# Patient Record
Sex: Male | Born: 1940 | Race: White | Hispanic: No | Marital: Married | Smoking: Former smoker
Health system: Southern US, Community
[De-identification: ages and names within clinical notes are randomized; demographics above are authoritative.]

## PROBLEM LIST (undated history)

## (undated) DIAGNOSIS — M20019 Mallet finger of unspecified finger(s): Secondary | ICD-10-CM

## (undated) DIAGNOSIS — I4892 Unspecified atrial flutter: Secondary | ICD-10-CM

## (undated) DIAGNOSIS — M2550 Pain in unspecified joint: Secondary | ICD-10-CM

## (undated) DIAGNOSIS — N200 Calculus of kidney: Secondary | ICD-10-CM

## (undated) DIAGNOSIS — R079 Chest pain, unspecified: Secondary | ICD-10-CM

## (undated) DIAGNOSIS — S36119A Unspecified injury of liver, initial encounter: Secondary | ICD-10-CM

## (undated) DIAGNOSIS — M1990 Unspecified osteoarthritis, unspecified site: Secondary | ICD-10-CM

## (undated) DIAGNOSIS — C4490 Unspecified malignant neoplasm of skin, unspecified: Secondary | ICD-10-CM

## (undated) DIAGNOSIS — I10 Essential (primary) hypertension: Secondary | ICD-10-CM

## (undated) DIAGNOSIS — G4730 Sleep apnea, unspecified: Secondary | ICD-10-CM

## (undated) DIAGNOSIS — B159 Hepatitis A without hepatic coma: Secondary | ICD-10-CM

## (undated) DIAGNOSIS — H2513 Age-related nuclear cataract, bilateral: Secondary | ICD-10-CM

## (undated) DIAGNOSIS — G2581 Restless legs syndrome: Secondary | ICD-10-CM

## (undated) DIAGNOSIS — N400 Enlarged prostate without lower urinary tract symptoms: Secondary | ICD-10-CM

## (undated) DIAGNOSIS — F1010 Alcohol abuse, uncomplicated: Secondary | ICD-10-CM

## (undated) DIAGNOSIS — K635 Polyp of colon: Secondary | ICD-10-CM

## (undated) DIAGNOSIS — D649 Anemia, unspecified: Secondary | ICD-10-CM

## (undated) HISTORY — DX: Unspecified osteoarthritis, unspecified site: M19.90

## (undated) HISTORY — PX: OTHER SURGICAL HISTORY: SHX169

## (undated) HISTORY — DX: Anemia, unspecified: D64.9

## (undated) HISTORY — DX: Benign prostatic hyperplasia without lower urinary tract symptoms: N40.0

## (undated) HISTORY — DX: Sleep apnea, unspecified: G47.30

## (undated) HISTORY — DX: Age-related nuclear cataract, bilateral: H25.13

## (undated) HISTORY — DX: Essential (primary) hypertension: I10

## (undated) HISTORY — DX: Polyp of colon: K63.5

## (undated) HISTORY — DX: Restless legs syndrome: G25.81

## (undated) HISTORY — DX: Unspecified atrial flutter: I48.92

## (undated) HISTORY — DX: Chest pain, unspecified: R07.9

## (undated) HISTORY — PX: POLYPECTOMY: SHX149

## (undated) HISTORY — DX: Mallet finger of unspecified finger(s): M20.019

## (undated) HISTORY — DX: Pain in unspecified joint: M25.50

## (undated) HISTORY — PX: WISDOM TOOTH EXTRACTION: SHX21

## (undated) HISTORY — PX: EYE SURGERY: SHX253

## (undated) HISTORY — DX: Calculus of kidney: N20.0

## (undated) HISTORY — DX: Unspecified malignant neoplasm of skin, unspecified: C44.90

## (undated) HISTORY — DX: Alcohol abuse, uncomplicated: F10.10

## (undated) HISTORY — PX: INGUINAL HERNIA REPAIR: SUR1180

---
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
## 2005-02-20 ENCOUNTER — Ambulatory Visit: Payer: Self-pay | Admitting: Internal Medicine

## 2005-03-19 ENCOUNTER — Ambulatory Visit: Payer: Self-pay | Admitting: Internal Medicine

## 2005-09-25 ENCOUNTER — Ambulatory Visit: Payer: Self-pay | Admitting: Internal Medicine

## 2006-04-02 ENCOUNTER — Ambulatory Visit: Payer: Self-pay | Admitting: Internal Medicine

## 2006-12-23 DIAGNOSIS — L719 Rosacea, unspecified: Secondary | ICD-10-CM | POA: Insufficient documentation

## 2006-12-23 DIAGNOSIS — Z860100 Personal history of colon polyps, unspecified: Secondary | ICD-10-CM

## 2006-12-23 DIAGNOSIS — M722 Plantar fascial fibromatosis: Secondary | ICD-10-CM

## 2006-12-23 DIAGNOSIS — J309 Allergic rhinitis, unspecified: Secondary | ICD-10-CM | POA: Insufficient documentation

## 2006-12-23 HISTORY — DX: Allergic rhinitis, unspecified: J30.9

## 2006-12-23 HISTORY — DX: Personal history of colonic polyps: Z86.010

## 2006-12-23 HISTORY — DX: Rosacea, unspecified: L71.9

## 2006-12-23 HISTORY — DX: Personal history of colon polyps, unspecified: Z86.0100

## 2007-07-02 ENCOUNTER — Encounter: Admission: RE | Admit: 2007-07-02 | Discharge: 2007-07-02 | Payer: Self-pay | Admitting: Orthopaedic Surgery

## 2007-07-26 ENCOUNTER — Telehealth (INDEPENDENT_AMBULATORY_CARE_PROVIDER_SITE_OTHER): Payer: Self-pay | Admitting: *Deleted

## 2007-09-21 ENCOUNTER — Ambulatory Visit: Payer: Self-pay | Admitting: Internal Medicine

## 2007-09-21 DIAGNOSIS — Z87442 Personal history of urinary calculi: Secondary | ICD-10-CM

## 2007-09-21 DIAGNOSIS — I10 Essential (primary) hypertension: Secondary | ICD-10-CM | POA: Insufficient documentation

## 2007-09-21 DIAGNOSIS — E782 Mixed hyperlipidemia: Secondary | ICD-10-CM

## 2007-09-21 HISTORY — DX: Personal history of urinary calculi: Z87.442

## 2007-09-21 HISTORY — DX: Essential (primary) hypertension: I10

## 2007-09-21 HISTORY — DX: Mixed hyperlipidemia: E78.2

## 2007-09-23 ENCOUNTER — Encounter: Payer: Self-pay | Admitting: Internal Medicine

## 2007-10-06 ENCOUNTER — Ambulatory Visit: Payer: Self-pay | Admitting: Internal Medicine

## 2007-10-12 ENCOUNTER — Encounter (INDEPENDENT_AMBULATORY_CARE_PROVIDER_SITE_OTHER): Payer: Self-pay | Admitting: *Deleted

## 2007-10-12 LAB — CONVERTED CEMR LAB
ALT: 19 units/L (ref 0–53)
AST: 20 units/L (ref 0–37)
Albumin: 3.9 g/dL (ref 3.5–5.2)
Alkaline Phosphatase: 55 units/L (ref 39–117)
Bilirubin, Direct: 0.2 mg/dL (ref 0.0–0.3)
Calcium: 9.1 mg/dL (ref 8.4–10.5)
Chloride: 110 meq/L (ref 96–112)
Cholesterol: 190 mg/dL (ref 0–200)
Creatinine, Ser: 1 mg/dL (ref 0.4–1.5)
GFR calc Af Amer: 97 mL/min
GFR calc non Af Amer: 80 mL/min
Total Bilirubin: 0.8 mg/dL (ref 0.3–1.2)
Total CHOL/HDL Ratio: 2.7
Total Protein: 6.2 g/dL (ref 6.0–8.3)
VLDL: 6 mg/dL (ref 0–40)

## 2008-03-14 ENCOUNTER — Encounter: Payer: Self-pay | Admitting: Internal Medicine

## 2008-05-08 ENCOUNTER — Telehealth (INDEPENDENT_AMBULATORY_CARE_PROVIDER_SITE_OTHER): Payer: Self-pay | Admitting: *Deleted

## 2008-10-13 ENCOUNTER — Telehealth (INDEPENDENT_AMBULATORY_CARE_PROVIDER_SITE_OTHER): Payer: Self-pay | Admitting: *Deleted

## 2008-11-09 ENCOUNTER — Ambulatory Visit: Payer: Self-pay | Admitting: Internal Medicine

## 2008-11-09 DIAGNOSIS — N400 Enlarged prostate without lower urinary tract symptoms: Secondary | ICD-10-CM

## 2008-11-09 DIAGNOSIS — K5730 Diverticulosis of large intestine without perforation or abscess without bleeding: Secondary | ICD-10-CM

## 2008-11-09 HISTORY — DX: Benign prostatic hyperplasia without lower urinary tract symptoms: N40.0

## 2008-11-09 HISTORY — DX: Diverticulosis of large intestine without perforation or abscess without bleeding: K57.30

## 2008-11-10 ENCOUNTER — Ambulatory Visit: Payer: Self-pay | Admitting: Internal Medicine

## 2008-11-20 ENCOUNTER — Encounter (INDEPENDENT_AMBULATORY_CARE_PROVIDER_SITE_OTHER): Payer: Self-pay | Admitting: *Deleted

## 2009-05-29 ENCOUNTER — Emergency Department (HOSPITAL_COMMUNITY): Admission: EM | Admit: 2009-05-29 | Discharge: 2009-05-29 | Payer: Self-pay | Admitting: Emergency Medicine

## 2010-05-06 ENCOUNTER — Telehealth (INDEPENDENT_AMBULATORY_CARE_PROVIDER_SITE_OTHER): Payer: Self-pay | Admitting: *Deleted

## 2010-05-09 ENCOUNTER — Ambulatory Visit: Payer: Self-pay | Admitting: Internal Medicine

## 2010-05-09 ENCOUNTER — Encounter: Payer: Self-pay | Admitting: Internal Medicine

## 2010-05-17 LAB — CONVERTED CEMR LAB
ALT: 18 units/L (ref 0–53)
AST: 21 units/L (ref 0–37)
Albumin: 4.4 g/dL (ref 3.5–5.2)
Alkaline Phosphatase: 61 units/L (ref 39–117)
BUN: 14 mg/dL (ref 6–23)
Calcium: 9.2 mg/dL (ref 8.4–10.5)
Creatinine, Ser: 1 mg/dL (ref 0.4–1.5)
Eosinophils Absolute: 0.3 10*3/uL (ref 0.0–0.7)
Eosinophils Relative: 3.8 % (ref 0.0–5.0)
HCT: 46.1 % (ref 39.0–52.0)
Hemoglobin: 15.7 g/dL (ref 13.0–17.0)
LDL Cholesterol: 111 mg/dL — ABNORMAL HIGH (ref 0–99)
Lymphs Abs: 1.6 10*3/uL (ref 0.7–4.0)
Monocytes Relative: 12.9 % — ABNORMAL HIGH (ref 3.0–12.0)
PSA: 0.92 ng/mL (ref 0.10–4.00)
RBC: 5.11 M/uL (ref 4.22–5.81)
TSH: 1.05 microintl units/mL (ref 0.35–5.50)
Triglycerides: 35 mg/dL (ref 0.0–149.0)
VLDL: 7 mg/dL (ref 0.0–40.0)

## 2010-07-28 DIAGNOSIS — M20019 Mallet finger of unspecified finger(s): Secondary | ICD-10-CM | POA: Insufficient documentation

## 2010-07-28 HISTORY — DX: Mallet finger of unspecified finger(s): M20.019

## 2010-08-25 LAB — CONVERTED CEMR LAB
Albumin: 4.1 g/dL (ref 3.5–5.2)
BUN: 13 mg/dL (ref 6–23)
Creatinine, Ser: 1 mg/dL (ref 0.4–1.5)
HDL: 70 mg/dL (ref >39.00)
PSA: 0.72 ng/mL (ref 0.10–4.00)
Potassium: 4 meq/L (ref 3.5–5.1)
Total CHOL/HDL Ratio: 3
VLDL: 5.4 mg/dL (ref 0.0–40.0)

## 2010-08-29 NOTE — Progress Notes (Signed)
Summary: RX/ Appointment Due  Phone Note Call from Patient Call back at [phone]   Caller: Patient Summary of Call: PT IS CALLING  FOR A 90 DAY  SUPPLY FOR RAMPRIL 10 BE PICK UP WHEN READY. Initial call taken by: Freddy Jaksch,  May 06, 2010 9:52 AM  Follow-up for Phone Call        Last Appointment 10/2008(Over a year ago), please contact patient to schedule CPX/Fasting labs. 90day supply can be given at CPX appointment Follow-up by: Shonna Chock CMA,  May 06, 2010 10:52 AM  Additional Follow-up for Phone Call Additional follow up Details #1::        LMOM.Marland KitchenMarland KitchenJerolyn Shin  May 07, 2010 4:53 PM    Additional Follow-up for Phone Call Additional follow up Details #2::    Patient here today for CPX Follow-up by: Shonna Chock CMA,  May 09, 2010 11:04 AM  [id number]

## 2010-08-29 NOTE — Assessment & Plan Note (Signed)
Summary: med refill / lab/cbs   Vital Signs:  Patient profile:   70 year old male Height:      70.25 inches Weight:      246.8 pounds BMI:     35.29 Temp:     98.3 degrees F oral Pulse rate:   72 / minute Resp:     14 per minute BP sitting:   114 / 72  (left arm) Cuff size:   large  Vitals Entered By: Shonna Chock CMA (May 09, 2010 11:09 AM) CC: CPX with fasting labs  Comments Height recorded with shoes on Patient updated Flu vaccine this season   CC:  CPX with fasting labs .  History of Present Illness:      Mr. Vonita Moss is here for med refills; he has been having positionally related( "after sleeping in semi reclining position > 3 nights to treat sinus congestion  ") burning  in anterior thighs. Hypertension Follow-Up      The patient denies lightheadedness, urinary frequency, headaches, and fatigue.  The patient denies the following associated symptoms: chest pain, chest pressure, exercise intolerance, dyspnea, palpitations, syncope, leg edema, and pedal edema.  Compliance with medications (by patient report) has been near 100%.  The patient reports that dietary compliance has been fair.  The patient reports exercising 3-4X per week.  Adjunctive measures currently used by the patient include salt restriction. BP  averages 128/75.   Current Medications (verified): 1)  Clonazepam 0.5 Mg Tabs (Clonazepam) .Marland Kitchen.. 1 By Mouth At Bedtime As Needed Needs Office Visit 2)  Altace 10 Mg  Caps (Ramipril) .... Take One Tablet Daily- Due For Cpx and Labs Now 3)  Zyrtec Allergy 10 Mg  Tabs (Cetirizine Hcl) .Marland Kitchen.. 1 By Mouth Qd 4)  Protopic 0.1 % Oint (Tacrolimus) .Marland Kitchen.. 1 Application At Bedtime 5)  Metrocream 0.75 % Crea (Metronidazole) .Marland Kitchen.. 1 Application Am 6)  Cvs Spectrarite Senior W/lycopene (Spectrum Silver) .Marland Kitchen.. 1 By Mouth Once Daily 7)  Cvs Vision Formula (Ocurite) .Marland Kitchen.. 1 By Mouth Once Daily 8)  Align  Caps (Probiotic Product) .Marland Kitchen.. 1 By Mouth Once Daily 9)  Fish Oil 1000 Mg Caps  (Omega-3 Fatty Acids) .Marland Kitchen.. 1 By Mouth Once Daily 10)  Red Yeast Rice 600 Mg Caps (Red Yeast Rice Extract) .Marland Kitchen.. 1 By Mouth Once Daily 11)  Metamucil Inulin Fiber in Food .Marland Kitchen.. 1-3 Per Day 12)  Fiber Con Cap .... 2 Once Daily  Allergies: 1)  ! Pcn  Past History:  Past Medical History: Allergic rhinitis Colonic polyps,PMH  of,2003,2006,2009, Dr Medoff(due 2014) Hypertension Nephrolithiasis, PMH  of Benign prostatic hypertrophy Diverticulosis, colon NMR Liporofile LDL 108( 1072/575), HDL 63, TG 40. LDL goal = < 130.  Past Surgical History: Colon polypectomy X3 , Dr Kinnie Scales  Inguinal herniorrhaphy bilaterally  Wisdom Teeth extraction  Family History: Father: DM,AAA, renal calculi Mother: CVA,diverticular bleed ?,HTN Siblings: bro: hepatitis C, hepatic failure, S/P transplant:; son :renal calculi; sister: peritonitis post ruptured appendix  Social History: Former Smoker: quit 1978 Alcohol use-no Regular exercise-yes: >3X/week CVE  Occupation:Financial Analyst Married  Review of Systems  The patient denies anorexia, fever, vision loss, decreased hearing, hoarseness, prolonged cough, hemoptysis, abdominal pain, melena, hematochezia, severe indigestion/heartburn, hematuria, suspicious skin lesions, depression, unusual weight change, abnormal bleeding, enlarged lymph nodes, and angioedema.         Swelling of tongure > 1 year ago X 1, ? due to vitamins.No recurrence despite contnuing ACE-I (Ramipril)   Physical Exam  General:  well-nourished; alert,appropriate and  cooperative throughout examination Head:  Normocephalic and atraumatic without obvious abnormalities. No apparent alopecia  Eyes:  No corneal or conjunctival inflammation noted. Perrla. Funduscopic exam benign, without hemorrhages, exudates or papilledema.  Ears:  External ear exam shows no significant lesions or deformities.  Otoscopic examination reveals clear canals, tympanic membranes are intact bilaterally without  bulging, retraction, inflammation or discharge. Hearing is grossly normal bilaterally. Nose:  External nasal examination shows no deformity or inflammation. Nasal mucosa are pink and moist without lesions or exudates. Mouth:  Oral mucosa and oropharynx without lesions or exudates.  Teeth in good repair. Neck:  No deformities, masses, or tenderness noted. Lungs:  Normal respiratory effort, chest expands symmetrically. Lungs are clear to auscultation, no crackles or wheezes. Heart:  Normal rate and regular rhythm. S1 and S2 normal without gallop, murmur, click, rub.S4 Abdomen:  Bowel sounds positive,abdomen soft and non-tender without masses, organomegaly or hernias noted. Rectal:   Normal sphincter tone. No rectal masses or tenderness.External hemorrhoidal tags. Genitalia:  Testes bilaterally descended without nodularity, tenderness or masses. No scrotal masses or lesions. No penis lesions or urethral discharge. L varicocele.   Prostate:  Prostate gland firm and smooth, no enlargement, nodularity, tenderness, mass, asymmetry or induration. Msk:  No deformity or scoliosis noted of thoracic or lumbar spine.   Pulses:  R and L carotid,radial,dorsalis pedis and posterior tibial pulses are full and equal bilaterally Extremities:  No clubbing, cyanosis, edema, or deformity noted with normal full range of motion of all joints.   Neurologic:  alert & oriented X3 and DTRs symmetrical and normal.   Skin:  Intact without suspicious lesions or rashes Cervical Nodes:  No lymphadenopathy noted Axillary Nodes:  No palpable lymphadenopathy Psych:  memory intact for recent and remote, normally interactive, and good eye contact.     Impression & Recommendations:  Problem # 1:  ROUTINE GENERAL MEDICAL EXAM@[email or web address]  CARE FACL (ICD-V70.0)  Orders: EKG w/ Interpretation (93000) Venipuncture (04540) TLB-Lipid Panel (80061-LIPID) TLB-BMP (Basic Metabolic Panel-BMET) (80048-METABOL) TLB-CBC Platelet -  w/Differential (85025-CBCD) TLB-Hepatic/Liver Function Pnl (80076-HEPATIC) TLB-TSH (Thyroid Stimulating Hormone) (84443-TSH) TLB-PSA (Prostate Specific Antigen) (84153-PSA)  Problem # 2:  HYPERTENSION, ESSENTIAL NOS (ICD-401.9)  His updated medication list for this problem includes:    Altace 10 Mg Caps (Ramipril) .Marland Kitchen... Take one tablet daily- due for cpx and labs now  Problem # 3:  COLONIC POLYPS, HX OF (ICD-V12.72) as per Dr Kinnie Scales  Complete Medication List: 1)  Clonazepam 0.5 Mg Tabs (Clonazepam) .Marland Kitchen.. 1 by mouth at bedtime as needed needs office visit 2)  Altace 10 Mg Caps (Ramipril) .... Take one tablet daily- due for cpx and labs now 3)  Zyrtec Allergy 10 Mg Tabs (Cetirizine hcl) .Marland Kitchen.. 1 by mouth qd 4)  Protopic 0.1 % Oint (Tacrolimus) .Marland Kitchen.. 1 application at bedtime 5)  Metrocream 0.75 % Crea (Metronidazole) .Marland Kitchen.. 1 application am 6)  Cvs Chief Strategy Officer W/lycopene (spectrum Silver)  .Marland Kitchen.. 1 by mouth once daily 7)  Cvs Vision Formula (ocurite)  .Marland Kitchen.. 1 by mouth once daily 8)  Align Caps (Probiotic product) .Marland Kitchen.. 1 by mouth once daily 9)  Fish Oil 1000 Mg Caps (Omega-3 fatty acids) .Marland Kitchen.. 1 by mouth once daily 10)  Red Yeast Rice 600 Mg Caps (Red yeast rice extract) .Marland Kitchen.. 1 by mouth once daily 11)  Metamucil Inulin Fiber in Food  .Marland Kitchen.. 1-3 per day 12)  Fiber Con Cap  .... 2 once daily  Other Orders: Pneumococcal Vaccine (98119) Admin 1st Vaccine (14782)  Patient Instructions: 1)  Check your Blood Pressure regularly. If it is above: 135/85 ON AVERAGE  you should make an appointment.   Immunizations Administered:  Pneumonia Vaccine:    Vaccine Type: Pneumovax    Site: left deltoid    Mfr: Merck    Dose: 0.5 ml    Route: IM    Given by: Shonna Chock CMA    Exp. Date: 10/14/2011    Lot #: [id number]    VIS given: 07/02/09 version given May 09, 2010.  Appended Document: med refill / lab/cbs     Allergies: 1)  ! Pcn   Complete Medication List: 1)  Clonazepam 0.5 Mg Tabs  (Clonazepam) .Marland Kitchen.. 1 by mouth at bedtime as needed 2)  Altace 10 Mg Caps (Ramipril) .... Take one tablet daily- due for cpx and labs now 3)  Zyrtec Allergy 10 Mg Tabs (Cetirizine hcl) .Marland Kitchen.. 1 by mouth qd 4)  Protopic 0.1 % Oint (Tacrolimus) .Marland Kitchen.. 1 application at bedtime 5)  Metrocream 0.75 % Crea (Metronidazole) .Marland Kitchen.. 1 application am 6)  Cvs Chief Strategy Officer W/lycopene (spectrum Silver)  .Marland Kitchen.. 1 by mouth once daily 7)  Cvs Vision Formula (ocurite)  .Marland Kitchen.. 1 by mouth once daily 8)  Align Caps (Probiotic product) .Marland Kitchen.. 1 by mouth once daily 9)  Fish Oil 1000 Mg Caps (Omega-3 fatty acids) .Marland Kitchen.. 1 by mouth once daily 10)  Red Yeast Rice 600 Mg Caps (Red yeast rice extract) .Marland Kitchen.. 1 by mouth once daily 11)  Metamucil Inulin Fiber in Food  .Marland Kitchen.. 1-3 per day 12)  Fiber Con Cap  .... 2 once daily  Other Orders: Specimen Handling (14782)

## 2010-08-29 NOTE — Progress Notes (Signed)
Summary: Med List Brought by Patient  Med List Brought by Patient   Imported By: Lanelle Bal 05/16/2010 15:46:48  _____________________________________________________________________  External Attachment:    Type:   Image     Comment:   External Document

## 2011-02-21 ENCOUNTER — Encounter: Payer: Self-pay | Admitting: Internal Medicine

## 2011-02-24 ENCOUNTER — Ambulatory Visit (INDEPENDENT_AMBULATORY_CARE_PROVIDER_SITE_OTHER): Payer: Federal, State, Local not specified - PPO | Admitting: Internal Medicine

## 2011-02-24 ENCOUNTER — Encounter: Payer: Self-pay | Admitting: Internal Medicine

## 2011-02-24 DIAGNOSIS — R1013 Epigastric pain: Secondary | ICD-10-CM

## 2011-02-24 DIAGNOSIS — Z86010 Personal history of colonic polyps: Secondary | ICD-10-CM

## 2011-02-24 DIAGNOSIS — K5730 Diverticulosis of large intestine without perforation or abscess without bleeding: Secondary | ICD-10-CM

## 2011-02-24 LAB — CBC WITH DIFFERENTIAL/PLATELET
Eosinophils Absolute: 0.2 10*3/uL (ref 0.0–0.7)
Hemoglobin: 15.1 g/dL (ref 13.0–17.0)
Lymphs Abs: 1.2 10*3/uL (ref 0.7–4.0)
MCHC: 33.8 g/dL (ref 30.0–36.0)
MCV: 89.9 fl (ref 78.0–100.0)
Monocytes Relative: 11.4 % (ref 3.0–12.0)
Platelets: 226 10*3/uL (ref 150.0–400.0)
RBC: 4.98 Mil/uL (ref 4.22–5.81)
RDW: 13.1 % (ref 11.5–14.6)

## 2011-02-24 LAB — HEPATIC FUNCTION PANEL
AST: 19 U/L (ref 0–37)
Albumin: 4.6 g/dL (ref 3.5–5.2)
Alkaline Phosphatase: 61 U/L (ref 39–117)
Bilirubin, Direct: 0.1 mg/dL (ref 0.0–0.3)
Total Protein: 7.1 g/dL (ref 6.0–8.3)

## 2011-02-24 MED ORDER — RANITIDINE HCL 150 MG PO TABS: 150.0000 mg | ORAL_TABLET | Freq: Two times a day (BID) | ORAL | Status: DC

## 2011-02-24 NOTE — Patient Instructions (Addendum)
The triggers for dyspepsia or "heart burn"  include stress; the "aspirin family" ; alcohol; peppermint; and caffeine (coffee, tea, cola, and chocolate). The aspirin family would include aspirin and the nonsteroidal agents such as ibuprofen &  Naproxen. Tylenol would not cause reflux. If having dyspepsia ; food & drink should be avoided for @ least 2 hours before going to bed. Please complete stool cards.

## 2011-02-24 NOTE — Progress Notes (Signed)
  Subjective:    Patient ID: TEXAS SOUTER, male    DOB: 18-Oct-1940, 70 y.o.   MRN: [id number]  HPI ABDOMINAL PAIN: Location: epigatric  Onset: 6  mos ago after eating pizza, followed by  diarrhea   Radiation: no  Severity:up to 5 Quality: throbbing  Duration: hours  Better with: BM  Worse with: certain spicey foods Symptoms: Nausea/Vomiting: no  Diarrhea: no, but increased BMs  Constipation: yes, but not associated with episodes  Melena/BRBPR: no  Hematemesis: no  Anorexia: no  Fever/Chills: no  Dysuria/hematuria/ pyuria no  Wt loss: yes, purposeful  EtOH use: no  NSAIDs/ASA: no   Past Surgeries: last colonoscopy 2010 : polyps & diverticuli; no upper ENDO FH: DUD in both parents; his mother's ulcer caused death (? NSAIDS related). No FH GB disease.     Review of Systems     Objective:   Physical Exam General appearance is one of good health and nourishment w/o distress.  Eyes: No conjunctival inflammation or scleral icterus is present.  Oral exam: Dental hygiene is good; lips and gums are healthy appearing.There is no oropharyngeal erythema or exudate noted.   Heart:  Slow rate and regular rhythm. S1 and S2 normal without gallop, murmur, click, rub . S4 with slurring   Lungs:Chest clear to auscultation; no wheezes, rhonchi,rales ,or rubs present.No increased work of breathing.   Abdomen: bowel sounds normal, soft and non-tender without masses, organomegaly or hernias noted.  No guarding or rebound   Skin:Warm & dry.  Intact without suspicious lesions or rashes ; no jaundice or tenting  Lymphatic: No lymphadenopathy is noted about the head, neck, axilla             Assessment & Plan:   #1 abdominal pain, epigastric, postprandial. Most likely gastric in etiology; rule out gallbladder disease. FH ulcer disease  Plan: See orders inpatient recommendations.

## 2011-03-06 ENCOUNTER — Other Ambulatory Visit: Payer: Federal, State, Local not specified - PPO

## 2011-03-06 DIAGNOSIS — Z1211 Encounter for screening for malignant neoplasm of colon: Secondary | ICD-10-CM

## 2011-03-06 LAB — HEMOCCULT GUIAC POC 1CARD (OFFICE): Card #2 Fecal Occult Blod, POC: NEGATIVE

## 2011-03-06 NOTE — Progress Notes (Signed)
Labs only

## 2011-05-11 ENCOUNTER — Other Ambulatory Visit: Payer: Self-pay | Admitting: Internal Medicine

## 2011-05-13 ENCOUNTER — Encounter: Payer: Self-pay | Admitting: Internal Medicine

## 2011-05-13 ENCOUNTER — Ambulatory Visit (INDEPENDENT_AMBULATORY_CARE_PROVIDER_SITE_OTHER): Payer: Federal, State, Local not specified - PPO | Admitting: Internal Medicine

## 2011-05-13 DIAGNOSIS — S99919A Unspecified injury of unspecified ankle, initial encounter: Secondary | ICD-10-CM

## 2011-05-13 DIAGNOSIS — J209 Acute bronchitis, unspecified: Secondary | ICD-10-CM

## 2011-05-13 DIAGNOSIS — S99911A Unspecified injury of right ankle, initial encounter: Secondary | ICD-10-CM

## 2011-05-13 DIAGNOSIS — J069 Acute upper respiratory infection, unspecified: Secondary | ICD-10-CM

## 2011-05-13 DIAGNOSIS — K219 Gastro-esophageal reflux disease without esophagitis: Secondary | ICD-10-CM

## 2011-05-13 MED ORDER — AZITHROMYCIN 250 MG PO TABS: ORAL_TABLET | ORAL | Status: AC

## 2011-05-13 MED ORDER — HYDROCODONE-HOMATROPINE 5-1.5 MG/5ML PO SYRP: 5.0000 mL | ORAL_SOLUTION | Freq: Four times a day (QID) | ORAL | Status: AC | PRN

## 2011-05-13 NOTE — Progress Notes (Signed)
  Subjective:    Patient ID: Gregory Wilkins, male    DOB: 03/05/1941, 70 y.o.   MRN: [id number]  HPI  #1 Respiratory tract infection Onset/symptoms:10/10 as ST Exposures (illness/environmental/extrinsic):none definitely Progression of symptoms:to head congestion Treatments/response:Mucinex, Zyrtec , Tessalon Perles Present symptoms: Fever/chills/sweats:no Frontal headache:no Facial pain:yes Nasal purulence:no, all " white" Sore throat:resolved Dental pain:no Lymphadenopathy:no Wheezing/shortness of breath:some wheezing Cough/sputum/hemoptysis:white, foamy Associated extrinsic/allergic symptoms:itchy eyes/ sneezing:paroxysmal sneezing Past medical history: Seasonal allergies: yes/asthma:not definitely Smoking history:quit 1984  #2 Extremity pain Location:R ankle Onset:misstep off sidewalk 8 weeks ago Pain quality:sharp, intermittent Pain severity:up to 2 Radiation:no Exacerbating factors:no  Treatment/response:RICE initially Review of systems: Constitutional: no fever, chills, sweats, change in weight  Musculoskeletal:no  muscle cramps or pain; no  joint stiffness, redness. Slight  swelling Skin:slight discoloration Neuro: no numbness and tingling Heme:some  bruising post injury  #3 Abdominal pain: NRanitidine has been benefit. He denies abdominal pain ,melena or rectal bleeding.          Review of Systems     Objective:   Physical Exam General appearance is of good health and nourishment; no acute distress or increased work of breathing is present.  No  lymphadenopathy about the head, neck, or axilla noted.   Eyes: No conjunctival inflammation or lid edema is present.   Ears:  External ear exam shows no significant lesions or deformities.  Otoscopic examination reveals clear canals, tympanic membranes are intact bilaterally without bulging, retraction, inflammation or discharge.  Nose:  External nasal examination shows no deformity or inflammation. Nasal  mucosa are pink and moist without lesions or exudates. No septal dislocation .No obstruction to airflow. Hyponasal speech  Oral exam: Dental hygiene is good; lips and gums are healthy appearing.There is no oropharyngeal erythema or exudate noted.   Slightly hoarse  Heart:  Normal rate and regular rhythm. S1 and S2 normal without gallop, murmur, click, rub .S 4 Lungs:Chest clear to auscultation; no wheezes, rhonchi,rales ,or rubs present.No increased work of breathing.   Dry cough  Abdomen: No organomegaly, masses, or tenderness  Extremities:  No cyanosis or clubbing  noted . Edema is noted at the R  ankle, greatest at the left lateral malleolar area. With range of motion he  has discomfort over the medial distal she   Skin: Warm & dry w/o jaundice or tenting.          Assessment & Plan:  #1 respiratory tract infection, atypical suggested by clear secretions. Cough is disturbing sleep  #2 significant strain of the right ankle with residual swelling and pain.  #3 esophageal reflux, improved with an H2 blocker.  Plan: See orders and recommendations.

## 2011-05-13 NOTE — Patient Instructions (Signed)
Plain Mucinex for thick secretions ;force NON dairy fluids for next 48 hrs. Use a Neti pot daily as needed for sinus congestion 

## 2011-05-16 ENCOUNTER — Other Ambulatory Visit: Payer: Self-pay | Admitting: Internal Medicine

## 2012-02-01 ENCOUNTER — Other Ambulatory Visit: Payer: Self-pay | Admitting: Internal Medicine

## 2012-02-02 NOTE — Telephone Encounter (Signed)
Patient needs to schedule a CPX  

## 2012-04-25 ENCOUNTER — Other Ambulatory Visit: Payer: Self-pay | Admitting: Internal Medicine

## 2012-04-26 NOTE — Telephone Encounter (Signed)
Please schedule an OV       KP

## 2012-04-26 NOTE — Telephone Encounter (Signed)
Refill #90; last OV 7/12. Please  schedule  follow up appt in next 60-90 days

## 2012-04-26 NOTE — Telephone Encounter (Signed)
Pt last seen 05/13/11, labs 03/06/11. Last filled #90 refill 0.    MW

## 2012-04-30 NOTE — Telephone Encounter (Signed)
Pt scheduled  

## 2012-04-30 NOTE — Telephone Encounter (Signed)
lmovm

## 2012-06-30 ENCOUNTER — Ambulatory Visit (INDEPENDENT_AMBULATORY_CARE_PROVIDER_SITE_OTHER): Payer: Federal, State, Local not specified - PPO | Admitting: Internal Medicine

## 2012-06-30 ENCOUNTER — Encounter: Payer: Self-pay | Admitting: Internal Medicine

## 2012-06-30 VITALS — BP 124/80 | HR 68 | Wt 255.4 lb

## 2012-06-30 DIAGNOSIS — Z85828 Personal history of other malignant neoplasm of skin: Secondary | ICD-10-CM | POA: Insufficient documentation

## 2012-06-30 DIAGNOSIS — G2581 Restless legs syndrome: Secondary | ICD-10-CM | POA: Insufficient documentation

## 2012-06-30 DIAGNOSIS — Z23 Encounter for immunization: Secondary | ICD-10-CM

## 2012-06-30 DIAGNOSIS — Z831 Family history of other infectious and parasitic diseases: Secondary | ICD-10-CM

## 2012-06-30 DIAGNOSIS — I10 Essential (primary) hypertension: Secondary | ICD-10-CM

## 2012-06-30 HISTORY — DX: Personal history of other malignant neoplasm of skin: Z85.828

## 2012-06-30 HISTORY — DX: Family history of other infectious and parasitic diseases: Z83.1

## 2012-06-30 LAB — BASIC METABOLIC PANEL
CO2: 24 mEq/L (ref 19–32)
GFR: 86.59 mL/min (ref >60.00)
Glucose, Bld: 93 mg/dL (ref 70–99)

## 2012-06-30 LAB — HEPATITIS C ANTIBODY: HCV Ab: NEGATIVE

## 2012-06-30 MED ORDER — RAMIPRIL 10 MG PO CAPS: 10.0000 mg | ORAL_CAPSULE | Freq: Every day | ORAL | Status: DC

## 2012-06-30 MED ORDER — CLONAZEPAM 0.5 MG PO TABS: 0.5000 mg | ORAL_TABLET | Freq: Every evening | ORAL | Status: DC | PRN

## 2012-06-30 NOTE — Assessment & Plan Note (Signed)
Symptoms are well controlled on minimal doses of clonazepam. No change indicated

## 2012-06-30 NOTE — Progress Notes (Signed)
  Subjective:    Patient ID: ERYX ZANE, male    DOB: 1940-09-15, 71 y.o.   MRN: [id number]  HPI  Blood pressure ranges 127-132 over the high 70s. He describes a minor dry cough with the ramipril. He's been compliant with the medication   Review of Systems He denies chest pain, palpitations, significant dyspnea, paroxysmal nocturnal dyspnea, edema, or claudication. He has no significant headaches, lightheadeedness or epistaxis. His restless leg syndrome is well controlled with clonazepam 0.5 mg at bedtime as needed.   His brother had hepatitis C; he is inquiring about serology testing as per the standards of care. He has no abdominal pain; Weight loss; Clay colored stools; or Coke-colored urine.    Objective:   Physical Exam He appears healthy and well-nourished; he is in no acute distress  Thyroid normal  No carotid bruits are present.  Heart rhythm and rate are normal with no significant murmurs or gallops. S4  Chest is clear with no increased work of breathing  There is no evidence of aortic aneurysm or renal artery bruits  There is no abdominal tenderness, organomegaly, masses  He has no clubbing; trace edema.   Pedal pulses are intact   DTRs WNL  No ischemic skin changes are present . No jaundice or scleral icterus noted  Neuropsychiatric exam was normal in        Assessment & Plan:

## 2012-06-30 NOTE — Assessment & Plan Note (Signed)
Blood pressure appears to be well controlled. Renal function will be checked.  He has a minor cough; this does not appear to be significant enough to warrant change to losartan

## 2012-06-30 NOTE — Patient Instructions (Addendum)
Blood Pressure Goal  Ideally is an AVERAGE < 135/85. This AVERAGE should be calculated from @ least 5-7 BP readings taken @ different times of day on different days of week. You should not respond to isolated BP readings , but rather the AVERAGE for that week . Please schedule physical in 6 months.  If you activate My Chart; the results can be released to you as soon as they populate from the lab. If you choose not to use this program; the labs have to be reviewed, copied & mailed   causing a delay in getting the results to you.

## 2012-06-30 NOTE — Assessment & Plan Note (Signed)
Hepatitis C serology

## 2012-07-17 ENCOUNTER — Ambulatory Visit (INDEPENDENT_AMBULATORY_CARE_PROVIDER_SITE_OTHER): Payer: Federal, State, Local not specified - PPO | Admitting: Emergency Medicine

## 2012-07-17 VITALS — BP 136/85 | HR 64 | Temp 98.0°F | Resp 18 | Ht 70.0 in | Wt 250.0 lb

## 2012-07-17 DIAGNOSIS — R05 Cough: Secondary | ICD-10-CM

## 2012-07-17 DIAGNOSIS — J309 Allergic rhinitis, unspecified: Secondary | ICD-10-CM

## 2012-07-17 MED ORDER — PREDNISONE 10 MG PO TABS: ORAL_TABLET | ORAL | Status: DC

## 2012-07-17 MED ORDER — BENZONATATE 100 MG PO CAPS: 100.0000 mg | ORAL_CAPSULE | Freq: Three times a day (TID) | ORAL | Status: DC | PRN

## 2012-07-17 MED ORDER — HYDROCODONE-HOMATROPINE 5-1.5 MG/5ML PO SYRP: 5.0000 mL | ORAL_SOLUTION | Freq: Three times a day (TID) | ORAL | Status: DC | PRN

## 2012-07-17 NOTE — Progress Notes (Signed)
  Subjective:    Patient ID: Gregory Wilkins, male    DOB: 02/02/1941, 71 y.o.   MRN: [id number]  HPI patient started feeling bad approximately 8 days ago. He is significant sinus congestion and facial pain. He's had a clear nasal drainage. He is on Flonase nasal spray but continues to feel significantly congested on his nasal drainage has been clear. He has not had a fever or chills    Review of Systems     Objective:   Physical Exam TMs are clear. Nose is normal except for swollen turbinates. There is no tenderness over the maxillary sinuses. The posterior pharynx is clear. Neck was supple. There is no adenopathy noted. Chest is clear to both auscultation and percussion        Assessment & Plan:  This all appears to be allergic. Patient states he responds well to cough medication and prednisone. He will continue with Flonase and antihistamines.

## 2012-07-23 ENCOUNTER — Other Ambulatory Visit: Payer: Self-pay | Admitting: Emergency Medicine

## 2012-08-01 ENCOUNTER — Other Ambulatory Visit: Payer: Self-pay | Admitting: Internal Medicine

## 2012-11-10 DIAGNOSIS — K635 Polyp of colon: Secondary | ICD-10-CM | POA: Insufficient documentation

## 2012-11-10 HISTORY — DX: Polyp of colon: K63.5

## 2012-12-13 ENCOUNTER — Encounter: Payer: Self-pay | Admitting: Internal Medicine

## 2012-12-21 ENCOUNTER — Encounter: Payer: Self-pay | Admitting: Lab

## 2012-12-22 ENCOUNTER — Encounter: Payer: Self-pay | Admitting: Internal Medicine

## 2012-12-22 ENCOUNTER — Ambulatory Visit (INDEPENDENT_AMBULATORY_CARE_PROVIDER_SITE_OTHER): Payer: Federal, State, Local not specified - PPO | Admitting: Internal Medicine

## 2012-12-22 VITALS — HR 61 | Temp 97.6°F | Resp 12 | Ht 70.03 in | Wt 255.0 lb

## 2012-12-22 DIAGNOSIS — I10 Essential (primary) hypertension: Secondary | ICD-10-CM

## 2012-12-22 DIAGNOSIS — Z0000 Encounter for general adult medical examination without abnormal findings: Secondary | ICD-10-CM

## 2012-12-22 LAB — CBC WITH DIFFERENTIAL/PLATELET
Basophils Relative: 0.8 % (ref 0.0–3.0)
Eosinophils Relative: 3.9 % (ref 0.0–5.0)
HCT: 48.2 % (ref 39.0–52.0)
Hemoglobin: 16.2 g/dL (ref 13.0–17.0)
Lymphs Abs: 1.7 10*3/uL (ref 0.7–4.0)
MCV: 88.4 fl (ref 78.0–100.0)
Monocytes Absolute: 0.8 10*3/uL (ref 0.1–1.0)
Monocytes Relative: 13.2 % — ABNORMAL HIGH (ref 3.0–12.0)
Neutro Abs: 3.5 10*3/uL (ref 1.4–7.7)
Platelets: 221 10*3/uL (ref 150.0–400.0)
RBC: 5.45 Mil/uL (ref 4.22–5.81)
WBC: 6.3 10*3/uL (ref 4.5–10.5)

## 2012-12-22 LAB — BASIC METABOLIC PANEL
BUN: 10 mg/dL (ref 6–23)
Chloride: 108 mEq/L (ref 96–112)
GFR: 82.32 mL/min (ref >60.00)
Potassium: 4 mEq/L (ref 3.5–5.1)
Sodium: 136 mEq/L (ref 135–145)

## 2012-12-22 LAB — HEPATIC FUNCTION PANEL
ALT: 16 U/L (ref 0–53)
AST: 16 U/L (ref 0–37)
Albumin: 3.9 g/dL (ref 3.5–5.2)
Total Bilirubin: 0.6 mg/dL (ref 0.3–1.2)
Total Protein: 7 g/dL (ref 6.0–8.3)

## 2012-12-22 LAB — TSH: TSH: 0.58 u[IU]/mL (ref 0.35–5.50)

## 2012-12-22 NOTE — Progress Notes (Signed)
  Subjective:    Patient ID: Gregory Wilkins, male    DOB: 12-18-1940, 72 y.o.   MRN: [id number]  HPI  [name] is here for a physical; he denies acute issues .     Review of Systems   He is on a heart healthy diet; he exercises as walking 10,000 steps/day 5 times per week without symptoms. Specifically he denies chest pain, palpitations, dyspnea, or claudication. Family history is negative for premature coronary disease. Advanced cholesterol testing reveals his LDL goal was less than 135.  His blood pressures range from 123-139/63-80. Average is 132/71, indicating excellent control      Objective:   Physical Exam Gen.:  well-nourished in appearance. Alert, appropriate and cooperative throughout exam.    Head: Normocephalic without obvious abnormalities Eyes: No corneal or conjunctival inflammation noted. Extraocular motion intact. Vision grossly normal with lenses Ears: External  ear exam reveals no significant lesions or deformities. Canals clear .TMs normal. Hearing is grossly normal bilaterally. Nose: External nasal exam reveals no deformity or inflammation. Nasal mucosa are pink and moist. No lesions or exudates noted.  Mouth: Oral mucosa and oropharynx reveal no lesions or exudates. Teeth in good repair. Neck: No deformities, masses, or tenderness noted. Range of motion &Thyroid  normal. Lungs: Normal respiratory effort; chest expands symmetrically. Lungs are clear to auscultation without rales, wheezes, or increased work of breathing. Heart: Slow rate and regular rhythm.Accentuated S 1.Normal S2. No gallop, click, or rub. No murmur. Abdomen: Bowel sounds normal; abdomen soft and nontender. No masses, organomegaly or hernias noted. Genitalia: Genitalia normal except for left varices.External hemorrhoidal tags. Prostate is normal without enlargement, asymmetry, nodularity, or induration.                                  Musculoskeletal/extremities: No deformity or scoliosis noted of  the  thoracic or lumbar spine.  No clubbing, cyanosis, edema, or significant extremity  deformity noted. Range of motion normal .Tone & strength  Normal. Joints normal . Nail health good. Able to lie down & sit up w/o help. Negative SLR bilaterally Vascular: Carotid, radial artery, dorsalis pedis and  posterior tibial pulses are full and equal. No bruits present. Neurologic: Alert and oriented x3. Deep tendon reflexes symmetrical and normal.      Skin: Intact without suspicious lesions or rashes. Lymph: No cervical, axillary, or inguinal lymphadenopathy present. Psych: Mood and affect are normal. Normally interactive                                                                                        Assessment & Plan:  #1 comprehensive physical exam; no acute findings  Plan: see Orders  & Recommendations

## 2012-12-22 NOTE — Patient Instructions (Addendum)
Please review the record and make any corrections; share this with all medical staff seen. If you note new symptoms an appointment can be scheduled through My Chart ASAP.

## 2012-12-23 ENCOUNTER — Other Ambulatory Visit: Payer: Self-pay | Admitting: Internal Medicine

## 2012-12-24 NOTE — Telephone Encounter (Signed)
Spoke with patient, patient states that this was sent to [place] in error. Patient aware that when he is due for rx he will have to sign a controlled substance contract. Patient states when the time comes for another refill he will consider another therapy that is NOT a controlled substance, patient will discuss with [facility] at the time of next refill

## 2013-05-18 ENCOUNTER — Other Ambulatory Visit: Payer: Self-pay | Admitting: Orthopedic Surgery

## 2013-05-18 ENCOUNTER — Encounter (HOSPITAL_BASED_OUTPATIENT_CLINIC_OR_DEPARTMENT_OTHER): Payer: Self-pay | Admitting: *Deleted

## 2013-05-18 ENCOUNTER — Encounter (HOSPITAL_BASED_OUTPATIENT_CLINIC_OR_DEPARTMENT_OTHER)
Admission: RE | Admit: 2013-05-18 | Discharge: 2013-05-18 | Disposition: A | Discharge status: Home / Self Care | Payer: Federal, State, Local not specified - PPO | Source: Ambulatory Visit | Attending: Orthopedic Surgery | Admitting: Orthopedic Surgery

## 2013-05-18 ENCOUNTER — Encounter: Payer: Self-pay | Admitting: Internal Medicine

## 2013-05-18 LAB — BASIC METABOLIC PANEL
BUN: 11 mg/dL (ref 6–23)
CO2: 25 mEq/L (ref 19–32)
Calcium: 9.4 mg/dL (ref 8.4–10.5)
Chloride: 103 mEq/L (ref 96–112)
Creatinine, Ser: 1.09 mg/dL (ref 0.50–1.35)
GFR calc non Af Amer: 67 mL/min — ABNORMAL LOW (ref >90)
Glucose, Bld: 88 mg/dL (ref 70–99)
Potassium: 4.5 mEq/L (ref 3.5–5.1)
Sodium: 138 mEq/L (ref 135–145)

## 2013-05-18 NOTE — Progress Notes (Signed)
05/18/13 1347  OBSTRUCTIVE SLEEP APNEA  Have you ever been diagnosed with sleep apnea through a sleep study? No  Do you snore loudly (loud enough to be heard through closed doors)?  1  Do you often feel tired, fatigued, or sleepy during the daytime? 0  Has anyone observed you stop breathing during your sleep? 0  Do you have, or are you being treated for high blood pressure? 1  BMI more than 35 kg/m2? 0  Age over 70 years old? 1  Neck circumference greater than 40 cm/18 inches? 0  Gender: 1  Obstructive Sleep Apnea Score 4  Score 4 or greater  Results sent to PCP

## 2013-05-18 NOTE — Progress Notes (Signed)
Came in for bmet-ekg done 5/14-no cardiac or resp problems

## 2013-05-19 ENCOUNTER — Ambulatory Visit (HOSPITAL_BASED_OUTPATIENT_CLINIC_OR_DEPARTMENT_OTHER): Payer: Federal, State, Local not specified - PPO | Admitting: Anesthesiology

## 2013-05-19 ENCOUNTER — Encounter (HOSPITAL_BASED_OUTPATIENT_CLINIC_OR_DEPARTMENT_OTHER)
Admission: RE | Disposition: A | Discharge status: Home / Self Care | Payer: Self-pay | Source: Ambulatory Visit | Attending: Orthopedic Surgery

## 2013-05-19 ENCOUNTER — Encounter (HOSPITAL_BASED_OUTPATIENT_CLINIC_OR_DEPARTMENT_OTHER): Payer: Federal, State, Local not specified - PPO | Admitting: Anesthesiology

## 2013-05-19 ENCOUNTER — Encounter (HOSPITAL_BASED_OUTPATIENT_CLINIC_OR_DEPARTMENT_OTHER): Payer: Self-pay | Admitting: *Deleted

## 2013-05-19 ENCOUNTER — Ambulatory Visit (HOSPITAL_BASED_OUTPATIENT_CLINIC_OR_DEPARTMENT_OTHER)
Admission: RE | Admit: 2013-05-19 | Discharge: 2013-05-19 | Disposition: A | Discharge status: Home / Self Care | Payer: Federal, State, Local not specified - PPO | Source: Ambulatory Visit | Attending: Orthopedic Surgery | Admitting: Orthopedic Surgery

## 2013-05-19 DIAGNOSIS — K5730 Diverticulosis of large intestine without perforation or abscess without bleeding: Secondary | ICD-10-CM | POA: Insufficient documentation

## 2013-05-19 DIAGNOSIS — Z860100 Personal history of colon polyps, unspecified: Secondary | ICD-10-CM | POA: Insufficient documentation

## 2013-05-19 DIAGNOSIS — Z01812 Encounter for preprocedural laboratory examination: Secondary | ICD-10-CM | POA: Insufficient documentation

## 2013-05-19 DIAGNOSIS — W010XXA Fall on same level from slipping, tripping and stumbling without subsequent striking against object, initial encounter: Secondary | ICD-10-CM | POA: Insufficient documentation

## 2013-05-19 DIAGNOSIS — Z87442 Personal history of urinary calculi: Secondary | ICD-10-CM | POA: Insufficient documentation

## 2013-05-19 DIAGNOSIS — Y9301 Activity, walking, marching and hiking: Secondary | ICD-10-CM | POA: Insufficient documentation

## 2013-05-19 DIAGNOSIS — Y9239 Other specified sports and athletic area as the place of occurrence of the external cause: Secondary | ICD-10-CM | POA: Insufficient documentation

## 2013-05-19 DIAGNOSIS — S62319A Displaced fracture of base of unspecified metacarpal bone, initial encounter for closed fracture: Secondary | ICD-10-CM | POA: Insufficient documentation

## 2013-05-19 DIAGNOSIS — J309 Allergic rhinitis, unspecified: Secondary | ICD-10-CM | POA: Insufficient documentation

## 2013-05-19 DIAGNOSIS — Y998 Other external cause status: Secondary | ICD-10-CM | POA: Insufficient documentation

## 2013-05-19 DIAGNOSIS — I10 Essential (primary) hypertension: Secondary | ICD-10-CM | POA: Insufficient documentation

## 2013-05-19 HISTORY — PX: OPEN REDUCTION INTERNAL FIXATION (ORIF) METACARPAL: SHX6234

## 2013-05-19 LAB — POCT HEMOGLOBIN-HEMACUE: Hemoglobin: 15.5 g/dL (ref 13.0–17.0)

## 2013-05-19 SURGERY — OPEN REDUCTION INTERNAL FIXATION (ORIF) METACARPAL
Anesthesia: General | Site: Hand | Laterality: Right | Wound class: Clean

## 2013-05-19 MED ORDER — DEXAMETHASONE SODIUM PHOSPHATE 4 MG/ML IJ SOLN
INTRAMUSCULAR | Status: DC | PRN
  Administered 2013-05-19: 4 mg

## 2013-05-19 MED ORDER — PROPOFOL INFUSION 10 MG/ML OPTIME
INTRAVENOUS | Status: DC | PRN
  Administered 2013-05-19: 100 ug/kg/min via INTRAVENOUS

## 2013-05-19 MED ORDER — LACTATED RINGERS IV SOLN
INTRAVENOUS | Status: DC
  Administered 2013-05-19: 20 mL/h via INTRAVENOUS
  Administered 2013-05-19 (×2): via INTRAVENOUS

## 2013-05-19 MED ORDER — MIDAZOLAM HCL 2 MG/2ML IJ SOLN
INTRAMUSCULAR | Status: AC
  Filled 2013-05-19: qty 2

## 2013-05-19 MED ORDER — EPINEPHRINE HCL 1 MG/ML IJ SOLN
INTRAMUSCULAR | Status: DC | PRN
  Administered 2013-05-19: .15 mL via SUBCUTANEOUS

## 2013-05-19 MED ORDER — ROPIVACAINE HCL 5 MG/ML IJ SOLN
INTRAMUSCULAR | Status: DC | PRN
  Administered 2013-05-19: 25 mL

## 2013-05-19 MED ORDER — HYDROMORPHONE HCL 2 MG PO TABS: 2.0000 mg | ORAL_TABLET | ORAL | Status: DC | PRN

## 2013-05-19 MED ORDER — CHLORHEXIDINE GLUCONATE 4 % EX LIQD: 60.0000 mL | Freq: Once | CUTANEOUS | Status: DC

## 2013-05-19 MED ORDER — PROPOFOL 10 MG/ML IV BOLUS
INTRAVENOUS | Status: AC
  Filled 2013-05-19: qty 40

## 2013-05-19 MED ORDER — 0.9 % SODIUM CHLORIDE (POUR BTL) OPTIME
TOPICAL | Status: DC | PRN
  Administered 2013-05-19: 200 mL

## 2013-05-19 MED ORDER — FENTANYL CITRATE 0.05 MG/ML IJ SOLN
50.0000 ug | INTRAMUSCULAR | Status: DC | PRN
  Administered 2013-05-19: 100 ug via INTRAVENOUS

## 2013-05-19 MED ORDER — DOXYCYCLINE HYCLATE 100 MG PO TABS: 100.0000 mg | ORAL_TABLET | Freq: Two times a day (BID) | ORAL | Status: DC

## 2013-05-19 MED ORDER — MIDAZOLAM HCL 2 MG/2ML IJ SOLN
1.0000 mg | INTRAMUSCULAR | Status: DC | PRN
  Administered 2013-05-19: 2 mg via INTRAVENOUS

## 2013-05-19 MED ORDER — FENTANYL CITRATE 0.05 MG/ML IJ SOLN
INTRAMUSCULAR | Status: AC
  Filled 2013-05-19: qty 2

## 2013-05-19 MED ORDER — LIDOCAINE HCL 2 % IJ SOLN
INTRAMUSCULAR | Status: AC
  Filled 2013-05-19: qty 20

## 2013-05-19 MED ORDER — ONDANSETRON HCL 4 MG/2ML IJ SOLN
INTRAMUSCULAR | Status: DC | PRN
  Administered 2013-05-19: 4 mg via INTRAVENOUS

## 2013-05-19 SURGICAL SUPPLY — 62 items
BANDAGE ADHESIVE 1X3 (GAUZE/BANDAGES/DRESSINGS) IMPLANT
BANDAGE COBAN STERILE 2 (GAUZE/BANDAGES/DRESSINGS) IMPLANT
BANDAGE ELASTIC 3 VELCRO ST LF (GAUZE/BANDAGES/DRESSINGS) ×2 IMPLANT
BANDAGE ELASTIC 4 VELCRO ST LF (GAUZE/BANDAGES/DRESSINGS) IMPLANT
BANDAGE GAUZE ELAST BULKY 4 IN (GAUZE/BANDAGES/DRESSINGS) IMPLANT
BLADE MINI RND TIP GREEN BEAV (BLADE) IMPLANT
BLADE SURG 10 STRL SS (BLADE) ×2 IMPLANT
BLADE SURG 15 STRL LF DISP TIS (BLADE) ×1 IMPLANT
BLADE SURG 15 STRL SS (BLADE) ×1
BNDG COHESIVE 1X5 TAN STRL LF (GAUZE/BANDAGES/DRESSINGS) IMPLANT
BNDG COHESIVE 3X5 TAN STRL LF (GAUZE/BANDAGES/DRESSINGS) IMPLANT
BNDG ELASTIC 2 VLCR STRL LF (GAUZE/BANDAGES/DRESSINGS) ×2 IMPLANT
BNDG ESMARK 4X9 LF (GAUZE/BANDAGES/DRESSINGS) ×2 IMPLANT
BRUSH SCRUB EZ PLAIN DRY (MISCELLANEOUS) ×2 IMPLANT
CANISTER SUCT 1200ML W/VALVE (MISCELLANEOUS) ×2 IMPLANT
CORDS BIPOLAR (ELECTRODE) ×2 IMPLANT
COVER MAYO STAND STRL (DRAPES) ×2 IMPLANT
COVER TABLE BACK 60X90 (DRAPES) ×2 IMPLANT
CUFF TOURNIQUET SINGLE 18IN (TOURNIQUET CUFF) ×2 IMPLANT
DECANTER SPIKE VIAL GLASS SM (MISCELLANEOUS) IMPLANT
DRAPE EXTREMITY T 121X128X90 (DRAPE) ×2 IMPLANT
DRAPE OEC MINIVIEW 54X84 (DRAPES) ×2 IMPLANT
DRAPE SURG 17X23 STRL (DRAPES) ×2 IMPLANT
GAUZE XEROFORM 1X8 LF (GAUZE/BANDAGES/DRESSINGS) ×4 IMPLANT
GLOVE BIOGEL M STRL SZ7.5 (GLOVE) ×4 IMPLANT
GLOVE BIOGEL PI IND STRL 8 (GLOVE) ×1 IMPLANT
GLOVE BIOGEL PI INDICATOR 8 (GLOVE) ×1
GLOVE ORTHO TXT STRL SZ7.5 (GLOVE) ×2 IMPLANT
GOWN BRE IMP PREV XXLGXLNG (GOWN DISPOSABLE) ×4 IMPLANT
GOWN PREVENTION PLUS XLARGE (GOWN DISPOSABLE) IMPLANT
GOWN PREVENTION PLUS XXLARGE (GOWN DISPOSABLE) ×2 IMPLANT
K-WIRE .045X4 (WIRE) ×6 IMPLANT
NEEDLE 27GAX1X1/2 (NEEDLE) IMPLANT
NS IRRIG 1000ML POUR BTL (IV SOLUTION) ×2 IMPLANT
PACK BASIN DAY SURGERY FS (CUSTOM PROCEDURE TRAY) ×2 IMPLANT
PAD CAST 3X4 CTTN HI CHSV (CAST SUPPLIES) ×2 IMPLANT
PAD CAST 4YDX4 CTTN HI CHSV (CAST SUPPLIES) IMPLANT
PADDING CAST ABS 4INX4YD NS (CAST SUPPLIES)
PADDING CAST ABS COTTON 4X4 ST (CAST SUPPLIES) IMPLANT
PADDING CAST COTTON 3X4 STRL (CAST SUPPLIES) ×2
PADDING CAST COTTON 4X4 STRL (CAST SUPPLIES)
PADDING UNDERCAST 2  STERILE (CAST SUPPLIES) IMPLANT
SLEEVE SCD COMPRESS KNEE MED (MISCELLANEOUS) IMPLANT
SPLINT PLASTER CAST XFAST 3X15 (CAST SUPPLIES) ×10 IMPLANT
SPLINT PLASTER XTRA FASTSET 3X (CAST SUPPLIES) ×10
SPONGE GAUZE 4X4 12PLY (GAUZE/BANDAGES/DRESSINGS) ×2 IMPLANT
STOCKINETTE 4X48 STRL (DRAPES) ×2 IMPLANT
STRIP CLOSURE SKIN 1/2X4 (GAUZE/BANDAGES/DRESSINGS) ×2 IMPLANT
SUCTION FRAZIER TIP 10 FR DISP (SUCTIONS) ×2 IMPLANT
SUT ETHILON 4 0 PS 2 18 (SUTURE) IMPLANT
SUT MERSILENE 4 0 P 3 (SUTURE) IMPLANT
SUT PROLENE 3 0 PS 2 (SUTURE) IMPLANT
SUT PROLENE 4 0 P 3 18 (SUTURE) ×2 IMPLANT
SUT VIC AB 4-0 P-3 18XBRD (SUTURE) ×1 IMPLANT
SUT VIC AB 4-0 P3 18 (SUTURE) ×1
SYR 3ML 23GX1 SAFETY (SYRINGE) IMPLANT
SYR BULB 3OZ (MISCELLANEOUS) ×2 IMPLANT
SYR CONTROL 10ML LL (SYRINGE) IMPLANT
TOWEL OR 17X24 6PK STRL BLUE (TOWEL DISPOSABLE) ×4 IMPLANT
TRAY DSU PREP LF (CUSTOM PROCEDURE TRAY) ×2 IMPLANT
TUBE CONNECTING 20X1/4 (TUBING) ×2 IMPLANT
UNDERPAD 30X30 INCONTINENT (UNDERPADS AND DIAPERS) ×2 IMPLANT

## 2013-05-19 NOTE — Progress Notes (Signed)
Assisted Dr. Crews with right, ultrasound guided, supraclavicular block. Side rails up, monitors on throughout procedure. See vital signs in flow sheet. Tolerated Procedure well. 

## 2013-05-19 NOTE — Anesthesia Preprocedure Evaluation (Addendum)
Anesthesia Evaluation  Patient identified by MRN, date of birth, ID band Patient awake    Reviewed: Allergy & Precautions, H&P , NPO status , Patient's Chart, lab work & pertinent test results  Airway Mallampati: I TM Distance: >3 FB Neck ROM: Full    Dental  (+) Teeth Intact and Dental Advisory Given   Pulmonary  breath sounds clear to auscultation        Cardiovascular hypertension, Pt. on medications Rhythm:Regular Rate:Normal     Neuro/Psych    GI/Hepatic   Endo/Other    Renal/GU      Musculoskeletal   Abdominal   Peds  Hematology   Anesthesia Other Findings   Reproductive/Obstetrics                          Anesthesia Physical Anesthesia Plan  ASA: II  Anesthesia Plan: General and MAC   Post-op Pain Management:    Induction: Intravenous  Airway Management Planned: Simple Face Mask  Additional Equipment:   Intra-op Plan:   Post-operative Plan:   Informed Consent: I have reviewed the patients History and Physical, chart, labs and discussed the procedure including the risks, benefits and alternatives for the proposed anesthesia with the patient or authorized representative who has indicated his/her understanding and acceptance.   Dental advisory given  Plan Discussed with: CRNA, Anesthesiologist and Surgeon  Anesthesia Plan Comments:        Anesthesia Quick Evaluation

## 2013-05-19 NOTE — Anesthesia Procedure Notes (Addendum)
Anesthesia Regional Block:  Supraclavicular block  Pre-Anesthetic Checklist: ,, timeout performed, Correct Patient, Correct Site, Correct Laterality, Correct Procedure, Correct Position, site marked, Risks and benefits discussed,  Surgical consent,  Pre-op evaluation,  At surgeon's request and post-op pain management  Laterality: Right and Upper  Prep: chloraprep       Needles:  Injection technique: Single-shot  Needle Type: Echogenic Stimulator Needle     Needle Length: 5cm 5 cm Needle Gauge: 21 and 21 G    Additional Needles:  Procedures: ultrasound guided (picture in chart) Supraclavicular block Narrative:  Start time: 05/19/2013 2:35 PM End time: 05/19/2013 2:44 PM Injection made incrementally with aspirations every 5 mL.  Performed by: Personally  Anesthesiologist: Sheldon Silvan  Supraclavicular block

## 2013-05-19 NOTE — Anesthesia Postprocedure Evaluation (Signed)
Anesthesia Post Note  Patient: Gregory Wilkins  Procedure(s) Performed: Procedure(s) (LRB): OPEN REDUCTION INTERNAL FIXATION  RIGHT SMALL  METACARPAL FRACTURE (Right)  Anesthesia type: general  Patient location: PACU  Post pain: Pain level controlled  Post assessment: Patient's Cardiovascular Status Stable  Last Vitals:  Filed Vitals:   05/19/13 1745  BP: 132/67  Pulse: 62  Temp:   Resp: 19    Post vital signs: Reviewed and stable  Level of consciousness: sedated  Complications: No apparent anesthesia complications

## 2013-05-19 NOTE — Brief Op Note (Signed)
05/19/2013  5:39 PM  PATIENT:  Gregory Wilkins  72 y.o. male  PRE-OPERATIVE DIAGNOSIS:  RIGHT REVERSE BENNETT'S FRACTURE  POST-OPERATIVE DIAGNOSIS:  RIGHT REVERSE BENNETT'S FRACTURE  PROCEDURE:  Procedure(s): OPEN REDUCTION INTERNAL FIXATION  RIGHT SMALL  METACARPAL FRACTURE (Right)  SURGEON:  Surgeon(s) and Role:    * Wyn Forster., MD - Primary  PHYSICIAN ASSISTANT:   ASSISTANTS: Mallory Shirk.A-C   ANESTHESIA:   general  EBL:  Total I/O In: 2000 [I.V.:2000] Out: -   BLOOD ADMINISTERED:none  DRAINS: none   LOCAL MEDICATIONS USED:  Ropivacaine plexus block  SPECIMEN:  No Specimen  DISPOSITION OF SPECIMEN:  N/A  COUNTS:  YES  TOURNIQUET:   Total Tourniquet Time Documented: Upper Arm (Right) - 64 minutes Total: Upper Arm (Right) - 64 minutes   DICTATION: .Other Dictation: Dictation Number [id number]  PLAN OF CARE: Discharge to home after PACU  PATIENT DISPOSITION:  PACU - hemodynamically stable.   Delay start of Pharmacological VTE agent (>24hrs) due to surgical blood loss or risk of bleeding: not applicable

## 2013-05-19 NOTE — Transfer of Care (Signed)
Immediate Anesthesia Transfer of Care Note  Patient: Gregory Wilkins  Procedure(s) Performed: Procedure(s): OPEN REDUCTION INTERNAL FIXATION  RIGHT SMALL  METACARPAL FRACTURE (Right)  Patient Location: PACU  Anesthesia Type:MAC combined with regional for post-op pain  Level of Consciousness: awake, alert  and oriented  Airway & Oxygen Therapy: Patient Spontanous Breathing and Patient connected to face mask oxygen  Post-op Assessment: Report given to PACU RN and Post -op Vital signs reviewed and stable  Post vital signs: Reviewed and stable  Complications: No apparent anesthesia complications

## 2013-05-19 NOTE — Op Note (Signed)
[id number] 

## 2013-05-19 NOTE — H&P (Signed)
MADEX SEALS is an 72 y.o. male.   Chief Complaint: c/o injury to his right hand sustained while hiking HPI: Patient is a 72 y/o male who apparently fell while hiking on 05-11-13 At that time he sustained an injury to his right hand small finger. This occurred while in [place]. He proceeded to a local clinic where he was seen and evaluated at [facility] in [place]. [place]. He was referred for orthopedic follow-up  Past Medical History  Diagnosis Date  . Allergic rhinitis   . 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 Hypertension   . Nephrolithiasis   . Diverticula of colon   . Mallet finger 2012      4th L DIP,Dr Oliana Gowens    Past Surgical History  Procedure Laterality Date  . Polypectomy      X 3; Dr Kinnie Scales  . Wisdom tooth extraction    . Basal cell cancer      X 3  . Inguinal hernia repair  [age]    right,left    Family History  Problem Relation Age of Onset  . Diabetes Father   . Stroke Mother 52  . Hypertension Mother   . Heart disease Neg Hx   . Cancer Neg Hx    Social History:  reports that he quit smoking about 30 years ago. He does not have any smokeless tobacco history on file. He reports that he does not drink alcohol or use illicit drugs.  Allergies:  Allergies  Allergen Reactions  . Penicillins     hives    No prescriptions prior to admission    Results for orders placed during the hospital encounter of 05/19/13 (from the past 48 hour(s))  BASIC METABOLIC PANEL     Status: Abnormal   Collection Time    05/18/13  3:00 PM      Result Value Range   Sodium 138  135 - 145 mEq/L   Potassium 4.5  3.5 - 5.1 mEq/L   Chloride 103  96 - 112 mEq/L   CO2 25  19 - 32 mEq/L   Glucose, Bld 88  70 - 99 mg/dL   BUN 11  6 - 23 mg/dL   Creatinine, Ser 1.19  0.50 - 1.35 mg/dL   Calcium 9.4  8.4 - 14.7 mg/dL   GFR calc non Af Amer 67 (*) >90 mL/min   GFR calc Af Amer 77 (*) >90 mL/min   Comment: (NOTE)     The eGFR has been calculated using the  CKD EPI equation.     This calculation has not been validated in all clinical situations.     eGFR's persistently <90 mL/min signify possible Chronic Kidney     Disease.    No results found.   Pertinent items are noted in HPI.  Height 5\' 10"  (1.778 m), weight 110.224 kg (243 lb).  General appearance: alert Head: Normocephalic, without obvious abnormality Neck: supple, symmetrical, trachea midline Resp: clear to auscultation bilaterally Cardio: regular rate and rhythm GI: normal findings: bowel sounds normal Extremities: Exam of his right hand revealed swelling and pain in the area of the right fifth metacarpal. No mal-rotation noted. Review of his x-rays revealed a reverse Bennet's fracture. N/V intact Pulses: 2+ and symmetric Skin: normal Neurologic: Grossly normal    Assessment/Plan Impression: Reverse Bennett's fracture right hand 5th metacarpal  Plan:To the OR for closed vs. Open reduction with internal fixation right 5th MC fracture.The procedure, risks,benefits and post-op course  were discussed with the patient at length and they were in agreement with the plan.  DASNOIT,Kyann Heydt J 05/19/2013, 10:10 AM  H&P documentation: 05/19/2013  -History and Physical Reviewed  -Patient has been re-examined  -No change in the plan of care  Wyn Forster, MD

## 2013-05-20 NOTE — Op Note (Signed)
NAMEMarland Kitchen  KUNTA, HILLEARY NO.:  [id number]  MEDICAL RECORD NO.:  [id number]  LOCATION:                                 FACILITY:  PHYSICIAN:  Katy Fitch. Alexandra Posadas, M.D.      DATE OF BIRTH:  DATE OF PROCEDURE:  05/19/2013 DATE OF DISCHARGE:                              OPERATIVE REPORT   PREOPERATIVE DIAGNOSIS:  Comminuted displaced reverse Bennett's fracture of right small finger metacarpal base with dorsal dislocation of metacarpal proximal metaphysis and shaft.  POSTOPERATIVE DIAGNOSIS:  Complex open reduction and internal fixation of severely comminuted articular displaced reverse Bennett's fracture, right small finger metacarpal.  OPERATION:  Open reduction and internal fixation with application of multiple Kirschner wires to obtain and maintain reduction of fracture dislocation, right small finger metacarpal fracture at carpometacarpal joint.  OPERATING SURGEON:  Katy Fitch. Royale Lennartz, M.D.  ASSISTANT:  Jonni Sanger, P.A.  ANESTHESIA:  Right ropivacaine brachial plexus block supplemented by IV sedation.  SUPERVISING ANESTHESIOLOGIST:  Sheldon Silvan, M.D.  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  While hiking in [place] in [place], he had a significant fall sustaining a very severe injury to his right hand.  He was seen at a healthcare facility in [place] where x-rays revealed a comminuted displaced fracture of his right small finger metacarpal intra- articular at the carpometacarpal joint with dorsal dislocation of the metacarpal shaft and wide displacement of the articular fracture fragments.  There were more than 3 articular fragments.  Mr. Vonita Moss was splinted in an ulnar gutter splint by the physicians in [place] and advised to seek a Hand Surgery consult.  Being familiar with our practice, he presented for evaluation on May 18, 2013.  The date of injury was May 11, 2013.  We had detailed informed consent pointing out that this fracture dislocation is quite complex and can lead to posttraumatic arthritis at the carpometacarpal joint.  We recommended anatomic open reduction and internal fixation with either K-wire or plate fixation.  Given his degree of comminution, plate fixation may not be possible.  After informed consent, he is brought to the operating room at this time.  PROCEDURE:  Carlos Levering was interviewed by Dr. Ivin Booty in the holding area and after detailed anesthesia and informed consent had a right ropivacaine plexus block placed without complication.  This led to excellent anesthesia of the right upper extremity.  Mr. Vonita Moss was transferred to room 6 of the [facility] where he was placed in supine position upon the operating table.  Following IV sedation, his right arm and hand were prepped with Betadine soap and solution, sterilely draped.  A pneumatic tourniquet was applied to the proximal right brachium.  The right hand and arm were exsanguinated with Esmarch bandage and arterial tourniquet inflated to 220 mmHg.  Procedure commenced with an attempted closed reduction.  We tried diligently to close reduce the comminuted articular fragments even using a percutaneous Kirschner wire to try to manipulate the most displaced fragment.  This was comminuted to the point where this simply was not technically possible.  Therefore,  we elected to proceed with open reduction and internal fixation.  The carpometacarpal joint was exposed by a 4-cm longitudinal incision directly over the extensor digiti minimi followed by meticulous dissection of the dorsal ulnar sensory branches, which were retracted gently.  One of the transverse carpal arch branches crossed the fracture site directly and was electrocauterized and released.  We split the periosteum and removed multiple small  comminuted fragments of the metaphyseal bone.  We meticulously reassembled the articular surface of the 5th metacarpal base with 3 main fragments being assembled and several smaller fragments being discarded.  We then used a fracture clamp to hold the fragments while three 0.045-inch Kirschner wires were placed across the fracture and across the carpometacarpal joint securing this construct in an anatomic position.  Multiple images were obtained documenting the acceptable reduction.  The wound was then irrigated followed by repair of the extensor carpi ulnaris to the periosteum closing the periosteum and tendon over the fracture site followed by repair of the skin, subcutaneous 4-0 Vicryl and intradermal 4-0 Prolene.  A compressive dressing was applied with the hand in a safe position.  Mr. Vonita Moss will be discharged with prescription for doxycycline 100 mg 1 p.o. q.8 hours x4 days as a prophylactic antibiotic also Dilaudid 2 mg 1 or 2 tablets p.o. q.4-6 hours p.r.n. pain, 30 tablets without refill.     Katy Fitch Kery Haltiwanger, M.D.     RVS/MEDQ  D:  05/19/2013  T:  05/20/2013  Job:  [id number]

## 2013-05-23 ENCOUNTER — Encounter (HOSPITAL_BASED_OUTPATIENT_CLINIC_OR_DEPARTMENT_OTHER): Payer: Self-pay | Admitting: Orthopedic Surgery

## 2013-06-02 ENCOUNTER — Other Ambulatory Visit: Payer: Self-pay

## 2013-10-03 ENCOUNTER — Other Ambulatory Visit: Payer: Self-pay | Admitting: Internal Medicine

## 2013-10-11 ENCOUNTER — Encounter: Payer: Self-pay | Admitting: Internal Medicine

## 2013-12-26 ENCOUNTER — Other Ambulatory Visit: Payer: Self-pay | Admitting: Internal Medicine

## 2014-01-03 ENCOUNTER — Other Ambulatory Visit (INDEPENDENT_AMBULATORY_CARE_PROVIDER_SITE_OTHER): Payer: Federal, State, Local not specified - PPO

## 2014-01-03 ENCOUNTER — Encounter: Payer: Self-pay | Admitting: Internal Medicine

## 2014-01-03 ENCOUNTER — Ambulatory Visit (INDEPENDENT_AMBULATORY_CARE_PROVIDER_SITE_OTHER): Payer: Federal, State, Local not specified - PPO | Admitting: Internal Medicine

## 2014-01-03 VITALS — BP 112/68 | HR 55 | Temp 98.0°F | Wt 239.4 lb

## 2014-01-03 DIAGNOSIS — G2581 Restless legs syndrome: Secondary | ICD-10-CM

## 2014-01-03 DIAGNOSIS — Z52008 Unspecified donor, other blood: Secondary | ICD-10-CM

## 2014-01-03 DIAGNOSIS — Z8669 Personal history of other diseases of the nervous system and sense organs: Secondary | ICD-10-CM

## 2014-01-03 DIAGNOSIS — Z87898 Personal history of other specified conditions: Secondary | ICD-10-CM

## 2014-01-03 HISTORY — DX: Unspecified donor, other blood: Z52.008

## 2014-01-03 LAB — CBC WITH DIFFERENTIAL/PLATELET
Basophils Absolute: 0.1 10*3/uL (ref 0.0–0.1)
Basophils Relative: 0.9 % (ref 0.0–3.0)
EOS PCT: 4.4 % (ref 0.0–5.0)
Eosinophils Absolute: 0.3 10*3/uL (ref 0.0–0.7)
HEMATOCRIT: 46.2 % (ref 39.0–52.0)
HEMOGLOBIN: 15.5 g/dL (ref 13.0–17.0)
LYMPHS ABS: 1.8 10*3/uL (ref 0.7–4.0)
Lymphocytes Relative: 27.6 % (ref 12.0–46.0)
MCHC: 33.6 g/dL (ref 30.0–36.0)
MCV: 89.2 fl (ref 78.0–100.0)
MONOS PCT: 12.9 % — AB (ref 3.0–12.0)
Monocytes Absolute: 0.8 10*3/uL (ref 0.1–1.0)
NEUTROS ABS: 3.5 10*3/uL (ref 1.4–7.7)
Neutrophils Relative %: 54.2 % (ref 43.0–77.0)
Platelets: 228 10*3/uL (ref 150.0–400.0)
RBC: 5.18 Mil/uL (ref 4.22–5.81)
RDW: 12.9 % (ref 11.5–15.5)
WBC: 6.5 10*3/uL (ref 4.0–10.5)

## 2014-01-03 LAB — IBC PANEL
Iron: 66 ug/dL (ref 42–165)
Saturation Ratios: 19.2 % — ABNORMAL LOW (ref 20.0–50.0)
Transferrin: 244.9 mg/dL (ref 212.0–360.0)

## 2014-01-03 MED ORDER — CLONAZEPAM 0.5 MG PO TABS: 0.5000 mg | ORAL_TABLET | Freq: Every evening | ORAL | Status: DC | PRN

## 2014-01-03 NOTE — Progress Notes (Signed)
HPI: Present today for FU on medication. He is on Clonazepam 0.5 mg at bedtime for RLS. He states this is well-controlled with the medication. He would like a refill.

## 2014-01-03 NOTE — Progress Notes (Signed)
   Subjective:    Patient ID: Gregory Wilkins, male    DOB: 1940-12-07, 73 y.o.   MRN: [id number]  HPI   He is here for followup on his clonazepam which he takes as needed for restless leg syndrome. He does not have to take it every night; he has significant response with the clonazepam.  He is a blood donor and donates typically twice a year. 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  Additionally prior to his hand surgery the pre op STOP Bang screening study suggested possible sleep apnea. This has not been pursued.    Review of Systems Unexplained weight loss, abdominal pain, significant dyspepsia, dysphagia, melena, rectal bleeding, or persistently small caliber stools are denied. He denies epistaxis, hemoptysis, or hematuria. He has no abnormal bruising or bleeding. He has no difficulty stopping bleeding with injury.     Objective:   Physical Exam Gen.: Healthy and well-nourished in appearance. Alert, appropriate and cooperative throughout exam.Obesity present as per CDC Guidelines Appears younger than stated age  Head: Normocephalic without obvious abnormalities  Eyes: No corneal or conjunctival inflammation noted.  Ears: External  ear exam reveals no significant lesions or deformities.   Nose: External nasal exam reveals no deformity or inflammation. Nasal mucosa are pink and moist. No lesions or exudates noted.   Mouth: Oral mucosa and oropharynx reveal no lesions or exudates. Teeth in good repair.Oropharynx not crowded. Neck: No deformities, masses, or tenderness noted. Thyroid normal. Lungs: Normal respiratory effort; chest expands symmetrically. Lungs are clear to auscultation without rales, wheezes, or increased work of breathing. Heart: Normal rate and rhythm. Normal S1 and S2. No gallop, click, or rub. No murmur. Abdomen: Bowel sounds normal; abdomen soft and nontender. No masses, organomegaly or hernias noted.                 Musculoskeletal/extremities: No deformity or scoliosis noted of  the thoracic or lumbar spine.   No clubbing, cyanosis, edema, or significant extremity  deformity noted. Range of motion normal .Tone & strength normal. Hand joints normal.  Fingernail health good. Able to lie down & sit up w/o help. Negative SLR bilaterally Vascular: Carotid, radial artery, dorsalis pedis and  posterior tibial pulses are full and equal. No bruits present. Neurologic: Alert and oriented x3. Deep tendon reflexes symmetrical and normal.  Gait normal .       Skin: Intact without suspicious lesions or rashes. Lymph: No cervical, axillary lymphadenopathy present. Psych: Mood and affect are normal. Normally interactive                                                                                        Assessment & Plan:   #1 restless leg syndrome; excellent response to clonazepam  #2 blood donor, twice a year. Rule out iron deficiency related to this.  #3 positive pre op screening test for sleep apnea  Plan: See orders.  He will be referred to pulmonary for official sleep apnea testing

## 2014-01-03 NOTE — Patient Instructions (Signed)
Your next office appointment will be determined based upon review of your pending labs . Those instructions will be transmitted to you through My Chart   Please perform isometric exercises before going to bed. Sit on side of the bed and raise up on toes to a count of 5. Then put pressure on the heels to a count of 5. Repeat this process 10 times. This will improve blood flow to the calves & help prevent cramps.

## 2014-01-03 NOTE — Progress Notes (Signed)
Pre visit review using our clinic review tool, if applicable. No additional management support is needed unless otherwise documented below in the visit note. 

## 2014-01-04 DIAGNOSIS — E669 Obesity, unspecified: Secondary | ICD-10-CM

## 2014-01-04 HISTORY — DX: Obesity, unspecified: E66.9

## 2014-02-01 ENCOUNTER — Ambulatory Visit (INDEPENDENT_AMBULATORY_CARE_PROVIDER_SITE_OTHER): Payer: Federal, State, Local not specified - PPO | Admitting: Pulmonary Disease

## 2014-02-01 ENCOUNTER — Encounter: Payer: Self-pay | Admitting: Pulmonary Disease

## 2014-02-01 VITALS — BP 120/72 | HR 52 | Temp 98.1°F | Ht 70.0 in | Wt 241.8 lb

## 2014-02-01 DIAGNOSIS — R0683 Snoring: Secondary | ICD-10-CM

## 2014-02-01 DIAGNOSIS — R0989 Other specified symptoms and signs involving the circulatory and respiratory systems: Secondary | ICD-10-CM

## 2014-02-01 DIAGNOSIS — R0609 Other forms of dyspnea: Secondary | ICD-10-CM

## 2014-02-01 HISTORY — DX: Snoring: R06.83

## 2014-02-01 NOTE — Patient Instructions (Signed)
Work on weight loss, but call if not successful or if you feel your symptoms are worsening.

## 2014-02-01 NOTE — Progress Notes (Signed)
Subjective:    Patient ID: Gregory Wilkins, male    DOB: September 30, 1940, 73 y.o.   MRN: [id number]  HPI The patient is a 72 year old male who I've been asked to see for possible obstructive sleep apnea. He has had recent hand surgery, and from his description, I suspect he had an abnormal stop bang questionnaire. The question has been raised whether he may have sleep disordered breathing. The patient has been noted to have snoring, but no one has really commented on an abnormal breathing pattern during sleep. He does not have frequent awakenings at night, although most mornings he is not fully rested when he arises. He admits that he has restless sleep, and uses a fit bit to monitor his sleep. He does have a history of restless leg syndrome, but it is well-controlled on Klonopin. He denies any inappropriate daytime sleepiness, even with quiet activities. He has only an occasional issue in the evening with reading or watching television. He has no sleepiness with driving. The patient states that his weight is down 25 pounds over the last year, and his Epworth score today is 4.   Sleep Questionnaire What time do you typically go to bed?( Between what hours) 12-1am 12-1am at 1002 on 02/01/14 by Lilli Few, CMA How long does it take you to fall asleep? 5 minutes 5 minutes at 1002 on 02/01/14 by Lilli Few, CMA How many times during the night do you wake up? 2 2 at 1002 on 02/01/14 by Lilli Few, CMA What time do you get out of bed to start your day? 0700 0700 at 1002 on 02/01/14 by Lilli Few, CMA Do you drive or operate heavy machinery in your occupation? No No at 1002 on 02/01/14 by Lilli Few, CMA How much has your weight changed (up or down) over the past two years? (In pounds) 25 lb (11.34 kg) 25 lb (11.34 kg) at 1002 on 02/01/14 by Lilli Few, CMA Have you ever had a sleep study before? No No at 1002 on 02/01/14 by Lilli Few, CMA Do you currently use CPAP? No No at 1002 on 02/01/14 by Lilli Few, CMA Do you wear oxygen at any time? No No at 1002 on 02/01/14 by Lilli Few, CMA   Review of Systems  Constitutional: Negative for fever and unexpected weight change.  HENT: Positive for congestion and sneezing. Negative for dental problem, ear pain, nosebleeds, postnasal drip, rhinorrhea, sinus pressure, sore throat and trouble swallowing.   Eyes: Negative for redness and itching.  Respiratory: Negative for cough, chest tightness, shortness of breath and wheezing.   Cardiovascular: Negative for palpitations and leg swelling.  Gastrointestinal: Negative for nausea and vomiting.  Genitourinary: Negative for dysuria.  Musculoskeletal: Negative for joint swelling.  Skin: Negative for rash.  Neurological: Negative for headaches.  Hematological: Does not bruise/bleed easily.  Psychiatric/Behavioral: Positive for dysphoric mood. The patient is not nervous/anxious.        Objective:   Physical Exam Constitutional:  Overweight male, no acute distress  HENT:  Nares patent without discharge  Oropharynx without exudate, palate and uvula are elongated.   Eyes:  Perrla, eomi, no scleral icterus  Neck:  No JVD, no TMG  Cardiovascular:  Normal rate, regular rhythm, no rubs or gallops.  2/6 sem        Intact distal pulses  Pulmonary :  Normal breath sounds, no stridor or respiratory distress   No rales, rhonchi, or wheezing  Abdominal:  Soft, nondistended, bowel sounds present.  No tenderness noted.   Musculoskeletal:  No lower extremity edema noted.  Lymph Nodes:  No cervical lymphadenopathy noted  Skin:  No cyanosis noted  Neurologic:  Alert, appropriate, moves all 4 extremities without obvious deficit.         Assessment & Plan:

## 2014-02-01 NOTE — Assessment & Plan Note (Signed)
The patient has been noted to have significant snoring, but no one has ever really commented on an abnormal breathing pattern during sleep. Although he is overweight and does have restless sleep, the rest of his history is not overly impressive for sleep disordered breathing. If he does have sleep apnea, I suspect it's more in the milder range, and therefore not a cardiovascular risk. I have offered to do a sleep study to put the issue to rest, but after a long discussion, the patient I have decided to take time to work on further weight loss. He has already lost 25 pounds.  He is to call me if he is not having further success, or if he feels that his symptoms are worsening. We can do a home sleep test at that time.

## 2014-03-28 ENCOUNTER — Other Ambulatory Visit: Payer: Self-pay | Admitting: Internal Medicine

## 2014-04-11 ENCOUNTER — Encounter: Payer: Self-pay | Admitting: Internal Medicine

## 2014-09-22 ENCOUNTER — Other Ambulatory Visit: Payer: Self-pay | Admitting: Internal Medicine

## 2014-09-22 NOTE — Telephone Encounter (Signed)
Both X 3 mos

## 2014-09-22 NOTE — Telephone Encounter (Signed)
Okay for refill on Clonazepam?

## 2014-09-22 NOTE — Telephone Encounter (Signed)
Clonazepam has been phoned to pharmacy voice line ; Ramipril has been electronically sent

## 2015-01-03 ENCOUNTER — Other Ambulatory Visit: Payer: Self-pay | Admitting: Internal Medicine

## 2015-01-03 NOTE — Telephone Encounter (Signed)
330 of each Due for follow up

## 2015-01-03 NOTE — Telephone Encounter (Signed)
Both rx clonopin and ramipril sent to pharm

## 2015-01-03 NOTE — Telephone Encounter (Signed)
Are you ok with refilling clonazepram--please advise, thanks

## 2015-04-19 ENCOUNTER — Ambulatory Visit (INDEPENDENT_AMBULATORY_CARE_PROVIDER_SITE_OTHER): Payer: Medicare Other | Admitting: Internal Medicine

## 2015-04-19 ENCOUNTER — Other Ambulatory Visit (INDEPENDENT_AMBULATORY_CARE_PROVIDER_SITE_OTHER): Payer: Medicare Other

## 2015-04-19 ENCOUNTER — Encounter: Payer: Self-pay | Admitting: Internal Medicine

## 2015-04-19 VITALS — BP 134/66 | HR 60 | Temp 97.9°F | Resp 18 | Ht 70.0 in | Wt 243.0 lb

## 2015-04-19 DIAGNOSIS — R001 Bradycardia, unspecified: Secondary | ICD-10-CM | POA: Diagnosis not present

## 2015-04-19 DIAGNOSIS — I10 Essential (primary) hypertension: Secondary | ICD-10-CM

## 2015-04-19 DIAGNOSIS — Z23 Encounter for immunization: Secondary | ICD-10-CM

## 2015-04-19 DIAGNOSIS — G2581 Restless legs syndrome: Secondary | ICD-10-CM

## 2015-04-19 DIAGNOSIS — D500 Iron deficiency anemia secondary to blood loss (chronic): Secondary | ICD-10-CM

## 2015-04-19 LAB — FERRITIN: FERRITIN: 36.2 ng/mL (ref 22.0–322.0)

## 2015-04-19 LAB — BASIC METABOLIC PANEL
BUN: 16 mg/dL (ref 6–23)
CALCIUM: 9 mg/dL (ref 8.4–10.5)
CO2: 24 meq/L (ref 19–32)
Chloride: 108 mEq/L (ref 96–112)
Creatinine, Ser: 0.88 mg/dL (ref 0.40–1.50)
GFR: 90.42 mL/min (ref >60.00)
GLUCOSE: 99 mg/dL (ref 70–99)
POTASSIUM: 4 meq/L (ref 3.5–5.1)
SODIUM: 139 meq/L (ref 135–145)

## 2015-04-19 LAB — CBC WITH DIFFERENTIAL/PLATELET
BASOS ABS: 0 10*3/uL (ref 0.0–0.1)
Basophils Relative: 0.6 % (ref 0.0–3.0)
EOS PCT: 4 % (ref 0.0–5.0)
Eosinophils Absolute: 0.3 10*3/uL (ref 0.0–0.7)
HEMATOCRIT: 45.3 % (ref 39.0–52.0)
Hemoglobin: 15.3 g/dL (ref 13.0–17.0)
LYMPHS PCT: 21 % (ref 12.0–46.0)
Lymphs Abs: 1.5 10*3/uL (ref 0.7–4.0)
MCHC: 33.7 g/dL (ref 30.0–36.0)
MCV: 89.2 fl (ref 78.0–100.0)
MONOS PCT: 12.5 % — AB (ref 3.0–12.0)
Monocytes Absolute: 0.9 10*3/uL (ref 0.1–1.0)
NEUTROS ABS: 4.5 10*3/uL (ref 1.4–7.7)
Neutrophils Relative %: 61.9 % (ref 43.0–77.0)
Platelets: 227 10*3/uL (ref 150.0–400.0)
RBC: 5.08 Mil/uL (ref 4.22–5.81)
RDW: 12.8 % (ref 11.5–15.5)
WBC: 7.2 10*3/uL (ref 4.0–10.5)

## 2015-04-19 LAB — TSH: TSH: 1.65 u[IU]/mL (ref 0.35–4.50)

## 2015-04-19 LAB — IBC PANEL
Iron: 96 ug/dL (ref 42–165)
Saturation Ratios: 27.2 % (ref 20.0–50.0)
TRANSFERRIN: 252 mg/dL (ref 212.0–360.0)

## 2015-04-19 MED ORDER — RAMIPRIL 10 MG PO CAPS: 10.0000 mg | ORAL_CAPSULE | Freq: Every day | ORAL | Status: DC

## 2015-04-19 MED ORDER — CLONAZEPAM 0.5 MG PO TABS: ORAL_TABLET | ORAL | Status: DC

## 2015-04-19 NOTE — Progress Notes (Signed)
Pre visit review using our clinic review tool, if applicable. No additional management support is needed unless otherwise documented below in the visit note. 

## 2015-04-19 NOTE — Progress Notes (Signed)
   Subjective:    Patient ID: Gregory Wilkins, male    DOB: 05-Jan-1941, 74 y.o.   MRN: [id number]  HPI The patient is here to assess status of active health conditions.  PMH, FH, & Social History reviewed & updated.No change in [place] as recorded.  He is on a heart healthy diet with no significance salt intake. He walks 45-60 minutes 5 days a week without cardio pulmonary symptoms. He's been compliant with his medicines without adverse effects. Blood pressure averages in the 130s over 70s.  His restless leg syndrome is significant does respond to clonazepam @ bedtime.  He has been a blood donor giving "double reds" every 2-3 months. He last donated blood 7/28.  His colonoscopy is up-to-date and he has no active GI symptoms.    Review of Systems Chest pain, palpitations, tachycardia, exertional dyspnea, paroxysmal nocturnal dyspnea, claudication or edema are absent. No unexplained weight loss, abdominal pain, significant dyspepsia, dysphagia, melena, rectal bleeding, or persistently small caliber stools. Dysuria, pyuria, hematuria, frequency, nocturia or polyuria are denied. Change in hair, skin, nails denied. No bowel changes of constipation or diarrhea. No intolerance to heat or cold.     Objective:   Physical Exam  Pertinent or positive findings include: Slight gynecomastia suggested. He exhibits bradycardia. Abdomen is protuberant. There is intermittent tremor of the left upper extremity. He has trace pedal edema.  General appearance :adequately nourished; in no distress.  Eyes: No conjunctival inflammation or scleral icterus is present.  Oral exam:  Lips and gums are healthy appearing.There is no oropharyngeal erythema or exudate noted. Dental hygiene is good.  Heart:   regular rhythm. S1 and S2 normal without gallop, murmur, click, rub or other extra sounds    Lungs:Chest clear to auscultation; no wheezes, rhonchi,rales ,or rubs present.No increased work of breathing.    Abdomen: bowel sounds normal, soft and non-tender without masses, organomegaly or hernias noted.  No guarding or rebound. No flank tenderness to percussion.  Vascular : all pulses equal ; no bruits present.  Skin:Warm & dry.  Intact without suspicious lesions or rashes ; no tenting or jaundice   Lymphatic: No lymphadenopathy is noted about the head, neck, axilla.   Neuro: Strength, tone & DTRs normal.     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

## 2015-04-19 NOTE — Patient Instructions (Signed)
Minimal Blood Pressure Goal= AVERAGE < 140/90;  Ideal is an AVERAGE < 135/85. This AVERAGE should be calculated from @ least 5-7 BP readings taken @ different times of day on different days of week. You should not respond to isolated BP readings , but rather the AVERAGE for that week .Please bring your  blood pressure cuff to office visits to verify that it is reliable.It  can also be checked against the blood pressure device at the pharmacy. Finger or wrist cuffs are not dependable; an arm cuff is. Your next office appointment will be determined based upon review of your pending labs .  Those written interpretation of the lab results and instructions will be transmitted to you by My Chart   Critical results will be called.   Followup as needed for any active or acute issue. Please report any significant change in your symptoms. 

## 2015-06-04 ENCOUNTER — Ambulatory Visit (INDEPENDENT_AMBULATORY_CARE_PROVIDER_SITE_OTHER): Payer: Medicare Other | Admitting: Physician Assistant

## 2015-06-04 VITALS — BP 122/72 | HR 59 | Temp 98.9°F | Resp 16 | Ht 70.0 in | Wt 232.0 lb

## 2015-06-04 DIAGNOSIS — I10 Essential (primary) hypertension: Secondary | ICD-10-CM

## 2015-06-04 DIAGNOSIS — T783XXA Angioneurotic edema, initial encounter: Secondary | ICD-10-CM | POA: Diagnosis not present

## 2015-06-04 DIAGNOSIS — N492 Inflammatory disorders of scrotum: Secondary | ICD-10-CM | POA: Diagnosis not present

## 2015-06-04 MED ORDER — DOXYCYCLINE HYCLATE 100 MG PO CAPS: 100.0000 mg | ORAL_CAPSULE | Freq: Two times a day (BID) | ORAL | Status: AC

## 2015-06-04 MED ORDER — AMLODIPINE BESYLATE 5 MG PO TABS: 5.0000 mg | ORAL_TABLET | Freq: Every day | ORAL | Status: DC

## 2015-06-04 NOTE — Patient Instructions (Addendum)
Continue daily allegra or zyrtec. You may take benadryl at night. Stop ramipril and start amlodipine daily instead. Make follow up with your PCP in 2 weeks.  Change dressing if dressing is soiled. Keep covered at all times except when showering. You may get wet in the shower but do not scrub.  Apply warm compresses/heating pad to facilitate drainage. Leave packing in place. Do not apply any ointments as this may delay healing. If an antibiotic was prescribed, take as directed until finished. Return in 48 hours for wound care.

## 2015-06-04 NOTE — Progress Notes (Signed)
[facility] [address], [place] [address] [phone]  Date:  06/04/2015   Name:  Gregory Wilkins   DOB:  1941/03/14   MRN:  [id number]  PCP:  Gregory Cobble, Wilkins    Chief Complaint: lip swollen and Cyst   History of Present Illness:  This is a 74 y.o. male with PMH HTN, HLD who is presenting with lip swelling x 1 day. Started around 11 pm last night. He did not eat anything unusual before it started. He states the skin is mildly tender and feels a little numb. No significant pain. He is not having any tongue swelling or swelling around eyes. No difficulty swallowing or breathing. Has env allergies but no food allergies. Only med allergy penicillin (causes hives). He took 2 benadryl last night and helped some. He has been on ramipril 10 mg for many years and has never had any problems. 3 weeks ago had right arm swelling and was told it was a staph infection. Was seen at another UC and given doxy and went away. PCP: Gregory Wilkins with [facility]  He is also complaining of a cyst on his scrotum x 1 year. It was hard and size of "tip of your thumb". He states he is prone to sebaceous cysts and assumed this was the cause. 2 days ago cyst went from firm to soft and has grown in size. It is painful. States over the past week he has been volunteering at polling sites and walking a lot. He thinks the cyst was rubbing against his thigh. He denies fever, chills or drainage.  Review of Systems:  Review of Systems See HPI  Patient Active Problem List   Diagnosis Date Noted  . Snoring 02/01/2014  . Obesity (BMI 30-39.9) 01/04/2014  . Blood donor 01/03/2014  . 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 RLS (restless legs syndrome) 06/30/2012  . Family history of type C viral hepatitis 06/30/2012  . DIVERTICULOSIS, COLON 11/09/2008  . BENIGN PROSTATIC HYPERTROPHY 11/09/2008  . HYPERLIPIDEMIA 09/21/2007  . HYPERTENSION, ESSENTIAL NOS 09/21/2007  . NEPHROLITHIASIS, HX OF  09/21/2007  . ALLERGIC RHINITIS 12/23/2006  . ACNE ROSACEA 12/23/2006  . COLONIC POLYPS, HX OF 12/23/2006    Prior to Admission medications   Medication Sig Start Date End Date Taking? Authorizing Provider  clonazePAM (KLONOPIN) 0.5 MG tablet TAKE 1 TABLET BY MOUTH EVERY NIGHT AT BEDTIME AS NEEDED FOR RESTLESS LEG 04/19/15  Yes Gregory Wilkins  fexofenadine (ALLEGRA) 180 MG tablet Take 180 mg by mouth daily.   Yes Historical Provider, Wilkins  mometasone (NASONEX) 50 MCG/ACT nasal spray Place 1 spray into the nose as needed.   Yes Historical Provider, Wilkins  Multiple Vitamin (MULTIVITAMIN) tablet Take 1 tablet by mouth daily.     Yes Historical Provider, Wilkins  Olopatadine HCl (PATANASE NA) Place 2 sprays into the nose as needed.   Yes Historical Provider, Wilkins  ramipril (ALTACE) 10 MG capsule Take 1 capsule (10 mg total) by mouth daily. 04/19/15  Yes Gregory Wilkins  Sulfacetamide Sodium 10 % SHAM Apply topically.   Yes Historical Provider, Wilkins  Sulfur 5 % BAR Apply topically.   Yes Historical Provider, Wilkins    Allergies  Allergen Reactions  . Penicillins     hives    Past Surgical History  Procedure Laterality Date  . Polypectomy      X 3; Gregory Earlean Shawl  . Wisdom tooth extraction    .  Basal cell cancer      X 3  . Inguinal hernia repair  45,51    right,left  . Open reduction internal fixation (orif) metacarpal Right 05/19/2013    Procedure: OPEN REDUCTION INTERNAL FIXATION  RIGHT SMALL  METACARPAL FRACTURE;  Surgeon: Gregory Wilkins., Wilkins;  Location: [facility];  Service: Orthopedics;  Laterality: Right;    Social History  Substance Use Topics  . Smoking status: Former Smoker -- 2.00 packs/day for 26 years    Types: Cigarettes    Quit date: 07/29/1982  . Smokeless tobacco: Never Used     Comment: ages [age], up to 2 ppd  . Alcohol Use: No    Family History  Problem Relation Age of Onset  . Diabetes Father   . Stroke Mother 59  . Hypertension Mother   . Heart  disease Neg Hx   . Cancer Neg Hx     Medication list has been reviewed and updated.  Physical Examination:  Physical Exam  Constitutional: He is oriented to person, place, and time. He appears well-developed and well-nourished. No distress.  HENT:  Head: Normocephalic and atraumatic.  Right Ear: Hearing normal.  Left Ear: Hearing normal.  Nose: Nose normal.  Mouth/Throat: Uvula is midline and mucous membranes are normal. No oral lesions. No dental abscesses or uvula swelling. No posterior oropharyngeal edema or posterior oropharyngeal erythema.  Right sided upper lip swelling. Swelling is general and fluid filled. No tenderness.  No other oral swelling. Very small pustule under right nostril. Mild right sided lower facial swelling, adjacent to lip.  Eyes: Conjunctivae and lids are normal. Right eye exhibits no discharge. Left eye exhibits no discharge. No scleral icterus.  No periorbital swelling  Neck: Trachea normal.  Cardiovascular: Normal rate, regular rhythm, normal heart sounds and normal pulses.   No murmur heard. Pulmonary/Chest: Effort normal and breath sounds normal. No respiratory distress. He has no wheezes. He has no rhonchi. He has no rales.  Musculoskeletal: Normal range of motion.  Lymphadenopathy:       Head (right side): No submental, no submandibular, no tonsillar, no preauricular and no posterior auricular adenopathy present.       Head (left side): No submental, no submandibular, no tonsillar, no preauricular and no posterior auricular adenopathy present.    He has no cervical adenopathy.  Neurological: He is alert and oriented to person, place, and time.  Skin: Skin is warm, dry and intact.  2 cm fluctuance with mild surrounding induration on left anterior scrotum. TTP. No drainage or significant surrounding erythema.  Psychiatric: He has a normal mood and affect. His speech is normal and behavior is normal. Thought content normal.   BP 122/72 mmHg  Pulse 59   Temp(Src) 98.9 F (37.2 C) (Oral)  Resp 16  Ht 5\' 10"  (1.778 m)  Wt 232 lb (105.235 kg)  BMI 33.29 kg/m2  SpO2 97%  Procedure: Verbal consent obtained. Skin was cleaned with alcohol and anesthetized with 3 cc 1% lido without epi. A 1 cm incision was made. A moderate amount of purulent and sebaceous material expressed. Wound cavity size 1 cm depth. Wound aggressively packed with 1/4 inch packing. Wound dressed and wound care discussed.  Assessment and Plan:  1. Angioedema, initial encounter 2. Essential hypertension Swelling characteristic of angioedema. No red flag symptoms present. Pt will stop ramipril and start amlodipine. He will follow up with PCP in 2 weeks. Counseled on medication side effects. He will continue allegra and continue  with QHS benadryl. Counseled that if swelling worsens or if develops difficulty breathing or swallowing, go to ED asap. - amLODipine (NORVASC) 5 MG tablet; Take 1 tablet (5 mg total) by mouth daily.  Dispense: 30 tablet; Refill: 2  Discussed case with Gregory. Lorelei Pont.  3. Scrotal abscess I&D revealed mod purulence and sebaceous material. Wound packed. Counseled on wound care. Started on doxy - he has taken before and tolerated well. He will return in 48 hours for wound care. - doxycycline (VIBRAMYCIN) 100 MG capsule; Take 1 capsule (100 mg total) by mouth 2 (two) times daily. AVOID EXCESS SUN EXPOSURE WHILE ON THIS MEDICATION  Dispense: 14 capsule; Refill: 0   Benjaman Pott. Drenda Freeze, MHS [facility]  06/04/2015

## 2015-06-06 ENCOUNTER — Ambulatory Visit (INDEPENDENT_AMBULATORY_CARE_PROVIDER_SITE_OTHER): Payer: Medicare Other | Admitting: Family Medicine

## 2015-06-06 VITALS — BP 118/70 | HR 70 | Temp 98.0°F | Resp 17 | Ht 70.5 in | Wt 233.0 lb

## 2015-06-06 DIAGNOSIS — N492 Inflammatory disorders of scrotum: Secondary | ICD-10-CM

## 2015-06-06 NOTE — Progress Notes (Signed)
Procedure Consent obtained. Wound irrigated and explored. Necrotic tissue removed. No purulence expressed. 1/4 plain packing and clean dressing placed.

## 2015-06-06 NOTE — Progress Notes (Addendum)
Subjective:  This chart was scribed for Merri Ray, MD by Moises Blood, Medical Scribe. This patient was seen in room 8 and the patient's care was started 8:40 AM.   Patient ID: Gregory Wilkins, male    DOB: Oct 23, 1940, 74 y.o.   MRN: [id number]  HPI Gregory Wilkins is a 74 y.o. male Here for follow up. Seen 2 days ago by Bennett Scrape, with scrotal abscess. He had a I&D with 1cm incision, 1cm depth wound cavity with purulent expressed packed with 1/4 inch packing. Started on doxycycline.   He has not pulled off the packing. He's changed the dressing. He denies any change. He denies complications with doxycycline. He denies fever, chills, fatigue, dizziness, headaches, lightheadedness.   He was in the [employer]. 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   Patient Active Problem List   Diagnosis Date Noted  . Snoring 02/01/2014  . Obesity (BMI 30-39.9) 01/04/2014  . Blood donor 01/03/2014  . 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 RLS (restless legs syndrome) 06/30/2012  . Family history of type C viral hepatitis 06/30/2012  . DIVERTICULOSIS, COLON 11/09/2008  . BENIGN PROSTATIC HYPERTROPHY 11/09/2008  . HYPERLIPIDEMIA 09/21/2007  . HYPERTENSION, ESSENTIAL NOS 09/21/2007  . NEPHROLITHIASIS, HX OF 09/21/2007  . ALLERGIC RHINITIS 12/23/2006  . ACNE ROSACEA 12/23/2006  . COLONIC POLYPS, HX OF 12/23/2006   Past Medical History  Diagnosis Date  . Allergic rhinitis   . 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 Hypertension   . Nephrolithiasis   . Diverticula of colon   . Mallet finger 2012      4th L DIP,Dr Sypher   Past Surgical History  Procedure Laterality Date  . Polypectomy      X 3; Dr Earlean Shawl  . Wisdom tooth extraction    . Basal cell cancer      X 3  . Inguinal hernia repair  45,51    right,left  . Open reduction internal fixation (orif) metacarpal Right 05/19/2013    Procedure: OPEN REDUCTION INTERNAL FIXATION  RIGHT SMALL  METACARPAL  FRACTURE;  Surgeon: Cammie Sickle., MD;  Location: [facility];  Service: Orthopedics;  Laterality: Right;   Allergies  Allergen Reactions  . Penicillins     hives   Prior to Admission medications   Medication Sig Start Date End Date Taking? Authorizing Provider  amLODipine (NORVASC) 5 MG tablet Take 1 tablet (5 mg total) by mouth daily. 06/04/15  Yes Bennett Scrape V, PA-C  doxycycline (VIBRAMYCIN) 100 MG capsule Take 1 capsule (100 mg total) by mouth 2 (two) times daily. AVOID EXCESS SUN EXPOSURE WHILE ON THIS MEDICATION 06/04/15 06/11/15 Yes Nicole Bush V, PA-C  mometasone (NASONEX) 50 MCG/ACT nasal spray Place 1 spray into the nose as needed.   Yes Historical Provider, MD  Multiple Vitamin (MULTIVITAMIN) tablet Take 1 tablet by mouth daily.     Yes Historical Provider, MD  Olopatadine HCl (PATANASE NA) Place 2 sprays into the nose as needed.   Yes Historical Provider, MD  Sulfacetamide Sodium 10 % SHAM Apply topically.   Yes Historical Provider, MD  Sulfur 5 % BAR Apply topically.   Yes Historical Provider, MD  clonazePAM (KLONOPIN) 0.5 MG tablet TAKE 1 TABLET BY MOUTH EVERY NIGHT AT BEDTIME AS NEEDED FOR RESTLESS LEG Patient not taking: Reported on 06/06/2015 04/19/15   Hendricks Limes, MD  fexofenadine (ALLEGRA) 180 MG tablet Take 180 mg by mouth  daily.    Historical Provider, MD   Social History   Social History  . Marital Status: Married    Spouse Name: N/A  . Number of Children: N/A  . Years of Education: N/A   Occupational History  . Designer, jewellery    Social History Main Topics  . Smoking status: Former Smoker -- 2.00 packs/day for 26 years    Types: Cigarettes    Quit date: 07/29/1982  . Smokeless tobacco: Never Used     Comment: ages [age], up to 2 ppd  . Alcohol Use: No  . Drug Use: No  . Sexual Activity: Not on file   Other Topics Concern  . Not on file   Social History Narrative    Review of Systems  Constitutional: Negative for fever,  chills and fatigue.  Gastrointestinal: Negative for nausea, vomiting and diarrhea.  Genitourinary: Negative for testicular pain.  Skin: Positive for rash.  Neurological: Negative for dizziness, light-headedness and headaches.       Objective:   Physical Exam  Constitutional: He is oriented to person, place, and time. He appears well-developed and well-nourished. No distress.  HENT:  Head: Normocephalic and atraumatic.  Eyes: EOM are normal. Pupils are equal, round, and reactive to light.  Neck: Neck supple.  Cardiovascular: Normal rate.   Pulmonary/Chest: Effort normal. No respiratory distress.  Genitourinary:  Left scrotum: indurated area with packing centrally, surrounding skin without much erythema or discharge; packing removed about 5-6 cm packing, unable to completely visualize base, no significant exudate, no bleeding  Musculoskeletal: Normal range of motion.  Neurological: He is alert and oriented to person, place, and time.  Skin: Skin is warm and dry.  Psychiatric: He has a normal mood and affect. His behavior is normal.  Nursing note and vitals reviewed.   Filed Vitals:   06/06/15 0830  BP: 118/70  Pulse: 70  Temp: 98 F (36.7 C)  TempSrc: Oral  Resp: 17  Height: 5' 10.5" (1.791 m)  Weight: 233 lb (105.688 kg)  SpO2: 97%       Assessment & Plan:  Gregory Wilkins is a 74 y.o. male Scrotal abscess  - stable. Packing removed, still fairly deep cavity - will repack, continue doxycycline, and recheck in 3 days. As base is closer to opening, can leave out packing to heal from below. rtc precautions.   No orders of the defined types were placed in this encounter.   Patient Instructions  Continue packing with recheck in 3 days. Warm compresses on occasion may help.  Continue doxycycline twice per day.  Return to the clinic or go to the nearest emergency room if any of your symptoms worsen or new symptoms occur.  Abscess An abscess is an infected area that  contains a collection of pus and debris.It can occur in almost any part of the body. An abscess is also known as a furuncle or boil. CAUSES  An abscess occurs when tissue gets infected. This can occur from blockage of oil or sweat glands, infection of hair follicles, or a minor injury to the skin. As the body tries to fight the infection, pus collects in the area and creates pressure under the skin. This pressure causes pain. People with weakened immune systems have difficulty fighting infections and get certain abscesses more often.  SYMPTOMS Usually an abscess develops on the skin and becomes a painful mass that is red, warm, and tender. If the abscess forms under the skin, you may feel a moveable soft  area under the skin. Some abscesses break open (rupture) on their own, but most will continue to get worse without care. The infection can spread deeper into the body and eventually into the bloodstream, causing you to feel ill.  DIAGNOSIS  Your caregiver will take your medical history and perform a physical exam. A sample of fluid may also be taken from the abscess to determine what is causing your infection. TREATMENT  Your caregiver may prescribe antibiotic medicines to fight the infection. However, taking antibiotics alone usually does not cure an abscess. Your caregiver may need to make a small cut (incision) in the abscess to drain the pus. In some cases, gauze is packed into the abscess to reduce pain and to continue draining the area. HOME CARE INSTRUCTIONS   Only take over-the-counter or prescription medicines for pain, discomfort, or fever as directed by your caregiver.  If you were prescribed antibiotics, take them as directed. Finish them even if you start to feel better.  If gauze is used, follow your caregiver's directions for changing the gauze.  To avoid spreading the infection:  Keep your draining abscess covered with a bandage.  Wash your hands well.  Do not share personal  care items, towels, or whirlpools with others.  Avoid skin contact with others.  Keep your skin and clothes clean around the abscess.  Keep all follow-up appointments as directed by your caregiver. SEEK MEDICAL CARE IF:   You have increased pain, swelling, redness, fluid drainage, or bleeding.  You have muscle aches, chills, or a general ill feeling.  You have a fever. MAKE SURE YOU:   Understand these instructions.  Will watch your condition.  Will get help right away if you are not doing well or get worse.   This information is not intended to replace advice given to you by your health care provider. Make sure you discuss any questions you have with your health care provider.   Document Released: 04/23/2005 Document Revised: 01/13/2012 Document Reviewed: 09/26/2011 Elsevier Interactive Patient Education [employer].        By signing my name below, I, Moises Blood, attest that this documentation has been prepared under the direction and in the presence of Merri Ray, MD. Electronically Signed: Moises Blood, [place]. 06/06/2015 , 8:40 AM .  I personally performed the services described in this documentation, which was scribed in my presence. The recorded information has been reviewed and considered, and addended by me as needed.

## 2015-06-06 NOTE — Patient Instructions (Signed)
Continue packing with recheck in 3 days. Warm compresses on occasion may help.  Continue doxycycline twice per day.  Return to the clinic or go to the nearest emergency room if any of your symptoms worsen or new symptoms occur.  Abscess An abscess is an infected area that contains a collection of pus and debris.It can occur in almost any part of the body. An abscess is also known as a furuncle or boil. CAUSES  An abscess occurs when tissue gets infected. This can occur from blockage of oil or sweat glands, infection of hair follicles, or a minor injury to the skin. As the body tries to fight the infection, pus collects in the area and creates pressure under the skin. This pressure causes pain. People with weakened immune systems have difficulty fighting infections and get certain abscesses more often.  SYMPTOMS Usually an abscess develops on the skin and becomes a painful mass that is red, warm, and tender. If the abscess forms under the skin, you may feel a moveable soft area under the skin. Some abscesses break open (rupture) on their own, but most will continue to get worse without care. The infection can spread deeper into the body and eventually into the bloodstream, causing you to feel ill.  DIAGNOSIS  Your caregiver will take your medical history and perform a physical exam. A sample of fluid may also be taken from the abscess to determine what is causing your infection. TREATMENT  Your caregiver may prescribe antibiotic medicines to fight the infection. However, taking antibiotics alone usually does not cure an abscess. Your caregiver may need to make a small cut (incision) in the abscess to drain the pus. In some cases, gauze is packed into the abscess to reduce pain and to continue draining the area. HOME CARE INSTRUCTIONS   Only take over-the-counter or prescription medicines for pain, discomfort, or fever as directed by your caregiver.  If you were prescribed antibiotics, take them as  directed. Finish them even if you start to feel better.  If gauze is used, follow your caregiver's directions for changing the gauze.  To avoid spreading the infection:  Keep your draining abscess covered with a bandage.  Wash your hands well.  Do not share personal care items, towels, or whirlpools with others.  Avoid skin contact with others.  Keep your skin and clothes clean around the abscess.  Keep all follow-up appointments as directed by your caregiver. SEEK MEDICAL CARE IF:   You have increased pain, swelling, redness, fluid drainage, or bleeding.  You have muscle aches, chills, or a general ill feeling.  You have a fever. MAKE SURE YOU:   Understand these instructions.  Will watch your condition.  Will get help right away if you are not doing well or get worse.   This information is not intended to replace advice given to you by your health care provider. Make sure you discuss any questions you have with your health care provider.   Document Released: 04/23/2005 Document Revised: 01/13/2012 Document Reviewed: 09/26/2011 Elsevier Interactive Patient Education [employer].

## 2015-06-09 ENCOUNTER — Ambulatory Visit (INDEPENDENT_AMBULATORY_CARE_PROVIDER_SITE_OTHER): Payer: Medicare Other | Admitting: Urgent Care

## 2015-06-09 VITALS — BP 126/80 | HR 61 | Temp 98.2°F | Resp 18

## 2015-06-09 DIAGNOSIS — N492 Inflammatory disorders of scrotum: Secondary | ICD-10-CM

## 2015-06-09 NOTE — Progress Notes (Signed)
    MRN: [id number] DOB: 1941/05/18  Subjective:   Gregory Wilkins is a 74 y.o. male presenting for follow up on scrotal abscess. Patient was seen on 06/04/2015, had I&D performed, has been back for wound care 06/06/2015. Denies fever, pain, drainage of pus or bleeding.  Denies any other aggravating or relieving factors, no other questions or concerns.  [name] has a current medication list which includes the following prescription(s): amlodipine, doxycycline, fexofenadine, mometasone, multivitamin, sulfacetamide sodium, sulfur, clonazepam, and olopatadine hcl. Also is allergic to penicillins.  [name]  has a past medical history of Allergic rhinitis; Colonic polyp (11/10/12); Hypertension; Nephrolithiasis; Diverticula of colon; and Mallet finger (2012). Also  has past surgical history that includes Polypectomy; Wisdom tooth extraction; basal cell cancer; Inguinal hernia repair (45,51); and Open reduction internal fixation (orif) metacarpal (Right, 05/19/2013).  Objective:   Vitals: BP 126/80 mmHg  Pulse 61  Temp(Src) 98.2 F (36.8 C) (Oral)  Resp 18  SpO2 98%  Physical Exam  Constitutional: He is oriented to person, place, and time. He appears well-developed and well-nourished.  Cardiovascular: Normal rate.   Pulmonary/Chest: Effort normal.  Genitourinary:     Neurological: He is alert and oriented to person, place, and time.  Skin: Skin is warm and dry. No rash noted. No erythema. No pallor.  Psychiatric: He has a normal mood and affect.   WOUND CARE: scrotal abscess s/p I&D Dressing removed, packing removed with slight purulent material. Wound irrigated with ~10cc of sterile water. Wound packed very loosely. Non-adherent dressing and Med-fix applied.  Assessment and Plan :   1. Scrotal abscess - Healing very-well, f/u in 2 days, I anticipate that patient will no longer need packing at that point.   Jaynee Eagles, PA-C [facility] [phone] 06/09/2015 11:34 AM

## 2015-06-09 NOTE — Patient Instructions (Signed)
-   If your packing falls out, do no replace it. Just keep a dressing over the wound. - You may take ibuprofen or Tylenol for inflammation of the surrounding tissue.   Incision and Drainage, Care After Refer to this sheet in the next few weeks. These instructions provide you with information on caring for yourself after your procedure. Your caregiver may also give you more specific instructions. Your treatment has been planned according to current medical practices, but problems sometimes occur. Call your caregiver if you have any problems or questions after your procedure. HOME CARE INSTRUCTIONS   If antibiotic medicine is given, take it as directed. Finish it even if you start to feel better.  Only take over-the-counter or prescription medicines for pain, discomfort, or fever as directed by your caregiver.  Keep all follow-up appointments as directed by your caregiver.  Change any bandages (dressings) as directed by your caregiver. Replace old dressings with clean dressings.  Wash your hands before and after caring for your wound. You will receive specific instructions for cleansing and caring for your wound.  SEEK MEDICAL CARE IF:   You have increased pain, swelling, or redness around the wound.  You have increased drainage, smell, or bleeding from the wound.  You have muscle aches, chills, or you feel generally sick.  You have a fever. MAKE SURE YOU:   Understand these instructions.  Will watch your condition.  Will get help right away if you are not doing well or get worse.   This information is not intended to replace advice given to you by your health care provider. Make sure you discuss any questions you have with your health care provider.   Document Released: 10/06/2011 Document Revised: 08/04/2014 Document Reviewed: 10/06/2011 Elsevier Interactive Patient Education [employer].

## 2015-06-11 ENCOUNTER — Ambulatory Visit (INDEPENDENT_AMBULATORY_CARE_PROVIDER_SITE_OTHER): Payer: Medicare Other | Admitting: Physician Assistant

## 2015-06-11 VITALS — BP 122/74 | HR 62 | Temp 97.9°F | Resp 16 | Ht 70.5 in | Wt 238.4 lb

## 2015-06-11 DIAGNOSIS — N492 Inflammatory disorders of scrotum: Secondary | ICD-10-CM | POA: Diagnosis not present

## 2015-06-11 NOTE — Progress Notes (Signed)
[facility] [address] [place] [address] [phone]  Date:  06/11/2015   Name:  Gregory Wilkins   DOB:  01/17/1941   MRN:  [id number]  PCP:  Unice Cobble, MD    Chief Complaint: Wound Check   History of Present Illness:  This is a 74 y.o. male who is presenting for scrotal abscess wound care. He underwent I&D 11/7. This is his 3rd recheck. He has been leaving packing in placed but changing dressing daily. Doing well on doxy. He denies fever, chills, excessive drainage or significant pain.  Review of Systems:  Review of Systems See HPI  Patient Active Problem List   Diagnosis Date Noted  . Snoring 02/01/2014  . Obesity (BMI 30-39.9) 01/04/2014  . Blood donor 01/03/2014  . 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 RLS (restless legs syndrome) 06/30/2012  . Family history of type C viral hepatitis 06/30/2012  . DIVERTICULOSIS, COLON 11/09/2008  . BENIGN PROSTATIC HYPERTROPHY 11/09/2008  . HYPERLIPIDEMIA 09/21/2007  . HYPERTENSION, ESSENTIAL NOS 09/21/2007  . NEPHROLITHIASIS, HX OF 09/21/2007  . ALLERGIC RHINITIS 12/23/2006  . ACNE ROSACEA 12/23/2006  . COLONIC POLYPS, HX OF 12/23/2006    Prior to Admission medications   Medication Sig Start Date End Date Taking? Authorizing Provider  amLODipine (NORVASC) 5 MG tablet Take 1 tablet (5 mg total) by mouth daily. 06/04/15  Yes Bennett Scrape V, PA-C  doxycycline (VIBRAMYCIN) 100 MG capsule Take 1 capsule (100 mg total) by mouth 2 (two) times daily. AVOID EXCESS SUN EXPOSURE WHILE ON THIS MEDICATION 06/04/15 06/11/15 Yes Bennett Scrape V, PA-C  fexofenadine (ALLEGRA) 180 MG tablet Take 180 mg by mouth daily.   Yes Historical Provider, MD  mometasone (NASONEX) 50 MCG/ACT nasal spray Place 1 spray into the nose as needed.   Yes Historical Provider, MD  Multiple Vitamin (MULTIVITAMIN) tablet Take 1 tablet by mouth daily.     Yes Historical Provider, MD  Sulfacetamide Sodium 10 % SHAM Apply topically.   Yes  Historical Provider, MD  Sulfur 5 % BAR Apply topically.   Yes Historical Provider, MD  clonazePAM (KLONOPIN) 0.5 MG tablet TAKE 1 TABLET BY MOUTH EVERY NIGHT AT BEDTIME AS NEEDED FOR RESTLESS LEG Patient not taking: Reported on 06/06/2015 04/19/15   Hendricks Limes, MD  Olopatadine HCl (PATANASE NA) Place 2 sprays into the nose as needed.    Historical Provider, MD    Allergies  Allergen Reactions  . Penicillins     hives    Past Surgical History  Procedure Laterality Date  . Polypectomy      X 3; Dr Earlean Shawl  . Wisdom tooth extraction    . Basal cell cancer      X 3  . Inguinal hernia repair  45,51    right,left  . Open reduction internal fixation (orif) metacarpal Right 05/19/2013    Procedure: OPEN REDUCTION INTERNAL FIXATION  RIGHT SMALL  METACARPAL FRACTURE;  Surgeon: Cammie Sickle., MD;  Location: [facility];  Service: Orthopedics;  Laterality: Right;    Social History  Substance Use Topics  . Smoking status: Former Smoker -- 2.00 packs/day for 26 years    Types: Cigarettes    Quit date: 07/29/1982  . Smokeless tobacco: Never Used     Comment: ages [age], up to 2 ppd  . Alcohol Use: No    Family History  Problem Relation Age of Onset  . Diabetes Father   .  Stroke Mother 65  . Hypertension Mother   . Heart disease Neg Hx   . Cancer Neg Hx     Medication list has been reviewed and updated.  Physical Examination:  Physical Exam  Constitutional: He is oriented to person, place, and time. He appears well-developed and well-nourished. No distress.  HENT:  Head: Normocephalic and atraumatic.  Right Ear: Hearing normal.  Left Ear: Hearing normal.  Nose: Nose normal.  Eyes: Conjunctivae and lids are normal. Right eye exhibits no discharge. Left eye exhibits no discharge. No scleral icterus.  Pulmonary/Chest: Effort normal. No respiratory distress.  Musculoskeletal: Normal range of motion.  Neurological: He is alert and oriented to person,  place, and time.  Skin: Skin is warm and dry.  Dressing and packing removed.  Location: left side scrotum Incision size: 0.5 cm Wound depth: 0.75 cm Wound irrigated with 3 cc 2% lidocaine without epi No additional purulence. 1/4 inch packing loosely placed. Dressing replaced.   Psychiatric: He has a normal mood and affect. His speech is normal and behavior is normal. Thought content normal.    BP 122/74 mmHg  Pulse 62  Temp(Src) 97.9 F (36.6 C) (Oral)  Resp 16  Ht 5' 10.5" (1.791 m)  Wt 238 lb 6.4 oz (108.138 kg)  BMI 33.71 kg/m2  SpO2 96%  Assessment and Plan:  1. Scrotal abscess Healing very well. No additional purulence. Wound care provided. Loosely repacked. He will pull out completely in 48 hours. No need to return unless has further problems. Finish abx.   Benjaman Pott Drenda Freeze, MHS [facility]  06/11/2015

## 2015-08-22 ENCOUNTER — Other Ambulatory Visit: Payer: Self-pay | Admitting: Physician Assistant

## 2015-11-23 ENCOUNTER — Other Ambulatory Visit: Payer: Self-pay | Admitting: Physician Assistant

## 2015-12-22 ENCOUNTER — Other Ambulatory Visit: Payer: Self-pay | Admitting: Physician Assistant

## 2016-10-27 ENCOUNTER — Ambulatory Visit (INDEPENDENT_AMBULATORY_CARE_PROVIDER_SITE_OTHER): Payer: Medicare Other | Admitting: Psychology

## 2016-10-27 DIAGNOSIS — F4322 Adjustment disorder with anxiety: Secondary | ICD-10-CM

## 2016-11-17 ENCOUNTER — Ambulatory Visit (INDEPENDENT_AMBULATORY_CARE_PROVIDER_SITE_OTHER): Payer: Medicare Other | Admitting: Psychology

## 2016-11-17 DIAGNOSIS — F4322 Adjustment disorder with anxiety: Secondary | ICD-10-CM | POA: Diagnosis not present

## 2016-12-08 ENCOUNTER — Ambulatory Visit (INDEPENDENT_AMBULATORY_CARE_PROVIDER_SITE_OTHER): Payer: Medicare Other | Admitting: Psychology

## 2016-12-08 DIAGNOSIS — F4322 Adjustment disorder with anxiety: Secondary | ICD-10-CM

## 2016-12-24 ENCOUNTER — Ambulatory Visit (INDEPENDENT_AMBULATORY_CARE_PROVIDER_SITE_OTHER): Payer: Medicare Other | Admitting: Psychology

## 2016-12-24 DIAGNOSIS — F4322 Adjustment disorder with anxiety: Secondary | ICD-10-CM

## 2017-01-13 ENCOUNTER — Ambulatory Visit (INDEPENDENT_AMBULATORY_CARE_PROVIDER_SITE_OTHER): Payer: Medicare Other | Admitting: Psychology

## 2017-01-13 DIAGNOSIS — F4322 Adjustment disorder with anxiety: Secondary | ICD-10-CM

## 2017-01-27 ENCOUNTER — Ambulatory Visit (INDEPENDENT_AMBULATORY_CARE_PROVIDER_SITE_OTHER): Payer: Medicare Other | Admitting: Psychology

## 2017-01-27 DIAGNOSIS — F4322 Adjustment disorder with anxiety: Secondary | ICD-10-CM | POA: Diagnosis not present

## 2017-02-12 ENCOUNTER — Ambulatory Visit (INDEPENDENT_AMBULATORY_CARE_PROVIDER_SITE_OTHER): Payer: Medicare Other | Admitting: Psychology

## 2017-02-12 DIAGNOSIS — F4322 Adjustment disorder with anxiety: Secondary | ICD-10-CM

## 2017-02-26 ENCOUNTER — Ambulatory Visit (INDEPENDENT_AMBULATORY_CARE_PROVIDER_SITE_OTHER): Payer: Medicare Other | Admitting: Psychology

## 2017-02-26 DIAGNOSIS — F4322 Adjustment disorder with anxiety: Secondary | ICD-10-CM | POA: Diagnosis not present

## 2017-03-12 ENCOUNTER — Ambulatory Visit (INDEPENDENT_AMBULATORY_CARE_PROVIDER_SITE_OTHER): Payer: Medicare Other | Admitting: Psychology

## 2017-03-12 DIAGNOSIS — F4322 Adjustment disorder with anxiety: Secondary | ICD-10-CM

## 2017-03-26 ENCOUNTER — Ambulatory Visit (INDEPENDENT_AMBULATORY_CARE_PROVIDER_SITE_OTHER): Payer: Medicare Other | Admitting: Psychology

## 2017-03-26 DIAGNOSIS — F4322 Adjustment disorder with anxiety: Secondary | ICD-10-CM | POA: Diagnosis not present

## 2017-04-09 ENCOUNTER — Ambulatory Visit: Payer: Self-pay | Admitting: Psychology

## 2017-04-23 ENCOUNTER — Ambulatory Visit (INDEPENDENT_AMBULATORY_CARE_PROVIDER_SITE_OTHER): Payer: Medicare Other | Admitting: Psychology

## 2017-04-23 DIAGNOSIS — F4322 Adjustment disorder with anxiety: Secondary | ICD-10-CM | POA: Diagnosis not present

## 2017-05-14 ENCOUNTER — Ambulatory Visit: Payer: Medicare Other | Admitting: Psychology

## 2017-05-28 ENCOUNTER — Ambulatory Visit (INDEPENDENT_AMBULATORY_CARE_PROVIDER_SITE_OTHER): Payer: Medicare Other | Admitting: Psychology

## 2017-05-28 DIAGNOSIS — F4322 Adjustment disorder with anxiety: Secondary | ICD-10-CM

## 2017-06-11 ENCOUNTER — Ambulatory Visit (INDEPENDENT_AMBULATORY_CARE_PROVIDER_SITE_OTHER): Payer: Medicare Other | Admitting: Psychology

## 2017-06-11 DIAGNOSIS — F4322 Adjustment disorder with anxiety: Secondary | ICD-10-CM | POA: Diagnosis not present

## 2017-07-02 ENCOUNTER — Ambulatory Visit: Payer: Medicare Other | Admitting: Psychology

## 2017-12-03 ENCOUNTER — Ambulatory Visit (INDEPENDENT_AMBULATORY_CARE_PROVIDER_SITE_OTHER): Payer: Medicare Other | Admitting: Psychology

## 2017-12-03 DIAGNOSIS — F4322 Adjustment disorder with anxiety: Secondary | ICD-10-CM | POA: Diagnosis not present

## 2018-01-06 DIAGNOSIS — H3530 Unspecified macular degeneration: Secondary | ICD-10-CM

## 2018-01-06 HISTORY — DX: Unspecified macular degeneration: H35.30

## 2018-01-11 ENCOUNTER — Ambulatory Visit: Payer: Medicare Other | Admitting: Psychology

## 2018-01-11 ENCOUNTER — Ambulatory Visit (INDEPENDENT_AMBULATORY_CARE_PROVIDER_SITE_OTHER): Payer: Medicare Other | Admitting: Psychology

## 2018-01-11 DIAGNOSIS — F4323 Adjustment disorder with mixed anxiety and depressed mood: Secondary | ICD-10-CM | POA: Diagnosis not present

## 2018-01-18 DIAGNOSIS — F419 Anxiety disorder, unspecified: Secondary | ICD-10-CM

## 2018-01-18 HISTORY — DX: Anxiety disorder, unspecified: F41.9

## 2018-01-25 ENCOUNTER — Ambulatory Visit: Payer: Medicare Other | Admitting: Psychology

## 2018-02-08 ENCOUNTER — Ambulatory Visit (INDEPENDENT_AMBULATORY_CARE_PROVIDER_SITE_OTHER): Payer: Medicare Other | Admitting: Psychology

## 2018-02-08 DIAGNOSIS — F4322 Adjustment disorder with anxiety: Secondary | ICD-10-CM | POA: Diagnosis not present

## 2018-02-22 ENCOUNTER — Ambulatory Visit: Payer: Medicare Other | Admitting: Psychology

## 2018-02-24 ENCOUNTER — Ambulatory Visit (INDEPENDENT_AMBULATORY_CARE_PROVIDER_SITE_OTHER): Payer: Medicare Other | Admitting: Psychology

## 2018-02-24 DIAGNOSIS — F4322 Adjustment disorder with anxiety: Secondary | ICD-10-CM

## 2018-03-08 ENCOUNTER — Ambulatory Visit: Payer: Medicare Other | Admitting: Psychology

## 2018-03-15 ENCOUNTER — Ambulatory Visit (INDEPENDENT_AMBULATORY_CARE_PROVIDER_SITE_OTHER): Payer: Medicare Other | Admitting: Psychology

## 2018-03-15 DIAGNOSIS — F4322 Adjustment disorder with anxiety: Secondary | ICD-10-CM | POA: Diagnosis not present

## 2018-03-22 ENCOUNTER — Ambulatory Visit (INDEPENDENT_AMBULATORY_CARE_PROVIDER_SITE_OTHER): Payer: Medicare Other | Admitting: Psychology

## 2018-03-22 DIAGNOSIS — F4322 Adjustment disorder with anxiety: Secondary | ICD-10-CM | POA: Diagnosis not present

## 2018-04-05 ENCOUNTER — Ambulatory Visit (INDEPENDENT_AMBULATORY_CARE_PROVIDER_SITE_OTHER): Payer: Medicare Other | Admitting: Psychology

## 2018-04-05 DIAGNOSIS — F4323 Adjustment disorder with mixed anxiety and depressed mood: Secondary | ICD-10-CM

## 2018-04-19 ENCOUNTER — Ambulatory Visit (INDEPENDENT_AMBULATORY_CARE_PROVIDER_SITE_OTHER): Payer: Medicare Other | Admitting: Psychology

## 2018-04-19 ENCOUNTER — Ambulatory Visit (INDEPENDENT_AMBULATORY_CARE_PROVIDER_SITE_OTHER): Payer: Medicare Other

## 2018-04-19 ENCOUNTER — Ambulatory Visit (INDEPENDENT_AMBULATORY_CARE_PROVIDER_SITE_OTHER): Payer: Medicare Other | Admitting: Orthopedic Surgery

## 2018-04-19 ENCOUNTER — Encounter (INDEPENDENT_AMBULATORY_CARE_PROVIDER_SITE_OTHER): Payer: Self-pay | Admitting: Orthopedic Surgery

## 2018-04-19 VITALS — Ht 70.5 in | Wt 238.4 lb

## 2018-04-19 DIAGNOSIS — M79671 Pain in right foot: Secondary | ICD-10-CM

## 2018-04-19 DIAGNOSIS — F4322 Adjustment disorder with anxiety: Secondary | ICD-10-CM

## 2018-04-19 DIAGNOSIS — M6701 Short Achilles tendon (acquired), right ankle: Secondary | ICD-10-CM | POA: Diagnosis not present

## 2018-04-19 NOTE — Progress Notes (Signed)
Office Visit Note   Patient: Gregory Wilkins           Date of Birth: 1941/04/01           MRN: [id number] Visit Date: 04/19/2018              Requested by: Bernerd Limbo, MD [facility] [address] [place], [place] [address] PCP: Bernerd Limbo, MD  Chief Complaint  Patient presents with  . Right Foot - Pain      HPI: Patient is a 77 year old gentleman who presents complaining of lateral right foot pain with weightbearing.  Patient denies any trauma.  Past medical history positive for arthritis and hypertension.  Assessment & Plan: Visit Diagnoses:  1. Right foot pain   2. Achilles tendon contracture, right     Plan: Patient was given instructions for heel cord stretching and a stiff soled athletic shoe to unload pressure points on her foot.  Discussed that if he fails conservative treatment a gastroc recession is an option.  Achilles stretching was demonstrated and patient states he understands.  Follow-Up Instructions: Return if symptoms worsen or fail to improve.   Ortho Exam  Patient is alert, oriented, no adenopathy, well-dressed, normal affect, normal respiratory effort. Examination patient has a good dorsalis pedis and posterior tibial pulse.  He has heel cord contracture with dorsiflexion only to neutral with his knee extended.  There is no callus or ulcers on the plantar aspect of the foot he has good subtalar and ankle motion.  He does have a plantarflexed first way that rolls his hindfoot over into varus overloading the lateral column.  Imaging: Xr Foot 2 Views Right  Result Date: 04/19/2018 2 view radiographs of the right foot shows calcification of the insertion of the Achilles no signs of fractures.  No images are attached to the encounter.  Labs: Lab Results  Component Value Date   HGBA1C 5.3 11/10/2008     Lab Results  Component Value Date   ALBUMIN 3.9 12/22/2012   ALBUMIN 4.6 02/24/2011   ALBUMIN 4.4 05/09/2010    Body mass index is 33.72  kg/m.  Orders:  Orders Placed This Encounter  Procedures  . XR Foot 2 Views Right   No orders of the defined types were placed in this encounter.    Procedures: No procedures performed  Clinical Data: No additional findings.  ROS:  All other systems negative, except as noted in the HPI. Review of Systems  Objective: Vital Signs: Ht 5' 10.5" (1.791 m)   Wt 238 lb 6.4 oz (108.1 kg)   BMI 33.72 kg/m   Specialty Comments:  No specialty comments available.  PMFS History: Patient Active Problem List   Diagnosis Date Noted  . Snoring 02/01/2014  . Obesity (BMI 30-39.9) 01/04/2014  . Blood donor 01/03/2014  . 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 RLS (restless legs syndrome) 06/30/2012  . Family history of type C viral hepatitis 06/30/2012  . DIVERTICULOSIS, COLON 11/09/2008  . BENIGN PROSTATIC HYPERTROPHY 11/09/2008  . HYPERLIPIDEMIA 09/21/2007  . HYPERTENSION, ESSENTIAL NOS 09/21/2007  . NEPHROLITHIASIS, HX OF 09/21/2007  . ALLERGIC RHINITIS 12/23/2006  . ACNE ROSACEA 12/23/2006  . COLONIC POLYPS, HX OF 12/23/2006   Past Medical History:  Diagnosis Date  . Allergic rhinitis   . 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 Diverticula of colon   . Hypertension   . Mallet finger 2012     4th L DIP,Dr Sypher  .  Nephrolithiasis     Family History  Problem Relation Age of Onset  . Diabetes Father   . Stroke Mother 23  . Hypertension Mother   . Heart disease Neg Hx   . Cancer Neg Hx     Past Surgical History:  Procedure Laterality Date  . basal cell cancer     X 3  . INGUINAL HERNIA REPAIR  45,51   right,left  . OPEN REDUCTION INTERNAL FIXATION (ORIF) METACARPAL Right 05/19/2013   Procedure: OPEN REDUCTION INTERNAL FIXATION  RIGHT SMALL  METACARPAL FRACTURE;  Surgeon: Cammie Sickle., MD;  Location: [facility];  Service: Orthopedics;  Laterality: Right;  . POLYPECTOMY     X 3; Dr Earlean Shawl  . WISDOM TOOTH EXTRACTION      Social History   Occupational History  . Occupation: Designer, jewellery  Tobacco Use  . Smoking status: Former Smoker    Packs/day: 2.00    Years: 26.00    Pack years: 52.00    Types: Cigarettes    Last attempt to quit: 07/29/1982    Years since quitting: 35.7  . Smokeless tobacco: Never Used  . Tobacco comment: ages [age], up to 2 ppd  Substance and Sexual Activity  . Alcohol use: No    Alcohol/week: 0.0 standard drinks  . Drug use: No  . Sexual activity: Not on file

## 2018-05-03 ENCOUNTER — Ambulatory Visit (INDEPENDENT_AMBULATORY_CARE_PROVIDER_SITE_OTHER): Payer: Medicare Other | Admitting: Psychology

## 2018-05-03 DIAGNOSIS — F4322 Adjustment disorder with anxiety: Secondary | ICD-10-CM | POA: Diagnosis not present

## 2018-05-17 ENCOUNTER — Ambulatory Visit (INDEPENDENT_AMBULATORY_CARE_PROVIDER_SITE_OTHER): Payer: Medicare Other | Admitting: Psychology

## 2018-05-17 DIAGNOSIS — F4322 Adjustment disorder with anxiety: Secondary | ICD-10-CM

## 2018-06-02 ENCOUNTER — Ambulatory Visit (INDEPENDENT_AMBULATORY_CARE_PROVIDER_SITE_OTHER): Payer: Medicare Other | Admitting: Psychology

## 2018-06-02 DIAGNOSIS — F4322 Adjustment disorder with anxiety: Secondary | ICD-10-CM

## 2018-06-16 ENCOUNTER — Ambulatory Visit (INDEPENDENT_AMBULATORY_CARE_PROVIDER_SITE_OTHER): Payer: Medicare Other | Admitting: Psychology

## 2018-06-16 DIAGNOSIS — F4322 Adjustment disorder with anxiety: Secondary | ICD-10-CM

## 2018-07-05 ENCOUNTER — Ambulatory Visit (INDEPENDENT_AMBULATORY_CARE_PROVIDER_SITE_OTHER): Payer: Medicare Other | Admitting: Psychology

## 2018-07-05 DIAGNOSIS — F4322 Adjustment disorder with anxiety: Secondary | ICD-10-CM | POA: Diagnosis not present

## 2018-07-22 ENCOUNTER — Ambulatory Visit (INDEPENDENT_AMBULATORY_CARE_PROVIDER_SITE_OTHER): Payer: Medicare Other | Admitting: Psychology

## 2018-07-22 DIAGNOSIS — F4322 Adjustment disorder with anxiety: Secondary | ICD-10-CM

## 2018-08-04 ENCOUNTER — Ambulatory Visit (INDEPENDENT_AMBULATORY_CARE_PROVIDER_SITE_OTHER): Payer: Medicare Other | Admitting: Psychology

## 2018-08-04 DIAGNOSIS — F4323 Adjustment disorder with mixed anxiety and depressed mood: Secondary | ICD-10-CM | POA: Diagnosis not present

## 2018-08-18 ENCOUNTER — Ambulatory Visit (INDEPENDENT_AMBULATORY_CARE_PROVIDER_SITE_OTHER): Payer: Medicare Other | Admitting: Psychology

## 2018-08-18 DIAGNOSIS — F4323 Adjustment disorder with mixed anxiety and depressed mood: Secondary | ICD-10-CM | POA: Diagnosis not present

## 2018-09-01 ENCOUNTER — Ambulatory Visit: Payer: Medicare Other | Admitting: Psychology

## 2018-09-15 ENCOUNTER — Ambulatory Visit (INDEPENDENT_AMBULATORY_CARE_PROVIDER_SITE_OTHER): Payer: Medicare Other | Admitting: Psychology

## 2018-09-15 DIAGNOSIS — F4322 Adjustment disorder with anxiety: Secondary | ICD-10-CM | POA: Diagnosis not present

## 2018-09-30 ENCOUNTER — Ambulatory Visit (INDEPENDENT_AMBULATORY_CARE_PROVIDER_SITE_OTHER): Payer: Medicare Other | Admitting: Psychology

## 2018-09-30 DIAGNOSIS — F4322 Adjustment disorder with anxiety: Secondary | ICD-10-CM | POA: Diagnosis not present

## 2018-10-13 ENCOUNTER — Other Ambulatory Visit: Payer: Self-pay

## 2018-10-13 ENCOUNTER — Ambulatory Visit (INDEPENDENT_AMBULATORY_CARE_PROVIDER_SITE_OTHER): Payer: Medicare Other | Admitting: Psychology

## 2018-10-13 DIAGNOSIS — F4322 Adjustment disorder with anxiety: Secondary | ICD-10-CM | POA: Diagnosis not present

## 2018-10-27 ENCOUNTER — Ambulatory Visit (INDEPENDENT_AMBULATORY_CARE_PROVIDER_SITE_OTHER): Payer: Medicare Other | Admitting: Psychology

## 2018-10-27 DIAGNOSIS — F4322 Adjustment disorder with anxiety: Secondary | ICD-10-CM | POA: Diagnosis not present

## 2018-11-10 ENCOUNTER — Ambulatory Visit (INDEPENDENT_AMBULATORY_CARE_PROVIDER_SITE_OTHER): Payer: Medicare Other | Admitting: Psychology

## 2018-11-10 DIAGNOSIS — F4322 Adjustment disorder with anxiety: Secondary | ICD-10-CM | POA: Diagnosis not present

## 2018-11-24 ENCOUNTER — Ambulatory Visit (INDEPENDENT_AMBULATORY_CARE_PROVIDER_SITE_OTHER): Payer: Medicare Other | Admitting: Psychology

## 2018-11-24 DIAGNOSIS — F4322 Adjustment disorder with anxiety: Secondary | ICD-10-CM | POA: Diagnosis not present

## 2018-12-08 ENCOUNTER — Ambulatory Visit (INDEPENDENT_AMBULATORY_CARE_PROVIDER_SITE_OTHER): Payer: Medicare Other | Admitting: Psychology

## 2018-12-08 DIAGNOSIS — F4322 Adjustment disorder with anxiety: Secondary | ICD-10-CM

## 2018-12-17 ENCOUNTER — Emergency Department (HOSPITAL_BASED_OUTPATIENT_CLINIC_OR_DEPARTMENT_OTHER): Payer: Medicare Other

## 2018-12-17 ENCOUNTER — Other Ambulatory Visit: Payer: Self-pay

## 2018-12-17 ENCOUNTER — Encounter (HOSPITAL_BASED_OUTPATIENT_CLINIC_OR_DEPARTMENT_OTHER): Payer: Self-pay

## 2018-12-17 ENCOUNTER — Emergency Department (HOSPITAL_BASED_OUTPATIENT_CLINIC_OR_DEPARTMENT_OTHER)
Admission: EM | Admit: 2018-12-17 | Discharge: 2018-12-17 | Disposition: A | Discharge status: Home / Self Care | Payer: Medicare Other | Attending: Emergency Medicine | Admitting: Emergency Medicine

## 2018-12-17 DIAGNOSIS — B349 Viral infection, unspecified: Secondary | ICD-10-CM | POA: Insufficient documentation

## 2018-12-17 DIAGNOSIS — Z20828 Contact with and (suspected) exposure to other viral communicable diseases: Secondary | ICD-10-CM | POA: Diagnosis not present

## 2018-12-17 DIAGNOSIS — I10 Essential (primary) hypertension: Secondary | ICD-10-CM | POA: Insufficient documentation

## 2018-12-17 DIAGNOSIS — R509 Fever, unspecified: Secondary | ICD-10-CM

## 2018-12-17 DIAGNOSIS — Z79899 Other long term (current) drug therapy: Secondary | ICD-10-CM | POA: Insufficient documentation

## 2018-12-17 DIAGNOSIS — R05 Cough: Secondary | ICD-10-CM | POA: Diagnosis present

## 2018-12-17 DIAGNOSIS — Z85828 Personal history of other malignant neoplasm of skin: Secondary | ICD-10-CM | POA: Insufficient documentation

## 2018-12-17 DIAGNOSIS — Z7982 Long term (current) use of aspirin: Secondary | ICD-10-CM | POA: Insufficient documentation

## 2018-12-17 DIAGNOSIS — Z87891 Personal history of nicotine dependence: Secondary | ICD-10-CM | POA: Diagnosis not present

## 2018-12-17 DIAGNOSIS — R059 Cough, unspecified: Secondary | ICD-10-CM

## 2018-12-17 NOTE — ED Notes (Signed)
ED Provider at bedside. 

## 2018-12-17 NOTE — Discharge Instructions (Signed)
Your x-ray today did not show pneumonia however given your fever and cough, and the current pandemic we are concerned he may have coronavirus.  Your test was sent, please follow-up on the result.  Please follow-up with your PCP if any symptoms change or worsen, please return to the nearest emergency department.      Person Under Monitoring Name: Gregory Wilkins  Location: [place] [address]   Infection Prevention Recommendations for Individuals Confirmed to have, or Being Evaluated for, 2019 Novel Coronavirus (COVID-19) Infection Who Receive Care at Home  Individuals who are confirmed to have, or are being evaluated for, COVID-19 should follow the prevention steps below until a healthcare provider or local or state health department says they can return to normal activities.  Stay home except to get medical care You should restrict activities outside your home, except for getting medical care. Do not go to work, school, or public areas, and do not use public transportation or taxis.  Call ahead before visiting your doctor Before your medical appointment, call the healthcare provider and tell them that you have, or are being evaluated for, COVID-19 infection. This will help the healthcare providers office take steps to keep other people from getting infected. Ask your healthcare provider to call the local or state health department.  Monitor your symptoms Seek prompt medical attention if your illness is worsening (e.g., difficulty breathing). Before going to your medical appointment, call the healthcare provider and tell them that you have, or are being evaluated for, COVID-19 infection. Ask your healthcare provider to call the local or state health department.  Wear a facemask You should wear a facemask that covers your nose and mouth when you are in the same room with other people and when you visit a healthcare provider. People who live with or visit you should also  wear a facemask while they are in the same room with you.  Separate yourself from other people in your home As much as possible, you should stay in a different room from other people in your home. Also, you should use a separate bathroom, if available.  Avoid sharing household items You should not share dishes, drinking glasses, cups, eating utensils, towels, bedding, or other items with other people in your home. After using these items, you should wash them thoroughly with soap and water.  Cover your coughs and sneezes Cover your mouth and nose with a tissue when you cough or sneeze, or you can cough or sneeze into your sleeve. Throw used tissues in a lined trash can, and immediately wash your hands with soap and water for at least 20 seconds or use an alcohol-based hand rub.  Wash your [facility] your hands often and thoroughly with soap and water for at least 20 seconds. You can use an alcohol-based hand sanitizer if soap and water are not available and if your hands are not visibly dirty. Avoid touching your eyes, nose, and mouth with unwashed hands.   Prevention Steps for Caregivers and Household Members of Individuals Confirmed to have, or Being Evaluated for, COVID-19 Infection Being Cared for in the Home  If you live with, or provide care at home for, a person confirmed to have, or being evaluated for, COVID-19 infection please follow these guidelines to prevent infection:  Follow healthcare providers instructions Make sure that you understand and can help the patient follow any healthcare provider instructions for all care.  Provide for the patients basic needs You should help  the patient with basic needs in the home and provide support for getting groceries, prescriptions, and other personal needs.  Monitor the patients symptoms If they are getting sicker, call his or her medical provider and tell them that the patient has, or is being evaluated for, COVID-19  infection. This will help the healthcare providers office take steps to keep other people from getting infected. Ask the healthcare provider to call the local or state health department.  Limit the number of people who have contact with the patient If possible, have only one caregiver for the patient. Other household members should stay in another home or place of residence. If this is not possible, they should stay in another room, or be separated from the patient as much as possible. Use a separate bathroom, if available. Restrict visitors who do not have an essential need to be in the home.  Keep older adults, very young children, and other sick people away from the patient Keep older adults, very young children, and those who have compromised immune systems or chronic health conditions away from the patient. This includes people with chronic heart, lung, or kidney conditions, diabetes, and cancer.  Ensure good ventilation Make sure that shared spaces in the home have good air flow, such as from an air conditioner or an opened window, weather permitting.  Wash your hands often Wash your hands often and thoroughly with soap and water for at least 20 seconds. You can use an alcohol based hand sanitizer if soap and water are not available and if your hands are not visibly dirty. Avoid touching your eyes, nose, and mouth with unwashed hands. Use disposable paper towels to dry your hands. If not available, use dedicated cloth towels and replace them when they become wet.  Wear a facemask and gloves Wear a disposable facemask at all times in the room and gloves when you touch or have contact with the patients blood, body fluids, and/or secretions or excretions, such as sweat, saliva, sputum, nasal mucus, vomit, urine, or feces.  Ensure the mask fits over your nose and mouth tightly, and do not touch it during use. Throw out disposable facemasks and gloves after using them. Do not reuse. Wash  your hands immediately after removing your facemask and gloves. If your personal clothing becomes contaminated, carefully remove clothing and launder. Wash your hands after handling contaminated clothing. Place all used disposable facemasks, gloves, and other waste in a lined container before disposing them with other household waste. Remove gloves and wash your hands immediately after handling these items.  Do not share dishes, glasses, or other household items with the patient Avoid sharing household items. You should not share dishes, drinking glasses, cups, eating utensils, towels, bedding, or other items with a patient who is confirmed to have, or being evaluated for, COVID-19 infection. After the person uses these items, you should wash them thoroughly with soap and water.  Wash laundry thoroughly Immediately remove and wash clothes or bedding that have blood, body fluids, and/or secretions or excretions, such as sweat, saliva, sputum, nasal mucus, vomit, urine, or feces, on them. Wear gloves when handling laundry from the patient. Read and follow directions on labels of laundry or clothing items and detergent. In general, wash and dry with the warmest temperatures recommended on the label.  Clean all areas the individual has used often Clean all touchable surfaces, such as counters, tabletops, doorknobs, bathroom fixtures, toilets, phones, keyboards, tablets, and bedside tables, every day. Also, clean any surfaces  that may have blood, body fluids, and/or secretions or excretions on them. Wear gloves when cleaning surfaces the patient has come in contact with. Use a diluted bleach solution (e.g., dilute bleach with 1 part bleach and 10 parts water) or a household disinfectant with a label that says EPA-registered for coronaviruses. To make a bleach solution at home, add 1 tablespoon of bleach to 1 quart (4 cups) of water. For a larger supply, add  cup of bleach to 1 gallon (16 cups) of  water. Read labels of cleaning products and follow recommendations provided on product labels. Labels contain instructions for safe and effective use of the cleaning product including precautions you should take when applying the product, such as wearing gloves or eye protection and making sure you have good ventilation during use of the product. Remove gloves and wash hands immediately after cleaning.  Monitor yourself for signs and symptoms of illness Caregivers and household members are considered close contacts, should monitor their health, and will be asked to limit movement outside of the home to the extent possible. Follow the monitoring steps for close contacts listed on the symptom monitoring form.   ? If you have additional questions, contact your local health department or call the epidemiologist on call at [phone] (available 24/7). ? This guidance is subject to change. For the most up-to-date guidance from [facility], please refer to their website: YouBlogs.pl

## 2018-12-17 NOTE — ED Provider Notes (Signed)
[facility] Provider Note   CSN: [id number] Arrival date & time: 12/17/18  Plain    History   Chief Complaint Chief Complaint  Patient presents with  . Fever    HPI DELRON COMER is a 78 y.o. male.     The history is provided by the patient and medical records. No language interpreter was used.  Cough  Cough characteristics:  Non-productive Severity:  Moderate Onset quality:  Gradual Duration:  4 days Timing:  Intermittent Progression:  Waxing and waning Chronicity:  New Relieved by:  Nothing Worsened by:  Nothing Associated symptoms: chills, fever and sinus congestion   Associated symptoms: no chest pain, no diaphoresis, no headaches, no rash, no rhinorrhea, no shortness of breath, no sore throat and no wheezing     Past Medical History:  Diagnosis Date  . Allergic rhinitis   . 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 Diverticula of colon   . Hypertension   . Mallet finger 2012     4th L DIP,Dr Sypher  . Nephrolithiasis     Patient Active Problem List   Diagnosis Date Noted  . Snoring 02/01/2014  . Obesity (BMI 30-39.9) 01/04/2014  . Blood donor 01/03/2014  . 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 RLS (restless legs syndrome) 06/30/2012  . Family history of type C viral hepatitis 06/30/2012  . DIVERTICULOSIS, COLON 11/09/2008  . BENIGN PROSTATIC HYPERTROPHY 11/09/2008  . HYPERLIPIDEMIA 09/21/2007  . HYPERTENSION, ESSENTIAL NOS 09/21/2007  . NEPHROLITHIASIS, HX OF 09/21/2007  . ALLERGIC RHINITIS 12/23/2006  . ACNE ROSACEA 12/23/2006  . COLONIC POLYPS, HX OF 12/23/2006    Past Surgical History:  Procedure Laterality Date  . basal cell cancer     X 3  . INGUINAL HERNIA REPAIR  [age]   right,left  . OPEN REDUCTION INTERNAL FIXATION (ORIF) METACARPAL Right 05/19/2013   Procedure: OPEN REDUCTION INTERNAL FIXATION  RIGHT SMALL  METACARPAL FRACTURE;  Surgeon: Cammie Sickle., MD;  Location: [facility];  Service: Orthopedics;  Laterality: Right;  . POLYPECTOMY     X 3; Dr Earlean Shawl  . WISDOM TOOTH EXTRACTION          Home Medications    Prior to Admission medications   Medication Sig Start Date End Date Taking? Authorizing Provider  amLODipine (NORVASC) 5 MG tablet TAKE 1 TABLET (5 MG TOTAL) BY MOUTH DAILY. 12/25/15   Ezekiel Slocumb, PA-C  aspirin EC 81 MG tablet Take 81 mg by mouth daily.    [provider]  atorvastatin (LIPITOR) 10 MG tablet Take 10 mg by mouth daily.    [provider]  clonazePAM (KLONOPIN) 0.5 MG tablet TAKE 1 TABLET BY MOUTH EVERY NIGHT AT BEDTIME AS NEEDED FOR RESTLESS LEG 04/19/15   Hendricks Limes, MD  fexofenadine (ALLEGRA) 180 MG tablet Take 180 mg by mouth daily.    [provider]  mometasone (NASONEX) 50 MCG/ACT nasal spray Place 1 spray into the nose as needed.    [provider]  Multiple Vitamin (MULTIVITAMIN) tablet Take 1 tablet by mouth daily.      [provider]  Olopatadine HCl (PATANASE NA) Place 2 sprays into the nose as needed.    [provider]  Sulfacetamide Sodium 10 % SHAM Apply topically.    [provider]  Sulfur 5 % BAR Apply topically.    [provider]    Family History Family History  Problem Relation Age of Onset  . Diabetes Father   . Stroke Mother 6  . Hypertension Mother   . Heart disease Neg Hx   . Cancer Neg Hx     Social History Social History   Tobacco Use  . Smoking status: Former Smoker    Packs/day: 2.00    Years: 26.00    Pack years: 52.00    Types: Cigarettes    Last attempt to quit: 07/29/1982    Years since quitting: 36.4  . Smokeless tobacco: Never Used  . Tobacco comment: ages [age], up to 2 ppd  Substance Use Topics  . Alcohol use: No    Alcohol/week: 0.0 standard drinks  . Drug use: No     Allergies   Penicillins   Review of Systems Review of Systems  Constitutional: Positive for chills,  fatigue and fever. Negative for diaphoresis.  HENT: Positive for congestion. Negative for rhinorrhea and sore throat.   Eyes: Negative for visual disturbance.  Respiratory: Positive for cough. Negative for chest tightness, shortness of breath, wheezing and stridor.   Cardiovascular: Negative for chest pain, palpitations and leg swelling.  Gastrointestinal: Negative for constipation, diarrhea, nausea and vomiting.  Genitourinary: Negative for flank pain.  Musculoskeletal: Negative for back pain, neck pain and neck stiffness.  Skin: Negative for rash and wound.  Neurological: Negative for light-headedness and headaches.  Psychiatric/Behavioral: Negative for agitation.  All other systems reviewed and are negative.    Physical Exam Updated Vital Signs BP 121/85   Pulse 83   Temp 99.4 F (37.4 C) (Oral)   Resp (!) 24   Ht 5\' 9"  (1.753 m)   Wt 108.9 kg   SpO2 97%   BMI 35.44 kg/m   Physical Exam Vitals signs and nursing note reviewed.  Constitutional:      General: He is not in acute distress.    Appearance: He is well-developed. He is not ill-appearing, toxic-appearing or diaphoretic.  HENT:     Head: Normocephalic and atraumatic.     Nose: Congestion present.     Mouth/Throat:     Mouth: Mucous membranes are moist.     Pharynx: No oropharyngeal exudate or posterior oropharyngeal erythema.  Eyes:     Extraocular Movements: Extraocular movements intact.     Conjunctiva/sclera: Conjunctivae normal.     Pupils: Pupils are equal, round, and reactive to light.  Neck:     Musculoskeletal: Neck supple. No muscular tenderness.  Cardiovascular:     Rate and Rhythm: Normal rate and regular rhythm.     Heart sounds: No murmur.  Pulmonary:     Effort: Pulmonary effort is normal. No respiratory distress.     Breath sounds: Normal breath sounds. No wheezing, rhonchi or rales.  Chest:     Chest wall: No tenderness.  Abdominal:     General: There is no distension.     Palpations:  Abdomen is soft.     Tenderness: There is no abdominal tenderness.  Musculoskeletal:        General: No tenderness.  Skin:    General: Skin is warm and dry.     Capillary Refill: Capillary refill takes less than 2 seconds.     Findings: No erythema.  Neurological:     General: No focal deficit present.     Mental Status: He is alert.  Psychiatric:        Mood and Affect: Mood normal.      ED Treatments / Results  Labs (all labs  ordered are listed, but only abnormal results are displayed) Labs Reviewed  NOVEL CORONAVIRUS, NAA (HOSPITAL ORDER, SEND-OUT TO REF LAB)    EKG None  Radiology Dg Chest Portable 1 View  Result Date: 12/17/2018 CLINICAL DATA:  Cough and fever EXAM: PORTABLE CHEST 1 VIEW COMPARISON:  None. FINDINGS: There is no edema or consolidation. Heart is upper normal in size with pulmonary vascularity normal. No adenopathy. No pneumothorax. No bone lesions. IMPRESSION: No edema or consolidation.  Heart upper normal in size. Electronically Signed   By: Lowella Grip III M.D.   On: 12/17/2018 20:39    Procedures Procedures (including critical care time)  Medications Ordered in ED Medications - No data to display  REGNALD BOWENS was evaluated in Emergency Department on 12/17/2018 for the symptoms described in the history of present illness. He was evaluated in the context of the global COVID-19 pandemic, which necessitated consideration that the patient might be at risk for infection with the SARS-CoV-2 virus that causes COVID-19. Institutional protocols and algorithms that pertain to the evaluation of patients at risk for COVID-19 are in a state of rapid change based on information released by regulatory bodies including the CDC and federal and state organizations. These policies and algorithms were followed during the patient's care in the ED.    Initial Impression / Assessment and Plan / ED Course  I have reviewed the triage vital signs and the nursing  notes.  Pertinent labs & imaging results that were available during my care of the patient were reviewed by me and considered in my medical decision making (see chart for details).        JOAB CARDEN is a 78 y.o. male with a past medical history significant for hypertension, hyperlipidemia, restless legs, kidney stones and BPH who presents for fevers, aches, and cough.  He reports that he had fever at home over 100 today and has been having some cough over the last few days.  He reports a mild aches and chills but denies any chest pain.  He denies shortness of breath.  He denies any nausea, vomiting, urinary symptoms constipation, or diarrhea.  Denies recent sick contacts.  In the setting of the pandemic, he is concerned he may have coronavirus.  On exam, lungs are clear and chest is nontender.  Abdomen is nontender.  Patient is not tachycardic but is slightly tachypneic.  His oral temperature was 9.4.  Legs are nontender and not significantly edematous.  Patient resting comfortably.  Oxygen saturations are between 9497% which he reports is his baseline.  Clinically I suspect patient may have the novel coronavirus given the current pandemic.  Patient reports given his age and his cough he would prefer to get a chest x-ray to rule out pneumonia as well.  This was ordered and showed no pneumonia.  Patient will be sent home with instructions for self quarantine and had to the send out coronavirus test.  He was given strict return precautions for any new or worsened symptoms as well as self home isolation guidelines.  Patient completely understood the plan of care and other questions or concerns.  Patient vital signs reassuring we feel he safe for discharge home.  Patient discharged in good condition.   Final Clinical Impressions(s) / ED Diagnoses   Final diagnoses:  Cough  Fever, unspecified fever cause  Viral syndrome    ED Discharge Orders    None      Clinical Impression: 1. Cough    2. Fever,  unspecified fever cause   3. Viral syndrome     Disposition: Discharge  Condition: Good  I have discussed the results, Dx and Tx plan with the pt(& family if present). He/she/they expressed understanding and agree(s) with the plan. Discharge instructions discussed at great length. Strict return precautions discussed and pt &/or family have verbalized understanding of the instructions. No further questions at time of discharge.    New Prescriptions   No medications on file    Follow Up: Bernerd Limbo, MD [address] [place] [address] [phone]     [facility] [address] [id number] [facility] [place] [phone]       Tegeler, Gwenyth Allegra, MD 12/17/18 2129

## 2018-12-17 NOTE — ED Triage Notes (Signed)
Pt c/o fever, body aches x today-chronic nonprod cough-NAD-steady gait

## 2018-12-20 LAB — NOVEL CORONAVIRUS, NAA (HOSP ORDER, SEND-OUT TO REF LAB; TAT 18-24 HRS): SARS-CoV-2, NAA: NOT DETECTED

## 2018-12-22 ENCOUNTER — Ambulatory Visit (INDEPENDENT_AMBULATORY_CARE_PROVIDER_SITE_OTHER): Payer: Medicare Other | Admitting: Psychology

## 2018-12-22 DIAGNOSIS — F4322 Adjustment disorder with anxiety: Secondary | ICD-10-CM

## 2019-01-05 ENCOUNTER — Ambulatory Visit (INDEPENDENT_AMBULATORY_CARE_PROVIDER_SITE_OTHER): Payer: Medicare Other | Admitting: Psychology

## 2019-01-05 DIAGNOSIS — F4322 Adjustment disorder with anxiety: Secondary | ICD-10-CM

## 2019-01-19 ENCOUNTER — Ambulatory Visit (INDEPENDENT_AMBULATORY_CARE_PROVIDER_SITE_OTHER): Payer: Medicare Other | Admitting: Psychology

## 2019-01-19 DIAGNOSIS — F4322 Adjustment disorder with anxiety: Secondary | ICD-10-CM

## 2019-02-02 ENCOUNTER — Ambulatory Visit (INDEPENDENT_AMBULATORY_CARE_PROVIDER_SITE_OTHER): Payer: Medicare Other | Admitting: Psychology

## 2019-02-02 DIAGNOSIS — F4322 Adjustment disorder with anxiety: Secondary | ICD-10-CM

## 2019-02-16 ENCOUNTER — Ambulatory Visit (INDEPENDENT_AMBULATORY_CARE_PROVIDER_SITE_OTHER): Payer: Medicare Other | Admitting: Psychology

## 2019-02-16 DIAGNOSIS — F4322 Adjustment disorder with anxiety: Secondary | ICD-10-CM

## 2019-03-02 ENCOUNTER — Ambulatory Visit (INDEPENDENT_AMBULATORY_CARE_PROVIDER_SITE_OTHER): Payer: Medicare Other | Admitting: Psychology

## 2019-03-02 DIAGNOSIS — F4322 Adjustment disorder with anxiety: Secondary | ICD-10-CM

## 2019-03-16 ENCOUNTER — Ambulatory Visit (INDEPENDENT_AMBULATORY_CARE_PROVIDER_SITE_OTHER): Payer: Medicare Other | Admitting: Psychology

## 2019-03-16 DIAGNOSIS — F4322 Adjustment disorder with anxiety: Secondary | ICD-10-CM | POA: Diagnosis not present

## 2019-03-30 ENCOUNTER — Ambulatory Visit (INDEPENDENT_AMBULATORY_CARE_PROVIDER_SITE_OTHER): Payer: Medicare Other | Admitting: Psychology

## 2019-03-30 DIAGNOSIS — F4322 Adjustment disorder with anxiety: Secondary | ICD-10-CM | POA: Diagnosis not present

## 2019-04-13 ENCOUNTER — Ambulatory Visit (INDEPENDENT_AMBULATORY_CARE_PROVIDER_SITE_OTHER): Payer: Medicare Other | Admitting: Psychology

## 2019-04-13 DIAGNOSIS — F4322 Adjustment disorder with anxiety: Secondary | ICD-10-CM

## 2019-04-27 ENCOUNTER — Ambulatory Visit (INDEPENDENT_AMBULATORY_CARE_PROVIDER_SITE_OTHER): Payer: Medicare Other | Admitting: Psychology

## 2019-04-27 DIAGNOSIS — F4322 Adjustment disorder with anxiety: Secondary | ICD-10-CM

## 2019-04-28 DIAGNOSIS — K7460 Unspecified cirrhosis of liver: Secondary | ICD-10-CM

## 2019-04-28 DIAGNOSIS — S36119A Unspecified injury of liver, initial encounter: Secondary | ICD-10-CM

## 2019-04-28 DIAGNOSIS — B159 Hepatitis A without hepatic coma: Secondary | ICD-10-CM

## 2019-04-28 HISTORY — DX: Unspecified cirrhosis of liver: K74.60

## 2019-04-28 HISTORY — DX: Unspecified injury of liver, initial encounter: S36.119A

## 2019-04-28 HISTORY — DX: Hepatitis a without hepatic coma: B15.9

## 2019-05-11 ENCOUNTER — Ambulatory Visit (INDEPENDENT_AMBULATORY_CARE_PROVIDER_SITE_OTHER): Payer: Medicare Other | Admitting: Psychology

## 2019-05-11 DIAGNOSIS — F4322 Adjustment disorder with anxiety: Secondary | ICD-10-CM | POA: Diagnosis not present

## 2019-05-12 ENCOUNTER — Encounter (HOSPITAL_COMMUNITY): Payer: Self-pay

## 2019-05-12 ENCOUNTER — Ambulatory Visit (INDEPENDENT_AMBULATORY_CARE_PROVIDER_SITE_OTHER): Payer: Medicare Other

## 2019-05-12 ENCOUNTER — Other Ambulatory Visit: Payer: Self-pay

## 2019-05-12 ENCOUNTER — Ambulatory Visit (HOSPITAL_COMMUNITY)
Admission: EM | Admit: 2019-05-12 | Discharge: 2019-05-12 | Disposition: A | Discharge status: Home / Self Care | Payer: Medicare Other | Attending: Urgent Care | Admitting: Urgent Care

## 2019-05-12 DIAGNOSIS — Z833 Family history of diabetes mellitus: Secondary | ICD-10-CM | POA: Diagnosis not present

## 2019-05-12 DIAGNOSIS — J3089 Other allergic rhinitis: Secondary | ICD-10-CM

## 2019-05-12 DIAGNOSIS — Z880 Allergy status to penicillin: Secondary | ICD-10-CM | POA: Insufficient documentation

## 2019-05-12 DIAGNOSIS — Z20828 Contact with and (suspected) exposure to other viral communicable diseases: Secondary | ICD-10-CM | POA: Insufficient documentation

## 2019-05-12 DIAGNOSIS — R05 Cough: Secondary | ICD-10-CM | POA: Diagnosis not present

## 2019-05-12 DIAGNOSIS — R059 Cough, unspecified: Secondary | ICD-10-CM

## 2019-05-12 DIAGNOSIS — Z7982 Long term (current) use of aspirin: Secondary | ICD-10-CM | POA: Diagnosis not present

## 2019-05-12 DIAGNOSIS — Z823 Family history of stroke: Secondary | ICD-10-CM | POA: Insufficient documentation

## 2019-05-12 DIAGNOSIS — Z87891 Personal history of nicotine dependence: Secondary | ICD-10-CM | POA: Insufficient documentation

## 2019-05-12 DIAGNOSIS — R5381 Other malaise: Secondary | ICD-10-CM

## 2019-05-12 DIAGNOSIS — Z79899 Other long term (current) drug therapy: Secondary | ICD-10-CM | POA: Insufficient documentation

## 2019-05-12 DIAGNOSIS — Z7951 Long term (current) use of inhaled steroids: Secondary | ICD-10-CM | POA: Diagnosis not present

## 2019-05-12 DIAGNOSIS — Z8249 Family history of ischemic heart disease and other diseases of the circulatory system: Secondary | ICD-10-CM | POA: Diagnosis not present

## 2019-05-12 DIAGNOSIS — I10 Essential (primary) hypertension: Secondary | ICD-10-CM | POA: Insufficient documentation

## 2019-05-12 DIAGNOSIS — R5383 Other fatigue: Secondary | ICD-10-CM | POA: Diagnosis not present

## 2019-05-12 DIAGNOSIS — R0602 Shortness of breath: Secondary | ICD-10-CM

## 2019-05-12 MED ORDER — BENZONATATE 100 MG PO CAPS: 100.0000 mg | ORAL_CAPSULE | Freq: Three times a day (TID) | ORAL | 0 refills | Status: DC | PRN

## 2019-05-12 MED ORDER — PROMETHAZINE-DM 6.25-15 MG/5ML PO SYRP: 5.0000 mL | ORAL_SOLUTION | Freq: Four times a day (QID) | ORAL | 0 refills | Status: DC | PRN

## 2019-05-12 NOTE — ED Triage Notes (Signed)
Pt presents with non productive cough, shortness of breath with exertion,  and fatigue over the past week.

## 2019-05-12 NOTE — Discharge Instructions (Signed)
It would be important to discuss using a steroid course to address allergic rhinitis, an allergy flare, as a source of your persistent cough, dyspnea. But you also should discuss getting a d-dimer (a blood test) or chest CT scan to rule out clot given your symptoms. You don't have big risk factors but blood clot is a possibility.

## 2019-05-12 NOTE — ED Provider Notes (Signed)
MRN: [id number] DOB: March 22, 1941  Subjective:   Gregory Wilkins is a 78 y.o. male presenting for 1 week history of persistent dry cough, shortness of breath, dyspnea and general moderate malaise.  Patient had a Covid test last week and was negative.  However, he admits that he is living with a son that is not very conscious of social distancing and would like to be retested for Covid.  He does have a history of allergic rhinitis.  Denies smoking cigarettes, drinking alcohol.  No current facility-administered medications for this encounter.   Current Outpatient Medications:  .  amLODipine (NORVASC) 5 MG tablet, TAKE 1 TABLET (5 MG TOTAL) BY MOUTH DAILY., Disp: 30 tablet, Rfl: 0 .  aspirin EC 81 MG tablet, Take 81 mg by mouth daily., Disp: , Rfl:  .  atorvastatin (LIPITOR) 10 MG tablet, Take 10 mg by mouth daily., Disp: , Rfl:  .  clonazePAM (KLONOPIN) 0.5 MG tablet, TAKE 1 TABLET BY MOUTH EVERY NIGHT AT BEDTIME AS NEEDED FOR RESTLESS LEG, Disp: 90 tablet, Rfl: 1 .  fexofenadine (ALLEGRA) 180 MG tablet, Take 180 mg by mouth daily., Disp: , Rfl:  .  mometasone (NASONEX) 50 MCG/ACT nasal spray, Place 1 spray into the nose as needed., Disp: , Rfl:  .  Multiple Vitamin (MULTIVITAMIN) tablet, Take 1 tablet by mouth daily.  , Disp: , Rfl:  .  Olopatadine HCl (PATANASE NA), Place 2 sprays into the nose as needed., Disp: , Rfl:  .  Sulfacetamide Sodium 10 % SHAM, Apply topically., Disp: , Rfl:  .  Sulfur 5 % BAR, Apply topically., Disp: , Rfl:    Allergies  Allergen Reactions  . Penicillins     hives    Past Medical History:  Diagnosis Date  . Allergic rhinitis   . 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 Diverticula of colon   . Hypertension   . Mallet finger 2012     4th L DIP,Dr Sypher  . Nephrolithiasis      Past Surgical History:  Procedure Laterality Date  . basal cell cancer     X 3  . INGUINAL HERNIA REPAIR  [age]   right,left  . OPEN REDUCTION INTERNAL FIXATION  (ORIF) METACARPAL Right 05/19/2013   Procedure: OPEN REDUCTION INTERNAL FIXATION  RIGHT SMALL  METACARPAL FRACTURE;  Surgeon: Cammie Sickle., MD;  Location: [facility];  Service: Orthopedics;  Laterality: Right;  . POLYPECTOMY     X 3; Dr Earlean Shawl  . WISDOM TOOTH EXTRACTION      Social History   Tobacco Use  . Smoking status: Former Smoker    Packs/day: 2.00    Years: 26.00    Pack years: 52.00    Types: Cigarettes    Quit date: 07/29/1982    Years since quitting: 36.8  . Smokeless tobacco: Never Used  . Tobacco comment: ages [age], up to 2 ppd  Substance Use Topics  . Alcohol use: No    Alcohol/week: 0.0 standard drinks  . Drug use: No   Family History  Problem Relation Age of Onset  . Diabetes Father   . Stroke Mother 81  . Hypertension Mother   . Heart disease Neg Hx   . Cancer Neg Hx      Review of Systems  Constitutional: Positive for malaise/fatigue. Negative for fever.  HENT: Positive for congestion. Negative for ear pain, sinus pain and sore throat.   Eyes: Negative for discharge and redness.  Respiratory:  Positive for cough and shortness of breath. Negative for hemoptysis and wheezing.   Cardiovascular: Negative for chest pain.  Gastrointestinal: Negative for abdominal pain, diarrhea, nausea and vomiting.  Genitourinary: Negative for dysuria, flank pain and hematuria.  Musculoskeletal: Negative for myalgias.  Skin: Negative for rash.  Neurological: Negative for dizziness, weakness and headaches.  Psychiatric/Behavioral: Negative for depression and substance abuse.    Objective:   Vitals: BP 106/90 (BP Location: Left Arm)   Pulse (!) 103   Temp 98.4 F (36.9 C) (Oral)   Resp 16   SpO2 94%   Physical Exam Constitutional:      General: He is not in acute distress.    Appearance: Normal appearance. He is well-developed. He is obese. He is not ill-appearing, toxic-appearing or diaphoretic.  HENT:     Head: Normocephalic and  atraumatic.     Right Ear: External ear normal.     Left Ear: External ear normal.     Nose: Congestion present.     Mouth/Throat:     Mouth: Mucous membranes are moist.     Comments: Significant postnasal drainage. Eyes:     General: No scleral icterus.    Extraocular Movements: Extraocular movements intact.     Pupils: Pupils are equal, round, and reactive to light.  Cardiovascular:     Rate and Rhythm: Normal rate and regular rhythm.     Heart sounds: Normal heart sounds. No murmur. No friction rub. No gallop.   Pulmonary:     Effort: Pulmonary effort is normal. No respiratory distress.     Breath sounds: Normal breath sounds. No stridor. No wheezing, rhonchi or rales.  Neurological:     Mental Status: He is alert and oriented to person, place, and time.  Psychiatric:        Mood and Affect: Mood normal.        Behavior: Behavior normal.        Thought Content: Thought content normal.        Judgment: Judgment normal.     Dg Chest 2 View  Result Date: 05/12/2019 CLINICAL DATA:  78 year old male with cough and shortness of breath. EXAM: CHEST - 2 VIEW COMPARISON:  Chest radiograph dated 12/17/2018 FINDINGS: The lungs are clear. There is no pleural effusion pneumothorax. The cardiac silhouette is within normal limits. No acute osseous pathology. IMPRESSION: No active cardiopulmonary disease. Electronically Signed   By: Anner Crete M.D.   On: 05/12/2019 19:19     Assessment and Plan :   1. Cough   2. Shortness of breath   3. Malaise and fatigue   4. Allergic rhinitis due to other allergic trigger, unspecified seasonality     Discussed multiple etiologies for patient including allergic rhinitis as most likely source of his persistent cough and fatigue.  He does take Zyrtec consistently and I emphasized the need to keep this medication on board.  Also discussed possibility of PE despite low risk factors as patient denies history of clots, long distance travel, recent  surgeries and current treatment for cancer.  Discussed symptomatic relief with Tessalon Perles, cough syrup.  Patient will use this and try to contact his PCP tomorrow morning to keep working on identifying source of his symptoms.  He declined prednisone course to address allergic rhinitis for now. Counseled patient on potential for adverse effects with medications prescribed/recommended today, ER and return-to-clinic precautions discussed, patient verbalized understanding.    Jaynee Eagles, [place] 05/12/19 [phone]

## 2019-05-15 LAB — NOVEL CORONAVIRUS, NAA (HOSP ORDER, SEND-OUT TO REF LAB; TAT 18-24 HRS): SARS-CoV-2, NAA: NOT DETECTED

## 2019-05-16 ENCOUNTER — Telehealth: Payer: Self-pay

## 2019-05-16 ENCOUNTER — Ambulatory Visit (INDEPENDENT_AMBULATORY_CARE_PROVIDER_SITE_OTHER): Payer: Medicare Other | Admitting: Internal Medicine

## 2019-05-16 ENCOUNTER — Encounter (HOSPITAL_BASED_OUTPATIENT_CLINIC_OR_DEPARTMENT_OTHER): Payer: Self-pay

## 2019-05-16 ENCOUNTER — Emergency Department (HOSPITAL_BASED_OUTPATIENT_CLINIC_OR_DEPARTMENT_OTHER)
Admission: EM | Admit: 2019-05-16 | Discharge: 2019-05-16 | Disposition: A | Discharge status: Other Acute Inpatient Hospital | Payer: Medicare Other | Attending: Emergency Medicine | Admitting: Emergency Medicine

## 2019-05-16 ENCOUNTER — Other Ambulatory Visit: Payer: Self-pay

## 2019-05-16 ENCOUNTER — Encounter: Payer: Self-pay | Admitting: Internal Medicine

## 2019-05-16 ENCOUNTER — Emergency Department (HOSPITAL_BASED_OUTPATIENT_CLINIC_OR_DEPARTMENT_OTHER): Payer: Medicare Other

## 2019-05-16 VITALS — BP 83/66 | HR 92 | Temp 97.3°F | Resp 16 | Ht 70.0 in | Wt 241.1 lb

## 2019-05-16 DIAGNOSIS — Z20828 Contact with and (suspected) exposure to other viral communicable diseases: Secondary | ICD-10-CM | POA: Diagnosis not present

## 2019-05-16 DIAGNOSIS — I959 Hypotension, unspecified: Secondary | ICD-10-CM

## 2019-05-16 DIAGNOSIS — R0600 Dyspnea, unspecified: Secondary | ICD-10-CM | POA: Diagnosis not present

## 2019-05-16 DIAGNOSIS — Z79899 Other long term (current) drug therapy: Secondary | ICD-10-CM | POA: Insufficient documentation

## 2019-05-16 DIAGNOSIS — R0609 Other forms of dyspnea: Secondary | ICD-10-CM

## 2019-05-16 DIAGNOSIS — I10 Essential (primary) hypertension: Secondary | ICD-10-CM | POA: Diagnosis not present

## 2019-05-16 DIAGNOSIS — R17 Unspecified jaundice: Secondary | ICD-10-CM

## 2019-05-16 DIAGNOSIS — R5383 Other fatigue: Secondary | ICD-10-CM

## 2019-05-16 DIAGNOSIS — R7989 Other specified abnormal findings of blood chemistry: Secondary | ICD-10-CM

## 2019-05-16 DIAGNOSIS — R0602 Shortness of breath: Secondary | ICD-10-CM | POA: Diagnosis present

## 2019-05-16 DIAGNOSIS — K7200 Acute and subacute hepatic failure without coma: Secondary | ICD-10-CM

## 2019-05-16 DIAGNOSIS — Z87891 Personal history of nicotine dependence: Secondary | ICD-10-CM | POA: Diagnosis not present

## 2019-05-16 LAB — URINALYSIS, ROUTINE W REFLEX MICROSCOPIC
Glucose, UA: NEGATIVE mg/dL
Ketones, ur: NEGATIVE mg/dL
Leukocytes,Ua: NEGATIVE
Nitrite: NEGATIVE
Protein, ur: NEGATIVE mg/dL
Specific Gravity, Urine: <1.005 — ABNORMAL LOW (ref 1.005–1.030)
pH: 6 (ref 5.0–8.0)

## 2019-05-16 LAB — CBC
HCT: 46.8 % (ref 39.0–52.0)
Hemoglobin: 15.6 g/dL (ref 13.0–17.0)
MCH: 28.6 pg (ref 26.0–34.0)
MCHC: 33.3 g/dL (ref 30.0–36.0)
MCV: 85.7 fL (ref 80.0–100.0)
Platelets: 283 10*3/uL (ref 150–400)
RBC: 5.46 MIL/uL (ref 4.22–5.81)
RDW: 14.6 % (ref 11.5–15.5)
WBC: 5.9 10*3/uL (ref 4.0–10.5)
nRBC: 0 % (ref 0.0–0.2)

## 2019-05-16 LAB — RAPID URINE DRUG SCREEN, HOSP PERFORMED
Amphetamines: NOT DETECTED
Barbiturates: NOT DETECTED
Benzodiazepines: NOT DETECTED
Cocaine: NOT DETECTED
Opiates: NOT DETECTED
Tetrahydrocannabinol: NOT DETECTED

## 2019-05-16 LAB — COMPREHENSIVE METABOLIC PANEL
ALT: 2659 U/L — ABNORMAL HIGH (ref 0–44)
AST: 2710 U/L — ABNORMAL HIGH (ref 15–41)
Albumin: 3 g/dL — ABNORMAL LOW (ref 3.5–5.0)
Alkaline Phosphatase: 180 U/L — ABNORMAL HIGH (ref 38–126)
Anion gap: 15 (ref 5–15)
BUN: 26 mg/dL — ABNORMAL HIGH (ref 8–23)
CO2: 22 mmol/L (ref 22–32)
Calcium: 8.2 mg/dL — ABNORMAL LOW (ref 8.9–10.3)
Chloride: 94 mmol/L — ABNORMAL LOW (ref 98–111)
Creatinine, Ser: 1.94 mg/dL — ABNORMAL HIGH (ref 0.61–1.24)
GFR calc Af Amer: 38 mL/min — ABNORMAL LOW (ref >60)
GFR calc non Af Amer: 33 mL/min — ABNORMAL LOW (ref >60)
Glucose, Bld: 114 mg/dL — ABNORMAL HIGH (ref 70–99)
Potassium: 3.1 mmol/L — ABNORMAL LOW (ref 3.5–5.1)
Sodium: 131 mmol/L — ABNORMAL LOW (ref 135–145)
Total Bilirubin: 13.3 mg/dL — ABNORMAL HIGH (ref 0.3–1.2)
Total Protein: 7.4 g/dL (ref 6.5–8.1)

## 2019-05-16 LAB — PROTIME-INR
INR: 1.8 — ABNORMAL HIGH (ref 0.8–1.2)
Prothrombin Time: 20.3 seconds — ABNORMAL HIGH (ref 11.4–15.2)

## 2019-05-16 LAB — ETHANOL: Alcohol, Ethyl (B): <10 mg/dL (ref <10)

## 2019-05-16 LAB — URINALYSIS, MICROSCOPIC (REFLEX)

## 2019-05-16 LAB — MAGNESIUM: Magnesium: 2.3 mg/dL (ref 1.7–2.4)

## 2019-05-16 LAB — LACTIC ACID, PLASMA
Lactic Acid, Venous: 1.6 mmol/L (ref 0.5–1.9)
Lactic Acid, Venous: 1.4 mmol/L (ref 0.5–1.9)

## 2019-05-16 LAB — ACETAMINOPHEN LEVEL: Acetaminophen (Tylenol), Serum: <10 ug/mL — ABNORMAL LOW (ref 10–30)

## 2019-05-16 LAB — AMMONIA: Ammonia: 23 umol/L (ref 9–35)

## 2019-05-16 LAB — SARS CORONAVIRUS 2 BY RT PCR (HOSPITAL ORDER, PERFORMED IN ~~LOC~~ HOSPITAL LAB): SARS Coronavirus 2: NEGATIVE

## 2019-05-16 LAB — LIPASE, BLOOD: Lipase: 59 U/L — ABNORMAL HIGH (ref 11–51)

## 2019-05-16 MED ORDER — ACETYLCYSTEINE LOAD VIA INFUSION
150.0000 mg/kg | Freq: Once | INTRAVENOUS | Status: AC
  Administered 2019-05-16: 22:00:00 16410 mg via INTRAVENOUS
  Filled 2019-05-16: qty 411

## 2019-05-16 MED ORDER — SODIUM CHLORIDE 0.9 % IV BOLUS
1000.0000 mL | Freq: Once | INTRAVENOUS | Status: AC
  Administered 2019-05-16: 1000 mL via INTRAVENOUS

## 2019-05-16 MED ORDER — ACETYLCYSTEINE 200 MG/ML IV SOLN
INTRAVENOUS | Status: AC
  Filled 2019-05-16: qty 30

## 2019-05-16 MED ORDER — SODIUM CHLORIDE 0.9 % IV BOLUS
1000.0000 mL | Freq: Once | INTRAVENOUS | Status: AC
  Administered 2019-05-16: 18:00:00 1000 mL via INTRAVENOUS

## 2019-05-16 MED ORDER — ONDANSETRON HCL 4 MG/2ML IJ SOLN
4.0000 mg | Freq: Once | INTRAMUSCULAR | Status: AC
  Administered 2019-05-16: 4 mg via INTRAVENOUS
  Filled 2019-05-16: qty 2

## 2019-05-16 MED ORDER — IOHEXOL 300 MG/ML  SOLN
100.0000 mL | Freq: Once | INTRAMUSCULAR | Status: AC | PRN
  Administered 2019-05-16: 100 mL via INTRAVENOUS

## 2019-05-16 NOTE — Progress Notes (Signed)
Subjective:    Patient ID: Gregory Wilkins, male    DOB: Feb 21, 1941, 78 y.o.   MRN: [id number]  DOS:  05/16/2019 Type of visit - description: New patient, acute The patient's wife who I know, asked me to see Gregory Wilkins today.  On 05/02/2019, he had a temperature of 100.2, at the time he had a Covid test and it came back negative. He continue to run low-grade fevers until 05/04/2019, since then he is afebrile.  On 05/07/2019, he felt some malaise, decreased appetite, felt tired, mild DOE.  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 Went to urgent care, a chest x-ray was negative, a second Covid test was negative.  Since then, he is feeling somewhat better, but has not exercise much.  Labs 01/18/2019: CBCShow a hemoglobin of 13.5. PSA 1.4 Total cholesterol 138, LDL 66 Creatinine 0.9, LFTs normal.    Review of Systems  Denies previous history of diabetes, heart disease or COPD  No chest pain or palpitations He has a chronic cough, no worse than baseline. Denies any recent headaches or sinus congestion. Admits to some mild nausea postprandially without vomiting, diarrhea or blood in the stools. No abdominal pain No recent airplane trip or prolonged car trip. No lower extremity swelling or calf pain. He reports his urine is very dark   Past Medical History:  Diagnosis Date  . Allergic rhinitis   . 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 Diverticula of colon   . Hypertension   . Mallet finger 2012     4th L DIP,Dr Sypher  . Nephrolithiasis     Past Surgical History:  Procedure Laterality Date  . basal cell cancer     X 3  . INGUINAL HERNIA REPAIR  [age]   right,left  . OPEN REDUCTION INTERNAL FIXATION (ORIF) METACARPAL Right 05/19/2013   Procedure: OPEN REDUCTION INTERNAL FIXATION  RIGHT SMALL  METACARPAL FRACTURE;  Surgeon: Cammie Sickle., MD;  Location: [facility];  Service: Orthopedics;  Laterality: Right;  . POLYPECTOMY     X 3; Dr Earlean Shawl  . WISDOM TOOTH EXTRACTION     Family History  Problem Relation Age of Onset  . Diabetes Father   . Stroke Mother 64  . Hypertension Mother   . Heart disease Neg Hx   . Cancer Neg Hx     Social History   Socioeconomic History  . Marital status: Married    Spouse name: Gregory Wilkins   . Number of children: Not on file  . Years of education: Not on file  . Highest education level: Not on file  Occupational History  . Occupation: retired---Financial Administrator  Social Needs  . Financial resource strain: Not on file  . Food insecurity    Worry: Not on file    Inability: Not on file  . Transportation needs    Medical: Not on file    Non-medical: Not on file  Tobacco Use  . Smoking status: Former Smoker    Packs/day: 2.00    Years: 26.00    Pack years: 52.00    Types: Cigarettes    Quit date: 07/29/1982    Years since quitting: 36.8  . Smokeless tobacco: Never Used  . Tobacco comment: ages [age], up to 2 ppd  Substance and Sexual Activity  . Alcohol use: No    Alcohol/week: 0.0 standard  drinks  . Drug use: No  . Sexual activity: Not on file  Lifestyle  . Physical activity    Days per week: Not on file    Minutes per session: Not on file  . Stress: Not on file  Relationships  . Social Herbalist on phone: Not on file    Gets together: Not on file    Attends religious service: Not on file    Active member of club or organization: Not on file    Attends meetings of clubs or organizations: Not on file    Relationship status: Not on file  . Intimate partner violence    Fear of current or ex partner: Not on file    Emotionally abused: Not on file    Physically abused: Not on file    Forced sexual activity: Not on file  Other Topics Concern  . Not on file  Social History Narrative   Household: pt, wife, child      Allergies as of 05/16/2019      Reactions    Penicillins    hives   Ramipril Anaphylaxis, Swelling      Medication List       Accurate as of May 16, 2019 11:59 PM. If you have any questions, ask your nurse or doctor.        STOP taking these medications   amLODipine 5 MG tablet Commonly known as: NORVASC Stopped by: Bishop Dublin, CMA   aspirin EC 81 MG tablet Stopped by: Bishop Dublin, CMA   benzonatate 100 MG capsule Commonly known as: TESSALON Stopped by: Kathlene November, MD   fexofenadine 180 MG tablet Commonly known as: ALLEGRA Stopped by: Bishop Dublin, CMA   Krill Oil 500 MG Caps Stopped by: Kathlene November, MD   mometasone 50 MCG/ACT nasal spray Commonly known as: NASONEX Stopped by: Bishop Dublin, CMA   multivitamin tablet Stopped by: Kathlene November, MD   PATANASE NA Stopped by: Bishop Dublin, CMA   Salicylic Acid 6 % Sham Stopped by: Kathlene November, MD   Sulfacetamide Sodium 10 % Sham Stopped by: Kathlene November, MD   Sulfur 5 % Bar Stopped by: Bishop Dublin, CMA     TAKE these medications   atorvastatin 10 MG tablet Commonly known as: LIPITOR Take 10 mg by mouth daily.   cetirizine 10 MG tablet Commonly known as: ZYRTEC Take 1 tablet (10 mg total) by mouth daily.   clonazePAM 0.5 MG tablet Commonly known as: KLONOPIN TAKE 1 TABLET BY MOUTH EVERY NIGHT AT BEDTIME AS NEEDED FOR RESTLESS LEG   fluocinolone 0.01 % external solution Commonly known as: SYNALAR Apply topically 2 (two) times daily.   ICAPS AREDS 2 PO Take by mouth. What changed: Another medication with the same name was removed. Continue taking this medication, and follow the directions you see here. Changed by: Kathlene November, MD   losartan-hydrochlorothiazide 50-12.5 MG tablet Commonly known as: HYZAAR Take 1 tablet by mouth daily.   promethazine-dextromethorphan 6.25-15 MG/5ML syrup Commonly known as: PROMETHAZINE-DM Take 5 mLs by mouth 4 (four) times daily as needed for cough.           Objective:   Physical Exam BP (!)  83/66 (BP Location: Left Arm, Patient Position: Sitting, Cuff Size: Normal)   Pulse 92   Temp (!) 97.3 F (36.3 C) (Temporal)   Resp 16   Ht 5\' 10"  (1.778 m)   Wt 241 lb 2 oz (109.4  kg)   SpO2 95%   BMI 34.60 kg/m  General:   Well developed, NAD, BMI noted.  HEENT:  Normocephalic . Face symmetric, atraumatic.  Icteric appearing Lungs:  CTA B Normal respiratory effort, no intercostal retractions, no accessory muscle use. Heart: RRR,  no murmur.  no pretibial edema bilaterally  Abdomen:  Not distended, soft, non-tender. No rebound or rigidity.  No obvious organomegaly Skin: Few telangiectasias in the chest Neurologic:  alert & oriented X3.  Speech normal, gait appropriate for age and unassisted Psych--  Cognition and judgment appear intact.  Cooperative with normal attention span and concentration.  Behavior appropriate. No anxious or depressed appearing.     Assessment    Assessment (new patient 05/17/2019) HTN High Cholesterol Restless leg (clonazepam prn) Physiologic bradycardia Colonoscopy 02-2019 H/o kidney stone   Plan: 78 year old gentleman, history of HTN, high cholesterol, not on any new medications that I know presents with: - fever few days ago -negative chest x-ray, two recent neg Covid tests. - On exam, he is hypotensive, jaundice.   - Afebrile. His blood pressure few months ago was 118/70 with a pulse of 61, consequently he is also tachycardic. EKG today: Sinus rhythm, no clear-cut acute changes, not much different from previous EKG. Needs further evaluation now  to figure out the etiology of his jaundice (hemolysis?)  and hypotension (DDX large from anemia, dehydration, GI bleed, etc. Less likely a cardiac etiology or PE ).  I spoke with the ER physician who accepted to see the patient and I appreciate his help.  Today, I spent more than 30 min with the patient: >50% of the time counseling regards to new problems, new onset of jaundice, DOE.   Coordinating his care with the ER physician, informing the patient's wife about his status.

## 2019-05-16 NOTE — Telephone Encounter (Signed)
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 Larose Kells needs new Pt appt (virtually). Okay to work him in today.

## 2019-05-16 NOTE — Plan of Care (Signed)
Patient's case reviewed with Dr. Sherry Ruffing in ED [facility].  Patient with profound mixed pattern elevation of LFTs.  No biliary obstruction on CT scan.  No hepatic or pancreaticobiliary mass/lesion.  Based on my discussion with Dr. Sherry Ruffing, patient has normal mental status and no abdominal pain.  Principal symptom is fatigue and jaundice.  INR 1.8 (no anticoagulants).  Lactate 1.6.  TB 13.  AST and ALT each ~ 2700.  Na 131.  Cr 1.94.  MELD 31.  No clear reported acute ingestions (e.g., alcohol, illicit substances).  No clear recent new medications (e.g., antibiotics, acetaminophen).  My assessment is of acute liver failure of unclear etiology with jaundice, coagulopathy and acute renal insufficiency.  I have advised patient be transferred to [facility] with liver transplant capabilities.  Unless there is some clear and obvious contraindication for patient being a potential liver transplant candidate (which would need to be explicitly noted in the chart with the name of the hepatologist and the facility that declines patient transfer as well as the reason), there is no role for patient to be transferred to any of the [facility] facilities, as we do not have liver transplant capabilities and would not be able to provide all the support that the patient might potentially need.

## 2019-05-16 NOTE — ED Notes (Signed)
Report given to [facility] team. Reported ETA 45-50 min.

## 2019-05-16 NOTE — ED Notes (Signed)
At present time pt. In no distress and is in no resp. Distress  A & O ... comfortable and no complaints of pain.

## 2019-05-16 NOTE — Telephone Encounter (Signed)
Appt scheduled

## 2019-05-16 NOTE — ED Provider Notes (Signed)
[facility] EMERGENCY DEPARTMENT Provider Note   CSN: [id number] Arrival date & time: 05/16/19  1626     History   Chief Complaint Chief Complaint  Patient presents with  . Shortness of Breath    HPI Gregory Wilkins is a 78 y.o. male.     The history is provided by the patient and medical records. No language interpreter was used.  Shortness of Breath Severity:  Severe Onset quality:  Gradual Duration:  5 days Timing:  Intermittent Progression:  Waxing and waning Chronicity:  New Relieved by:  Nothing Worsened by:  Exertion Ineffective treatments:  None tried Associated symptoms: diaphoresis (per PCP note)   Associated symptoms: no abdominal pain, no chest pain, no cough, no fever, no headaches, no neck pain, no rash, no vomiting and no wheezing     Past Medical History:  Diagnosis Date  . Allergic rhinitis   . 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 Diverticula of colon   . Hypertension   . Mallet finger 2012     4th L DIP,Dr Sypher  . Nephrolithiasis     Patient Active Problem List   Diagnosis Date Noted  . Anxiety 01/18/2018  . Macular degeneration 01/06/2018  . Snoring 02/01/2014  . Obesity (BMI 30-39.9) 01/04/2014  . Blood donor 01/03/2014  . 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 RLS (restless legs syndrome) 06/30/2012  . Family history of type C viral hepatitis 06/30/2012  . DIVERTICULOSIS, COLON 11/09/2008  . BENIGN PROSTATIC HYPERTROPHY 11/09/2008  . HYPERLIPIDEMIA 09/21/2007  . HYPERTENSION, ESSENTIAL NOS 09/21/2007  . NEPHROLITHIASIS, HX OF 09/21/2007  . ALLERGIC RHINITIS 12/23/2006  . ACNE ROSACEA 12/23/2006  . COLONIC POLYPS, HX OF 12/23/2006    Past Surgical History:  Procedure Laterality Date  . basal cell cancer     X 3  . INGUINAL HERNIA REPAIR  [age]   right,left  . OPEN REDUCTION INTERNAL FIXATION (ORIF) METACARPAL Right 05/19/2013   Procedure: OPEN REDUCTION INTERNAL FIXATION  RIGHT SMALL   METACARPAL FRACTURE;  Surgeon: Cammie Sickle., MD;  Location: [facility];  Service: Orthopedics;  Laterality: Right;  . POLYPECTOMY     X 3; Dr Earlean Shawl  . WISDOM TOOTH EXTRACTION          Home Medications    Prior to Admission medications   Medication Sig Start Date End Date Taking? Authorizing Provider  atorvastatin (LIPITOR) 10 MG tablet Take 10 mg by mouth daily.    [provider]  cetirizine (ZYRTEC) 10 MG tablet Take 1 tablet (10 mg total) by mouth daily. 05/16/19   Colon Branch, MD  clonazePAM (KLONOPIN) 0.5 MG tablet TAKE 1 TABLET BY MOUTH EVERY NIGHT AT BEDTIME AS NEEDED FOR RESTLESS LEG 04/19/15   Hendricks Limes, MD  fluocinolone (SYNALAR) 0.01 % external solution Apply topically 2 (two) times daily. 05/16/19   Colon Branch, MD  losartan-hydrochlorothiazide (HYZAAR) 50-12.5 MG tablet Take 1 tablet by mouth daily. 03/16/17   [provider]  Multiple Vitamins-Minerals (ICAPS AREDS 2 PO) Take by mouth.    [provider]  promethazine-dextromethorphan (PROMETHAZINE-DM) 6.25-15 MG/5ML syrup Take 5 mLs by mouth 4 (four) times daily as needed for cough. Patient not taking: Reported on 05/16/2019 05/12/19   Jaynee Eagles, PA-C    Family History Family History  Problem Relation Age of Onset  . Diabetes Father   . Stroke Mother 76  . Hypertension Mother   . Heart  disease Neg Hx   . Cancer Neg Hx     Social History Social History   Tobacco Use  . Smoking status: Former Smoker    Packs/day: 2.00    Years: 26.00    Pack years: 52.00    Types: Cigarettes    Quit date: 07/29/1982    Years since quitting: 36.8  . Smokeless tobacco: Never Used  . Tobacco comment: ages [age], up to 2 ppd  Substance Use Topics  . Alcohol use: No    Alcohol/week: 0.0 standard drinks  . Drug use: No     Allergies   Penicillins and Ramipril   Review of Systems Review of Systems  Constitutional: Positive for diaphoresis (per PCP note) and  fatigue. Negative for chills and fever.  HENT: Negative for congestion and rhinorrhea.   Eyes: Negative for visual disturbance.  Respiratory: Positive for shortness of breath. Negative for cough, chest tightness, wheezing and stridor.   Cardiovascular: Negative for chest pain, palpitations and leg swelling.  Gastrointestinal: Positive for nausea. Negative for abdominal distention, abdominal pain, constipation, diarrhea and vomiting.  Genitourinary: Negative for dysuria, flank pain and frequency.       Darker urine  Musculoskeletal: Negative for back pain, neck pain and neck stiffness.  Skin: Negative for rash and wound.  Neurological: Negative for light-headedness, numbness and headaches.  Psychiatric/Behavioral: Negative for agitation and confusion.  All other systems reviewed and are negative.    Physical Exam Updated Vital Signs BP 96/66 (BP Location: Right Arm)   Pulse 92   Temp 98.3 F (36.8 C) (Oral)   Resp 20   Ht 5' 10" (1.778 m)   Wt 109.4 kg   SpO2 96%   BMI 34.60 kg/m   Physical Exam Vitals signs and nursing note reviewed.  Constitutional:      General: He is not in acute distress.    Appearance: He is well-developed. He is ill-appearing. He is not toxic-appearing or diaphoretic.  HENT:     Head: Normocephalic and atraumatic.     Nose: No congestion or rhinorrhea.     Mouth/Throat:     Mouth: Mucous membranes are moist.     Pharynx: No oropharyngeal exudate or posterior oropharyngeal erythema.  Eyes:     General: Scleral icterus present.     Extraocular Movements: Extraocular movements intact.     Conjunctiva/sclera: Conjunctivae normal.     Pupils: Pupils are equal, round, and reactive to light.  Neck:     Musculoskeletal: Neck supple. No muscular tenderness.  Cardiovascular:     Rate and Rhythm: Normal rate and regular rhythm.     Pulses: Normal pulses.     Heart sounds: No murmur.  Pulmonary:     Effort: Pulmonary effort is normal. No respiratory  distress.     Breath sounds: Rales present. No wheezing or rhonchi.  Chest:     Chest wall: No tenderness.  Abdominal:     General: Abdomen is flat. There is no distension.     Palpations: Abdomen is soft.     Tenderness: There is no abdominal tenderness. There is no right CVA tenderness, left CVA tenderness or rebound.  Musculoskeletal:        General: No tenderness.     Right lower leg: No edema.     Left lower leg: No edema.  Skin:    General: Skin is warm and dry.     Capillary Refill: Capillary refill takes less than 2 seconds.     Coloration:  Skin is jaundiced.     Findings: No erythema.  Neurological:     General: No focal deficit present.     Mental Status: He is alert.     Sensory: No sensory deficit.     Motor: No weakness.  Psychiatric:        Mood and Affect: Mood normal.      ED Treatments / Results  Labs (all labs ordered are listed, but only abnormal results are displayed) Labs Reviewed  LIPASE, BLOOD - Abnormal; Notable for the following components:      Result Value   Lipase 59 (*)    All other components within normal limits  COMPREHENSIVE METABOLIC PANEL - Abnormal; Notable for the following components:   Sodium 131 (*)    Potassium 3.1 (*)    Chloride 94 (*)    Glucose, Bld 114 (*)    BUN 26 (*)    Creatinine, Ser 1.94 (*)    Calcium 8.2 (*)    Albumin 3.0 (*)    AST 2,710 (*)    ALT 2,659 (*)    Alkaline Phosphatase 180 (*)    Total Bilirubin 13.3 (*)    GFR calc non Af Amer 33 (*)    GFR calc Af Amer 38 (*)    All other components within normal limits  URINALYSIS, ROUTINE W REFLEX MICROSCOPIC - Abnormal; Notable for the following components:   Color, Urine AMBER (*)    Specific Gravity, Urine <1.005 (*)    Hgb urine dipstick TRACE (*)    Bilirubin Urine MODERATE (*)    All other components within normal limits  PROTIME-INR - Abnormal; Notable for the following components:   Prothrombin Time 20.3 (*)    INR 1.8 (*)    All other  components within normal limits  ACETAMINOPHEN LEVEL - Abnormal; Notable for the following components:   Acetaminophen (Tylenol), Serum <10 (*)    All other components within normal limits  URINALYSIS, MICROSCOPIC (REFLEX) - Abnormal; Notable for the following components:   Bacteria, UA FEW (*)    All other components within normal limits  SARS CORONAVIRUS 2 BY RT PCR (HOSPITAL ORDER, [facility])  URINE CULTURE  CULTURE, BLOOD (ROUTINE X 2)  CULTURE, BLOOD (ROUTINE X 2)  CBC  AMMONIA  MAGNESIUM  LACTIC ACID, PLASMA  LACTIC ACID, PLASMA  RAPID URINE DRUG SCREEN, HOSP PERFORMED  ETHANOL  HEPATITIS PANEL, ACUTE    EKG EKG Interpretation  Date/Time:  Monday May 16 2019 16:53:08 EDT Ventricular Rate:  86 PR Interval:    QRS Duration: 107 QT Interval:  496 QTC Calculation: 594 R Axis:   62 Text Interpretation:  Sinus rhythm Low voltage, extremity leads Prolonged QT interval No prior ECG for comparison.  No STEMI Confirmed by Antony Blackbird [phone]) on 05/16/2019 6:03:13 PM   Radiology Dg Chest 2 View  Result Date: 05/16/2019 CLINICAL DATA:  Hypotension EXAM: CHEST - 2 VIEW COMPARISON:  05/12/2019 FINDINGS: Possible spiculated opacity over the posterior lower lung on lateral view. No pleural effusion. Normal heart size. No pneumothorax. IMPRESSION: Possible spiculated opacity on lateral view over the posterior lower lung. Recommend chest CT for further evaluation. Electronically Signed   By: Donavan Foil M.D.   On: 05/16/2019 18:47   Ct Abdomen Pelvis W Contrast  Result Date: 05/16/2019 CLINICAL DATA:  Jaundice. Hypotension. Fatigue. Weight loss. EXAM: CT ABDOMEN AND PELVIS WITH CONTRAST TECHNIQUE: Multidetector CT imaging of the abdomen and pelvis was performed using  the standard protocol following bolus administration of intravenous contrast. CONTRAST:  138m OMNIPAQUE IOHEXOL 300 MG/ML  SOLN COMPARISON:  None. FINDINGS: Lower Chest: No acute  findings. Hepatobiliary: No hepatic masses identified. Gallbladder is nondistended. Mild gallbladder wall thickening is seen without evidence of pericholecystic inflammatory changes. No evidence of biliary ductal dilatation. Pancreas:  No mass or inflammatory changes. Spleen: Within normal limits in size and appearance. Adrenals/Urinary Tract: No masses identified. Small right renal cyst noted. No evidence of hydronephrosis. Stomach/Bowel: No evidence of obstruction, inflammatory process or abnormal fluid collections. Normal appendix visualized. Diverticulosis is seen mainly involving the descending and sigmoid colon, however there is no evidence of diverticulitis. Vascular/Lymphatic: No pathologically enlarged lymph nodes. No abdominal aortic aneurysm. Aortic atherosclerosis. Reproductive:  Mildly enlarged prostate. Other:  Small left inguinal hernia containing only fat. Musculoskeletal:  No suspicious bone lesions identified. IMPRESSION: Nondistended gallbladder shows mild wall thickening which is nonspecific. No other hepatobiliary abnormality identified. Colonic diverticulosis, without radiographic evidence of diverticulitis or other acute findings. Mildly enlarged prostate. Small left inguinal hernia containing only fat. Electronically Signed   By: JMarlaine HindM.D.   On: 05/16/2019 18:46    Procedures Procedures (including critical care time)  CRITICAL CARE Performed by: CGwenyth AllegraTegeler Total critical care time: 60 minutes Critical care time was exclusive of separately billable procedures and treating other patients. Critical care was necessary to treat or prevent imminent or life-threatening deterioration. Critical care was time spent personally by me on the following activities: development of treatment plan with patient and/or surrogate as well as nursing, discussions with consultants, evaluation of patient's response to treatment, examination of patient, obtaining history from patient or  surrogate, ordering and performing treatments and interventions, ordering and review of laboratory studies, ordering and review of radiographic studies, pulse oximetry and re-evaluation of patient's condition.   Medications Ordered in ED Medications  acetylcysteine (ACETADOTE) 200 MG/ML injection (has no administration in time range)  sodium chloride 0.9 % bolus 1,000 mL (0 mLs Intravenous Stopped 05/16/19 1947)  ondansetron (ZOFRAN) injection 4 mg (4 mg Intravenous Given 05/16/19 1741)  iohexol (OMNIPAQUE) 300 MG/ML solution 100 mL (100 mLs Intravenous Contrast Given 05/16/19 1826)  sodium chloride 0.9 % bolus 1,000 mL ( Intravenous Stopped 05/16/19 2219)  acetylcysteine (ACETADOTE) 40 mg/mL load via infusion 16,410 mg (16,410 mg Intravenous Bolus from Bag 05/16/19 2229)     Initial Impression / Assessment and Plan / ED Course  I have reviewed the triage vital signs and the nursing notes.  Pertinent labs & imaging results that were available during my care of the patient were reviewed by me and considered in my medical decision making (see chart for details).        Gregory SLEDGEis a 78y.o. male with a past medical history significant for hypertension, hyperlipidemia, prior diverticulosis, and BPH who presents from PCP for hypotension, fatigue, nausea, and generalized weakness.  Patient reports that for the last week he has been having worsening symptoms of decreased energy, exertional shortness of breath, fatigue, and some nausea.  He denies episodes of vomiting.  He denies any new constipation or diarrhea.  He reports that several weeks ago he had intermittent fevers and chills and was tested several times for coronavirus and was negative.  He denies any new cough or chest pain.  He denies any current shortness of breath.  He denies palpitations.  He denies any abdominal pains but does feel nauseous and has decreased appetite.  He feels that he  is dehydrated.  He reports his urine is  slightly darker.  He denies any dysuria.  He denies any trauma.  He saw his PCP today who found him to be hypotensive with blood pressure in the 80s.  He also appeared jaundiced to the PCP.  Patient was sent to the emergency department for evaluation.  On my exam, patient is afebrile but he is hypotensive.  Blood pressures in the 80s on arrival.  Patient is very jaundiced both on skin and mucosal exam.  Abdomen is nontender.  Normal bowel sounds.  Lungs have some crackles in the bases, but no significant rhonchi.  No murmur.  Legs are nontender and have good pulses.  Minimal lower extremity edema.  Clinical I am concerned that patient may have a biliary abnormality or with his hypotension possible biliary infection.  We will get screening labs, urine, give fluids, nausea medicine, and will get imaging of the abdomen and pelvis.  Also get chest x-ray to look for occult infection.  At this facility at this time, ultrasound is not available for right upper quadrant ultrasound.  Anticipate reassessment after work-up.   8:43 PM Patient's work-up began to return with concerning findings.  Patient's lactic acid was normal x2.  Ammonia not elevated.  INR is elevated at 1.8 and lipase is slight elevated at 59.  Most concerning is the patient's LFTs and liver evaluation. AST and ALT are 2710 and 2659 respectively.  Alk phos is 180 and bilirubin is 13.3.  Patient has acute kidney injury with a creatinine of 1.94 and some electrolyte imbalance with sodium of 131, potassium of 3.1, chloride of 94.  White blood cell and hemoglobin are normal.  Magnesium normal.  CT of the abdomen and pelvis with contrast reveals slightly thickened gallbladder wall but no distention or evidence of acute cholecystitis.  No biliary ductal dilation or other new abnormality.  X-ray of the chest shows concern for possible spiculated opacity in the lower lung and a CT was recommended however as the patient now has kidney injury and just  received contrast for the CT abdomen, will hold on CT of the chest until patient is admitted.  Due to the liver dysfunction, I spoke with Dr. Paulita Fujita with gastroenterology and they feel that the patient needs to be transferred to a liver transplant center as he has a meld score of 32.  They also asked I confirm some questions with the patient he reports he has not had an alcoholic drink in 27 years and has not taken any Tylenol recently only medications he is taking was several doses of ibuprofen over the last several days.  Will try and get in touch with either [facility], [facility], or [facility] [place] which are [facility] transplant centers for transfer and evaluation.  Patient will be given more fluids for his soft blood pressures although blood pressure is now over 100 from the 80s initially.  Patient is currently in no distress and just feels tired.  Spoke with Dr. Manuella Ghazi with hepatology at [facility] who agrees the patient needs to be transferred for further evaluation.  They do not have a bed and they recommend transferring to the [facility] emergency department for evaluation.  They recommended initiation of N-acetylcysteine, checking a Tylenol level, UDS, hepatitis, and alcohol level.  Spoke with Dr. Verne Carrow in the emergency department who excepted the patient in transfer.  We will continue to monitor the patient's blood pressure mental status.  His blood pressure had improved to over 427 systolic  after 2 L of fluid.  Patient and family agreed with transfer for further work-up and management.  11:31 PM Patient's transport has arrived and he will be transported to [facility] for further management.  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 Fatigue, unspecified type   3. LFT elevation   4. Jaundice     Disposition: Transfer to  [facility] emergency department for hepatology evaluation and further management.  This note was prepared with assistance of Systems analyst. Occasional wrong-word or sound-a-like substitutions may have occurred due to the inherent limitations of voice recognition software.     Tegeler, Gwenyth Allegra, MD 05/16/19 [phone]

## 2019-05-16 NOTE — ED Notes (Signed)
Called [facility] at [phone] Report given to [place] and [employer]

## 2019-05-16 NOTE — Patient Instructions (Signed)
Please go to the ER  

## 2019-05-16 NOTE — ED Triage Notes (Signed)
Pt sent here from his PCP, Dr. Larose Kells. Pt has been having sudden episodes of diaphoresis, and generalized weakness. Pt also reporting a lack of appetite and weight loss. When pt was seen by his PCP they noticed hypotension and jaundice.

## 2019-05-16 NOTE — ED Notes (Signed)
Pt. Wife reports the Pt. Had a fever on last Friday low grade and was told to get an x ray on Monday.  Pt. Was told nothing was on his x ray and to see his PMD.  Pt. Had no blood work and saw his PMD today.  Pt. Was sent to ED for further care due to his status.

## 2019-05-17 DIAGNOSIS — S36119A Unspecified injury of liver, initial encounter: Secondary | ICD-10-CM

## 2019-05-17 DIAGNOSIS — N170 Acute kidney failure with tubular necrosis: Secondary | ICD-10-CM | POA: Insufficient documentation

## 2019-05-17 HISTORY — DX: Unspecified injury of liver, initial encounter: S36.119A

## 2019-05-17 LAB — HEPATITIS PANEL, ACUTE
HCV Ab: NONREACTIVE
Hep A IgM: REACTIVE — AB
Hep B C IgM: NONREACTIVE
Hepatitis B Surface Ag: NONREACTIVE

## 2019-05-17 LAB — URINE CULTURE: Culture: NO GROWTH

## 2019-05-17 MED ORDER — GENERIC EXTERNAL MEDICATION
10.00 | Status: DC
Start: 2019-05-18 — End: 2019-05-17

## 2019-05-17 MED ORDER — GENERIC EXTERNAL MEDICATION
5.00 | Status: DC
Start: ? — End: 2019-05-17

## 2019-05-17 MED ORDER — PANTOPRAZOLE SODIUM 40 MG IV SOLR
40.00 | INTRAVENOUS | Status: DC
Start: 2019-05-20 — End: 2019-05-17

## 2019-05-20 ENCOUNTER — Telehealth: Payer: Self-pay

## 2019-05-20 NOTE — Telephone Encounter (Signed)
Copied from [place] [id number]. Topic: General - Other >> May 20, 2019  2:35 PM Keene Breath wrote: Reason for CRM: Called to ask the doctor to call regarding patient's care.  CB# [phone]

## 2019-05-20 NOTE — Telephone Encounter (Signed)
Called back, they do not know Randall Hiss who is the person who left a message.

## 2019-05-21 LAB — CULTURE, BLOOD (ROUTINE X 2)
Culture: NO GROWTH
Special Requests: ADEQUATE

## 2019-05-22 ENCOUNTER — Telehealth: Payer: Self-pay | Admitting: Emergency Medicine

## 2019-05-22 NOTE — Telephone Encounter (Signed)
Post ED Visit - Positive Culture Follow-up  Culture report reviewed by antimicrobial stewardship pharmacist: [place] Team []  Elenor Quinones, Pharm.D. []  Heide Guile, Pharm.D., BCPS AQ-ID []  Parks Neptune, Pharm.D., BCPS []  Alycia Rossetti, Pharm.D., BCPS []  [place], Pharm.D., BCPS, AAHIVP []  Legrand Como, Pharm.D., BCPS, AAHIVP []  Salome Arnt, PharmD, BCPS []  Johnnette Gourd, PharmD, BCPS []  Hughes Better, PharmD, BCPS [x]  Elicia Lamp, PharmD []  Laqueta Linden, PharmD, BCPS []  Albertina Parr, PharmD  [place] Team []  Leodis Sias, PharmD []  Lindell Spar, PharmD []  Royetta Asal, PharmD []  Graylin Shiver, Rph []  Rema Fendt) Glennon Mac, PharmD []  Arlyn Dunning, PharmD []  Netta Cedars, PharmD []  Dia Sitter, PharmD []  Leone Haven, PharmD []  Gretta Arab, PharmD []  Theodis Shove, PharmD []  Peggyann Juba, PharmD []  Reuel Boom, PharmD   Positive blood culture Probable contaminate, pt transferred to [facility] no further patient follow-up is required at this time.  Sandi Raveling Haskel Dewalt 05/22/2019, 2:16 PM

## 2019-05-23 ENCOUNTER — Encounter: Payer: Self-pay | Admitting: Internal Medicine

## 2019-05-23 ENCOUNTER — Telehealth: Payer: Self-pay | Admitting: *Deleted

## 2019-05-23 NOTE — Telephone Encounter (Signed)
Transition Care Management Follow-up Telephone Call   Date discharged? 05/22/19   How have you been since you were released from the hospital? "I feel good"    Do you understand why you were in the hospital? yes   Do you understand the discharge instructions? yes   Where were you discharged to? Home w/ wife   Items Reviewed:  Medications reviewed: "they took me off of everything"  Allergies reviewed: yes  Dietary changes reviewed: yes  Referrals reviewed: yes   Functional Questionnaire:   Activities of Daily Living (ADLs):   He states they are independent in the following: ambulation, bathing and hygiene, feeding, continence, grooming, toileting and dressing States they require assistance with the following: na   Any transportation issues/concerns?: no   Any patient concerns? no   Confirmed importance and date/time of follow-up visits scheduled yes  Provider Appointment booked with PCP 05/25/19  Confirmed with patient if condition begins to worsen call PCP or go to the ER.  Patient was given the office number and encouraged to call back with question or concerns.  : yes

## 2019-05-24 ENCOUNTER — Other Ambulatory Visit: Payer: Self-pay

## 2019-05-25 ENCOUNTER — Ambulatory Visit (INDEPENDENT_AMBULATORY_CARE_PROVIDER_SITE_OTHER): Payer: Medicare Other | Admitting: Psychology

## 2019-05-25 ENCOUNTER — Encounter: Payer: Self-pay | Admitting: Internal Medicine

## 2019-05-25 ENCOUNTER — Ambulatory Visit (INDEPENDENT_AMBULATORY_CARE_PROVIDER_SITE_OTHER): Payer: Medicare Other | Admitting: Internal Medicine

## 2019-05-25 VITALS — BP 131/72 | HR 69 | Temp 96.5°F | Resp 18 | Ht 70.0 in | Wt 244.5 lb

## 2019-05-25 DIAGNOSIS — F4322 Adjustment disorder with anxiety: Secondary | ICD-10-CM | POA: Diagnosis not present

## 2019-05-25 DIAGNOSIS — I10 Essential (primary) hypertension: Secondary | ICD-10-CM

## 2019-05-25 DIAGNOSIS — E782 Mixed hyperlipidemia: Secondary | ICD-10-CM | POA: Diagnosis not present

## 2019-05-25 DIAGNOSIS — B159 Hepatitis A without hepatic coma: Secondary | ICD-10-CM

## 2019-05-25 NOTE — Patient Instructions (Addendum)
GO TO THE LAB : Get the blood work     GO TO THE FRONT DESK Schedule your next appointment   for a checkup in 4   week  Stop Lipitor  HOLD losartan HCT   Check the  blood pressure 4-5 times a week  BP GOAL is between 110/65 and  135/85  If your blood pressure start to be consistently more than 145/90 we will restart losartan HCT

## 2019-05-25 NOTE — Progress Notes (Signed)
Subjective:    Patient ID: Gregory Wilkins, male    DOB: 09-13-1940, 78 y.o.   MRN: [id number]  DOS:  05/25/2019 Type of visit - description: TCM 7, patient was discharged home 05/20/2019. Since the last office visit, he was sent to the ER with jaundice. It turns out that he had severe liver disease, was transferred to [facility] for the consideration of a liver transplant. Work-up however showed acute hepatitis a infection. LFTs were trending down. The plan at the time of discharge was to follow-up with me and with the hepatology service in 2 weeks.  Other issues include gram-positive rods in the blood cultures felt to be likely a contaminant Prerenal AKI, last creatinine improving. Desats while sleeping, OSA?. HTN: Hospital team holding Hyzaar.  Labs 05/20/2019: Sodium 135, potassium 3.3, creatinine 0.9. Total bilirubin 14.0. AST 1038 ALT 1908  Review of Systems Since he left the hospital he is feeling well. Fatigue decreasing. Urine looks less brown He is making an effort to drink plenty of water. No fever chills Appetite is okay No nausea, vomiting, diarrhea.  No blood in the stools.  No abdominal pain. Ambulatory BPs reviewed  Past Medical History:  Diagnosis Date  . Allergic rhinitis   . BPH (benign prostatic hyperplasia)   . 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 Diverticula of colon   . Hepatitis A 04/2019  . Hypertension   . Liver injury 04/2019  . Mallet finger 2012     4th L DIP,Dr Sypher  . Nephrolithiasis   . RLS (restless legs syndrome)     Past Surgical History:  Procedure Laterality Date  . basal cell cancer     X 3  . INGUINAL HERNIA REPAIR  45,51   right,left  . OPEN REDUCTION INTERNAL FIXATION (ORIF) METACARPAL Right 05/19/2013   Procedure: OPEN REDUCTION INTERNAL FIXATION  RIGHT SMALL  METACARPAL FRACTURE;  Surgeon: Cammie Sickle., MD;  Location: [place];  Service: Orthopedics;  Laterality: Right;  .  POLYPECTOMY     X 3; Dr Earlean Shawl  . WISDOM TOOTH EXTRACTION      Social History   Socioeconomic History  . Marital status: Married    Spouse name: Butch Penny   . Number of children: Not on file  . Years of education: Not on file  . Highest education level: Not on file  Occupational History  . Occupation: retired---Financial Administrator  Social Needs  . Financial resource strain: Not on file  . Food insecurity    Worry: Not on file    Inability: Not on file  . Transportation needs    Medical: Not on file    Non-medical: Not on file  Tobacco Use  . Smoking status: Former Smoker    Packs/day: 2.00    Years: 26.00    Pack years: 52.00    Types: Cigarettes    Quit date: 07/29/1982    Years since quitting: 36.8  . Smokeless tobacco: Never Used  . Tobacco comment: ages [age], up to 2 ppd  Substance and Sexual Activity  . Alcohol use: No    Alcohol/week: 0.0 standard drinks  . Drug use: No  . Sexual activity: Not on file  Lifestyle  . Physical activity    Days per week: Not on file    Minutes per session: Not on file  . Stress: Not on file  Relationships  . Social connections    Talks on phone: Not on file  Gets together: Not on file    Attends religious service: Not on file    Active member of club or organization: Not on file    Attends meetings of clubs or organizations: Not on file    Relationship status: Not on file  . Intimate partner violence    Fear of current or ex partner: Not on file    Emotionally abused: Not on file    Physically abused: Not on file    Forced sexual activity: Not on file  Other Topics Concern  . Not on file  Social History Narrative   Household: pt, wife, child      Allergies as of 05/25/2019      Reactions   Penicillins    hives   Ramipril Anaphylaxis, Swelling      Medication List       Accurate as of May 25, 2019 11:59 PM. If you have any questions, ask your nurse or doctor.        STOP taking these medications    atorvastatin 10 MG tablet Commonly known as: LIPITOR Stopped by: Kathlene November, MD   cetirizine 10 MG tablet Commonly known as: ZYRTEC Stopped by: Kathlene November, MD     TAKE these medications   clonazePAM 0.5 MG tablet Commonly known as: KLONOPIN TAKE 1 TABLET BY MOUTH EVERY NIGHT AT BEDTIME AS NEEDED FOR RESTLESS LEG   fluocinolone 0.01 % external solution Commonly known as: SYNALAR Apply topically 2 (two) times daily.   ICAPS AREDS 2 PO Take by mouth.   losartan-hydrochlorothiazide 50-12.5 MG tablet Commonly known as: HYZAAR Take 1 tablet by mouth daily.   promethazine-dextromethorphan 6.25-15 MG/5ML syrup Commonly known as: PROMETHAZINE-DM Take 5 mLs by mouth 4 (four) times daily as needed for cough.           Objective:   Physical Exam BP 131/72 (BP Location: Left Arm, Patient Position: Sitting, Cuff Size: Small)   Pulse 69   Temp (!) 96.5 F (35.8 C) (Temporal)   Resp 18   Ht 5\' 10"  (1.778 m)   Wt 244 lb 8 oz (110.9 kg)   SpO2 98%   BMI 35.08 kg/m  General:   Well developed, NAD, BMI noted.  HEENT:  Normocephalic . Face symmetric, atraumatic.  Significant jaundice Lungs:  CTA B Normal respiratory effort, no intercostal retractions, no accessory muscle use. Heart: RRR,  no murmur.  Trace pretibial edema bilaterally  Abdomen:  Not distended, soft, non-tender. No rebound or rigidity.  No organomegaly Skin: Not pale. Not jaundice.  Significant jaundice noted Neurologic:  alert & oriented X3.  Speech normal, gait appropriate for age and unassisted Psych--  Cognition and judgment appear intact.  Cooperative with normal attention span and concentration.  Behavior appropriate. No anxious or depressed appearing.      Assessment     Assessment (new patient 05/17/2019) HTN High Cholesterol Restless leg (clonazepam prn) Physiologic bradycardia Colonoscopy 02-2019 H/o kidney stone   PLAN Acute hepatitis A Since the last office visit, he was diagnosed with  acute hepatitis A, he was in the hospital several days, released home 05/20/2019. Clinically he is improving, he still jaundiced. We reviewed the fact that he should not be taking Lipitor, Tylenol, ibuprofen or alcohol. We will check a CMP, CBC, PT, PTT Continue resting for a couple of weeks then go gradually to normal activities Has a follow-up with [facility] hematology (the patient said is schedule in 2 months). Will see him in 4 weeks. HTN: Holding losartan,  ambulatory BPs are normal but trending up, a couple of days ago 118/70, today 145/79.  Restart losartan if needed, see AVS High cholesterol: Stop Lipitor  OSA?  O2 desats while in the hospital during sleep, will refer for sleep study at some point in the near future. Restless leg: Okay to take sporadic clonazepam RTC 4 weeks.

## 2019-05-25 NOTE — Progress Notes (Signed)
Pre visit review using our clinic review tool, if applicable. No additional management support is needed unless otherwise documented below in the visit note. 

## 2019-05-26 DIAGNOSIS — Z09 Encounter for follow-up examination after completed treatment for conditions other than malignant neoplasm: Secondary | ICD-10-CM

## 2019-05-26 HISTORY — DX: Encounter for follow-up examination after completed treatment for conditions other than malignant neoplasm: Z09

## 2019-05-26 LAB — CBC WITH DIFFERENTIAL/PLATELET
Basophils Absolute: 0.1 10*3/uL (ref 0.0–0.1)
Basophils Relative: 1.2 % (ref 0.0–3.0)
Eosinophils Absolute: 0.1 10*3/uL (ref 0.0–0.7)
Eosinophils Relative: 1.1 % (ref 0.0–5.0)
HCT: 38 % — ABNORMAL LOW (ref 39.0–52.0)
Hemoglobin: 12.8 g/dL — ABNORMAL LOW (ref 13.0–17.0)
Lymphocytes Relative: 21.5 % (ref 12.0–46.0)
Lymphs Abs: 1.1 10*3/uL (ref 0.7–4.0)
MCHC: 33.8 g/dL (ref 30.0–36.0)
MCV: 86.9 fl (ref 78.0–100.0)
Monocytes Absolute: 0.6 10*3/uL (ref 0.1–1.0)
Monocytes Relative: 12.4 % — ABNORMAL HIGH (ref 3.0–12.0)
Neutro Abs: 3.3 10*3/uL (ref 1.4–7.7)
Neutrophils Relative %: 63.8 % (ref 43.0–77.0)
Platelets: 175 10*3/uL (ref 150.0–400.0)
RBC: 4.37 Mil/uL (ref 4.22–5.81)
RDW: 15.9 % — ABNORMAL HIGH (ref 11.5–15.5)
WBC: 5.2 10*3/uL (ref 4.0–10.5)

## 2019-05-26 LAB — COMPREHENSIVE METABOLIC PANEL
ALT: 455 U/L — ABNORMAL HIGH (ref 0–53)
AST: 195 U/L — ABNORMAL HIGH (ref 0–37)
Albumin: 3.3 g/dL — ABNORMAL LOW (ref 3.5–5.2)
Alkaline Phosphatase: 136 U/L — ABNORMAL HIGH (ref 39–117)
BUN: 14 mg/dL (ref 6–23)
CO2: 27 mEq/L (ref 19–32)
Calcium: 8.6 mg/dL (ref 8.4–10.5)
Chloride: 101 mEq/L (ref 96–112)
Creatinine, Ser: 1.07 mg/dL (ref 0.40–1.50)
GFR: 67.13 mL/min (ref >60.00)
Glucose, Bld: 100 mg/dL — ABNORMAL HIGH (ref 70–99)
Potassium: 4 mEq/L (ref 3.5–5.1)
Sodium: 134 mEq/L — ABNORMAL LOW (ref 135–145)
Total Bilirubin: 25.5 mg/dL — ABNORMAL HIGH (ref 0.2–1.2)
Total Protein: 6.9 g/dL (ref 6.0–8.3)

## 2019-05-26 LAB — PROTIME-INR
INR: 1.4 ratio — ABNORMAL HIGH (ref 0.8–1.0)
Prothrombin Time: 15.8 s — ABNORMAL HIGH (ref 9.6–13.1)

## 2019-05-26 LAB — APTT: aPTT: 27.6 s (ref 23.4–32.7)

## 2019-05-26 NOTE — Assessment & Plan Note (Signed)
Acute hepatitis A Since the last office visit, he was diagnosed with acute hepatitis A, he was in the hospital several days, released home 05/20/2019. Clinically he is improving, he still jaundiced. We reviewed the fact that he should not be taking Lipitor, Tylenol, ibuprofen or alcohol. We will check a CMP, CBC, PT, PTT Continue resting for a couple of weeks then go gradually to normal activities Has a follow-up with [facility] hematology (the patient said is schedule in 2 months). Will see him in 4 weeks. HTN: Holding losartan, ambulatory BPs are normal but trending up, a couple of days ago 118/70, today 145/79.  Restart losartan if needed, see AVS High cholesterol: Stop Lipitor  OSA?  O2 desats while in the hospital during sleep, will refer for sleep study at some point in the near future. Restless leg: Okay to take sporadic clonazepam RTC 4 weeks.

## 2019-06-08 ENCOUNTER — Ambulatory Visit (INDEPENDENT_AMBULATORY_CARE_PROVIDER_SITE_OTHER): Payer: Medicare Other | Admitting: Psychology

## 2019-06-08 DIAGNOSIS — F4322 Adjustment disorder with anxiety: Secondary | ICD-10-CM

## 2019-06-15 ENCOUNTER — Other Ambulatory Visit: Payer: Self-pay

## 2019-06-15 DIAGNOSIS — Z20822 Contact with and (suspected) exposure to covid-19: Secondary | ICD-10-CM

## 2019-06-17 LAB — NOVEL CORONAVIRUS, NAA: SARS-CoV-2, NAA: NOT DETECTED

## 2019-06-20 ENCOUNTER — Other Ambulatory Visit: Payer: Self-pay

## 2019-06-21 ENCOUNTER — Ambulatory Visit (INDEPENDENT_AMBULATORY_CARE_PROVIDER_SITE_OTHER): Payer: Medicare Other | Admitting: Internal Medicine

## 2019-06-21 ENCOUNTER — Other Ambulatory Visit: Payer: Self-pay

## 2019-06-21 ENCOUNTER — Encounter: Payer: Self-pay | Admitting: Internal Medicine

## 2019-06-21 VITALS — BP 130/70 | HR 69 | Temp 96.6°F | Resp 18 | Ht 70.0 in | Wt 236.5 lb

## 2019-06-21 DIAGNOSIS — R634 Abnormal weight loss: Secondary | ICD-10-CM | POA: Diagnosis not present

## 2019-06-21 DIAGNOSIS — B159 Hepatitis A without hepatic coma: Secondary | ICD-10-CM

## 2019-06-21 DIAGNOSIS — G4734 Idiopathic sleep related nonobstructive alveolar hypoventilation: Secondary | ICD-10-CM

## 2019-06-21 DIAGNOSIS — R17 Unspecified jaundice: Secondary | ICD-10-CM

## 2019-06-21 LAB — COMPREHENSIVE METABOLIC PANEL
ALT: 107 U/L — ABNORMAL HIGH (ref 0–53)
AST: 102 U/L — ABNORMAL HIGH (ref 0–37)
Albumin: 2.9 g/dL — ABNORMAL LOW (ref 3.5–5.2)
Alkaline Phosphatase: 127 U/L — ABNORMAL HIGH (ref 39–117)
BUN: 10 mg/dL (ref 6–23)
CO2: 28 mEq/L (ref 19–32)
Calcium: 8.7 mg/dL (ref 8.4–10.5)
Chloride: 107 mEq/L (ref 96–112)
Creatinine, Ser: 0.81 mg/dL (ref 0.40–1.50)
GFR: 92.55 mL/min (ref >60.00)
Glucose, Bld: 78 mg/dL (ref 70–99)
Potassium: 4.1 mEq/L (ref 3.5–5.1)
Sodium: 138 mEq/L (ref 135–145)
Total Bilirubin: 5.2 mg/dL — ABNORMAL HIGH (ref 0.2–1.2)
Total Protein: 7 g/dL (ref 6.0–8.3)

## 2019-06-21 LAB — CBC WITH DIFFERENTIAL/PLATELET
Basophils Absolute: 0.1 10*3/uL (ref 0.0–0.1)
Basophils Relative: 1.4 % (ref 0.0–3.0)
Eosinophils Absolute: 0.1 10*3/uL (ref 0.0–0.7)
Eosinophils Relative: 2.4 % (ref 0.0–5.0)
HCT: 37.5 % — ABNORMAL LOW (ref 39.0–52.0)
Hemoglobin: 12.6 g/dL — ABNORMAL LOW (ref 13.0–17.0)
Lymphocytes Relative: 22.6 % (ref 12.0–46.0)
Lymphs Abs: 1.1 10*3/uL (ref 0.7–4.0)
MCHC: 33.7 g/dL (ref 30.0–36.0)
MCV: 93.4 fl (ref 78.0–100.0)
Monocytes Absolute: 0.7 10*3/uL (ref 0.1–1.0)
Monocytes Relative: 14.5 % — ABNORMAL HIGH (ref 3.0–12.0)
Neutro Abs: 2.8 10*3/uL (ref 1.4–7.7)
Neutrophils Relative %: 59.1 % (ref 43.0–77.0)
Platelets: 185 10*3/uL (ref 150.0–400.0)
RBC: 4.02 Mil/uL — ABNORMAL LOW (ref 4.22–5.81)
RDW: 20.2 % — ABNORMAL HIGH (ref 11.5–15.5)
WBC: 4.7 10*3/uL (ref 4.0–10.5)

## 2019-06-21 LAB — TSH: TSH: 1.84 u[IU]/mL (ref 0.35–4.50)

## 2019-06-21 LAB — PROTIME-INR
INR: 1.4 ratio — ABNORMAL HIGH (ref 0.8–1.0)
Prothrombin Time: 15.9 s — ABNORMAL HIGH (ref 9.6–13.1)

## 2019-06-21 LAB — APTT: aPTT: 30.8 s (ref 23.4–32.7)

## 2019-06-21 NOTE — Progress Notes (Signed)
Pre visit review using our clinic review tool, if applicable. No additional management support is needed unless otherwise documented below in the visit note. 

## 2019-06-21 NOTE — Patient Instructions (Addendum)
Please schedule Medicare Wellness with Glenard Haring.   GO TO THE LAB : Get the blood work     GO TO THE FRONT DESK Schedule your next appointment   For a check up in 3 months    Continue checking your blood pressures BP GOAL is between 110/65 and  135/85. If it is consistently higher or lower, let me know

## 2019-06-21 NOTE — Progress Notes (Signed)
Subjective:    Patient ID: Gregory Wilkins, male    DOB: [birth year], 78 y.o.   MRN: [id number]  DOS:  06/21/2019 Type of visit - description: Routine office visit History of hepatitis A: Feeling well, [facility] appointment pending HTN: Holding medications, ambulatory BPs 130s, 140s. High cholesterol: Holding Lipitor Hypoxia: Asymptomatic. Weight loss noted, reports she is doing great, has changed his diet, eating healthier, has increased his physical activity.  Wt Readings from Last 3 Encounters:  06/21/19 236 lb 8 oz (107.3 kg)  05/25/19 244 lb 8 oz (110.9 kg)  05/16/19 241 lb 2 oz (109.4 kg)     Review of Systems No fever chills No nausea, vomiting, diarrhea Reports chronic lower extremity edema, worse on the left.  Denies chest pain, difficulty breathing, orthopnea.   Past Medical History:  Diagnosis Date  . Allergic rhinitis   . BPH (benign prostatic hyperplasia)   . 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 Diverticula of colon   . Hepatitis A 04/2019  . Hypertension   . Liver injury 04/2019  . Mallet finger 2012     4th L DIP,Dr Sypher  . Nephrolithiasis   . RLS (restless legs syndrome)     Past Surgical History:  Procedure Laterality Date  . basal cell cancer     X 3  . INGUINAL HERNIA REPAIR  45,51   right,left  . OPEN REDUCTION INTERNAL FIXATION (ORIF) METACARPAL Right 05/19/2013   Procedure: OPEN REDUCTION INTERNAL FIXATION  RIGHT SMALL  METACARPAL FRACTURE;  Surgeon: Cammie Sickle., MD;  Location: [place];  Service: Orthopedics;  Laterality: Right;  . POLYPECTOMY     X 3; Dr Earlean Shawl  . WISDOM TOOTH EXTRACTION      Social History   Socioeconomic History  . Marital status: Married    Spouse name: Butch Penny   . Number of children: Not on file  . Years of education: Not on file  . Highest education level: Not on file  Occupational History  . Occupation: retired---Financial Administrator  Social Needs  . Financial resource  strain: Not on file  . Food insecurity    Worry: Not on file    Inability: Not on file  . Transportation needs    Medical: Not on file    Non-medical: Not on file  Tobacco Use  . Smoking status: Former Smoker    Packs/day: 2.00    Years: 26.00    Pack years: 52.00    Types: Cigarettes    Quit date: 07/29/1982    Years since quitting: 36.9  . Smokeless tobacco: Never Used  . Tobacco comment: ages [age], up to 2 ppd  Substance and Sexual Activity  . Alcohol use: No    Alcohol/week: 0.0 standard drinks  . Drug use: No  . Sexual activity: Not on file  Lifestyle  . Physical activity    Days per week: Not on file    Minutes per session: Not on file  . Stress: Not on file  Relationships  . Social Herbalist on phone: Not on file    Gets together: Not on file    Attends religious service: Not on file    Active member of club or organization: Not on file    Attends meetings of clubs or organizations: Not on file    Relationship status: Not on file  . Intimate partner violence    Fear of current or ex partner: Not  on file    Emotionally abused: Not on file    Physically abused: Not on file    Forced sexual activity: Not on file  Other Topics Concern  . Not on file  Social History Narrative   Household: pt, wife, child      Allergies as of 06/21/2019      Reactions   Penicillins    hives   Ramipril Anaphylaxis, Swelling      Medication List       Accurate as of June 21, 2019 11:59 PM. If you have any questions, ask your nurse or doctor.        STOP taking these medications   losartan-hydrochlorothiazide 50-12.5 MG tablet Commonly known as: HYZAAR Stopped by: Kathlene November, MD     TAKE these medications   cetirizine 10 MG tablet Commonly known as: ZYRTEC Take 10 mg by mouth daily.   clonazePAM 0.5 MG tablet Commonly known as: KLONOPIN TAKE 1 TABLET BY MOUTH EVERY NIGHT AT BEDTIME AS NEEDED FOR RESTLESS LEG   fluocinolone 0.01 % external solution  Commonly known as: SYNALAR Apply topically 2 (two) times daily.   ICAPS AREDS 2 PO Take by mouth.   promethazine-dextromethorphan 6.25-15 MG/5ML syrup Commonly known as: PROMETHAZINE-DM Take 5 mLs by mouth 4 (four) times daily as needed for cough.           Objective:   Physical Exam BP 130/70 (BP Location: Left Arm, Patient Position: Sitting, Cuff Size: Normal)   Pulse 69   Temp (!) 96.6 F (35.9 C) (Temporal)   Resp 18   Ht 5\' 10"  (1.778 m)   Wt 236 lb 8 oz (107.3 kg)   SpO2 100%   BMI 33.93 kg/m  General:   Well developed, NAD, BMI noted.  HEENT:  Normocephalic . Face symmetric, atraumatic.  Still mildly icteric conjunctiva's Lungs:  CTA B Normal respiratory effort, no intercostal retractions, no accessory muscle use. Heart: RRR,  no murmur.  Trace to +/+++ pretibial and dorsal foot edema L>R Abdomen:  Not distended, soft, non-tender. No rebound or rigidity.  No organomegaly Skin: Not pale.  Still jaundice in some areas. Neurologic:  alert & oriented X3.  Speech normal, gait appropriate for age and unassisted Psych--  Cognition and judgment appear intact.  Cooperative with normal attention span and concentration.  Behavior appropriate. No anxious or depressed appearing.     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 HTN: Not taking Hyzaar (as advised) ambulatory BPs still satisfactory, BP today here 130/70.  Heart rate in the 60s and occasionally 50s continue ambulatory BPs, restart Hyzaar if needed High cholesterol: Lipitor stopped for now OSA?  O2 sat decreased at night while he was in the hospital, refer to pulmonary.  Work-up?Marland Kitchen Weight loss: Doing great with diet and exercise, check TSH for completeness RLS: Has  not needed clonazepam RTC 3 months   This visit occurred during the SARS-CoV-2 public health emergency.  Safety protocols were in place, including screening questions prior to the visit, additional usage of staff PPE, and extensive cleaning of exam room while observing appropriate contact time as indicated for disinfecting solutions.

## 2019-06-22 ENCOUNTER — Ambulatory Visit (INDEPENDENT_AMBULATORY_CARE_PROVIDER_SITE_OTHER): Payer: Medicare Other | Admitting: Psychology

## 2019-06-22 DIAGNOSIS — F4322 Adjustment disorder with anxiety: Secondary | ICD-10-CM | POA: Diagnosis not present

## 2019-06-22 NOTE — Assessment & Plan Note (Signed)
Recent hepatitis A: Seems to be recuperating well, will check a CMP, CBC, PT, PTT. HTN: Not taking Hyzaar (as advised) ambulatory BPs still satisfactory, BP today here 130/70.  Heart rate in the 60s and occasionally 50s continue ambulatory BPs, restart Hyzaar if needed High cholesterol: Lipitor stopped for now OSA?  O2 sat decreased at night while he was in the hospital, refer to pulmonary.  Work-up?Marland Kitchen Weight loss: Doing great with diet and exercise, check TSH for completeness RLS: Has not needed clonazepam RTC 3 months

## 2019-07-02 ENCOUNTER — Encounter: Payer: Self-pay | Admitting: Internal Medicine

## 2019-07-02 DIAGNOSIS — I10 Essential (primary) hypertension: Secondary | ICD-10-CM

## 2019-07-05 ENCOUNTER — Telehealth: Payer: Self-pay | Admitting: Internal Medicine

## 2019-07-05 NOTE — Telephone Encounter (Signed)
-----   Message from Colon Branch, MD sent at 07/04/2019  2:20 PM EST ----- Regarding: labs Enter order for a BMP in 2 weeksSherri, please make a lab appointment in 2 weeks approximately

## 2019-07-05 NOTE — Telephone Encounter (Signed)
Called pt lvm to call back for labs in 2 weeks

## 2019-07-06 ENCOUNTER — Ambulatory Visit (INDEPENDENT_AMBULATORY_CARE_PROVIDER_SITE_OTHER): Payer: Medicare Other | Admitting: Psychology

## 2019-07-06 DIAGNOSIS — F4322 Adjustment disorder with anxiety: Secondary | ICD-10-CM | POA: Diagnosis not present

## 2019-07-20 ENCOUNTER — Ambulatory Visit (INDEPENDENT_AMBULATORY_CARE_PROVIDER_SITE_OTHER): Payer: Medicare Other | Admitting: Psychology

## 2019-07-20 DIAGNOSIS — F4322 Adjustment disorder with anxiety: Secondary | ICD-10-CM | POA: Diagnosis not present

## 2019-08-03 ENCOUNTER — Ambulatory Visit (INDEPENDENT_AMBULATORY_CARE_PROVIDER_SITE_OTHER): Payer: Medicare Other | Admitting: Psychology

## 2019-08-03 DIAGNOSIS — F4322 Adjustment disorder with anxiety: Secondary | ICD-10-CM | POA: Diagnosis not present

## 2019-08-10 ENCOUNTER — Ambulatory Visit: Payer: Medicare Other | Attending: Internal Medicine

## 2019-08-10 DIAGNOSIS — Z23 Encounter for immunization: Secondary | ICD-10-CM

## 2019-08-10 NOTE — Progress Notes (Signed)
   Covid-19 Vaccination Clinic  Name:  [name]    MRN: [id number] DOB: 1941/06/11  08/10/2019  [name] was observed post Covid-19 immunization for 30 minutes based on pre-vaccination screening without incidence. He was provided with Vaccine Information Sheet and instruction to access the V-Safe system.   [name] was instructed to call 911 with any severe reactions post vaccine: Marland Kitchen Difficulty breathing  . Swelling of your face and throat  . A fast heartbeat  . A bad rash all over your body  . Dizziness and weakness    Immunizations Administered    Name Date Dose VIS Date Route   Pfizer COVID-19 Vaccine 08/10/2019  8:36 AM 0.3 mL 07/08/2019 Intramuscular   Manufacturer: [place]   Lot: [id number]   [place]: [id number]

## 2019-08-17 ENCOUNTER — Ambulatory Visit (INDEPENDENT_AMBULATORY_CARE_PROVIDER_SITE_OTHER): Payer: Medicare Other | Admitting: Psychology

## 2019-08-17 DIAGNOSIS — F4322 Adjustment disorder with anxiety: Secondary | ICD-10-CM

## 2019-08-30 ENCOUNTER — Ambulatory Visit: Payer: Medicare Other | Attending: Internal Medicine

## 2019-08-30 DIAGNOSIS — Z23 Encounter for immunization: Secondary | ICD-10-CM | POA: Insufficient documentation

## 2019-08-30 NOTE — Progress Notes (Signed)
   Covid-19 Vaccination Clinic  Name:  Gregory Wilkins    MRN: [id number] DOB: 1940/12/28  08/30/2019  [name] was observed post Covid-19 immunization for 30 minutes based on pre-vaccination screening without incidence. He was provided with Vaccine Information Sheet and instruction to access the V-Safe system.   [name] was instructed to call 911 with any severe reactions post vaccine: Marland Kitchen Difficulty breathing  . Swelling of your face and throat  . A fast heartbeat  . A bad rash all over your body  . Dizziness and weakness    Immunizations Administered    Name Date Dose VIS Date Route   Pfizer COVID-19 Vaccine 08/30/2019  8:27 AM 0.3 mL 07/08/2019 Intramuscular   Manufacturer: [place]   Lot: [id number]   [place]: [id number]

## 2019-08-31 ENCOUNTER — Ambulatory Visit: Payer: Medicare Other | Admitting: Psychology

## 2019-09-14 ENCOUNTER — Ambulatory Visit (INDEPENDENT_AMBULATORY_CARE_PROVIDER_SITE_OTHER): Payer: Medicare Other | Admitting: Psychology

## 2019-09-14 DIAGNOSIS — F4322 Adjustment disorder with anxiety: Secondary | ICD-10-CM | POA: Diagnosis not present

## 2019-09-23 ENCOUNTER — Other Ambulatory Visit: Payer: Self-pay

## 2019-09-26 ENCOUNTER — Other Ambulatory Visit: Payer: Self-pay

## 2019-09-26 ENCOUNTER — Ambulatory Visit (INDEPENDENT_AMBULATORY_CARE_PROVIDER_SITE_OTHER): Payer: Medicare Other | Admitting: Internal Medicine

## 2019-09-26 ENCOUNTER — Encounter: Payer: Self-pay | Admitting: Internal Medicine

## 2019-09-26 VITALS — BP 121/66 | HR 57 | Temp 95.7°F | Resp 18 | Ht 70.0 in | Wt 236.4 lb

## 2019-09-26 DIAGNOSIS — E782 Mixed hyperlipidemia: Secondary | ICD-10-CM

## 2019-09-26 DIAGNOSIS — G4734 Idiopathic sleep related nonobstructive alveolar hypoventilation: Secondary | ICD-10-CM | POA: Diagnosis not present

## 2019-09-26 DIAGNOSIS — B159 Hepatitis A without hepatic coma: Secondary | ICD-10-CM

## 2019-09-26 DIAGNOSIS — I10 Essential (primary) hypertension: Secondary | ICD-10-CM | POA: Diagnosis not present

## 2019-09-26 NOTE — Patient Instructions (Addendum)
Please schedule Medicare Wellness with Glenard Haring.    GO TO THE FRONT DESK Come back for a checkup in 4 months, please make an appointment  Continue checking your blood pressure BP GOAL is between 110/65 and  135/85. If it is consistently higher or lower, let me know  We are referring you to the pulmonary doctors.  Please call them at your earliest convenience The number is [phone]

## 2019-09-26 NOTE — Progress Notes (Signed)
Pre visit review using our clinic review tool, if applicable. No additional management support is needed unless otherwise documented below in the visit note. 

## 2019-09-26 NOTE — Progress Notes (Signed)
Subjective:    Patient ID: Gregory Wilkins, male    DOB: 31-Aug-1940, 79 y.o.   MRN: [id number]  DOS:  09/26/2019 Type of visit - description: Routine checkup Since the last office visit, he is doing well.  Went to see GI: Note reviewed  Has developed a flareup of neck pain, located at the posterior neck, some radiation to the right trapezoid, mostly at night, decreased with the stretching. Denies any paresthesias. No recent injuries.   Wt Readings from Last 3 Encounters:  09/26/19 236 lb 6 oz (107.2 kg)  06/21/19 236 lb 8 oz (107.3 kg)  05/25/19 244 lb 8 oz (110.9 kg)     Review of Systems Denies chest pain, difficulty breathing.  Mild edema at baseline No cough  Past Medical History:  Diagnosis Date  . Allergic rhinitis   . BPH (benign prostatic hyperplasia)   . 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 Diverticula of colon   . Hepatitis A 04/2019  . Hypertension   . Liver injury 04/2019  . Mallet finger 2012     4th L DIP,Dr Sypher  . Nephrolithiasis   . RLS (restless legs syndrome)     Past Surgical History:  Procedure Laterality Date  . basal cell cancer     X 3  . INGUINAL HERNIA REPAIR  45,51   right,left  . OPEN REDUCTION INTERNAL FIXATION (ORIF) METACARPAL Right 05/19/2013   Procedure: OPEN REDUCTION INTERNAL FIXATION  RIGHT SMALL  METACARPAL FRACTURE;  Surgeon: Cammie Sickle., MD;  Location: [place];  Service: Orthopedics;  Laterality: Right;  . POLYPECTOMY     X 3; Dr Earlean Shawl  . WISDOM TOOTH EXTRACTION      Allergies as of 09/26/2019      Reactions   Penicillins    hives   Ramipril Anaphylaxis, Swelling      Medication List       Accurate as of September 26, 2019  9:26 AM. If you have any questions, ask your nurse or doctor.        cetirizine 10 MG tablet Commonly known as: ZYRTEC Take 10 mg by mouth daily.   clonazePAM 0.5 MG tablet Commonly known as: KLONOPIN TAKE 1 TABLET BY MOUTH EVERY NIGHT AT  BEDTIME AS NEEDED FOR RESTLESS LEG   fluocinolone 0.01 % external solution Commonly known as: SYNALAR Apply topically 2 (two) times daily.   ICAPS AREDS 2 PO Take by mouth.   multivitamin with minerals Tabs tablet Take 1 tablet by mouth daily.   promethazine-dextromethorphan 6.25-15 MG/5ML syrup Commonly known as: PROMETHAZINE-DM Take 5 mLs by mouth 4 (four) times daily as needed for cough.             Objective:   Physical Exam BP 121/66 (BP Location: Left Arm, Patient Position: Sitting, Cuff Size: Normal)   Pulse (!) 57   Temp (!) 95.7 F (35.4 C) (Temporal)   Resp 18   Ht 5\' 10"  (1.778 m)   Wt 236 lb 6 oz (107.2 kg)   SpO2 97%   BMI 33.92 kg/m  General:   Well developed, NAD, BMI noted.  HEENT:  Normocephalic . Face symmetric, atraumatic Lungs:  Rhonchi, no wheezing. Normal respiratory effort, no intercostal retractions, no accessory muscle use. Heart: RRR, nomurmur.  Abdomen:  Not distended, soft, non-tender. No rebound or rigidity.  No organomegaly Skin: Not pale. Not jaundice Lower extremities: +/+++ pretibial edema bilaterally  Neurologic:  alert & oriented  X3.  Speech normal, gait appropriate for age and unassisted Psych--  Cognition and judgment appear intact.  Cooperative with normal attention span and concentration.  Behavior appropriate. No anxious or depressed appearing.     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 They will consider a liver biopsy.  He has no follow-up set up, revisit the issue in 4 months HTN: Early December he went back to Hyzaar, subsequent creatinine and potassium normal Ambulatory BPs in the 130s.   No change High cholesterol: Continue holding statins. Nocturnal hypoxia: Refer to pulmonary failed, will try again Lower extremity edema: At baseline Neck pain: No red flag symptoms, responds very well to stretching, recommend to stretch regularly, okay to take 1 or 2 Tylenols a day sporadically.  Call if not better.  PT? RTC 4 months   this visit occurred during the SARS-CoV-2 public health emergency.  Safety protocols were in place, including screening questions prior to the visit, additional usage of staff PPE, and extensive cleaning of exam room while observing appropriate contact time as indicated for disinfecting solutions.

## 2019-09-27 NOTE — Assessment & Plan Note (Signed)
Hepatitis A Saw GI 07/27/2019: CBC normal, potassium 3.8, creatinine 0.9, AST 130, ALT 164, alkaline phosphatase 129 (elevated). They will consider a liver biopsy.  He has no follow-up set up, revisit the issue in 4 months HTN: Early December he went back to Hyzaar, subsequent creatinine and potassium normal Ambulatory BPs in the 130s.  No change High cholesterol: Continue holding statins. Nocturnal hypoxia: Refer to pulmonary failed, will try again Lower extremity edema: At baseline Neck pain: No red flag symptoms, responds very well to stretching, recommend to stretch regularly, okay to take 1 or 2 Tylenols a day sporadically.  Call if not better.  PT? RTC 4 months

## 2019-09-28 ENCOUNTER — Ambulatory Visit (INDEPENDENT_AMBULATORY_CARE_PROVIDER_SITE_OTHER): Payer: Medicare Other | Admitting: Psychology

## 2019-09-28 DIAGNOSIS — F4322 Adjustment disorder with anxiety: Secondary | ICD-10-CM

## 2019-10-12 ENCOUNTER — Ambulatory Visit (INDEPENDENT_AMBULATORY_CARE_PROVIDER_SITE_OTHER): Payer: Medicare Other | Admitting: Psychology

## 2019-10-12 DIAGNOSIS — F4322 Adjustment disorder with anxiety: Secondary | ICD-10-CM | POA: Diagnosis not present

## 2019-10-14 ENCOUNTER — Institutional Professional Consult (permissible substitution): Payer: Medicare Other | Admitting: Internal Medicine

## 2019-10-26 ENCOUNTER — Ambulatory Visit (INDEPENDENT_AMBULATORY_CARE_PROVIDER_SITE_OTHER): Payer: Medicare Other | Admitting: Psychology

## 2019-10-26 DIAGNOSIS — F4322 Adjustment disorder with anxiety: Secondary | ICD-10-CM | POA: Diagnosis not present

## 2019-11-04 ENCOUNTER — Ambulatory Visit (INDEPENDENT_AMBULATORY_CARE_PROVIDER_SITE_OTHER): Payer: Medicare Other | Admitting: Pulmonary Disease

## 2019-11-04 ENCOUNTER — Other Ambulatory Visit: Payer: Self-pay

## 2019-11-04 ENCOUNTER — Encounter: Payer: Self-pay | Admitting: Pulmonary Disease

## 2019-11-04 VITALS — BP 124/60 | HR 64 | Temp 97.9°F | Ht 70.0 in | Wt 232.4 lb

## 2019-11-04 DIAGNOSIS — G4734 Idiopathic sleep related nonobstructive alveolar hypoventilation: Secondary | ICD-10-CM | POA: Insufficient documentation

## 2019-11-04 DIAGNOSIS — G4733 Obstructive sleep apnea (adult) (pediatric): Secondary | ICD-10-CM

## 2019-11-04 HISTORY — DX: Idiopathic sleep related nonobstructive alveolar hypoventilation: G47.34

## 2019-11-04 NOTE — Patient Instructions (Signed)
Schedule home sleep test Based on this and the nature of drop in oxygen levels, we will decide whether this is related to sleep apnea or emphysema from your prior smoking

## 2019-11-04 NOTE — Progress Notes (Signed)
Subjective:    Patient ID: Gregory Wilkins, male    DOB: Feb 27, 1941, 79 y.o.   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  I have reviewed discharge summary from [facility] Follow-up blood work shows LFTs have improved He was a heavy smoker about 40 pack years before he quit in the 80s. He reports occasional sinus infections that will cause bronchitis but denies wheezing and frequent chest colds. Epworth sleepiness score is 4 and denies sleepiness during social situations or driving. Bedtime is between 11 PM to 1 AM, sleep latency is minimal, he prefers to sleep on his back or on his left side, denies nocturnal awakenings for nocturia.  He sleeps better in the reclining position, is out of bed at 7 AM feeling rested without dryness of mouth or headaches. He has gained 20 pounds in the last 5 years. He takes clonazepam about 3 days a week which seems to help with insomnia and restless legs.  He has never tried dopaminergic agents  Past Medical History:  Diagnosis Date  . Allergic rhinitis   . BPH (benign prostatic hyperplasia)   . 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 Diverticula of colon   . Hepatitis A 04/2019  . Hypertension   . Liver injury 04/2019  . Mallet finger 2012     4th L DIP,Dr Sypher  . Nephrolithiasis   . RLS (restless legs syndrome)     Past Surgical History:  Procedure Laterality Date  . basal cell cancer     X 3  . INGUINAL HERNIA REPAIR  45,53   right,left  .  OPEN REDUCTION INTERNAL FIXATION (ORIF) METACARPAL Right 05/19/2013   Procedure: OPEN REDUCTION INTERNAL FIXATION  RIGHT SMALL  METACARPAL FRACTURE;  Surgeon: Cammie Sickle., MD;  Location: [place];  Service: Orthopedics;  Laterality: Right;  . POLYPECTOMY     X 3; Dr Earlean Shawl  . WISDOM TOOTH EXTRACTION      Allergies  Allergen Reactions  . Penicillins     hives  . Ramipril Anaphylaxis and Swelling    Social History   Socioeconomic History  . Marital status: Married    Spouse name: Butch Penny   . Number of children: Not on file  . Years of education: Not on file  . Highest education level: Not on file  Occupational History  . Occupation: retired---Financial Analyst  Tobacco Use  . Smoking status: Former Smoker    Packs/day: 2.00    Years: 26.00    Pack years: 52.00    Types: Cigarettes    Quit date: 07/29/1982    Years since quitting: 37.2  . Smokeless tobacco: Never Used  . Tobacco comment: ages [age], up to 2 ppd  Substance and Sexual Activity  . Alcohol use: No    Alcohol/week: 0.0 standard drinks  . Drug use: No  . Sexual activity: Not on file  Other Topics Concern  . Not on file  Social History Narrative   Household: pt, wife, child  Social Determinants of Health   Financial Resource Strain:   . Difficulty of Paying Living Expenses:   Food Insecurity:   . Worried About Charity fundraiser in the Last Year:   . Arboriculturist in the Last Year:   Transportation Needs:   . Film/video editor (Medical):   Marland Kitchen Lack of Transportation (Non-Medical):   Physical Activity:   . Days of Exercise per Week:   . Minutes of Exercise per Session:   Stress:   . Feeling of Stress :   Social Connections:   . Frequency of Communication with Friends and Family:   . Frequency of Social Gatherings with Friends and Family:   . Attends Religious Services:   . Active Member of Clubs or Organizations:   . Attends Archivist Meetings:   Marland Kitchen Marital  Status:   Intimate Partner Violence:   . Fear of Current or Ex-Partner:   . Emotionally Abused:   Marland Kitchen Physically Abused:   . Sexually Abused:      Family History  Problem Relation Age of Onset  . Diabetes Father   . Stroke Mother 25  . Hypertension Mother   . Heart disease Neg Hx   . Cancer Neg Hx       Review of Systems neg for any significant sore throat, dysphagia, itching, sneezing, nasal congestion or excess/ purulent secretions, fever, chills, sweats, unintended wt loss, pleuritic or exertional cp, hempoptysis, orthopnea pnd or change in chronic leg swelling. Also denies presyncope, palpitations, heartburn, abdominal pain, nausea, vomiting, diarrhea or change in bowel or urinary habits, dysuria,hematuria, rash, arthralgias, visual complaints, headache, numbness weakness or ataxia.     Objective:   Physical Exam  Gen. Pleasant, obese, in no distress, normal affect ENT - no pallor,icterus, no post nasal drip, class 2 airway Neck: No JVD, no thyromegaly, no carotid bruits Lungs: no use of accessory muscles, no dullness to percussion, decreased without rales or rhonchi  Cardiovascular: Rhythm regular, heart sounds  normal, no murmurs or gallops, no peripheral edema Abdomen: soft and non-tender, no hepatosplenomegaly, BS normal. Musculoskeletal: No deformities, no cyanosis or clubbing Neuro:  alert, non focal, no tremors       Assessment & Plan:

## 2019-11-04 NOTE — Assessment & Plan Note (Signed)
Given nocturnal hypoxia noted during hospital stay & loud snoring, obstructive sleep apnea is possible & an overnight polysomnogram will be scheduled as a split study. The pathophysiology of obstructive sleep apnea , it's cardiovascular consequences & modes of treatment including CPAP were discused with the patient in detail & they evidenced understanding.  Differential diagnosis here would be hypoxia related to emphysema from extensive smoking in the past. He would be agreeable to use a CPAP machine if needed, will only use this if moderate OSA, AHI more than 15/hour

## 2019-11-09 ENCOUNTER — Ambulatory Visit (INDEPENDENT_AMBULATORY_CARE_PROVIDER_SITE_OTHER): Payer: Medicare Other | Admitting: Psychology

## 2019-11-09 DIAGNOSIS — F4322 Adjustment disorder with anxiety: Secondary | ICD-10-CM | POA: Diagnosis not present

## 2019-11-23 ENCOUNTER — Ambulatory Visit: Payer: Medicare Other | Admitting: Psychology

## 2019-12-07 ENCOUNTER — Ambulatory Visit (INDEPENDENT_AMBULATORY_CARE_PROVIDER_SITE_OTHER): Payer: Medicare Other | Admitting: Psychology

## 2019-12-07 DIAGNOSIS — F4322 Adjustment disorder with anxiety: Secondary | ICD-10-CM | POA: Diagnosis not present

## 2019-12-21 ENCOUNTER — Ambulatory Visit (INDEPENDENT_AMBULATORY_CARE_PROVIDER_SITE_OTHER): Payer: Medicare Other | Admitting: Psychology

## 2019-12-21 DIAGNOSIS — F4322 Adjustment disorder with anxiety: Secondary | ICD-10-CM | POA: Diagnosis not present

## 2019-12-22 ENCOUNTER — Ambulatory Visit: Payer: Medicare Other | Admitting: Internal Medicine

## 2019-12-23 ENCOUNTER — Encounter: Payer: Self-pay | Admitting: Internal Medicine

## 2019-12-23 ENCOUNTER — Ambulatory Visit (INDEPENDENT_AMBULATORY_CARE_PROVIDER_SITE_OTHER): Payer: Medicare Other | Admitting: Internal Medicine

## 2019-12-23 ENCOUNTER — Other Ambulatory Visit: Payer: Self-pay

## 2019-12-23 VITALS — BP 123/71 | HR 60 | Temp 96.6°F | Resp 18 | Ht 70.0 in | Wt 228.1 lb

## 2019-12-23 DIAGNOSIS — R399 Unspecified symptoms and signs involving the genitourinary system: Secondary | ICD-10-CM | POA: Diagnosis not present

## 2019-12-23 DIAGNOSIS — M545 Low back pain, unspecified: Secondary | ICD-10-CM

## 2019-12-23 DIAGNOSIS — G4734 Idiopathic sleep related nonobstructive alveolar hypoventilation: Secondary | ICD-10-CM

## 2019-12-23 DIAGNOSIS — B159 Hepatitis A without hepatic coma: Secondary | ICD-10-CM | POA: Diagnosis not present

## 2019-12-23 LAB — URINALYSIS, ROUTINE W REFLEX MICROSCOPIC
Bilirubin Urine: NEGATIVE
Hgb urine dipstick: NEGATIVE
Ketones, ur: 40 — AB
Leukocytes,Ua: NEGATIVE
Nitrite: NEGATIVE
Specific Gravity, Urine: 1.025 (ref 1.000–1.030)
Total Protein, Urine: NEGATIVE
Urine Glucose: NEGATIVE
Urobilinogen, UA: 0.2 (ref 0.0–1.0)
pH: 6 (ref 5.0–8.0)

## 2019-12-23 LAB — COMPREHENSIVE METABOLIC PANEL
ALT: 26 U/L (ref 0–53)
AST: 29 U/L (ref 0–37)
Albumin: 4.4 g/dL (ref 3.5–5.2)
Alkaline Phosphatase: 65 U/L (ref 39–117)
BUN: 17 mg/dL (ref 6–23)
CO2: 27 mEq/L (ref 19–32)
Calcium: 9.9 mg/dL (ref 8.4–10.5)
Chloride: 97 mEq/L (ref 96–112)
Creatinine, Ser: 1.03 mg/dL (ref 0.40–1.50)
GFR: 70.04 mL/min (ref >60.00)
Glucose, Bld: 84 mg/dL (ref 70–99)
Potassium: 4.1 mEq/L (ref 3.5–5.1)
Sodium: 133 mEq/L — ABNORMAL LOW (ref 135–145)
Total Bilirubin: 0.8 mg/dL (ref 0.2–1.2)
Total Protein: 7.5 g/dL (ref 6.0–8.3)

## 2019-12-23 LAB — POC URINALSYSI DIPSTICK (AUTOMATED)
Bilirubin, UA: NEGATIVE
Blood, UA: NEGATIVE
Glucose, UA: NEGATIVE
Leukocytes, UA: NEGATIVE
Nitrite, UA: NEGATIVE
Protein, UA: POSITIVE — AB
Spec Grav, UA: 1.025 (ref 1.010–1.025)
Urobilinogen, UA: 0.2 E.U./dL
pH, UA: 6 (ref 5.0–8.0)

## 2019-12-23 LAB — PSA: PSA: 1.66 ng/mL (ref 0.10–4.00)

## 2019-12-23 MED ORDER — PREDNISONE 10 MG PO TABS: ORAL_TABLET | ORAL | 0 refills | Status: DC

## 2019-12-23 NOTE — Progress Notes (Signed)
Pre visit review using our clinic review tool, if applicable. No additional management support is needed unless otherwise documented below in the visit note. 

## 2019-12-23 NOTE — Patient Instructions (Addendum)
Please schedule Medicare Wellness with Glenard Haring.   For hip pain: Prednisone for few days Tylenol Rest Call if not better  GO TO THE LAB : Get the blood work

## 2019-12-23 NOTE — Progress Notes (Signed)
Subjective:    Patient ID: Gregory Wilkins, male    DOB: 1940/08/16, 79 y.o.   MRN: [id number]  DOS:  12/23/2019 Type of visit - description: Acute, 2 problems  4 weeks history of urinary symptoms: Slow start, slow stream.  Has good days and bad days. Prior to the 4 weeks, had essentially no major problems.  Also 1 week history of right sided pain at the low back, buttock and hip. Denies any injury or fall No rash The pain is a steady but worse with walking. Denies any lower extremity paresthesias.    Review of Systems No dysuria or gross hematuria. Some urinary urgency and frequency No chest pain no difficulty breathing  Past Medical History:  Diagnosis Date  . Allergic rhinitis   . BPH (benign prostatic hyperplasia)   . 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 Diverticula of colon   . Hepatitis A 04/2019  . Hypertension   . Liver injury 04/2019  . Mallet finger 2012     4th L DIP,Dr Sypher  . Nephrolithiasis   . RLS (restless legs syndrome)     Past Surgical History:  Procedure Laterality Date  . basal cell cancer     X 3  . INGUINAL HERNIA REPAIR  45,53   right,left  . OPEN REDUCTION INTERNAL FIXATION (ORIF) METACARPAL Right 05/19/2013   Procedure: OPEN REDUCTION INTERNAL FIXATION  RIGHT SMALL  METACARPAL FRACTURE;  Surgeon: Cammie Sickle., MD;  Location: [place];  Service: Orthopedics;  Laterality: Right;  . POLYPECTOMY     X 3; Dr Earlean Shawl  . WISDOM TOOTH EXTRACTION      Allergies as of 12/23/2019      Reactions   Penicillins    hives   Ramipril Anaphylaxis, Swelling      Medication List       Accurate as of Dec 23, 2019  8:45 AM. If you have any questions, ask your nurse or doctor.        cetirizine 10 MG tablet Commonly known as: ZYRTEC Take 10 mg by mouth daily.   clonazePAM 0.5 MG tablet Commonly known as: KLONOPIN TAKE 1 TABLET BY MOUTH EVERY NIGHT AT BEDTIME AS NEEDED FOR RESTLESS LEG   ICAPS  AREDS 2 PO Take by mouth.   losartan-hydrochlorothiazide 50-12.5 MG tablet Commonly known as: HYZAAR Take 1 tablet by mouth daily.   multivitamin with minerals Tabs tablet Take 1 tablet by mouth daily.          Objective:   Physical Exam Musculoskeletal:       Legs:    BP 123/71 (BP Location: Left Arm, Patient Position: Sitting, Cuff Size: Normal)   Pulse 60   Temp (!) 96.6 F (35.9 C) (Temporal)   Resp 18   Ht 5\' 10"  (1.778 m)   Wt 228 lb 2 oz (103.5 kg)   SpO2 97%   BMI 32.73 kg/m  General:   Well developed, NAD, BMI noted.  HEENT:  Normocephalic . Face symmetric, atraumatic Lungs:  CTA B Normal respiratory effort, no intercostal retractions, no accessory muscle use. Heart: RRR,  no murmur.  Abdomen:  Not distended, soft, non-tender. No rebound or rigidity.   Skin: Not pale. Not jaundice Lower extremities: no pretibial edema bilaterally  MSK: Slightly TTP at the back, see graphic. Hip rotation normal DRE: Normal size prostate, nontender, nonnodular Neurologic:  alert & oriented X3.  Speech normal, gait appropriate for age and unassisted.  Motor normal Psych--  Cognition and judgment appear intact.  Cooperative with normal attention span and concentration.  Behavior appropriate. No anxious or depressed appearing.     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  Call if not better LUTS:  symptoms started 4 weeks ago, before that he was doing very good. Exam is benign. Udip: ketones/protein Plan: UA, urine culture, PSA.  (Mild prostatitis?)  If results are negative, consider Flomax. Recheck a urine on RTC to r/o albuminuria,  proteinuria.    H/o hepatitis A: Since the last visit, LFTs continue to be elevated, I advised to see the specialist, he was told that it was okay to follow-up with PCP.  Get a CMP today. Nocturnal hypoxia, seen by pulmonary 11/04/2019, suspected sleep apnea, sleep study pending.  DDx includes emphysema from history of smoking. RTC scheduled for July as scheduled   This visit occurred during the SARS-CoV-2 public health emergency.  Safety protocols were in place, including screening questions prior to the visit, additional usage of staff PPE, and extensive cleaning of exam room while observing appropriate contact time as indicated for disinfecting solutions.

## 2019-12-24 LAB — URINE CULTURE
MICRO NUMBER:: 10532151
Result:: NO GROWTH
SPECIMEN QUALITY:: ADEQUATE

## 2019-12-25 NOTE — Assessment & Plan Note (Signed)
Right low back/right hip pain: No red flag sxs, recommend anti-inflammatory therapy with a round of prednisone, Tylenol, rest.  Call if not better LUTS:  symptoms started 4 weeks ago, before that he was doing very good. Exam is benign. Udip: ketones/protein Plan: UA, urine culture, PSA.  (Mild prostatitis?)  If results are negative, consider Flomax. Recheck a urine on RTC to r/o albuminuria, proteinuria.    H/o hepatitis A: Since the last visit, LFTs continue to be elevated, I advised to see the specialist, he was told that it was okay to follow-up with PCP.  Get a CMP today. Nocturnal hypoxia, seen by pulmonary 11/04/2019, suspected sleep apnea, sleep study pending.  DDx includes emphysema from history of smoking. RTC scheduled for July as scheduled

## 2019-12-27 MED ORDER — TAMSULOSIN HCL 0.4 MG PO CAPS: 0.4000 mg | ORAL_CAPSULE | Freq: Every day | ORAL | 0 refills | Status: DC

## 2019-12-27 NOTE — Addendum Note (Signed)
Addended byDamita Dunnings D on: 12/27/2019 07:46 AM   Modules accepted: Orders

## 2020-01-04 ENCOUNTER — Ambulatory Visit (INDEPENDENT_AMBULATORY_CARE_PROVIDER_SITE_OTHER): Payer: Medicare Other | Admitting: Psychology

## 2020-01-04 DIAGNOSIS — F4322 Adjustment disorder with anxiety: Secondary | ICD-10-CM

## 2020-01-18 ENCOUNTER — Ambulatory Visit: Payer: Medicare Other | Admitting: Psychology

## 2020-01-18 ENCOUNTER — Other Ambulatory Visit: Payer: Self-pay | Admitting: Internal Medicine

## 2020-01-18 ENCOUNTER — Ambulatory Visit (INDEPENDENT_AMBULATORY_CARE_PROVIDER_SITE_OTHER): Payer: Medicare Other | Admitting: Psychology

## 2020-01-18 DIAGNOSIS — F4322 Adjustment disorder with anxiety: Secondary | ICD-10-CM

## 2020-01-25 ENCOUNTER — Ambulatory Visit (INDEPENDENT_AMBULATORY_CARE_PROVIDER_SITE_OTHER): Payer: Medicare Other | Admitting: Internal Medicine

## 2020-01-25 ENCOUNTER — Other Ambulatory Visit: Payer: Self-pay

## 2020-01-25 ENCOUNTER — Encounter: Payer: Self-pay | Admitting: Internal Medicine

## 2020-01-25 VITALS — BP 128/82 | HR 55 | Temp 97.4°F | Resp 18 | Ht 70.0 in | Wt 230.0 lb

## 2020-01-25 DIAGNOSIS — G2581 Restless legs syndrome: Secondary | ICD-10-CM | POA: Diagnosis not present

## 2020-01-25 DIAGNOSIS — R809 Proteinuria, unspecified: Secondary | ICD-10-CM | POA: Diagnosis not present

## 2020-01-25 DIAGNOSIS — Z0000 Encounter for general adult medical examination without abnormal findings: Secondary | ICD-10-CM

## 2020-01-25 DIAGNOSIS — E782 Mixed hyperlipidemia: Secondary | ICD-10-CM | POA: Diagnosis not present

## 2020-01-25 DIAGNOSIS — I10 Essential (primary) hypertension: Secondary | ICD-10-CM | POA: Diagnosis not present

## 2020-01-25 HISTORY — DX: Encounter for general adult medical examination without abnormal findings: Z00.00

## 2020-01-25 LAB — LIPID PANEL
Cholesterol: 197 mg/dL (ref 0–200)
HDL: 59.7 mg/dL (ref >39.00)
LDL Cholesterol: 123 mg/dL — ABNORMAL HIGH (ref 0–99)
NonHDL: 137.42
Total CHOL/HDL Ratio: 3
Triglycerides: 72 mg/dL (ref 0.0–149.0)
VLDL: 14.4 mg/dL (ref 0.0–40.0)

## 2020-01-25 LAB — MICROALBUMIN / CREATININE URINE RATIO
Creatinine,U: 72.9 mg/dL
Microalb Creat Ratio: 1 mg/g (ref 0.0–30.0)
Microalb, Ur: <0.7 mg/dL (ref 0.0–1.9)

## 2020-01-25 MED ORDER — CLONAZEPAM 0.5 MG PO TABS: ORAL_TABLET | ORAL | 0 refills | Status: DC

## 2020-01-25 MED ORDER — LOSARTAN POTASSIUM-HCTZ 50-12.5 MG PO TABS: 1.0000 | ORAL_TABLET | Freq: Every day | ORAL | 1 refills | Status: DC

## 2020-01-25 NOTE — Patient Instructions (Addendum)
Happy belated Rudene Anda!   Check the  blood pressure 2 or 3 times a month   BP GOAL is between 110/65 and  135/85. If it is consistently higher or lower, let me know    GO TO THE LAB : Get the blood work     [place], [place] back for for a checkup in 6 to 8 months

## 2020-01-25 NOTE — Assessment & Plan Note (Signed)
Preventive care reviewed: Immunizations: UTD, rec flu shot q year Prostate ca screening: last PSA wnl CCS: C-scope 2014: Polyps 2015: No polyps 2020: Tubular adenoma.  Next per GI if patient interested.

## 2020-01-25 NOTE — Progress Notes (Signed)
Subjective:    Patient ID: Gregory Wilkins, male    DOB: 08-06-1940, 79 y.o.   MRN: [id number]  DOS:  01/25/2020 Type of visit - description: Follow-up Since the last office visit he is doing well. LUTS symptoms completely resolved. Regards his arthritic pains, he saw orthopedic surgery.   Review of Systems See above   Past Medical History:  Diagnosis Date   Allergic rhinitis    BPH (benign prostatic hyperplasia)    Colonic polyp 11/10/12   transitional cell adenoma   Diverticula of colon    Hepatitis A 04/2019   Hypertension    Liver injury 04/2019   Mallet finger 2012     4th L DIP,Dr Sypher   Nephrolithiasis    RLS (restless legs syndrome)     Past Surgical History:  Procedure Laterality Date   basal cell cancer     X 3   INGUINAL HERNIA REPAIR  45,51   right,left   OPEN REDUCTION INTERNAL FIXATION (ORIF) METACARPAL Right 05/19/2013   Procedure: OPEN REDUCTION INTERNAL FIXATION  RIGHT SMALL  METACARPAL FRACTURE;  Surgeon: Cammie Sickle., MD;  Location: [place];  Service: Orthopedics;  Laterality: Right;   POLYPECTOMY     X 3; Dr Earlean Shawl   WISDOM TOOTH EXTRACTION      Allergies as of 01/25/2020      Reactions   Penicillins    hives   Ramipril Anaphylaxis, Swelling      Medication List       Accurate as of January 25, 2020  8:24 AM. If you have any questions, ask your nurse or doctor.        cetirizine 10 MG tablet Commonly known as: ZYRTEC Take 10 mg by mouth daily.   clonazePAM 0.5 MG tablet Commonly known as: KLONOPIN TAKE 1 TABLET BY MOUTH EVERY NIGHT AT BEDTIME AS NEEDED FOR RESTLESS LEG   ICAPS AREDS 2 PO Take by mouth.   losartan-hydrochlorothiazide 50-12.5 MG tablet Commonly known as: HYZAAR Take 1 tablet by mouth daily.   multivitamin with minerals Tabs tablet Take 1 tablet by mouth daily.   predniSONE 10 MG tablet Commonly known as: DELTASONE 3 tabs x 3 days, 2 tabs x 3 days, 1 tab x 3 days    tamsulosin 0.4 MG Caps capsule Commonly known as: FLOMAX Take 1 capsule (0.4 mg total) by mouth daily after supper.          Objective:   Physical Exam BP 128/82 (BP Location: Left Arm, Patient Position: Sitting, Cuff Size: Normal)    Pulse (!) 55    Temp (!) 97.4 F (36.3 C) (Temporal)    Resp 18    Ht 5\' 10"  (1.778 m)    Wt 230 lb (104.3 kg)    SpO2 94%    BMI 33.00 kg/m  General:   Well developed, NAD, BMI noted. HEENT:  Normocephalic . Face symmetric, atraumatic Lungs:  CTA B Normal respiratory effort, no intercostal retractions, no accessory muscle use. Heart: RRR,  no murmur.  Lower extremities: no pretibial edema bilaterally  Skin: Not pale. Not jaundice Neurologic:  alert & oriented X3.  Speech normal, gait appropriate for age and unassisted Psych--  Cognition and judgment appear intact.  Cooperative with normal attention span and concentration.  Behavior appropriate. No anxious or depressed appearing.      Assessment    Assessment (new patient 05/17/2019) HTN High Cholesterol Restless leg (clonazepam prn) Physiologic bradycardia H/o kidney stone Chronic  LE edema L>R, mild  Hepatitis a, acute 10-20 20  PLAN HTN: RF Hyzaar, check BPs at home, last BMP okay. High cholesterol: Diet controlled, check FLP LUTS: See last visit, last PSA normal.  Negative urine culture.  On Flomax, asymptomatic.  No change H/o hep A: Last LFTs normal Proteinuria?  See last visit, check a micro.  RLS: RF clonazepam Nocturnal hypoxia: Sleep apnea study pending Right low back, right hip pain: Saw Ortho, x-rays done, starting physical therapy, they are considering local injection RTC 6 to 8 months   This visit occurred during the SARS-CoV-2 public health emergency.  Safety protocols were in place, including screening questions prior to the visit, additional usage of staff PPE, and extensive cleaning of exam room while observing appropriate contact time as indicated for  disinfecting solutions.

## 2020-01-25 NOTE — Assessment & Plan Note (Signed)
HTN: RF Hyzaar, check BPs at home, last BMP okay. High cholesterol: Diet controlled, check FLP LUTS: See last visit, last PSA normal.  Negative urine culture.  On Flomax, asymptomatic.  No change H/o hep A: Last LFTs normal Proteinuria?  See last visit, check a micro.  RLS: RF clonazepam Nocturnal hypoxia: Sleep apnea study pending Right low back, right hip pain: Saw Ortho, x-rays done, starting physical therapy, they are considering local injection RTC 6 to 8 months

## 2020-01-25 NOTE — Progress Notes (Signed)
Pre visit review using our clinic review tool, if applicable. No additional management support is needed unless otherwise documented below in the visit note. 

## 2020-01-26 ENCOUNTER — Ambulatory Visit: Payer: Medicare Other | Admitting: Internal Medicine

## 2020-01-26 ENCOUNTER — Encounter: Payer: Self-pay | Admitting: Internal Medicine

## 2020-01-27 MED ORDER — ATORVASTATIN CALCIUM 10 MG PO TABS
10.0000 mg | ORAL_TABLET | Freq: Every day | ORAL | 3 refills | Status: DC
Start: 2020-01-27 — End: 2020-03-14

## 2020-01-27 NOTE — Addendum Note (Signed)
Addended byDamita Dunnings D on: 01/27/2020 07:39 AM   Modules accepted: Orders

## 2020-02-01 ENCOUNTER — Ambulatory Visit (INDEPENDENT_AMBULATORY_CARE_PROVIDER_SITE_OTHER): Payer: Medicare Other | Admitting: Psychology

## 2020-02-01 DIAGNOSIS — F4322 Adjustment disorder with anxiety: Secondary | ICD-10-CM

## 2020-02-15 ENCOUNTER — Ambulatory Visit: Payer: Medicare Other | Admitting: Psychology

## 2020-02-16 ENCOUNTER — Ambulatory Visit (INDEPENDENT_AMBULATORY_CARE_PROVIDER_SITE_OTHER): Payer: Medicare Other | Admitting: Psychology

## 2020-02-16 DIAGNOSIS — F4322 Adjustment disorder with anxiety: Secondary | ICD-10-CM

## 2020-02-29 ENCOUNTER — Ambulatory Visit (INDEPENDENT_AMBULATORY_CARE_PROVIDER_SITE_OTHER): Payer: Medicare Other | Admitting: Psychology

## 2020-02-29 DIAGNOSIS — F4322 Adjustment disorder with anxiety: Secondary | ICD-10-CM

## 2020-03-04 ENCOUNTER — Emergency Department (HOSPITAL_BASED_OUTPATIENT_CLINIC_OR_DEPARTMENT_OTHER)
Admission: EM | Admit: 2020-03-04 | Discharge: 2020-03-05 | Disposition: A | Discharge status: Home / Self Care | Payer: Medicare Other | Attending: Emergency Medicine | Admitting: Emergency Medicine

## 2020-03-04 ENCOUNTER — Emergency Department (HOSPITAL_BASED_OUTPATIENT_CLINIC_OR_DEPARTMENT_OTHER): Payer: Medicare Other

## 2020-03-04 ENCOUNTER — Other Ambulatory Visit: Payer: Self-pay

## 2020-03-04 ENCOUNTER — Encounter (HOSPITAL_BASED_OUTPATIENT_CLINIC_OR_DEPARTMENT_OTHER): Payer: Self-pay | Admitting: *Deleted

## 2020-03-04 DIAGNOSIS — Z79899 Other long term (current) drug therapy: Secondary | ICD-10-CM | POA: Diagnosis not present

## 2020-03-04 DIAGNOSIS — I10 Essential (primary) hypertension: Secondary | ICD-10-CM | POA: Diagnosis not present

## 2020-03-04 DIAGNOSIS — Z87891 Personal history of nicotine dependence: Secondary | ICD-10-CM | POA: Insufficient documentation

## 2020-03-04 DIAGNOSIS — Z85828 Personal history of other malignant neoplasm of skin: Secondary | ICD-10-CM | POA: Insufficient documentation

## 2020-03-04 DIAGNOSIS — R002 Palpitations: Secondary | ICD-10-CM | POA: Diagnosis present

## 2020-03-04 DIAGNOSIS — I4891 Unspecified atrial fibrillation: Secondary | ICD-10-CM

## 2020-03-04 LAB — CBC WITH DIFFERENTIAL/PLATELET
Abs Immature Granulocytes: 0.02 10*3/uL (ref 0.00–0.07)
Basophils Absolute: 0.1 10*3/uL (ref 0.0–0.1)
Basophils Relative: 1 %
Eosinophils Absolute: 0.4 10*3/uL (ref 0.0–0.5)
Eosinophils Relative: 5 %
HCT: 42.5 % (ref 39.0–52.0)
Hemoglobin: 14 g/dL (ref 13.0–17.0)
Immature Granulocytes: 0 %
Lymphocytes Relative: 30 %
Lymphs Abs: 2.3 10*3/uL (ref 0.7–4.0)
MCH: 30.5 pg (ref 26.0–34.0)
MCHC: 32.9 g/dL (ref 30.0–36.0)
MCV: 92.6 fL (ref 80.0–100.0)
Monocytes Absolute: 1 10*3/uL (ref 0.1–1.0)
Monocytes Relative: 13 %
Neutro Abs: 3.8 10*3/uL (ref 1.7–7.7)
Neutrophils Relative %: 51 %
Platelets: 205 10*3/uL (ref 150–400)
RBC: 4.59 MIL/uL (ref 4.22–5.81)
RDW: 13 % (ref 11.5–15.5)
WBC: 7.5 10*3/uL (ref 4.0–10.5)
nRBC: 0 % (ref 0.0–0.2)

## 2020-03-04 LAB — BASIC METABOLIC PANEL
Anion gap: 10 (ref 5–15)
BUN: 18 mg/dL (ref 8–23)
CO2: 24 mmol/L (ref 22–32)
Calcium: 9 mg/dL (ref 8.9–10.3)
Chloride: 104 mmol/L (ref 98–111)
Creatinine, Ser: 1.05 mg/dL (ref 0.61–1.24)
GFR calc Af Amer: >60 mL/min (ref >60)
GFR calc non Af Amer: >60 mL/min (ref >60)
Glucose, Bld: 95 mg/dL (ref 70–99)
Potassium: 3.3 mmol/L — ABNORMAL LOW (ref 3.5–5.1)
Sodium: 138 mmol/L (ref 135–145)

## 2020-03-04 LAB — MAGNESIUM: Magnesium: 2.1 mg/dL (ref 1.7–2.4)

## 2020-03-04 MED ORDER — METOPROLOL TARTRATE 5 MG/5ML IV SOLN: 5.0000 mg | Freq: Once | INTRAVENOUS | Status: DC

## 2020-03-04 MED ORDER — METOPROLOL TARTRATE 50 MG PO TABS
50.0000 mg | ORAL_TABLET | Freq: Once | ORAL | Status: AC
  Administered 2020-03-04: 50 mg via ORAL

## 2020-03-04 MED ORDER — APIXABAN 2.5 MG PO TABS
5.0000 mg | ORAL_TABLET | Freq: Two times a day (BID) | ORAL | Status: DC
  Administered 2020-03-05: 5 mg via ORAL
  Filled 2020-03-04: qty 2

## 2020-03-04 MED ORDER — METOPROLOL TARTRATE 5 MG/5ML IV SOLN
5.0000 mg | INTRAVENOUS | Status: DC | PRN
  Administered 2020-03-04: 5 mg via INTRAVENOUS
  Filled 2020-03-04: qty 5

## 2020-03-04 MED ORDER — APIXABAN 5 MG PO TABS
5.0000 mg | ORAL_TABLET | Freq: Two times a day (BID) | ORAL | 0 refills | Status: DC
Start: 2020-03-04 — End: 2020-04-16

## 2020-03-04 MED ORDER — METOPROLOL TARTRATE 50 MG PO TABS
25.0000 mg | ORAL_TABLET | Freq: Once | ORAL | Status: DC
  Filled 2020-03-04: qty 1

## 2020-03-04 NOTE — ED Provider Notes (Addendum)
[facility] EMERGENCY DEPARTMENT Provider Note   CSN: [id number] Arrival date & time: 03/04/20  2101     History Chief Complaint  Patient presents with  . Irregular Heart Beat    Gregory Wilkins is a 79 y.o. male.  Presents to ER with concern for elevated heart rate.  Patient reports that he noted on his smart watch which showed an elevated heart rate to the 140s.  He did not have any symptoms at the time and has no ongoing symptoms at this time.  Specifically denies any chest pain, difficulty breathing, lightheadedness, dizziness, syncope, near syncope.  He denies any recent illnesses, no recent procedures, no recent medication changes.  He denies prior history atrial fibrillation, atrial flutter.  He reports that he regularly wears his smart watch and rarely removes it.  On review of smart watch data, he has never had this hiatal heart rate.  This afternoon patient had gone on a 2.9 mile walk and his max heart rate was 116, which patient thinks is normal for him.  Not established with cardiology, follows with Dr. Larose Kells for primary care.  Reports history of hyperlipidemia.  No recent major surgeries, no history of GI bleed, no history of stroke, brain bleed.  Denies any falls in the last year.  Retired Optometrist. Nonsmoker.   HPI     Past Medical History:  Diagnosis Date  . Allergic rhinitis   . BPH (benign prostatic hyperplasia)   . 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 Diverticula of colon   . Hepatitis A 04/2019  . Hypertension   . Liver injury 04/2019  . Mallet finger 2012     4th L DIP,Dr Sypher  . Nephrolithiasis   . RLS (restless legs syndrome)     Patient Active Problem List   Diagnosis Date Noted  . Annual physical exam 01/25/2020  . Nocturnal hypoxia 11/04/2019  . PCP NOTES >>>>>>>>>>>>>>>> 05/26/2019  . Liver injury, initial encounter 05/17/2019  . Anxiety 01/18/2018  . Macular degeneration 01/06/2018  . Snoring 02/01/2014  .  Obesity (BMI 30-39.9) 01/04/2014  . Blood donor 01/03/2014  . 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 RLS (restless legs syndrome) 06/30/2012  . Family history of type C viral hepatitis 06/30/2012  . DIVERTICULOSIS, COLON 11/09/2008  . BENIGN PROSTATIC HYPERTROPHY 11/09/2008  . HYPERLIPIDEMIA 09/21/2007  . Essential hypertension 09/21/2007  . NEPHROLITHIASIS, HX OF 09/21/2007  . ALLERGIC RHINITIS 12/23/2006  . ACNE ROSACEA 12/23/2006  . COLONIC POLYPS, HX OF 12/23/2006    Past Surgical History:  Procedure Laterality Date  . basal cell cancer     X 3  . INGUINAL HERNIA REPAIR  [age]   right,left  . OPEN REDUCTION INTERNAL FIXATION (ORIF) METACARPAL Right 05/19/2013   Procedure: OPEN REDUCTION INTERNAL FIXATION  RIGHT SMALL  METACARPAL FRACTURE;  Surgeon: Cammie Sickle., MD;  Location: [place] [facility];  Service: Orthopedics;  Laterality: Right;  . POLYPECTOMY     X 3; Dr Earlean Shawl  . WISDOM TOOTH EXTRACTION         Family History  Problem Relation Age of Onset  . Diabetes Father   . Stroke Mother 13  . Hypertension Mother   . Heart disease Neg Hx   . Cancer Neg Hx     Social History   Tobacco Use  . Smoking status: Former Smoker    Packs/day: 2.00    Years: 26.00    Pack years: 52.00  Types: Cigarettes    Quit date: 07/29/1982    Years since quitting: 37.6  . Smokeless tobacco: Never Used  . Tobacco comment: ages [age], up to 2 ppd  Vaping Use  . Vaping Use: Never used  Substance Use Topics  . Alcohol use: No    Alcohol/week: 0.0 standard drinks  . Drug use: No    Home Medications Prior to Admission medications   Medication Sig Start Date End Date Taking? Authorizing Provider  apixaban (ELIQUIS) 5 MG TABS tablet Take 1 tablet (5 mg total) by mouth 2 (two) times daily. 03/04/20 04/03/20  Lucrezia Starch, MD  atorvastatin (LIPITOR) 10 MG tablet Take 1 tablet (10 mg total) by mouth at bedtime. 01/27/20   Colon Branch, MD  cetirizine (ZYRTEC) 10  MG tablet Take 10 mg by mouth daily.    [provider]  clonazePAM (KLONOPIN) 0.5 MG tablet TAKE 1 TABLET BY MOUTH EVERY NIGHT AT BEDTIME AS NEEDED FOR RESTLESS LEG 01/25/20   Colon Branch, MD  losartan-hydrochlorothiazide (HYZAAR) 50-12.5 MG tablet Take 1 tablet by mouth daily. 01/25/20   Colon Branch, MD  Multiple Vitamin (MULTIVITAMIN WITH MINERALS) TABS tablet Take 1 tablet by mouth daily.    [provider]  Multiple Vitamins-Minerals (ICAPS AREDS 2 PO) Take by mouth.    [provider]  tamsulosin (FLOMAX) 0.4 MG CAPS capsule Take 1 capsule (0.4 mg total) by mouth daily after supper. 01/18/20   Colon Branch, MD    Allergies    Penicillins and Ramipril  Review of Systems   Review of Systems  Constitutional: Negative for chills and fever.  HENT: Negative for ear pain and sore throat.   Eyes: Negative for pain and visual disturbance.  Respiratory: Negative for cough and shortness of breath.   Cardiovascular: Positive for palpitations. Negative for chest pain.  Gastrointestinal: Negative for abdominal pain and vomiting.  Genitourinary: Negative for dysuria and hematuria.  Musculoskeletal: Negative for arthralgias and back pain.  Skin: Negative for color change and rash.  Neurological: Negative for seizures and syncope.  All other systems reviewed and are negative.   Physical Exam Updated Vital Signs BP 96/79 (BP Location: Right Arm)   Pulse 99   Temp 98.1 F (36.7 C) (Oral)   Resp 18   Ht 5\' 10"  (1.778 m)   Wt 100.7 kg   SpO2 95%   BMI 31.85 kg/m   Physical Exam Vitals and nursing note reviewed.  Constitutional:      Appearance: He is well-developed.  HENT:     Head: Normocephalic and atraumatic.  Eyes:     Conjunctiva/sclera: Conjunctivae normal.  Cardiovascular:     Rate and Rhythm: Normal rate and regular rhythm.     Heart sounds: No murmur heard.   Pulmonary:     Effort: Pulmonary effort is normal. No respiratory distress.     Breath  sounds: Normal breath sounds.  Abdominal:     Palpations: Abdomen is soft.     Tenderness: There is no abdominal tenderness.  Musculoskeletal:        General: No deformity or signs of injury.     Cervical back: Neck supple.  Skin:    General: Skin is warm and dry.     Capillary Refill: Capillary refill takes less than 2 seconds.  Neurological:     General: No focal deficit present.     Mental Status: He is alert.  Psychiatric:        Mood  and Affect: Mood normal.        Behavior: Behavior normal.     ED Results / Procedures / Treatments   Labs (all labs ordered are listed, but only abnormal results are displayed) Labs Reviewed  BASIC METABOLIC PANEL - Abnormal; Notable for the following components:      Result Value   Potassium 3.3 (*)    All other components within normal limits  CBC WITH DIFFERENTIAL/PLATELET  MAGNESIUM    EKG EKG Interpretation  Date/Time:  "Sunday March 04 2020 21:14:37 EDT Ventricular Rate:  135 PR Interval:    QRS Duration: 110 QT Interval:  303 QTC Calculation: 455 R Axis:   51 Text Interpretation: Atrial flutter with predominant 2:1 AV block Repolarization abnormality, prob rate related Confirmed by Jonise Weightman ([id number]) on 03/04/2020 9:23:36 PM   Radiology DG Chest Portable 1 View  Result Date: 03/04/2020 CLINICAL DATA:  Tachycardia, chest pain EXAM: PORTABLE CHEST 1 VIEW COMPARISON:  05/16/2019 FINDINGS: Heart and mediastinal contours are within normal limits. No focal opacities or effusions. No acute bony abnormality. IMPRESSION: No active disease. Electronically Signed   By: Kevin  Dover M.D.   On: 03/04/2020 22:04    Procedures .Critical Care Performed by: Areona Homer S, MD Authorized by: Pricsilla Lindvall S, MD   Critical care provider statement:    Critical care time (minutes):  35   Critical care was necessary to treat or prevent imminent or life-threatening deterioration of the following conditions:  Cardiac failure    Critical care was time spent personally by me on the following activities:  Discussions with consultants, evaluation of patient's response to treatment, examination of patient, ordering and performing treatments and interventions, ordering and review of laboratory studies, ordering and review of radiographic studies, pulse oximetry, re-evaluation of patient's condition, obtaining history from patient or surrogate and review of old charts   (including critical care time)  Medications Ordered in ED Medications  metoprolol tartrate (LOPRESSOR) tablet 50 mg (50 mg Oral Given 03/04/20 2227)    ED Course  I have reviewed the triage vital signs and the nursing notes.  Pertinent labs & imaging results that were available during my care of the patient were reviewed by me and considered in my medical decision making (see chart for details).    MDM Rules/Calculators/A&P                          79"  year old male presents to ER with concern for elevated heart rate.  Initial EKG concerning for atrial flutter with rapid ventricular rate, subsequent EKG likely atrial fibrillation.  Patient does not have prior history, currently asymptomatic and was only incidentally picked up on his smart watch.  Per review of smart watch to ER, this has never happened before. Labs grossly normal, no chest pain or cardiac symptoms, no signs of acute ischemia on EKG. Reviewed case with on-call cardiology fellow.  He felt if patient reliable, confident in new onset, could offer cardioversion or attempt rate control and rec start on [facility], f/u afib clinic if dc'd.  Give patient trial dose of IV metop and dose of oral metop, had some improvement in rate. While patient being further observed in ER, he spontaneously converted to a NSR. As patient spontaneously converted, had no symptoms associated with this episode, will defer starting on home rate or rhythm controlling agents to afib clinic. Chads2vasc score is 2 due to age. No  contraindications to [facility]. Reviewed risks/benefits of AC.  Will start on eliquis for now. After further obs in ER, remained in NSR and asymptomatic. Placed amb ref afib clinic. Rec f/u in afib clinic and f/u with PCP. Review return precautions for recurrent tachycardia.    After the discussed management above, the patient was determined to be safe for discharge.  The patient was in agreement with this plan and all questions regarding their care were answered.  ED return precautions were discussed and the patient will return to the ED with any significant worsening of condition.    Final Clinical Impression(s) / ED Diagnoses Final diagnoses:  Atrial fibrillation, unspecified type [facility])    Rx / DC Orders ED Discharge Orders         Ordered    Amb Referral to AFIB Clinic     Discontinue  Reprint     03/04/20 2354    apixaban (ELIQUIS) 5 MG TABS tablet  2 times daily     Discontinue  Reprint     03/04/20 2356           Lucrezia Starch, MD 03/05/20 1205    Lucrezia Starch, MD 03/22/20 1134

## 2020-03-04 NOTE — Discharge Instructions (Addendum)
Recommend taking the blood thinner as prescribed for stroke prevention.  Please call the cardiology clinic tomorrow morning to get follow-up appointment.  If you have palpitations, lightheadedness, chest pain or difficulty in breathing, your heart rate is running greater than 110 at rest, please return to ER for reassessment at that time.

## 2020-03-04 NOTE — ED Triage Notes (Signed)
Pt reports he noticed his HR was elevated this evening according to his fitbit. Denies pain. States he felt fine. HR has been in the 140s

## 2020-03-04 NOTE — ED Notes (Signed)
ED Provider at bedside. 

## 2020-03-05 DIAGNOSIS — I4891 Unspecified atrial fibrillation: Secondary | ICD-10-CM | POA: Diagnosis not present

## 2020-03-11 ENCOUNTER — Telehealth: Payer: Self-pay | Admitting: Internal Medicine

## 2020-03-11 NOTE — Telephone Encounter (Signed)
Please check on the patient, was recently seen at the ED with atrial fibrillation.  If he already has a follow-up with the A. fib clinic or cardiology no need to follow-up here.  Otherwise arrange a visit for this week

## 2020-03-12 ENCOUNTER — Other Ambulatory Visit (INDEPENDENT_AMBULATORY_CARE_PROVIDER_SITE_OTHER): Payer: Medicare Other

## 2020-03-12 ENCOUNTER — Other Ambulatory Visit: Payer: Self-pay

## 2020-03-12 DIAGNOSIS — I10 Essential (primary) hypertension: Secondary | ICD-10-CM

## 2020-03-12 DIAGNOSIS — E782 Mixed hyperlipidemia: Secondary | ICD-10-CM | POA: Diagnosis not present

## 2020-03-12 LAB — BASIC METABOLIC PANEL
BUN: 14 mg/dL (ref 6–23)
CO2: 28 mEq/L (ref 19–32)
Calcium: 9.4 mg/dL (ref 8.4–10.5)
Chloride: 105 mEq/L (ref 96–112)
Creatinine, Ser: 1.03 mg/dL (ref 0.40–1.50)
GFR: 70 mL/min (ref >60.00)
Glucose, Bld: 91 mg/dL (ref 70–99)
Potassium: 3.9 mEq/L (ref 3.5–5.1)
Sodium: 140 mEq/L (ref 135–145)

## 2020-03-12 LAB — LIPID PANEL
Cholesterol: 143 mg/dL (ref 0–200)
HDL: 64.3 mg/dL (ref >39.00)
LDL Cholesterol: 69 mg/dL (ref 0–99)
NonHDL: 78.57
Total CHOL/HDL Ratio: 2
Triglycerides: 48 mg/dL (ref 0.0–149.0)
VLDL: 9.6 mg/dL (ref 0.0–40.0)

## 2020-03-12 LAB — ALT: ALT: 16 U/L (ref 0–53)

## 2020-03-12 LAB — AST: AST: 18 U/L (ref 0–37)

## 2020-03-12 NOTE — Telephone Encounter (Signed)
thx

## 2020-03-12 NOTE — Telephone Encounter (Signed)
Pt has appt on 03/23/20 w/ cardiology.

## 2020-03-14 ENCOUNTER — Ambulatory Visit (INDEPENDENT_AMBULATORY_CARE_PROVIDER_SITE_OTHER): Payer: Medicare Other | Admitting: Psychology

## 2020-03-14 DIAGNOSIS — F4322 Adjustment disorder with anxiety: Secondary | ICD-10-CM

## 2020-03-14 MED ORDER — ATORVASTATIN CALCIUM 10 MG PO TABS: 10.0000 mg | ORAL_TABLET | Freq: Every day | ORAL | 3 refills | Status: DC

## 2020-03-14 NOTE — Addendum Note (Signed)
Addended byDamita Dunnings D on: 03/14/2020 01:04 PM   Modules accepted: Orders

## 2020-03-19 NOTE — Addendum Note (Signed)
Addended by: Tery Sanfilippo R on: 03/19/2020 02:54 PM   Modules accepted: Orders

## 2020-03-23 ENCOUNTER — Other Ambulatory Visit: Payer: Self-pay

## 2020-03-23 ENCOUNTER — Ambulatory Visit (INDEPENDENT_AMBULATORY_CARE_PROVIDER_SITE_OTHER): Payer: Medicare Other | Admitting: Cardiology

## 2020-03-23 ENCOUNTER — Encounter: Payer: Self-pay | Admitting: Cardiology

## 2020-03-23 VITALS — BP 128/68 | HR 57 | Ht 70.0 in | Wt 229.0 lb

## 2020-03-23 DIAGNOSIS — I4892 Unspecified atrial flutter: Secondary | ICD-10-CM | POA: Insufficient documentation

## 2020-03-23 DIAGNOSIS — E663 Overweight: Secondary | ICD-10-CM

## 2020-03-23 DIAGNOSIS — I10 Essential (primary) hypertension: Secondary | ICD-10-CM | POA: Diagnosis not present

## 2020-03-23 DIAGNOSIS — E782 Mixed hyperlipidemia: Secondary | ICD-10-CM

## 2020-03-23 HISTORY — DX: Overweight: E66.3

## 2020-03-23 HISTORY — DX: Mixed hyperlipidemia: E78.2

## 2020-03-23 MED ORDER — METOPROLOL TARTRATE 50 MG PO TABS: 50.0000 mg | ORAL_TABLET | ORAL | 0 refills | Status: DC | PRN

## 2020-03-23 MED ORDER — METOPROLOL TARTRATE 50 MG PO TABS: ORAL_TABLET | ORAL | 0 refills | Status: DC

## 2020-03-23 NOTE — Patient Instructions (Addendum)
Medication Instructions:  Your physician has recommended you make the following change in your medication:   Take Metoprolol tartrate 50 mg as needed for a fast heart rate.  *If you need a refill on your cardiac medications before your next appointment, please call your pharmacy*   Lab Work: None ordered If you have labs (blood work) drawn today and your tests are completely normal, you will receive your results only by: Marland Kitchen MyChart Message (if you have MyChart) OR . A paper copy in the mail If you have any lab test that is abnormal or we need to change your treatment, we will call you to review the results.   Testing/Procedures: Your physician has requested that you have an echocardiogram. Echocardiography is a painless test that uses sound waves to create images of your heart. It provides your doctor with information about the size and shape of your heart and how well your heart's chambers and valves are working. This procedure takes approximately one hour. There are no restrictions for this procedure.     Follow-Up: At [facility], you and your health needs are our priority.  As part of our continuing mission to provide you with exceptional heart care, we have created designated Provider Care Teams.  These Care Teams include your primary Cardiologist (physician) and Advanced Practice Providers (APPs -  Physician Assistants and Nurse Practitioners) who all work together to provide you with the care you need, when you need it.  We recommend signing up for the patient portal called "MyChart".  Sign up information is provided on this After Visit Summary.  MyChart is used to connect with patients for Virtual Visits (Telemedicine).  Patients are able to view lab/test results, encounter notes, upcoming appointments, etc.  Non-urgent messages can be sent to your provider as well.   To learn more about what you can do with MyChart, go to [email or web address].    Your next appointment:   6  month(s)  The format for your next appointment:   In Person  Provider:   Jyl Heinz, MD   Other Instructions  Echocardiogram An echocardiogram is a procedure that uses painless sound waves (ultrasound) to produce an image of the heart. Images from an echocardiogram can provide important information about:  Signs of coronary artery disease (CAD).  Aneurysm detection. An aneurysm is a weak or damaged part of an artery wall that bulges out from the normal force of blood pumping through the body.  Heart size and shape. Changes in the size or shape of the heart can be associated with certain conditions, including heart failure, aneurysm, and CAD.  Heart muscle function.  Heart valve function.  Signs of a past heart attack.  Fluid buildup around the heart.  Thickening of the heart muscle.  A tumor or infectious growth around the heart valves. Tell a health care provider about:  Any allergies you have.  All medicines you are taking, including vitamins, herbs, eye drops, creams, and over-the-counter medicines.  Any blood disorders you have.  Any surgeries you have had.  Any medical conditions you have.  Whether you are pregnant or may be pregnant. What are the risks? Generally, this is a safe procedure. However, problems may occur, including:  Allergic reaction to dye (contrast) that may be used during the procedure. What happens before the procedure? No specific preparation is needed. You may eat and drink normally. What happens during the procedure?   An IV tube may be inserted into one of your veins.  You may receive contrast through this tube. A contrast is an injection that improves the quality of the pictures from your heart.  A gel will be applied to your chest.  A wand-like tool (transducer) will be moved over your chest. The gel will help to transmit the sound waves from the transducer.  The sound waves will harmlessly bounce off of your heart to  allow the heart images to be captured in real-time motion. The images will be recorded on a computer. The procedure may vary among health care providers and hospitals. What happens after the procedure?  You may return to your normal, everyday life, including diet, activities, and medicines, unless your health care provider tells you not to do that. Summary  An echocardiogram is a procedure that uses painless sound waves (ultrasound) to produce an image of the heart.  Images from an echocardiogram can provide important information about the size and shape of your heart, heart muscle function, heart valve function, and fluid buildup around your heart.  You do not need to do anything to prepare before this procedure. You may eat and drink normally.  After the echocardiogram is completed, you may return to your normal, everyday life, unless your health care provider tells you not to do that. This information is not intended to replace advice given to you by your health care provider. Make sure you discuss any questions you have with your health care provider. Document Revised: 11/04/2018 Document Reviewed: 08/16/2016 Elsevier Patient Education  2020 [employer]   

## 2020-03-23 NOTE — Progress Notes (Signed)
Cardiology Office Note:    Date:  03/23/2020   ID:  Gregory Wilkins, DOB 08/03/1940, MRN [id number]  PCP:  Colon Branch, MD  Cardiologist:  Jenean Lindau, MD   Referring MD: Lucrezia Starch, MD    ASSESSMENT:    1. Essential (primary) hypertension   2. Atrial flutter, paroxysmal ([place])   3. Mixed dyslipidemia   4. Overweight    PLAN:    In order of problems listed above:  1. Primary prevention stressed with the patient.  Importance of compliance with diet medication stressed any vocalized understanding.  Importance of regular exercise stressed and a congratulated him on his excellent exercise program 2. Mixed dyslipidemia: Diet was emphasized and he is following a good diet.  Weight reduction was stressed 3. Paroxysmal atrial flutter:I discussed with the patient atrial flutter, disease process. Management and therapy including rate and rhythm control, anticoagulation benefits and potential risks were discussed extensively with the patient. Patient had multiple questions which were answered to patient's satisfaction. 4. Overweight: Diet was emphasized.  Weight reduction stressed.  Risks of obesity explained and he vocalized understanding. 5. Patient will be seen in follow-up appointment in 6 months or earlier if the patient has any concerns    Medication Adjustments/Labs and Tests Ordered: Current medicines are reviewed at length with the patient today.  Concerns regarding medicines are outlined above.  No orders of the defined types were placed in this encounter.  No orders of the defined types were placed in this encounter.    History of Present Illness:    Gregory Wilkins is a 79 y.o. male who is being seen today for the evaluation of atrial flutter paroxysmal at the request of Lucrezia Starch, MD.  Patient is a pleasant 79 year old healthy gentleman.  He has past medical history of essential hypertension and dyslipidemia.  He mentions to me that he had  palpitations and went to the emergency room and was found to be in atrial flutter subsequently converted into normal rhythm.  I reviewed the hospital records extensively.  He denies any chest pain orthopnea or PND.  He walks about 2 miles a day on a regular basis.  At the time of my evaluation, the patient is alert awake oriented and in no distress.  Past Medical History:  Diagnosis Date  . Allergic rhinitis   . BPH (benign prostatic hyperplasia)   . 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 Diverticula of colon   . Hepatitis A 04/2019  . Hypertension   . Liver injury 04/2019  . Mallet finger 2012     4th L DIP,Dr Sypher  . Nephrolithiasis   . RLS (restless legs syndrome)     Past Surgical History:  Procedure Laterality Date  . basal cell cancer     X 3  . INGUINAL HERNIA REPAIR  45,53   right,left  . OPEN REDUCTION INTERNAL FIXATION (ORIF) METACARPAL Right 05/19/2013   Procedure: OPEN REDUCTION INTERNAL FIXATION  RIGHT SMALL  METACARPAL FRACTURE;  Surgeon: Cammie Sickle., MD;  Location: [place];  Service: Orthopedics;  Laterality: Right;  . POLYPECTOMY     X 3; Dr Earlean Shawl  . WISDOM TOOTH EXTRACTION      Current Medications: Current Meds  Medication Sig  . apixaban (ELIQUIS) 5 MG TABS tablet Take 1 tablet (5 mg total) by mouth 2 (two) times daily.  Marland Kitchen atorvastatin (LIPITOR) 10 MG tablet Take 1 tablet (10 mg  total) by mouth at bedtime.  . cetirizine (ZYRTEC) 10 MG tablet Take 10 mg by mouth daily.  . clonazePAM (KLONOPIN) 0.5 MG tablet TAKE 1 TABLET BY MOUTH EVERY NIGHT AT BEDTIME AS NEEDED FOR RESTLESS LEG  . losartan-hydrochlorothiazide (HYZAAR) 50-12.5 MG tablet Take 1 tablet by mouth daily.  . Multiple Vitamin (MULTIVITAMIN WITH MINERALS) TABS tablet Take 1 tablet by mouth daily.  . Multiple Vitamins-Minerals (ICAPS AREDS 2 PO) Take by mouth.     Allergies:   Penicillins and Ramipril   Social History   Socioeconomic History  . Marital  status: Married    Spouse name: Butch Penny   . Number of children: Not on file  . Years of education: Not on file  . Highest education level: Not on file  Occupational History  . Occupation: retired---Financial Analyst  Tobacco Use  . Smoking status: Former Smoker    Packs/day: 2.00    Years: 26.00    Pack years: 52.00    Types: Cigarettes    Quit date: 07/29/1982    Years since quitting: 37.6  . Smokeless tobacco: Never Used  . Tobacco comment: ages [age], up to 2 ppd  Vaping Use  . Vaping Use: Never used  Substance and Sexual Activity  . Alcohol use: No    Alcohol/week: 0.0 standard drinks  . Drug use: No  . Sexual activity: Not on file  Other Topics Concern  . Not on file  Social History Narrative   Household: pt, wife, child   Social Determinants of Health   Financial Resource Strain:   . Difficulty of Paying Living Expenses: Not on file  Food Insecurity:   . Worried About Charity fundraiser in the Last Year: Not on file  . Ran Out of Food in the Last Year: Not on file  Transportation Needs:   . Lack of Transportation (Medical): Not on file  . Lack of Transportation (Non-Medical): Not on file  Physical Activity:   . Days of Exercise per Week: Not on file  . Minutes of Exercise per Session: Not on file  Stress:   . Feeling of Stress : Not on file  Social Connections:   . Frequency of Communication with Friends and Family: Not on file  . Frequency of Social Gatherings with Friends and Family: Not on file  . Attends Religious Services: Not on file  . Active Member of Clubs or Organizations: Not on file  . Attends Archivist Meetings: Not on file  . Marital Status: Not on file     Family History: The patient's family history includes Diabetes in his father; Hypertension in his mother; Stroke (age of onset: 76) in his mother. There is no history of Heart disease or Cancer.  ROS:   Please see the history of present illness.    All other systems reviewed and  are negative.  EKGs/Labs/Other Studies Reviewed:    The following studies were reviewed today: I reviewed records from emergency room EKG reveals atrial flutter with elevated ventricular rate.  Today's EKG reveals sinus rhythm and nonspecific ST-T changes.  TSH in the past have been normal.   Recent Labs: 06/21/2019: TSH 1.84 03/04/2020: Hemoglobin 14.0; Magnesium 2.1; Platelets 205 03/12/2020: ALT 16; BUN 14; Creatinine, Ser 1.03; Potassium 3.9; Sodium 140  Recent Lipid Panel    Component Value Date/Time   CHOL 143 03/12/2020 0744   TRIG 48.0 03/12/2020 0744   HDL 64.30 03/12/2020 0744   CHOLHDL 2 03/12/2020 0744   VLDL  9.6 03/12/2020 0744   LDLCALC 69 03/12/2020 0744   LDLDIRECT 124.6 11/10/2008 0000    Physical Exam:    VS:  BP 128/68   Pulse (!) 57   Ht 5\' 10"  (1.778 m)   Wt 229 lb (103.9 kg)   SpO2 97%   BMI 32.86 kg/m     Wt Readings from Last 3 Encounters:  03/23/20 229 lb (103.9 kg)  03/04/20 222 lb (100.7 kg)  01/25/20 230 lb (104.3 kg)     GEN: Patient is in no acute distress HEENT: Normal NECK: No JVD; No carotid bruits LYMPHATICS: No lymphadenopathy CARDIAC: S1 S2 regular, 2/6 systolic murmur at the apex. RESPIRATORY:  Clear to auscultation without rales, wheezing or rhonchi  ABDOMEN: Soft, non-tender, non-distended MUSCULOSKELETAL:  No edema; No deformity  SKIN: Warm and dry NEUROLOGIC:  Alert and oriented x 3 PSYCHIATRIC:  Normal affect    Signed, Jenean Lindau, MD  03/23/2020 4:02 PM    [facility]

## 2020-03-28 ENCOUNTER — Ambulatory Visit (INDEPENDENT_AMBULATORY_CARE_PROVIDER_SITE_OTHER): Payer: Medicare Other | Admitting: Psychology

## 2020-03-28 DIAGNOSIS — F4322 Adjustment disorder with anxiety: Secondary | ICD-10-CM

## 2020-03-30 ENCOUNTER — Ambulatory Visit (HOSPITAL_COMMUNITY)
Admission: RE | Admit: 2020-03-30 | Discharge: 2020-03-30 | Disposition: A | Discharge status: Home / Self Care | Payer: Medicare Other | Source: Ambulatory Visit | Attending: Cardiology | Admitting: Cardiology

## 2020-03-30 ENCOUNTER — Telehealth: Payer: Self-pay | Admitting: Emergency Medicine

## 2020-03-30 ENCOUNTER — Other Ambulatory Visit: Payer: Self-pay

## 2020-03-30 DIAGNOSIS — I358 Other nonrheumatic aortic valve disorders: Secondary | ICD-10-CM | POA: Insufficient documentation

## 2020-03-30 DIAGNOSIS — I420 Dilated cardiomyopathy: Secondary | ICD-10-CM | POA: Diagnosis not present

## 2020-03-30 DIAGNOSIS — R931 Abnormal findings on diagnostic imaging of heart and coronary circulation: Secondary | ICD-10-CM

## 2020-03-30 DIAGNOSIS — R9431 Abnormal electrocardiogram [ECG] [EKG]: Secondary | ICD-10-CM

## 2020-03-30 DIAGNOSIS — I7789 Other specified disorders of arteries and arterioles: Secondary | ICD-10-CM

## 2020-03-30 DIAGNOSIS — I4892 Unspecified atrial flutter: Secondary | ICD-10-CM | POA: Insufficient documentation

## 2020-03-30 LAB — ECHOCARDIOGRAM COMPLETE
Area-P 1/2: 2.83 cm2
S' Lateral: 3.2 cm

## 2020-03-30 NOTE — Progress Notes (Signed)
  Echocardiogram 2D Echocardiogram has been performed.  Gregory Wilkins 03/30/2020, 11:14 AM

## 2020-03-30 NOTE — Addendum Note (Signed)
Addended by: Ashok Norris on: 03/30/2020 05:20 PM   Modules accepted: Orders

## 2020-03-30 NOTE — Telephone Encounter (Signed)
-----   Message from Jenean Lindau, MD sent at 03/30/2020  1:21 PM EDT ----- Echo is normal but aortic root is mildly dilated.  I need to him to get a CT scan of the chest without contrast to evaluate it.  Copy primary care Jenean Lindau, MD 03/30/2020 1:21 PM

## 2020-04-03 ENCOUNTER — Ambulatory Visit: Payer: Medicare Other

## 2020-04-03 ENCOUNTER — Other Ambulatory Visit: Payer: Self-pay

## 2020-04-03 DIAGNOSIS — G4733 Obstructive sleep apnea (adult) (pediatric): Secondary | ICD-10-CM | POA: Diagnosis not present

## 2020-04-03 DIAGNOSIS — G4734 Idiopathic sleep related nonobstructive alveolar hypoventilation: Secondary | ICD-10-CM

## 2020-04-03 NOTE — Telephone Encounter (Signed)
Called patient. Informed him per Dr. Geraldo Pitter that he needs to have a Ct. He verbally understood. Informed him that he was already scheduled for tomorrow at 130pm at [facility] point. He reports he will not be able to make that appointment. Scheduling number for him to reschedule as his schedule allows was sent to him on mychart as requested. No further questions.

## 2020-04-04 ENCOUNTER — Ambulatory Visit (HOSPITAL_BASED_OUTPATIENT_CLINIC_OR_DEPARTMENT_OTHER)
Admission: RE | Admit: 2020-04-04 | Discharge: 2020-04-04 | Disposition: A | Discharge status: Home / Self Care | Payer: Medicare Other | Source: Ambulatory Visit | Attending: Cardiology | Admitting: Cardiology

## 2020-04-04 ENCOUNTER — Other Ambulatory Visit (HOSPITAL_BASED_OUTPATIENT_CLINIC_OR_DEPARTMENT_OTHER): Payer: Medicare Other

## 2020-04-04 DIAGNOSIS — R931 Abnormal findings on diagnostic imaging of heart and coronary circulation: Secondary | ICD-10-CM | POA: Diagnosis present

## 2020-04-04 DIAGNOSIS — I7789 Other specified disorders of arteries and arterioles: Secondary | ICD-10-CM | POA: Insufficient documentation

## 2020-04-06 ENCOUNTER — Telehealth: Payer: Self-pay | Admitting: Cardiology

## 2020-04-06 ENCOUNTER — Telehealth: Payer: Self-pay

## 2020-04-06 NOTE — Telephone Encounter (Signed)
Thank you, noted.

## 2020-04-06 NOTE — Telephone Encounter (Signed)
Patient returning call for CT results. 

## 2020-04-06 NOTE — Telephone Encounter (Signed)
Called patient back informed him of results.

## 2020-04-06 NOTE — Telephone Encounter (Signed)
Received call from [facility] at [facility] regarding Pt's recent CT scan completed on 04/04/2020. Abnormalities seen on liver wanted to make [place] aware- Pt already aware.   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

## 2020-04-11 ENCOUNTER — Ambulatory Visit (INDEPENDENT_AMBULATORY_CARE_PROVIDER_SITE_OTHER): Payer: Medicare Other | Admitting: Psychology

## 2020-04-11 DIAGNOSIS — F4322 Adjustment disorder with anxiety: Secondary | ICD-10-CM | POA: Diagnosis not present

## 2020-04-13 ENCOUNTER — Other Ambulatory Visit: Payer: Self-pay

## 2020-04-13 ENCOUNTER — Telehealth: Payer: Self-pay | Admitting: Pulmonary Disease

## 2020-04-13 DIAGNOSIS — G4733 Obstructive sleep apnea (adult) (pediatric): Secondary | ICD-10-CM | POA: Diagnosis not present

## 2020-04-13 NOTE — Telephone Encounter (Signed)
HST showed severe  OSA with AHI 57/ hr Please let patient know and send prescription to DME for autoCPAP  5-15 cm, mask of choice  Also since OSA was so severe, also proceed with CPAP titration study to see if he needs oxygen OV with me/APP in 6 wks

## 2020-04-13 NOTE — Telephone Encounter (Signed)
Called and spoke with patient about HST result per Dr Elsworth Soho. All questions answered, let patient know that I will put in order per Dr Elsworth Soho for CPAP and its settings. Also put in order for CPAP Titration study to be done. Discussed this with patient and patient agreeable to it and expressed understanding. Scheduled 6 week follow up appointment per Dr Elsworth Soho with Wyn Quaker NP for 05/25/2020 at 9am to follow up on CPAP. Patient agreeable to this date, time and location. Nothing further needed at this time.

## 2020-04-16 ENCOUNTER — Other Ambulatory Visit: Payer: Self-pay

## 2020-04-16 MED ORDER — APIXABAN 5 MG PO TABS: 5.0000 mg | ORAL_TABLET | Freq: Two times a day (BID) | ORAL | 1 refills | Status: DC

## 2020-04-25 ENCOUNTER — Ambulatory Visit (INDEPENDENT_AMBULATORY_CARE_PROVIDER_SITE_OTHER): Payer: Medicare Other | Admitting: Psychology

## 2020-04-25 DIAGNOSIS — F4322 Adjustment disorder with anxiety: Secondary | ICD-10-CM | POA: Diagnosis not present

## 2020-05-01 ENCOUNTER — Telehealth: Payer: Self-pay | Admitting: Pulmonary Disease

## 2020-05-01 NOTE — Telephone Encounter (Signed)
Noted  

## 2020-05-09 ENCOUNTER — Ambulatory Visit (INDEPENDENT_AMBULATORY_CARE_PROVIDER_SITE_OTHER): Payer: Medicare Other | Admitting: Psychology

## 2020-05-09 DIAGNOSIS — F4322 Adjustment disorder with anxiety: Secondary | ICD-10-CM

## 2020-05-17 ENCOUNTER — Other Ambulatory Visit: Payer: Self-pay

## 2020-05-17 ENCOUNTER — Ambulatory Visit (HOSPITAL_BASED_OUTPATIENT_CLINIC_OR_DEPARTMENT_OTHER): Payer: Medicare Other | Attending: Pulmonary Disease | Admitting: Internal Medicine

## 2020-05-17 DIAGNOSIS — G4733 Obstructive sleep apnea (adult) (pediatric): Secondary | ICD-10-CM

## 2020-05-17 HISTORY — DX: Obstructive sleep apnea (adult) (pediatric): G47.33

## 2020-05-21 ENCOUNTER — Telehealth: Payer: Self-pay | Admitting: Pulmonary Disease

## 2020-05-21 DIAGNOSIS — G4733 Obstructive sleep apnea (adult) (pediatric): Secondary | ICD-10-CM | POA: Diagnosis not present

## 2020-05-21 NOTE — Telephone Encounter (Signed)
Called patient and left message on voicemail to please return phone call and to confirm upcoming scheduled appointment for Friday 05/25/2020. Contact number provided.

## 2020-05-21 NOTE — Procedures (Signed)
Patient Name: Gregory Wilkins, Gregory Wilkins Date: 05/17/2020 Gender: Male D.O.B: [birth year] Age (years): 73 Referring Provider: Kara Mead MD, ABSM Height (inches): 70 Interpreting Physician: Kara Mead MD, ABSM Weight (lbs): 230 RPSGT: Jorge Ny BMI: 33 MRN: [id number] Neck Size: 15.00 <br> <br> CLINICAL INFORMATION The patient is referred for a CPAP titration to treat sleep apnea.    Date of  HST: 03/2020 showed severe  OSA with AHI 57/ hr  SLEEP STUDY TECHNIQUE As per the AASM Manual for the Scoring of Sleep and Associated Events v2.3 (April 2016) with a hypopnea requiring 4% desaturations.  The channels recorded and monitored were frontal, central and occipital EEG, electrooculogram (EOG), submentalis EMG (chin), nasal and oral airflow, thoracic and abdominal wall motion, anterior tibialis EMG, snore microphone, electrocardiogram, and pulse oximetry. Continuous positive airway pressure (CPAP) was initiated at the beginning of the study and titrated to treat sleep-disordered breathing.  MEDICATIONS Medications self-administered by patient taken the night of the study : ATORVASTATIN, CETIRIZINE, CLONAZEPAM, losartan-hydrochlorothiazide, multiple vitamin- minerals icaps areds  TECHNICIAN COMMENTS Comments added by technician: THE PATIENT HAD A DIFFICULT TIME MAINTAINING SLEEP. Comments added by scorer: N/A RESPIRATORY PARAMETERS Optimal PAP Pressure (cm): 9 cm AHI at Optimal Pressure (/hr): 13.2 Overall Minimal O2 (%): 87.0 Supine % at Optimal Pressure (%): N/A Minimal O2 at Optimal Pressure (%): 87.0   SLEEP ARCHITECTURE The study was initiated at 10:38:14 PM and ended at 4:54:08 AM.  Sleep onset time was 3.7 minutes and the sleep efficiency was 78.9%%. The total sleep time was 296.5 minutes.  The patient spent 11.5%% of the night in stage N1 sleep, 83.3%% in stage N2 sleep, 0.0%% in stage N3 and 5.2% in REM.Stage REM latency was 213.0 minutes  Wake after sleep onset was  75.7. Alpha intrusion was absent. Supine sleep was 77.57%.  CARDIAC DATA The 2 lead EKG demonstrated sinus rhythm. The mean heart rate was 100.0 beats per minute. Other EKG findings include: PVCs. LEG MOVEMENT DATA The total Periodic Limb Movements of Sleep (PLMS) were 0. The PLMS index was 0.0. A PLMS index of <15 is considered normal in adults. Several limb movements with & without arousals were noted  IMPRESSIONS - An optimal PAP pressure could not be selected for this patient based on the available study data. - Mild Central Sleep Apnea was noted during this titration (CAI = 8.9/h). Centrals increased as CPAP pressure was increased fom 5 to 9 cm - Mild oxygen desaturations were observed during this titration (min O2 = 87.0%). - The patient snored with soft snoring volume during this titration study. - 2-lead EKG demonstrated: PVCs - Clinically significant periodic limb movements were not noted during this study. Arousals associated with PLMs were significant.   DIAGNOSIS - Obstructive Sleep Apnea (G47.33) - Treatment emergent central apneas   RECOMMENDATIONS - Recommend a trial of Auto-CPAP 5-12 cm H2O. Central apneas emerged during this titration study - Avoid alcohol, sedatives and other CNS depressants that may worsen sleep apnea and disrupt normal sleep architecture. - Sleep hygiene should be reviewed to assess factors that may improve sleep quality. - Weight management and regular exercise should be initiated or continued. - Return to Sleep Center for re-evaluation after 4 weeks of therapy  Kara Mead MD Board Certified in [place]

## 2020-05-21 NOTE — Telephone Encounter (Signed)
Titration study showed CPAP requirement of 9 cm. No oxygen was required. He has already been set up on auto CPAP I believe. Please arrange for follow-up visit to review data, next available with me

## 2020-05-22 ENCOUNTER — Telehealth: Payer: Self-pay

## 2020-05-22 NOTE — Telephone Encounter (Signed)
Called patient and rescheduled appointment due to patient stating he has not yet been set up on CPAP and stated the DME told him it will be another couple of weeks before he get's it. Patient originally had a follow up appointment with NP this Friday 05/25/2020 to review how the cpap is going. Dr Elsworth Soho made aware that patient has yet to get the cpap and ok with patient being rescheduled. Patient also agreeable to this and follow up appointment set up for Monday 07/23/2020 at 9am with B. Mack NP in hopes that patient will be established with a cpap. Patient agreeable to appointment time, date and location. Nothing further needed at this time. Patient advised to please call us if he has any issues.

## 2020-05-22 NOTE — Telephone Encounter (Signed)
-----   Message from Rigoberto Noel, MD sent at 05/22/2020 12:39 PM EDT ----- Regarding: RE: appointment Ok for 4 weks after he gets CPAP ----- Message ----- From: Merrilee Seashore, RN Sent: 05/22/2020  12:33 PM EDT To: Rigoberto Noel, MD Subject: appointment                                    I spoke with patient over the phone about titration study results. Patient stated he still does not have the CPAP yet and it is coming in a few weeks. He has a scheduled appointment with Wyn Quaker on Friday that was supposed to be a 6 week follow up post cpap set up. I will cancel this appointment and set up a future appointment with either you or NP's in 6-8 weeks if this is ok with you or do you want me to let the patient know to call us when he finally gets the cpap set up to schedule a follow up 6 weeks after?

## 2020-05-23 ENCOUNTER — Ambulatory Visit (INDEPENDENT_AMBULATORY_CARE_PROVIDER_SITE_OTHER): Payer: Medicare Other | Admitting: Psychology

## 2020-05-23 DIAGNOSIS — F4322 Adjustment disorder with anxiety: Secondary | ICD-10-CM | POA: Diagnosis not present

## 2020-05-25 ENCOUNTER — Ambulatory Visit: Payer: Medicare Other | Admitting: Pulmonary Disease

## 2020-06-06 ENCOUNTER — Ambulatory Visit (INDEPENDENT_AMBULATORY_CARE_PROVIDER_SITE_OTHER): Payer: Medicare Other | Admitting: Psychology

## 2020-06-06 DIAGNOSIS — F4322 Adjustment disorder with anxiety: Secondary | ICD-10-CM

## 2020-06-11 ENCOUNTER — Telehealth: Payer: Self-pay | Admitting: Internal Medicine

## 2020-06-11 DIAGNOSIS — G2581 Restless legs syndrome: Secondary | ICD-10-CM

## 2020-06-12 NOTE — Telephone Encounter (Signed)
PDMP okay, Rx sent 

## 2020-06-12 NOTE — Telephone Encounter (Signed)
Requesting: clonazepam 0.5mg   Contract: 12/18/2012 UDS: 05/16/2019  Last Visit: 01/25/2020 Next Visit: 08/29/2020 Last Refill: 01/25/2020 #90 and 0RF Pt sig: 1 tab qhs prn   Please Advise

## 2020-06-20 ENCOUNTER — Ambulatory Visit (INDEPENDENT_AMBULATORY_CARE_PROVIDER_SITE_OTHER): Payer: Medicare Other | Admitting: Psychology

## 2020-06-20 DIAGNOSIS — F4322 Adjustment disorder with anxiety: Secondary | ICD-10-CM

## 2020-06-28 DIAGNOSIS — M79672 Pain in left foot: Secondary | ICD-10-CM | POA: Insufficient documentation

## 2020-06-28 HISTORY — DX: Pain in left foot: M79.672

## 2020-07-04 ENCOUNTER — Ambulatory Visit (INDEPENDENT_AMBULATORY_CARE_PROVIDER_SITE_OTHER): Payer: Medicare Other | Admitting: Psychology

## 2020-07-04 DIAGNOSIS — F4322 Adjustment disorder with anxiety: Secondary | ICD-10-CM | POA: Diagnosis not present

## 2020-07-06 DIAGNOSIS — I4892 Unspecified atrial flutter: Secondary | ICD-10-CM

## 2020-07-09 MED ORDER — METOPROLOL TARTRATE 50 MG PO TABS: 50.0000 mg | ORAL_TABLET | ORAL | 1 refills | Status: DC | PRN

## 2020-07-18 ENCOUNTER — Ambulatory Visit (INDEPENDENT_AMBULATORY_CARE_PROVIDER_SITE_OTHER): Payer: Medicare Other | Admitting: Psychology

## 2020-07-18 DIAGNOSIS — F4322 Adjustment disorder with anxiety: Secondary | ICD-10-CM

## 2020-07-23 ENCOUNTER — Ambulatory Visit: Payer: Medicare Other | Admitting: Pulmonary Disease

## 2020-08-01 ENCOUNTER — Ambulatory Visit (INDEPENDENT_AMBULATORY_CARE_PROVIDER_SITE_OTHER): Payer: Medicare Other | Admitting: Psychology

## 2020-08-01 DIAGNOSIS — F4322 Adjustment disorder with anxiety: Secondary | ICD-10-CM | POA: Diagnosis not present

## 2020-08-07 ENCOUNTER — Other Ambulatory Visit: Payer: Self-pay | Admitting: Internal Medicine

## 2020-08-07 ENCOUNTER — Telehealth: Payer: Self-pay | Admitting: Internal Medicine

## 2020-08-07 MED ORDER — LOSARTAN POTASSIUM 50 MG PO TABS: 50.0000 mg | ORAL_TABLET | Freq: Every day | ORAL | 1 refills | Status: DC

## 2020-08-07 MED ORDER — HYDROCHLOROTHIAZIDE 12.5 MG PO TABS: 12.5000 mg | ORAL_TABLET | Freq: Every day | ORAL | 1 refills | Status: DC

## 2020-08-07 NOTE — Telephone Encounter (Signed)
Losartan-hctz 50/12.5mg  combo on back order at [facility]. Separate Rx's sent. Just an FYI.

## 2020-08-08 DIAGNOSIS — G4733 Obstructive sleep apnea (adult) (pediatric): Secondary | ICD-10-CM

## 2020-08-08 NOTE — Telephone Encounter (Signed)
Patient sent email  Last October you prescribed a CPAP machine and sent the information to [facility] who contacted me. I signed a rental agreement with them but they have yet to provide the machine. When I contacted them they responded it was on back order. My question concerns other vendors and other devices. I understand from my wife that there was a recall and I also assume that COVID is impacting demand for breathing assistance devices, but I wonder if there is an alternative device or source of supply that might address the need. I would appreciate any help or advice. Thank you for any guidance. Gregory Wilkins   I did let him know there was a back order ranging from 8-12 weeks. But I would see if Dr. Elsworth Soho knew of an alternative in the meantime.   Dr. Elsworth Soho please advise.

## 2020-08-08 NOTE — Telephone Encounter (Signed)
Will route to PCCs to see since patient does not use [facility] their DME is [facility]  [facility]s please help advise

## 2020-08-08 NOTE — Telephone Encounter (Signed)
[facility] has an alternative device made by different manufacturer called Luna .  Please check if that is a possibility for this patient

## 2020-08-09 NOTE — Telephone Encounter (Signed)
According the [facility] our [facility], the [facility] machine is 4-5 weeks to deliver and resmed is 4-5 months currently.   Libby please advise what further needs to be done for the machine a new order?

## 2020-08-15 ENCOUNTER — Ambulatory Visit (INDEPENDENT_AMBULATORY_CARE_PROVIDER_SITE_OTHER): Payer: Medicare Other | Admitting: Psychology

## 2020-08-15 DIAGNOSIS — F4322 Adjustment disorder with anxiety: Secondary | ICD-10-CM

## 2020-08-28 ENCOUNTER — Other Ambulatory Visit: Payer: Self-pay

## 2020-08-28 ENCOUNTER — Encounter: Payer: Self-pay | Admitting: Internal Medicine

## 2020-08-28 ENCOUNTER — Ambulatory Visit (INDEPENDENT_AMBULATORY_CARE_PROVIDER_SITE_OTHER): Payer: Medicare Other | Admitting: Internal Medicine

## 2020-08-28 VITALS — BP 118/64 | HR 61 | Temp 98.4°F | Resp 16 | Ht 70.0 in | Wt 237.2 lb

## 2020-08-28 DIAGNOSIS — Z23 Encounter for immunization: Secondary | ICD-10-CM | POA: Diagnosis not present

## 2020-08-28 DIAGNOSIS — I10 Essential (primary) hypertension: Secondary | ICD-10-CM

## 2020-08-28 DIAGNOSIS — F419 Anxiety disorder, unspecified: Secondary | ICD-10-CM | POA: Diagnosis not present

## 2020-08-28 DIAGNOSIS — G2581 Restless legs syndrome: Secondary | ICD-10-CM | POA: Diagnosis not present

## 2020-08-28 DIAGNOSIS — Z79899 Other long term (current) drug therapy: Secondary | ICD-10-CM

## 2020-08-28 DIAGNOSIS — G4733 Obstructive sleep apnea (adult) (pediatric): Secondary | ICD-10-CM

## 2020-08-28 LAB — BASIC METABOLIC PANEL
BUN: 11 mg/dL (ref 6–23)
CO2: 31 mEq/L (ref 19–32)
Calcium: 9.3 mg/dL (ref 8.4–10.5)
Chloride: 103 mEq/L (ref 96–112)
Creatinine, Ser: 0.93 mg/dL (ref 0.40–1.50)
GFR: 79.15 mL/min (ref >60.00)
Glucose, Bld: 91 mg/dL (ref 70–99)
Potassium: 3.4 mEq/L — ABNORMAL LOW (ref 3.5–5.1)
Sodium: 140 mEq/L (ref 135–145)

## 2020-08-28 LAB — CBC WITH DIFFERENTIAL/PLATELET
Basophils Absolute: 0 10*3/uL (ref 0.0–0.1)
Basophils Relative: 0.6 % (ref 0.0–3.0)
Eosinophils Absolute: 0.3 10*3/uL (ref 0.0–0.7)
Eosinophils Relative: 5.3 % — ABNORMAL HIGH (ref 0.0–5.0)
HCT: 43.8 % (ref 39.0–52.0)
Hemoglobin: 14.5 g/dL (ref 13.0–17.0)
Lymphocytes Relative: 21.8 % (ref 12.0–46.0)
Lymphs Abs: 1.4 10*3/uL (ref 0.7–4.0)
MCHC: 33.2 g/dL (ref 30.0–36.0)
MCV: 90.3 fl (ref 78.0–100.0)
Monocytes Absolute: 0.8 10*3/uL (ref 0.1–1.0)
Monocytes Relative: 12.8 % — ABNORMAL HIGH (ref 3.0–12.0)
Neutro Abs: 3.9 10*3/uL (ref 1.4–7.7)
Neutrophils Relative %: 59.5 % (ref 43.0–77.0)
Platelets: 204 10*3/uL (ref 150.0–400.0)
RBC: 4.85 Mil/uL (ref 4.22–5.81)
RDW: 13.6 % (ref 11.5–15.5)
WBC: 6.5 10*3/uL (ref 4.0–10.5)

## 2020-08-28 NOTE — Progress Notes (Signed)
Pre visit review using our clinic review tool, if applicable. No additional management support is needed unless otherwise documented below in the visit note. 

## 2020-08-28 NOTE — Patient Instructions (Signed)
Check the  blood pressure regularly BP GOAL is between 110/65 and  135/85. If it is consistently higher or lower, let me know   For weight management: Consider visit the wellness clinic Weight watchers? OPTIVA?   GO TO THE LAB : Get the blood work     GO TO THE FRONT DESK, [place] Come back for for a check up in 5-6  Months

## 2020-08-28 NOTE — Progress Notes (Signed)
Subjective:    Patient ID: Gregory Wilkins, male    DOB: 1941/01/25, 80 y.o.   MRN: [id number]  DOS:  08/28/2020 Type of visit - description: Follow-up Since the last office visit is doing well. Has no new symptoms. Good medication compliance without apparent side effects   Review of Systems Denies chest pain or difficulty breathing. No edema or palpitations History of LUTS: Much improved.  Very rarely hassome difficulty urinating..  Past Medical History:  Diagnosis Date  . Allergic rhinitis   . BPH (benign prostatic hyperplasia)   . 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 Diverticula of colon   . Hepatitis A 04/2019  . Hypertension   . Liver injury 04/2019  . Mallet finger 2012     4th L DIP,Dr Sypher  . Nephrolithiasis   . RLS (restless legs syndrome)     Past Surgical History:  Procedure Laterality Date  . basal cell cancer     X 3  . INGUINAL HERNIA REPAIR  45,51   right,left  . OPEN REDUCTION INTERNAL FIXATION (ORIF) METACARPAL Right 05/19/2013   Procedure: OPEN REDUCTION INTERNAL FIXATION  RIGHT SMALL  METACARPAL FRACTURE;  Surgeon: Cammie Sickle., MD;  Location: [place];  Service: Orthopedics;  Laterality: Right;  . POLYPECTOMY     X 3; Dr Earlean Shawl  . WISDOM TOOTH EXTRACTION      Allergies as of 08/28/2020      Reactions   Penicillins Hives   hives   Ramipril Anaphylaxis, Swelling      Medication List       Accurate as of August 28, 2020  6:28 PM. If you have any questions, ask your nurse or doctor.        apixaban 5 MG Tabs tablet Commonly known as: ELIQUIS Take 1 tablet (5 mg total) by mouth 2 (two) times daily.   atorvastatin 10 MG tablet Commonly known as: LIPITOR Take 1 tablet (10 mg total) by mouth at bedtime.   cetirizine 10 MG tablet Commonly known as: ZYRTEC Take 10 mg by mouth daily.   clonazePAM 0.5 MG tablet Commonly known as: KLONOPIN TAKE 1 TABLET BY MOUTH EVERY NIGHT AT BEDTIME AS  NEEDED FOR RESTLESS LEG   hydrochlorothiazide 12.5 MG tablet Commonly known as: HYDRODIURIL Take 1 tablet (12.5 mg total) by mouth daily.   ICAPS AREDS 2 PO Take by mouth.   losartan 50 MG tablet Commonly known as: COZAAR Take 1 tablet (50 mg total) by mouth daily.   metoprolol tartrate 50 MG tablet Commonly known as: LOPRESSOR Take 1 tablet (50 mg total) by mouth as needed (for a fast heartrate). Take 50 mg Lopressor as needed for a fast heart rate.   multivitamin with minerals Tabs tablet Take 1 tablet by mouth daily.          Objective:   Physical Exam BP 118/64 (BP Location: Left Arm, Patient Position: Sitting, Cuff Size: Normal)   Pulse 61   Temp 98.4 F (36.9 C) (Oral)   Resp 16   Ht 5\' 10"  (1.778 m)   Wt 237 lb 4 oz (107.6 kg)   SpO2 96%   BMI 34.04 kg/m  General:   Well developed, NAD, BMI noted. HEENT:  Normocephalic . Face symmetric, atraumatic Lungs:  CTA B Normal respiratory effort, no intercostal retractions, no accessory muscle use. Heart: RRR,  no murmur.  Lower extremities: no pretibial edema bilaterally  Skin: Not pale. Not jaundice Neurologic:  alert & oriented X3.  Speech normal, gait appropriate for age and unassisted Psych--  Cognition and judgment appear intact.  Cooperative with normal attention span and concentration.  Behavior appropriate. No anxious or depressed appearing.      Assessment     Assessment (new patient 05/17/2019) HTN High Cholesterol Restless leg (clonazepam prn) Parox. A. 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  Check BMP High cholesterol: Good response to Lipitor RLS: UDS and contract today, RF clonazepam as needed OSA: Recently diagnosed, started CPAP 2 weeks ago. A-flutter: DX 03/04/2020, anticoagulated, check CBC.  Seems to be tolerating well. Obesity: Discussed  options including weight watchers, wellness clinic information provided, OPTIVA? Preventive care: PNM 23 today RTC 5 to 6 months    This visit occurred during the SARS-CoV-2 public health emergency.  Safety protocols were in place, including screening questions prior to the visit, additional usage of staff PPE, and extensive cleaning of exam room while observing appropriate contact time as indicated for disinfecting solutions.

## 2020-08-28 NOTE — Assessment & Plan Note (Signed)
HTN: Well-controlled, continue HCTZ, losartan, metoprolol.  Check BMP High cholesterol: Good response to Lipitor RLS: UDS and contract today, RF clonazepam as needed OSA: Recently diagnosed, started CPAP 2 weeks ago. A-flutter: DX 03/04/2020, anticoagulated, check CBC.  Seems to be tolerating well. Obesity: Discussed options including weight watchers, wellness clinic information provided, OPTIVA? Preventive care: PNM 23 today RTC 5 to 6 months

## 2020-08-29 ENCOUNTER — Ambulatory Visit: Payer: Medicare Other | Admitting: Internal Medicine

## 2020-08-29 ENCOUNTER — Ambulatory Visit: Payer: Medicare Other | Admitting: Psychology

## 2020-08-29 LAB — DRUG MONITORING, PANEL 8 WITH CONFIRMATION, URINE
6 Acetylmorphine: NEGATIVE ng/mL (ref <10)
Alcohol Metabolites: NEGATIVE ng/mL
Amphetamines: NEGATIVE ng/mL (ref <500)
Benzodiazepines: NEGATIVE ng/mL (ref <100)
Buprenorphine, Urine: NEGATIVE ng/mL (ref <5)
Cocaine Metabolite: NEGATIVE ng/mL (ref <150)
Creatinine: 70.2 mg/dL
MDMA: NEGATIVE ng/mL (ref <500)
Marijuana Metabolite: NEGATIVE ng/mL (ref <20)
Opiates: NEGATIVE ng/mL (ref <100)
Oxidant: NEGATIVE ug/mL
Oxycodone: NEGATIVE ng/mL (ref <100)
pH: 6.9 (ref 4.5–9.0)

## 2020-08-29 LAB — DM TEMPLATE

## 2020-09-05 ENCOUNTER — Telehealth: Payer: Self-pay | Admitting: Pulmonary Disease

## 2020-09-05 NOTE — Telephone Encounter (Signed)
Spoke with rep from [employer]. She stated that [employer] had faxed an order for cpap supplies for the patient. Advised her that I had checked Dr. Bari Mantis folder and did not see forms for the patient. She have the ordering dept resend the fax. Confirmed the fax number.   Nothing further needed at time of call.

## 2020-09-11 ENCOUNTER — Ambulatory Visit: Payer: Medicare Other | Admitting: Cardiology

## 2020-09-12 ENCOUNTER — Ambulatory Visit (INDEPENDENT_AMBULATORY_CARE_PROVIDER_SITE_OTHER): Payer: Medicare Other | Admitting: Psychology

## 2020-09-12 DIAGNOSIS — F4322 Adjustment disorder with anxiety: Secondary | ICD-10-CM

## 2020-09-13 ENCOUNTER — Telehealth: Payer: Self-pay | Admitting: Pulmonary Disease

## 2020-09-13 ENCOUNTER — Ambulatory Visit: Payer: Medicare Other | Admitting: Pulmonary Disease

## 2020-09-13 NOTE — Telephone Encounter (Signed)
I have them both but he is not in the office till Monday then he will sign them I have called to let them know

## 2020-09-13 NOTE — Telephone Encounter (Signed)
Gregory Wilkins, please advise if you have this CMN. Thanks.

## 2020-09-13 NOTE — Telephone Encounter (Signed)
I will forward this message to Chantel she has been trying to process the CMNs since I moved to th [place] office 

## 2020-09-17 ENCOUNTER — Encounter (INDEPENDENT_AMBULATORY_CARE_PROVIDER_SITE_OTHER): Payer: Self-pay | Admitting: Family Medicine

## 2020-09-17 ENCOUNTER — Other Ambulatory Visit: Payer: Self-pay

## 2020-09-17 ENCOUNTER — Ambulatory Visit (INDEPENDENT_AMBULATORY_CARE_PROVIDER_SITE_OTHER): Payer: Medicare Other | Admitting: Family Medicine

## 2020-09-17 VITALS — BP 123/67 | HR 69 | Temp 98.2°F | Ht 69.0 in | Wt 230.0 lb

## 2020-09-17 DIAGNOSIS — Z6834 Body mass index (BMI) 34.0-34.9, adult: Secondary | ICD-10-CM | POA: Diagnosis not present

## 2020-09-17 DIAGNOSIS — Z0289 Encounter for other administrative examinations: Secondary | ICD-10-CM

## 2020-09-17 DIAGNOSIS — R0602 Shortness of breath: Secondary | ICD-10-CM | POA: Diagnosis not present

## 2020-09-17 DIAGNOSIS — Z1331 Encounter for screening for depression: Secondary | ICD-10-CM | POA: Diagnosis not present

## 2020-09-17 DIAGNOSIS — I10 Essential (primary) hypertension: Secondary | ICD-10-CM

## 2020-09-17 DIAGNOSIS — R5383 Other fatigue: Secondary | ICD-10-CM | POA: Diagnosis not present

## 2020-09-17 DIAGNOSIS — E669 Obesity, unspecified: Secondary | ICD-10-CM | POA: Diagnosis not present

## 2020-09-17 DIAGNOSIS — I4892 Unspecified atrial flutter: Secondary | ICD-10-CM

## 2020-09-17 DIAGNOSIS — R739 Hyperglycemia, unspecified: Secondary | ICD-10-CM

## 2020-09-17 DIAGNOSIS — G4733 Obstructive sleep apnea (adult) (pediatric): Secondary | ICD-10-CM

## 2020-09-17 DIAGNOSIS — E559 Vitamin D deficiency, unspecified: Secondary | ICD-10-CM

## 2020-09-18 LAB — COMPREHENSIVE METABOLIC PANEL
ALT: 17 IU/L (ref 0–44)
AST: 21 IU/L (ref 0–40)
Albumin/Globulin Ratio: 1.7 (ref 1.2–2.2)
Albumin: 4.5 g/dL (ref 3.7–4.7)
Alkaline Phosphatase: 87 IU/L (ref 44–121)
BUN/Creatinine Ratio: 12 (ref 10–24)
BUN: 11 mg/dL (ref 8–27)
Bilirubin Total: 0.7 mg/dL (ref 0.0–1.2)
CO2: 23 mmol/L (ref 20–29)
Calcium: 9.4 mg/dL (ref 8.6–10.2)
Chloride: 102 mmol/L (ref 96–106)
Creatinine, Ser: 0.89 mg/dL (ref 0.76–1.27)
GFR calc Af Amer: 95 mL/min/{1.73_m2} (ref >59)
GFR calc non Af Amer: 82 mL/min/{1.73_m2} (ref >59)
Globulin, Total: 2.7 g/dL (ref 1.5–4.5)
Glucose: 86 mg/dL (ref 65–99)
Potassium: 3.7 mmol/L (ref 3.5–5.2)
Sodium: 141 mmol/L (ref 134–144)
Total Protein: 7.2 g/dL (ref 6.0–8.5)

## 2020-09-18 LAB — LIPID PANEL WITH LDL/HDL RATIO
Cholesterol, Total: 148 mg/dL (ref 100–199)
HDL: 74 mg/dL (ref >39)
LDL Chol Calc (NIH): 64 mg/dL (ref 0–99)
LDL/HDL Ratio: 0.9 ratio (ref 0.0–3.6)
Triglycerides: 44 mg/dL (ref 0–149)
VLDL Cholesterol Cal: 10 mg/dL (ref 5–40)

## 2020-09-18 LAB — T4: T4, Total: 7.5 ug/dL (ref 4.5–12.0)

## 2020-09-18 LAB — TSH: TSH: 1.6 u[IU]/mL (ref 0.450–4.500)

## 2020-09-18 LAB — INSULIN, RANDOM: INSULIN: 9.4 u[IU]/mL (ref 2.6–24.9)

## 2020-09-18 LAB — HEMOGLOBIN A1C
Est. average glucose Bld gHb Est-mCnc: 105 mg/dL
Hgb A1c MFr Bld: 5.3 % (ref 4.8–5.6)

## 2020-09-18 LAB — VITAMIN D 25 HYDROXY (VIT D DEFICIENCY, FRACTURES): Vit D, 25-Hydroxy: 22.6 ng/mL — ABNORMAL LOW (ref 30.0–100.0)

## 2020-09-18 LAB — T3: T3, Total: 138 ng/dL (ref 71–180)

## 2020-09-19 NOTE — Progress Notes (Signed)
Dear Dr. Larose Wilkins,   Thank you for referring Gregory Wilkins to our clinic. The following note includes my evaluation and treatment recommendations.  Chief Complaint:   Gregory Wilkins (MR# [id number]) is a 80 y.o. male who presents for evaluation and treatment of obesity and related comorbidities. Current BMI is Body mass index is 33.97 kg/m. Gregory Wilkins has been struggling with his weight for many years and has been unsuccessful in either losing weight, maintaining weight loss, or reaching his healthy weight goal.  Gregory Wilkins is currently in the action stage of change and ready to dedicate time achieving and maintaining a healthier weight. Gregory Wilkins is interested in becoming our patient and working on intensive lifestyle modifications including (but not limited to) diet and exercise for weight loss.  Gregory Wilkins was referred by Dr. Larose Wilkins. He is looking for motivation to help adhere to a plan. He skips breakfast daily (not hungry). Around 11 AM snack- peanut butter sandwich and may have a handful of pretzels; Lunch- [employer] burger with only 1 side bun; may have a snack- peanuts pistachios; Dinner- 5 oz meat, 1.5 cup starch, and 1/3 cup vegetables- feel satisfied; after dinner snack- peanut butter sandwich.   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  Depression Screen Gregory Wilkins's Food and Mood (modified PHQ-9) score was 4.  Depression screen [facility] 2/9 09/17/2020  Decreased Interest 0   Down, Depressed, Hopeless 0  PHQ - 2 Score 0  Altered sleeping 1  Tired, decreased energy 1  Change in appetite 2  Feeling bad or failure about yourself  0  Trouble concentrating 0  Moving slowly or fidgety/restless 0  Suicidal thoughts 0  PHQ-9 Score 4  Difficult doing work/chores Not difficult at all   Subjective:   1. Other fatigue Gregory Wilkins admits to daytime somnolence and denies waking up still tired. Patent has a history of symptoms of daytime fatigue. Gregory Wilkins generally gets 5-7 hours of sleep per night, and states that he has generally restful sleep. Snoring is present. Apneic episodes are not present. Epworth Sleepiness Score is 4. EKG- sinus bradycardia 54 bpm.   2. SOB (shortness of breath) on exertion Gregory Wilkins notes increasing shortness of breath with exercising and seems to be worsening over time with weight gain. He notes getting out of breath sooner with activity than he used to. This has gotten worse recently. Gregory Wilkins denies shortness of breath at rest or orthopnea. EKG- sinus bradycardia 54 bpm.  3. Hyperglycemia Gregory Wilkins has a history of elevated blood sugar. No A1c on file.  4. Paroxysmal atrial flutter [facility]) Gregory Wilkins sees Gregory Wilkins. He is on Eliquis and metoprolol.  5. Essential hypertension Gregory Wilkins is on losartan. He sees cardiology. His BP is controlled today.   BP Readings from Last 3 Encounters:  09/17/20 123/67  08/28/20 118/64  03/23/20 128/68   6. Vitamin D deficiency Diagnosis likely due to obesity. Gregory Wilkins is not on a Vit D supplement.  7. OSA (obstructive sleep apnea)  Gregory Wilkins is managed by Dr. Elsworth Wilkins. He is now being treated.  Assessment/Plan:   1. Other fatigue Gregory Wilkins does not feel that his weight is causing his energy to be lower than it should be. Fatigue may be related to obesity, depression or many other causes. Labs will be ordered, and in the meanwhile, Gregory Wilkins will focus on self care including making healthy food choices, increasing physical activity and focusing on  stress reduction. Check labs today.  - EKG 12-Lead - Comprehensive metabolic panel - T3 - T4 - TSH  2. SOB (shortness of breath) on exertion Gregory Wilkins does feel that he gets out of breath more easily that he used to when he exercises. Gregory Wilkins's shortness of breath appears to be obesity related and exercise induced. He has agreed to work on weight loss and gradually increase exercise to treat his exercise induced shortness of breath. Will continue to monitor closely.  3. Hyperglycemia Fasting labs will be obtained and results with be discussed with Gregory Wilkins in 2 weeks at his follow up visit. In the meanwhile Gregory Wilkins was started on a lower simple carbohydrate diet and will work on weight loss efforts. Check labs today.  - Hemoglobin A1c - Insulin, random  4. Paroxysmal atrial flutter (Gregory Wilkins) Follow up with cardiology.  5. Essential hypertension Gregory Wilkins is working on healthy weight loss and exercise to improve blood pressure control. We will watch for signs of hypotension as he continues his lifestyle modifications. Follow up with cardiology. Check labs today.  - Lipid Panel With LDL/HDL Ratio  6. Vitamin D deficiency Low Vitamin D level contributes to fatigue and are associated with obesity, breast, and colon cancer. He agrees to follow-up for routine testing of Vitamin D, at least 2-3 times per year to avoid over-replacement. Check labs today.  - VITAMIN D 25 Hydroxy (Vit-D Deficiency, Fractures)  7. OSA (obstructive sleep apnea) Intensive lifestyle modifications are the first line treatment for this issue. We discussed several lifestyle modifications today and he will continue to work on diet, exercise and weight loss efforts. We will continue to monitor. Orders and follow up as documented in patient record. Follow up with Dr. Bari Wilkins office.  8. Depression screening Vir had a negative depression screening. Depression is commonly associated with obesity and often results in emotional eating behaviors.  We will monitor this closely and work on CBT to help improve the non-hunger eating patterns. Referral to Psychology may be required if no improvement is seen as he continues in our clinic.  9. Class 1 obesity with serious comorbidity and body mass index (BMI) of 34.0 to 34.9 in adult, unspecified obesity type Caidin is currently in the action stage of change and his goal is to continue with weight loss efforts. I recommend Heinz begin the structured treatment plan as follows:  He has agreed to the Category 3 Plan.  Exercise goals: No exercise has been prescribed at this time.   Behavioral modification strategies: increasing lean protein intake, meal planning and cooking strategies, keeping healthy foods in the home and planning for success.  He was informed of the importance of frequent follow-up visits to maximize his success with intensive lifestyle modifications for his multiple health conditions. He was informed we would discuss his lab results at his next visit unless there is a critical issue that needs to be addressed sooner. Noor agreed to keep his next visit at the agreed upon time to discuss these results.  Objective:   Blood pressure 123/67, pulse 69, temperature 98.2 F (36.8  C), temperature source Oral, height 5\' 9"  (1.753 m), weight 230 lb (104.3 kg), SpO2 98 %. Body mass index is 33.97 kg/m.  EKG: Normal sinus rhythm, rate 54.  Indirect Calorimeter completed today shows a VO2 of 217 and a REE of 1508.  His calculated basal metabolic rate is 6440 thus his basal metabolic rate is worse than expected.  General: Cooperative, alert, well developed, in no acute distress. HEENT: Conjunctivae and lids unremarkable. Cardiovascular: Regular rhythm.  Lungs: Normal work of breathing. Neurologic: No focal deficits.   Lab Results  Component Value Date   CREATININE 0.89 09/17/2020   BUN 11 09/17/2020   NA 141 09/17/2020   K 3.7 09/17/2020   CL 102 09/17/2020   CO2 23 09/17/2020    Lab Results  Component Value Date   ALT 17 09/17/2020   AST 21 09/17/2020   ALKPHOS 87 09/17/2020   BILITOT 0.7 09/17/2020   Lab Results  Component Value Date   HGBA1C 5.3 09/17/2020   HGBA1C 5.3 11/10/2008   Lab Results  Component Value Date   INSULIN 9.4 09/17/2020   Lab Results  Component Value Date   TSH 1.600 09/17/2020   Lab Results  Component Value Date   CHOL 148 09/17/2020   HDL 74 09/17/2020   LDLCALC 64 09/17/2020   LDLDIRECT 124.6 11/10/2008   TRIG 44 09/17/2020   CHOLHDL 2 03/12/2020   Lab Results  Component Value Date   WBC 6.5 08/28/2020   HGB 14.5 08/28/2020   HCT 43.8 08/28/2020   MCV 90.3 08/28/2020   PLT 204.0 08/28/2020   Lab Results  Component Value Date   IRON 96 04/19/2015   FERRITIN 36.2 04/19/2015   Obesity Behavioral Intervention:   Approximately 15 minutes were spent on the discussion below.  ASK: We discussed the diagnosis of obesity with Gregory Wilkins today and Brace agreed to give [place] permission to discuss obesity behavioral modification therapy today.  ASSESS: Noland has the diagnosis of obesity and his BMI today is 34.1. Chaim is in the action stage of change.   ADVISE: Zi was educated on the multiple health risks of obesity as well as the benefit of weight loss to improve his health. He was advised of the need for long term treatment and the importance of lifestyle modifications to improve his current health and to decrease his risk of future health problems.  AGREE: Multiple dietary modification options and treatment options were discussed and Rakwon agreed to follow the recommendations documented in the above note.  ARRANGE: Stevan was educated on the importance of frequent visits to treat obesity as outlined per CMS and USPSTF guidelines and agreed to schedule his next follow up appointment today.  Attestation Statements:   Reviewed by clinician on day of visit: allergies, medications, problem list, medical history, surgical  history, family history, social history, and previous encounter notes.  Coral Ceo, am acting as transcriptionist for Coralie Common, MD.   This is the patient's first visit at [facility]. The patient's NEW PATIENT PACKET was reviewed at length. Included in the packet: current and past health history, medications, allergies, ROS, gynecologic history (women only), surgical history, family history, social history, weight history, weight loss surgery history (for those that have had weight loss surgery), nutritional evaluation, mood and food questionnaire, PHQ9, Epworth questionnaire, sleep habits questionnaire, patient life and health improvement goals questionnaire. These will all be scanned into the patient's chart under media.   During the visit, I independently reviewed the  patient's EKG, bioimpedance scale results, and indirect calorimeter results. I used this information to tailor a meal plan for the patient that will help him to lose weight and will improve his obesity-related conditions going forward. I performed a medically necessary appropriate examination and/or evaluation. I discussed the assessment and treatment plan with the patient. The patient was provided an opportunity to ask questions and all were answered. The patient agreed with the plan and demonstrated an understanding of the instructions. Labs were ordered at this visit and will be reviewed at the next visit unless more critical results need to be addressed immediately. Clinical information was updated and documented in the EMR.   Time spent on visit including pre-visit chart review and post-visit care was 45 minutes.   A separate 15 minutes was spent on risk counseling (see above).  I have reviewed the above documentation for accuracy and completeness, and I agree with the above. - Jinny Blossom, MD

## 2020-09-26 ENCOUNTER — Ambulatory Visit (INDEPENDENT_AMBULATORY_CARE_PROVIDER_SITE_OTHER): Payer: Medicare Other | Admitting: Psychology

## 2020-09-26 DIAGNOSIS — F4322 Adjustment disorder with anxiety: Secondary | ICD-10-CM | POA: Diagnosis not present

## 2020-10-01 ENCOUNTER — Other Ambulatory Visit: Payer: Self-pay

## 2020-10-01 ENCOUNTER — Encounter (INDEPENDENT_AMBULATORY_CARE_PROVIDER_SITE_OTHER): Payer: Self-pay | Admitting: Family Medicine

## 2020-10-01 ENCOUNTER — Ambulatory Visit (INDEPENDENT_AMBULATORY_CARE_PROVIDER_SITE_OTHER): Payer: Medicare Other | Admitting: Family Medicine

## 2020-10-01 VITALS — BP 110/69 | HR 60 | Temp 98.1°F | Ht 69.0 in | Wt 223.0 lb

## 2020-10-01 DIAGNOSIS — E559 Vitamin D deficiency, unspecified: Secondary | ICD-10-CM | POA: Diagnosis not present

## 2020-10-01 DIAGNOSIS — I2510 Atherosclerotic heart disease of native coronary artery without angina pectoris: Secondary | ICD-10-CM | POA: Diagnosis not present

## 2020-10-01 DIAGNOSIS — I10 Essential (primary) hypertension: Secondary | ICD-10-CM

## 2020-10-01 DIAGNOSIS — E7849 Other hyperlipidemia: Secondary | ICD-10-CM | POA: Diagnosis not present

## 2020-10-01 DIAGNOSIS — E669 Obesity, unspecified: Secondary | ICD-10-CM

## 2020-10-01 DIAGNOSIS — E8881 Metabolic syndrome: Secondary | ICD-10-CM

## 2020-10-01 DIAGNOSIS — I2584 Coronary atherosclerosis due to calcified coronary lesion: Secondary | ICD-10-CM

## 2020-10-01 DIAGNOSIS — Z6833 Body mass index (BMI) 33.0-33.9, adult: Secondary | ICD-10-CM | POA: Diagnosis not present

## 2020-10-01 MED ORDER — VITAMIN D (ERGOCALCIFEROL) 1.25 MG (50000 UNIT) PO CAPS: 50000.0000 [IU] | ORAL_CAPSULE | ORAL | 0 refills | Status: DC

## 2020-10-03 NOTE — Progress Notes (Signed)
Chief Complaint:   OBESITY Gregory Wilkins is here to discuss his progress with his obesity treatment plan along with follow-up of his obesity related diagnoses. Gregory Wilkins is on the Category 3 Plan and states he is following his eating plan approximately 98% of the time. Gregory Wilkins states he is yoga, bike, and elliptical 60 minutes 7 times per week.  Today's visit was #: 2 Starting weight: 230 lbs Starting date: 09/17/2020 Today's weight: 223 lbs Today's date: 10/01/2020 Total lbs lost to date: 7 lbs Total lbs lost since last in-office visit: 7 lbs  Interim History: Gregory Wilkins felt the last few weeks went well. He kept a food diary since timing wasn't important. He did use some barbecue sauce which was pepper flakes and vinegar. He is interested in making egg salad with yogurt. He has no upcoming plans. His only hunger right now is before a meal. For snacks pt is doing cashew packs, pretzel packs, 50 calorie meat sticks.  Subjective:   1. Vitamin D deficiency New. Discussed labs with patient today. Pt denies nausea, vomiting, and muscle weakness but notes fatigue. Pt is on not on Vit D supplementation. Vit D level is 22.6.  2. Other hyperlipidemia Discussed labs with patient today. Pt denies transaminitis or myalgias. He is on Lipitor 10 mg daily.  Lab Results  Component Value Date   ALT 17 09/17/2020   AST 21 09/17/2020   ALKPHOS 87 09/17/2020   BILITOT 0.7 09/17/2020   Lab Results  Component Value Date   CHOL 148 09/17/2020   HDL 74 09/17/2020   LDLCALC 64 09/17/2020   LDLDIRECT 124.6 11/10/2008   TRIG 44 09/17/2020   CHOLHDL 2 03/12/2020    3. Essential hypertension Pt's BP is well controlled today.  He denies chest pain, chest pressure and headache. He is on lopressor. Cozaar, and HCTZ.  BP Readings from Last 3 Encounters:  10/01/20 110/69  09/17/20 123/67  08/28/20 118/64    4. Insulin resistance Discussed labs with patient today. Pt's A1c 5.3 with an insulin level of 9.4. He is not  on medication. He has no history of pre-diabetes.  Lab Results  Component Value Date   INSULIN 9.4 09/17/2020   Lab Results  Component Value Date   HGBA1C 5.3 09/17/2020    Assessment/Plan:   1. Vitamin D deficiency Low Vitamin D level contributes to fatigue and are associated with obesity, breast, and colon cancer. He agrees to start to take prescription Vitamin D @50 ,000 IU every week and will follow-up for routine testing of Vitamin D, at least 2-3 times per year to avoid over-replacement.  - Vitamin D, Ergocalciferol, (DRISDOL) 1.25 MG (50000 UNIT) CAPS capsule; Take 1 capsule (50,000 Units total) by mouth every 7 (seven) days.  Dispense: 4 capsule; Refill: 0  2. Other hyperlipidemia Cardiovascular risk and specific lipid/LDL goals reviewed.  We discussed several lifestyle modifications today and Gregory Wilkins will continue to work on diet, exercise and weight loss efforts. Orders and follow up as documented in patient record. Continue current treatment plan.  Counseling Intensive lifestyle modifications are the first line treatment for this issue. . Dietary changes: Increase soluble fiber. Decrease simple carbohydrates. . Exercise changes: Moderate to vigorous-intensity aerobic activity 150 minutes per week if tolerated. . Lipid-lowering medications: see documented in medical record.  3. Essential hypertension Gregory Wilkins is working on healthy weight loss and exercise to improve blood pressure control. We will watch for signs of hypotension as he continues his lifestyle modifications. Continue current treatment plan.  4. Insulin resistance Gregory Wilkins will continue to work on weight loss, exercise, and decreasing simple carbohydrates to help decrease the risk of diabetes. Gregory Wilkins agreed to follow-up with [place] as directed to closely monitor his progress. Repeat labs in 3 months.  5. Class 1 obesity with serious comorbidity and body mass index (BMI) of 33.0 to 33.9 in adult, unspecified obesity type Gregory Wilkins is  currently in the action stage of change. As such, his goal is to continue with weight loss efforts. He has agreed to the Category 3 Plan.   Exercise goals: All adults should avoid inactivity. Some physical activity is better than none, and adults who participate in any amount of physical activity gain some health benefits.  Behavioral modification strategies: increasing lean protein intake, meal planning and cooking strategies and keeping healthy foods in the home.  Gregory Wilkins has agreed to follow-up with our clinic in 2 weeks. He was informed of the importance of frequent follow-up visits to maximize his success with intensive lifestyle modifications for his multiple health conditions.   Objective:   Blood pressure 110/69, pulse 60, temperature 98.1 F (36.7 C), temperature source Oral, height 5\' 9"  (1.753 m), weight 223 lb (101.2 kg), SpO2 96 %. Body mass index is 32.93 kg/m.  General: Cooperative, alert, well developed, in no acute distress. HEENT: Conjunctivae and lids unremarkable. Cardiovascular: Regular rhythm.  Lungs: Normal work of breathing. Neurologic: No focal deficits.   Lab Results  Component Value Date   CREATININE 0.89 09/17/2020   BUN 11 09/17/2020   NA 141 09/17/2020   K 3.7 09/17/2020   CL 102 09/17/2020   CO2 23 09/17/2020   Lab Results  Component Value Date   ALT 17 09/17/2020   AST 21 09/17/2020   ALKPHOS 87 09/17/2020   BILITOT 0.7 09/17/2020   Lab Results  Component Value Date   HGBA1C 5.3 09/17/2020   HGBA1C 5.3 11/10/2008   Lab Results  Component Value Date   INSULIN 9.4 09/17/2020   Lab Results  Component Value Date   TSH 1.600 09/17/2020   Lab Results  Component Value Date   CHOL 148 09/17/2020   HDL 74 09/17/2020   LDLCALC 64 09/17/2020   LDLDIRECT 124.6 11/10/2008   TRIG 44 09/17/2020   CHOLHDL 2 03/12/2020   Lab Results  Component Value Date   WBC 6.5 08/28/2020   HGB 14.5 08/28/2020   HCT 43.8 08/28/2020   MCV 90.3 08/28/2020    PLT 204.0 08/28/2020   Lab Results  Component Value Date   IRON 96 04/19/2015   FERRITIN 36.2 04/19/2015    Obesity Behavioral Intervention:   Approximately 15 minutes were spent on the discussion below.  ASK: We discussed the diagnosis of obesity with Gregory Wilkins today and Gregory Wilkins agreed to give [place] permission to discuss obesity behavioral modification therapy today.  ASSESS: Gregory Wilkins has the diagnosis of obesity and his BMI today is 33.0. Gregory Wilkins is in the action stage of change.   ADVISE: Gregory Wilkins was educated on the multiple health risks of obesity as well as the benefit of weight loss to improve his health. He was advised of the need for long term treatment and the importance of lifestyle modifications to improve his current health and to decrease his risk of future health problems.  AGREE: Multiple dietary modification options and treatment options were discussed and Gregory Wilkins agreed to follow the recommendations documented in the above note.  ARRANGE: Gregory Wilkins was educated on the importance of frequent visits to treat obesity as outlined  per CMS and USPSTF guidelines and agreed to schedule his next follow up appointment today.  Attestation Statements:   Reviewed by clinician on day of visit: allergies, medications, problem list, medical history, surgical history, family history, social history, and previous encounter notes.  Gregory Wilkins, am acting as transcriptionist for Gregory Common, MD.   I have reviewed the above documentation for accuracy and completeness, and I agree with the above. - Gregory Blossom, MD

## 2020-10-09 ENCOUNTER — Other Ambulatory Visit: Payer: Self-pay | Admitting: Cardiology

## 2020-10-09 IMAGING — CT CT ABD-PELV W/ CM
2 of 5 series · 16 of 46 positions shown, 18 images · IV contrast (APPLIED)
Comparison: None.

CLINICAL DATA: Jaundice. Hypotension. Fatigue. Weight loss.

EXAM:
CT ABDOMEN AND PELVIS WITH CONTRAST
TECHNIQUE: Multidetector CT imaging of the abdomen and pelvis was performed
using the standard protocol following bolus administration of
intravenous contrast.
CONTRAST:  100mL OMNIPAQUE IOHEXOL 300 MG/ML  SOLN

[Series 2: axial st · axial · 0.86mm/px · z∈[-302,+123]mm · 13 of 97 slices shown, 15 images]
[im 6/97  soft-tissue]
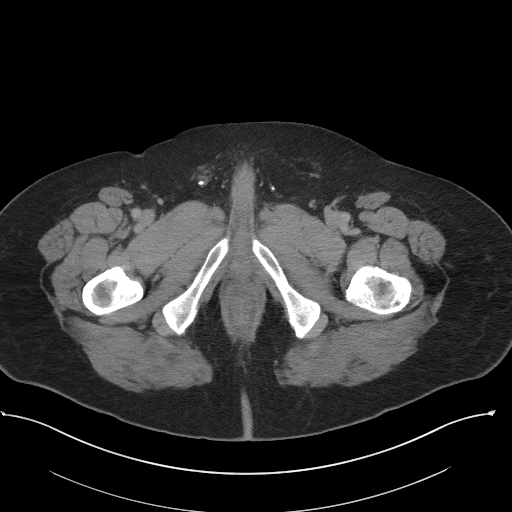
[im 6/97  bone]
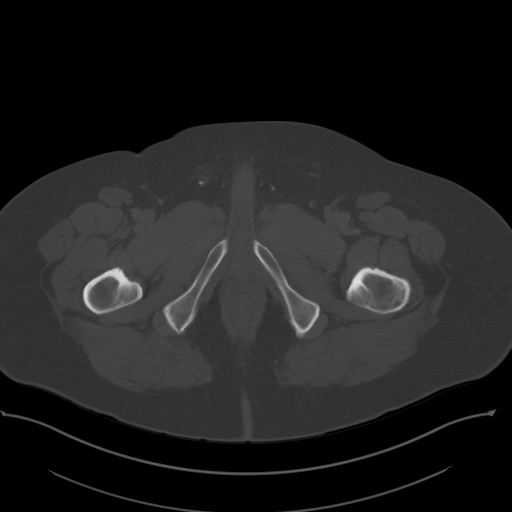
[im 11/97  soft-tissue]
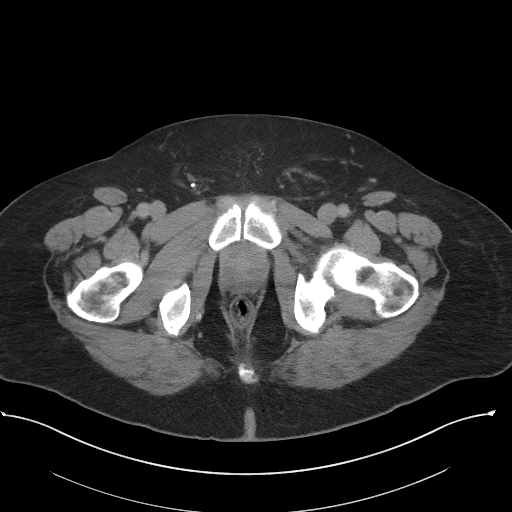
[im 22/97  soft-tissue]
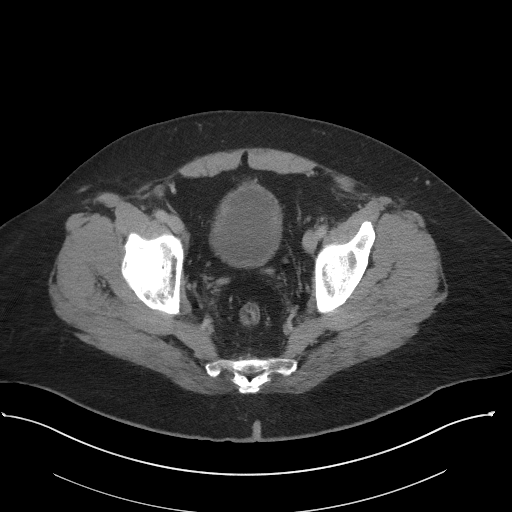
[im 27/97  soft-tissue]
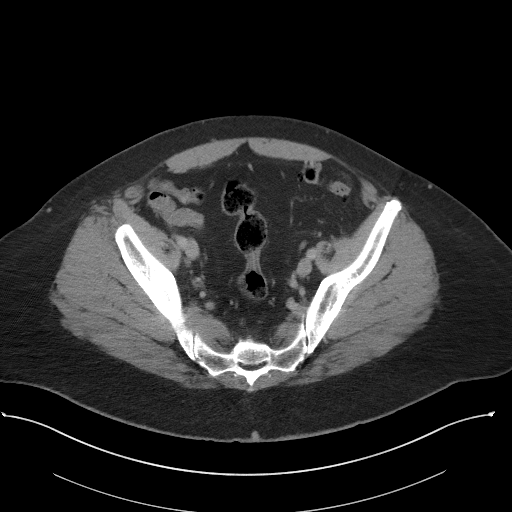
[im 33/97  soft-tissue]
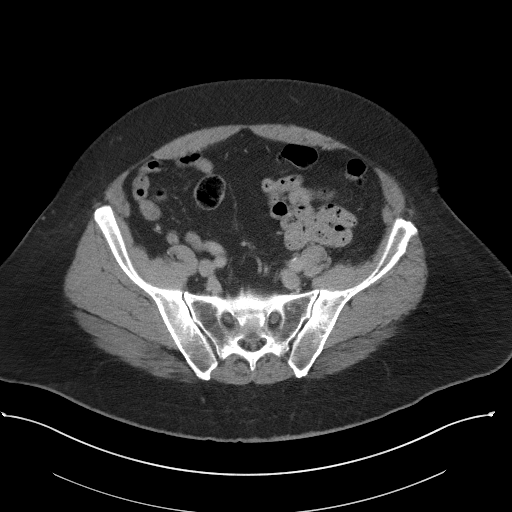
[im 43/97  soft-tissue]
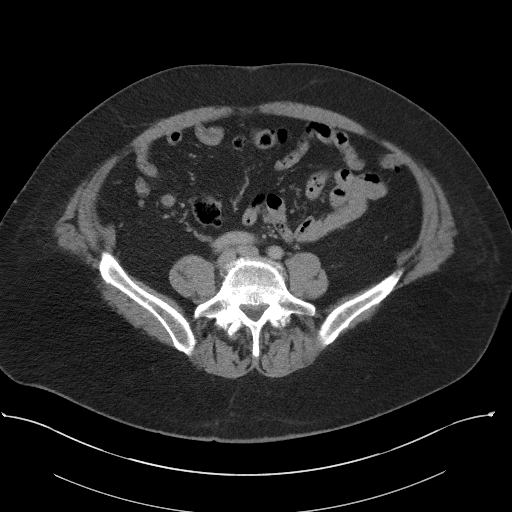
[im 49/97  soft-tissue]
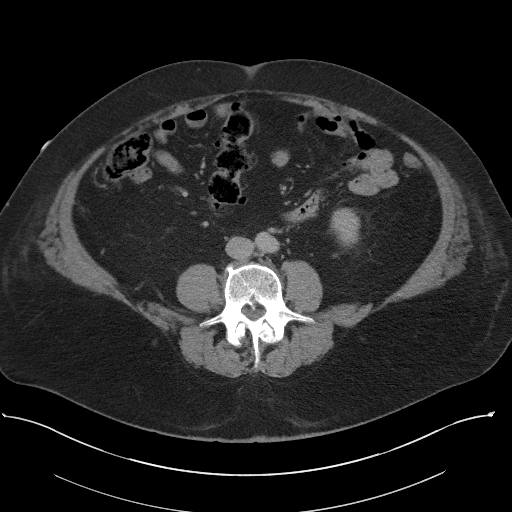
[im 54/97  soft-tissue]
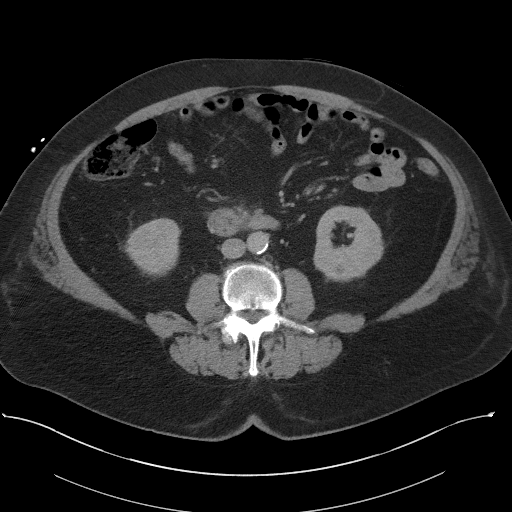
[im 65/97  soft-tissue]
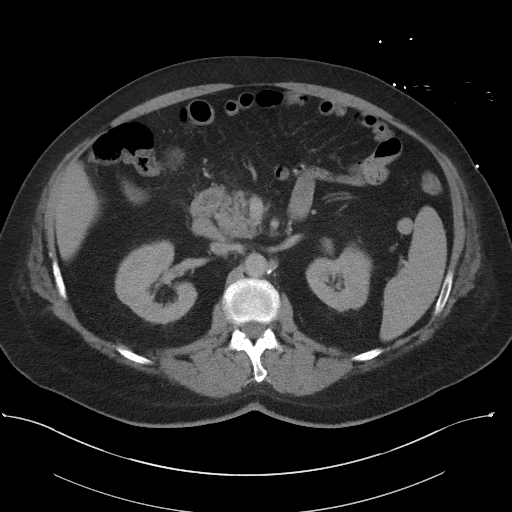
[im 65/97  bone]
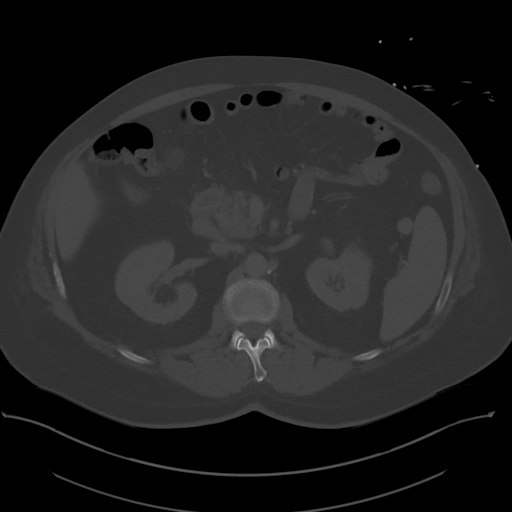
[im 70/97  soft-tissue]
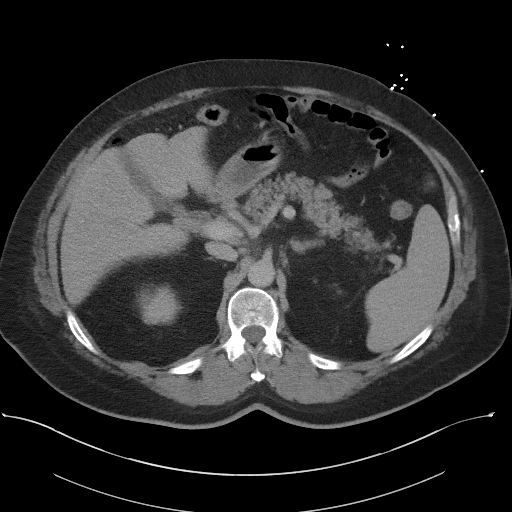
[im 75/97  soft-tissue]
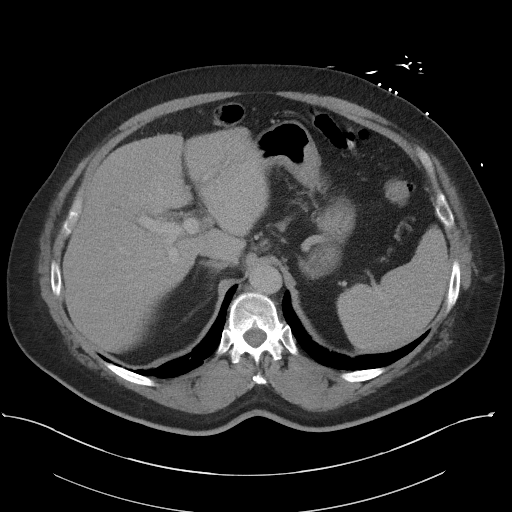
[im 86/97  soft-tissue]
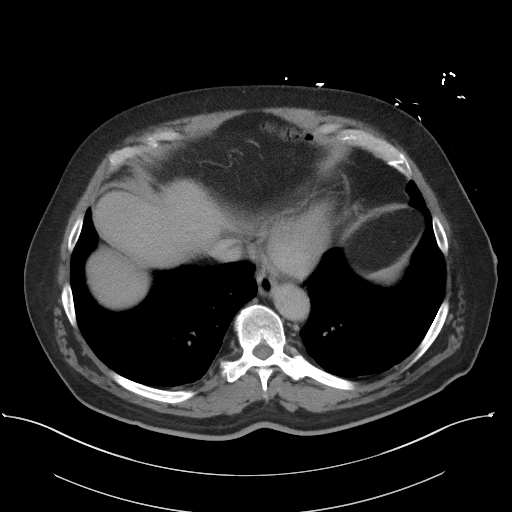
[im 91/97  soft-tissue]
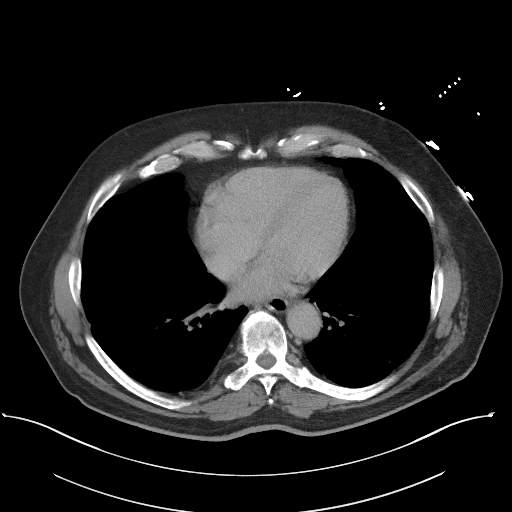

[Series 5: coronal st · coronal · 0.80mm/px · 3 of 113 slices shown]
[im 38/113  soft-tissue]
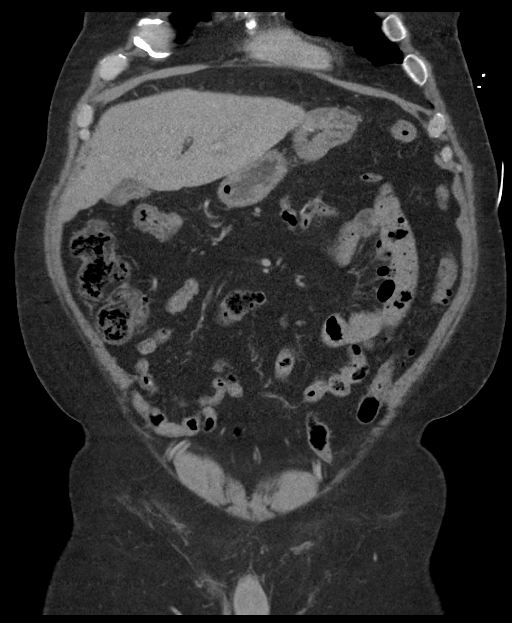
[im 50/113  soft-tissue]
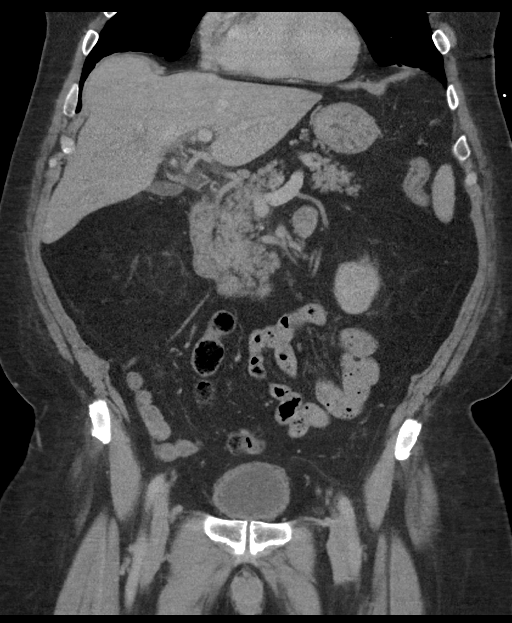
[im 63/113  soft-tissue]
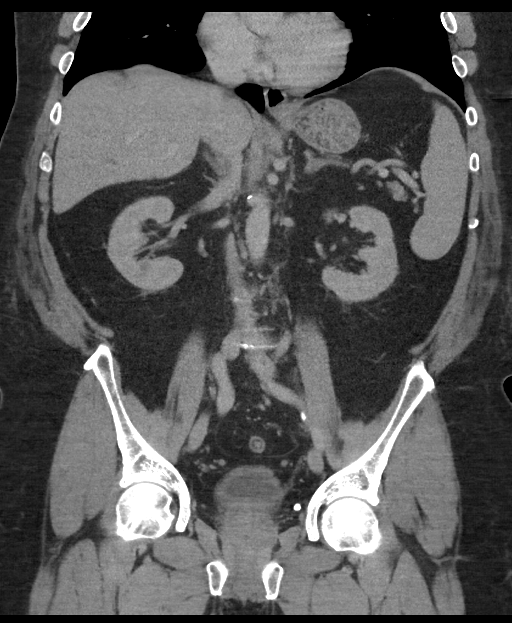

[16 of 46 positions shown; findings below may reference images not displayed]

FINDINGS: Lower Chest: No acute findings.

Hepatobiliary: No hepatic masses identified. Gallbladder is
nondistended. Mild gallbladder wall thickening is seen without
evidence of pericholecystic inflammatory changes. No evidence of
biliary ductal dilatation.

Pancreas:  No mass or inflammatory changes.

Spleen: Within normal limits in size and appearance.

Adrenals/Urinary Tract: No masses identified. Small right renal cyst
noted. No evidence of hydronephrosis.

Stomach/Bowel: No evidence of obstruction, inflammatory process or
abnormal fluid collections. Normal appendix visualized.
Diverticulosis is seen mainly involving the descending and sigmoid
colon, however there is no evidence of diverticulitis.

Vascular/Lymphatic: No pathologically enlarged lymph nodes. No
abdominal aortic aneurysm. Aortic atherosclerosis.

Reproductive:  Mildly enlarged prostate.

Other:  Small left inguinal hernia containing only fat.

Musculoskeletal:  No suspicious bone lesions identified.
IMPRESSION: Nondistended gallbladder shows mild wall thickening which is
nonspecific. No other hepatobiliary abnormality identified.

Colonic diverticulosis, without radiographic evidence of
diverticulitis or other acute findings.

Mildly enlarged prostate.

Small left inguinal hernia containing only fat.

## 2020-10-10 ENCOUNTER — Ambulatory Visit (INDEPENDENT_AMBULATORY_CARE_PROVIDER_SITE_OTHER): Payer: Medicare Other | Admitting: Psychology

## 2020-10-10 ENCOUNTER — Telehealth: Payer: Self-pay

## 2020-10-10 DIAGNOSIS — M2550 Pain in unspecified joint: Secondary | ICD-10-CM | POA: Insufficient documentation

## 2020-10-10 DIAGNOSIS — F1010 Alcohol abuse, uncomplicated: Secondary | ICD-10-CM | POA: Insufficient documentation

## 2020-10-10 DIAGNOSIS — D649 Anemia, unspecified: Secondary | ICD-10-CM | POA: Insufficient documentation

## 2020-10-10 DIAGNOSIS — G4730 Sleep apnea, unspecified: Secondary | ICD-10-CM | POA: Insufficient documentation

## 2020-10-10 DIAGNOSIS — N400 Enlarged prostate without lower urinary tract symptoms: Secondary | ICD-10-CM | POA: Insufficient documentation

## 2020-10-10 DIAGNOSIS — F4322 Adjustment disorder with anxiety: Secondary | ICD-10-CM | POA: Diagnosis not present

## 2020-10-10 DIAGNOSIS — R079 Chest pain, unspecified: Secondary | ICD-10-CM | POA: Insufficient documentation

## 2020-10-10 DIAGNOSIS — H2513 Age-related nuclear cataract, bilateral: Secondary | ICD-10-CM | POA: Insufficient documentation

## 2020-10-10 DIAGNOSIS — C4490 Unspecified malignant neoplasm of skin, unspecified: Secondary | ICD-10-CM | POA: Insufficient documentation

## 2020-10-10 DIAGNOSIS — I10 Essential (primary) hypertension: Secondary | ICD-10-CM | POA: Insufficient documentation

## 2020-10-10 DIAGNOSIS — N200 Calculus of kidney: Secondary | ICD-10-CM | POA: Insufficient documentation

## 2020-10-10 DIAGNOSIS — M1990 Unspecified osteoarthritis, unspecified site: Secondary | ICD-10-CM | POA: Insufficient documentation

## 2020-10-10 NOTE — Telephone Encounter (Signed)
Left voicemail to see if patient has received CPAP machine yet. If pt did receive machine, please advise him to bring in the SD card for visit tomorrow. If he has not received the machine, please advise pt to reschedule appointment for later date after receiving machine.

## 2020-10-10 NOTE — Progress Notes (Signed)
@Patient  ID: Gregory Wilkins, male    DOB: [birth year], 80 y.o.   MRN: [id number]  Chief Complaint  Patient presents with  . Follow-up    After starting CPAP    Referring provider: Colon Branch, MD  HPI: 80 year old male, former smoker quit 1984 (52-pack-year history).  Past medical history significant for obstructive sleep apnea, nocturnal hypoxia, allergic rhinitis, hypertension, a flutter, mixed dyslipidemia, restless leg syndrome, skin cancer, obesity.  Patient of Dr. Elsworth Soho, seen for initial consult on 11/04/2019.  Home sleep study 04/03/20 showed severe obstructive sleep apnea, AHI 57/hr.  CPAP titration completed 05/17/20, recommended pressure 9 cm H20 with no oxygen requirement.   10/11/2020 Patient presents today for 3 month follow-up/new CPAP start. He reports compliance with CPAP. He brought his SD card today but there was not data. He is having no issues with mask or pressure setting. He reports a fair about of sinus drainage at night. He is wearing a full face mask. He gets about 6 hours of sleep each night. Reports that he is waking up less at night. He is working on weight loss. He has been tired a couple of gyms after going to the gym which is new for him. He is on a 1,000 calorie diet. Epworth score today was 3/24. DME company is [employer].   Allergies  Allergen Reactions  . Penicillins Hives    hives  . Ramipril Anaphylaxis and Swelling    Immunization History  Administered Date(s) Administered  . Influenza Split 05/28/2013  . Influenza, High Dose Seasonal PF 06/02/2014, 04/08/2018, 03/07/2019, 04/17/2020  . Influenza, Seasonal, Injecte, Preservative Fre 06/30/2012  . Influenza,inj,Quad PF,6+ Mos 04/19/2015  . PFIZER(Purple Top)SARS-COV-2 Vaccination 08/10/2019, 08/30/2019, 04/24/2020  . Pneumococcal Conjugate-13 04/29/2016  . Pneumococcal Polysaccharide-23 05/09/2010, 08/28/2020  . Td 12/26/2005, 01/09/2011  . Zoster 11/09/2008  . Zoster Recombinat (Shingrix)  08/26/2018, 03/07/2019    Past Medical History:  Diagnosis Date  . Acne rosacea 12/23/2006   Qualifier: Diagnosis of  By: Linna Darner MD, Gwyndolyn Saxon   Dr Crista Luria   Formatting of this note might be different from the original. Overview:  Qualifier: Diagnosis of  By: Linna Darner MD, Gwyndolyn Saxon   Dr Crista Luria Overview:  Overview:  Qualifier: Diagnosis of  By: Linna Darner MD, Gwyndolyn Saxon   Dr Crista Luria  Overview:  Qualifier: Diagnosis of  By: Linna Darner MD, Gwyndolyn Saxon   Dr Crista Luria Overview:  Overview:  Overview:  . Alcohol abuse   . Allergic rhinitis   . Allergic rhinitis 12/23/2006   Qualifier: Diagnosis of  By: Linna Darner MD, Gwyndolyn Saxon   Onset:in high school Character: perennial but increased seasonally Triggers :cat, dust, pollen Allergy testing: Dr Bernita Buffy Maintenance medications/ response:Flonase/ Nasonex; saline wash; Allegra Smoking history:age 75- 34, up to 2 ppd Family history pulmonary disease: no    Formatting of this note might be different from the original. Overvie  . Anemia   . Annual physical exam 01/25/2020  . Anxiety 01/18/2018  . Atrial flutter, paroxysmal ([place])   . BENIGN PROSTATIC HYPERTROPHY 11/09/2008   Qualifier: Diagnosis of  By: Linna Darner MD, Gwyndolyn Saxon   No PMH of elevated PSA ; no biopsy    . Blood donor 01/03/2014     He he donates blood twice a year as "double red"   . BPH (benign prostatic hyperplasia)   . Chest pain   . 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 Diverticula of colon   . Diverticulosis of colon 11/09/2008  Qualifier: Diagnosis of  By: Linna Darner MD, Susann Givens of this note might be different from the original. Overview:  Qualifier: Diagnosis of  By: Linna Darner MD, Susann Givens of this note might be different from the original. Overview:  Qualifier: Diagnosis of  By: Linna Darner MD, [address] 10/01 Updates  . Enlarged prostate without lower urinary tract symptoms (luts) 11/09/2008   Formatting of this note might be different from the original. Overview:   Qualifier: Diagnosis of  By: Linna Darner MD, Gwyndolyn Saxon   No PMH of elevated PSA ; no biopsy Overview:  Overview:  Qualifier: Diagnosis of  By: Linna Darner MD, Gwyndolyn Saxon   No PMH of elevated PSA ; no biopsy Overview:  Overview:  Overview:  Qualifier: Diagnosis of  By: Linna Darner MD, William   No PMH of elevated PSA ; no biopsy  . Essential (primary) hypertension 09/21/2007   Qualifier: Diagnosis of  By: Linna Darner MD, Susann Givens of this note might be different from the original. Overview:  Qualifier: Diagnosis of  By: Linna Darner MD, [place]:  Blood pressure appears to be well controlled. Renal function will be checked.  He has a minor cough; this does not appear to be significant enough to warrant change to losartan Overview:  Overview:  Qu  . Family history of type C viral hepatitis 06/30/2012   Brother had liver transplant   . Hepatitis A 04/2019  . History of colonic polyps 12/23/2006   Qualifier: Diagnosis of  By: Linna Darner MD, Gwyndolyn Saxon   Dr Earlie Raveling, GI Polypectomy X2 , 4/16 /2014. One was  transitional cell adenoma Due annually No FH colon cancer   Formatting of this note might be different from the original. Overview:  Qualifier: Diagnosis of  By: Linna Darner MD, Gwyndolyn Saxon   Dr Earlie Raveling, GI Polypectomy X2 , 4/16 /2014. One was  transitional cell adenoma Due annually No FH colon ca  . History of urinary stone 09/21/2007   Qualifier: Diagnosis of  By: Linna Darner MD, Jessie Foot , passed spontaneously No Urologist involvement   Formatting of this note might be different from the original. Overview:  Qualifier: Diagnosis of  By: Linna Darner MD, Jessie Foot , passed spontaneously No Urologist involvement Overview:  Overview:  Qualifier: Diagnosis of  By: Linna Darner MD, Jessie Foot , passed spontaneously No Urologist involvement   . HYPERLIPIDEMIA 09/21/2007   Qualifier: Diagnosis of  By: Linna Darner MD, Cristopher Estimable Lipoprofile 2006 : LDL  108 ( 1072  / 575  ),  HDL 63 , Triglycerides  40. LDL goal = < 135 ; ideally <  105. No FH CAD   Formatting of this note might be different from the original. Overview:  Qualifier: Diagnosis of  By: Linna Darner MD, Cristopher Estimable Lipoprofile 2006 : LDL  108 ( 1072  / 575  ),  HDL 63 , Triglycerides  40. LDL goal = < 135 ; idea  . Hypertension   . Joint pain   . Liver injury 04/2019  . Liver injury, initial encounter 05/17/2019  . Macular degeneration 01/06/2018  . Malignant neoplasm of skin   . Mallet finger 2012     4th L DIP,Dr Sypher  . Mixed dyslipidemia 03/23/2020  . Nephrolithiasis   . Nocturnal hypoxia 11/04/2019  . Nuclear sclerotic cataract of both eyes   . Obesity (BMI 30-39.9) 01/04/2014  . OSA (obstructive sleep apnea) 05/17/2020  .  Osteoarthritis   . Overweight 03/23/2020  . Pain in left foot 06/28/2020  . PCP NOTES >>>>>>>>>>>>>>>> 05/26/2019  . Personal history of other malignant neoplasm of skin 06/30/2012   Dr Wilhemina Bonito, Derm X3 Local resection from  right forearm and forehead.   Mohs surgery of the nose 06/2012, Dr Verlon Au of this note might be different from the original. Overview:  Dr Wilhemina Bonito, Derm X3 Local resection from  right forearm and forehead.   Mohs surgery of the nose 06/2012, Dr Sarajane Jews Overview:  Overview:  Dr Wilhemina Bonito, Derm X3 Local resection from  right forearm and fo  . RLS (restless legs syndrome)   . Sleep apnea   . Snoring 02/01/2014   Formatting of this note might be different from the original. Last Assessment & Plan:  The patient has been noted to have significant snoring, but no one has ever really commented on an abnormal breathing pattern during sleep. Although he is overweight and does have restless sleep, the rest of his history is not overly impressive for sleep disordered breathing. If he does have sleep apnea, I suspe    Tobacco History: Social History   Tobacco Use  Smoking Status Former Smoker  . Packs/day: 2.00  . Years: 26.00  . Pack years: 52.00  . Types: Cigarettes  . Quit date: 07/29/1982  . Years since  quitting: 38.2  Smokeless Tobacco Never Used  Tobacco Comment   ages [age], up to 2 ppd   Counseling given: Not Answered Comment: ages [age], up to 2 ppd   Outpatient Medications Prior to Visit  Medication Sig Dispense Refill  . atorvastatin (LIPITOR) 10 MG tablet Take 1 tablet (10 mg total) by mouth at bedtime. 90 tablet 3  . cetirizine (ZYRTEC) 10 MG tablet Take 10 mg by mouth daily.    . clonazePAM (KLONOPIN) 0.5 MG tablet TAKE 1 TABLET BY MOUTH EVERY NIGHT AT BEDTIME AS NEEDED FOR RESTLESS LEG 90 tablet 0  . ELIQUIS 5 MG TABS tablet TAKE 1 TABLET BY MOUTH TWICE A DAY 180 tablet 1  . hydrochlorothiazide (HYDRODIURIL) 12.5 MG tablet Take 1 tablet (12.5 mg total) by mouth daily. 90 tablet 1  . losartan (COZAAR) 50 MG tablet Take 1 tablet (50 mg total) by mouth daily. 90 tablet 1  . metoprolol tartrate (LOPRESSOR) 50 MG tablet Take 1 tablet (50 mg total) by mouth as needed (for a fast heartrate). Take 50 mg Lopressor as needed for a fast heart rate. 10 tablet 1  . Multiple Vitamin (MULTIVITAMIN WITH MINERALS) TABS tablet Take 1 tablet by mouth daily.    . Multiple Vitamins-Minerals (ICAPS AREDS 2 PO) Take by mouth.    . Vitamin D, Ergocalciferol, (DRISDOL) 1.25 MG (50000 UNIT) CAPS capsule Take 1 capsule (50,000 Units total) by mouth every 7 (seven) days. 4 capsule 0   No facility-administered medications prior to visit.   Review of Systems  Review of Systems  Constitutional: Negative.   HENT: Positive for postnasal drip.   Respiratory: Negative.   Psychiatric/Behavioral: Negative.    Physical Exam  BP 108/62 (BP Location: Left Arm, Patient Position: Sitting, Cuff Size: Normal)   Pulse (!) 58   Temp (!) 97 F (36.1 C) (Skin)   Resp 20   Ht 5\' 9"  (1.753 m)   Wt 224 lb 3.2 oz (101.7 kg)   SpO2 97%   BMI 33.11 kg/m  Physical Exam Constitutional:      Appearance: Normal appearance.  HENT:  Mouth/Throat:     Mouth: Mucous membranes are moist.     Pharynx: Oropharynx is  clear.  Cardiovascular:     Rate and Rhythm: Normal rate and regular rhythm.     Comments: RRR Pulmonary:     Effort: Pulmonary effort is normal.     Breath sounds: Normal breath sounds.  Musculoskeletal:        General: Normal range of motion.  Skin:    General: Skin is warm and dry.  Neurological:     General: No focal deficit present.     Mental Status: He is alert and oriented to person, place, and time. Mental status is at baseline.  Psychiatric:        Mood and Affect: Mood normal.        Behavior: Behavior normal.        Thought Content: Thought content normal.        Judgment: Judgment normal.      Lab Results:  CBC    Component Value Date/Time   WBC 6.5 08/28/2020 0837   RBC 4.85 08/28/2020 0837   HGB 14.5 08/28/2020 0837   HCT 43.8 08/28/2020 0837   PLT 204.0 08/28/2020 0837   MCV 90.3 08/28/2020 0837   MCH 30.5 03/04/2020 2131   MCHC 33.2 08/28/2020 0837   RDW 13.6 08/28/2020 0837   LYMPHSABS 1.4 08/28/2020 0837   MONOABS 0.8 08/28/2020 0837   EOSABS 0.3 08/28/2020 0837   BASOSABS 0.0 08/28/2020 0837    BMET    Component Value Date/Time   NA 141 09/17/2020 1400   K 3.7 09/17/2020 1400   CL 102 09/17/2020 1400   CO2 23 09/17/2020 1400   GLUCOSE 86 09/17/2020 1400   GLUCOSE 91 08/28/2020 0837   BUN 11 09/17/2020 1400   CREATININE 0.89 09/17/2020 1400   CALCIUM 9.4 09/17/2020 1400   GFRNONAA 82 09/17/2020 1400   GFRAA 95 09/17/2020 1400    BNP No results found for: BNP  ProBNP No results found for: PROBNP  Imaging: No results found.   Assessment & Plan:   OSA (obstructive sleep apnea) - HST 04/03/20 showed severe obstructive sleep apnea, AHI 57/hr.  - CPAP titration on 05/17/20 recommended pressure 9 cm H20, no oxygen requirements - He reports compliance with CPAP machine, no data on SD card and airview unavailable. No issues with mask fit or pressure setting - Instructed patient to bring CPAP machine to [facility] for download  - Advised  patient to continue to wear CPAP every night for min 4-6 hours  - Continue to work on weight loss efforts  - FU in 6 months    Allergic rhinitis - Recommend patient use ocean nasal spray twice daily  Martyn Ehrich, NP 10/11/2020

## 2020-10-11 ENCOUNTER — Encounter: Payer: Self-pay | Admitting: Primary Care

## 2020-10-11 ENCOUNTER — Other Ambulatory Visit: Payer: Self-pay

## 2020-10-11 ENCOUNTER — Ambulatory Visit (INDEPENDENT_AMBULATORY_CARE_PROVIDER_SITE_OTHER): Payer: Medicare Other | Admitting: Primary Care

## 2020-10-11 ENCOUNTER — Ambulatory Visit: Payer: Medicare Other | Admitting: Pulmonary Disease

## 2020-10-11 DIAGNOSIS — J309 Allergic rhinitis, unspecified: Secondary | ICD-10-CM

## 2020-10-11 DIAGNOSIS — G4733 Obstructive sleep apnea (adult) (pediatric): Secondary | ICD-10-CM

## 2020-10-11 NOTE — Assessment & Plan Note (Signed)
-   HST 04/03/20 showed severe obstructive sleep apnea, AHI 57/hr.  - CPAP titration on 05/17/20 recommended pressure 9 cm H20, no oxygen requirements - He reports compliance with CPAP machine, no data on SD card and airview unavailable. No issues with mask fit or pressure setting - Instructed patient to bring CPAP machine to [facility] for download  - Advised patient to continue to wear CPAP every night for min 4-6 hours  - Continue to work on weight loss efforts  - FU in 6 months

## 2020-10-11 NOTE — Assessment & Plan Note (Signed)
-   Recommend patient use ocean nasal spray twice daily

## 2020-10-11 NOTE — Patient Instructions (Addendum)
Recommendation: Please bring CPAP machine into [facility] at your conveience so they can download a report and send to [place] to review Continue to wear CPAP every night for 4-6 hours or longer  Do not drive if experiencing excessive daytime fatigue  Continue weight loss effort  Try Ocean nasal spray twice daily  Follow-up: 6 months with Gregory Wilkins   CPAP and BPAP Information CPAP and BPAP are methods that use air pressure to keep your airways open and to help you breathe well. CPAP and BPAP use different amounts of pressure. Your health care provider will tell you whether CPAP or BPAP would be more helpful for you.  CPAP stands for "continuous positive airway pressure." With CPAP, the amount of pressure stays the same while you breathe in and out.  BPAP stands for "bi-level positive airway pressure." With BPAP, the amount of pressure will be higher when you breathe in (inhale) and lower when you breathe out(exhale). This allows you to take larger breaths. CPAP or BPAP may be used in the hospital, or your health care provider may want you to use it at home. You may need to have a sleep study before your health care provider can order a machine for you to use at home. Why are CPAP and BPAP treatments used? CPAP or BPAP can be helpful if you have:  Sleep apnea.  Chronic obstructive pulmonary disease (COPD).  Heart failure.  Medical conditions that cause muscle weakness, including muscular dystrophy or amyotrophic lateral sclerosis (ALS).  Other problems that cause breathing to be shallow, weak, abnormal, or difficult. CPAP and BPAP are most commonly used for obstructive sleep apnea (OSA) to keep the airways from collapsing when the muscles relax during sleep. How is CPAP or BPAP administered? Both CPAP and BPAP are provided by a small machine with a flexible plastic tube that attaches to a plastic mask that you wear. Air is blown through the mask into your nose or mouth. The amount of pressure that  is used to blow the air can be adjusted on the machine. Your health care provider will set the pressure setting and help you find the best mask for you. When should CPAP or BPAP be used? In most cases, the mask only needs to be worn during sleep. Generally, the mask needs to be worn throughout the night and during any daytime naps. People with certain medical conditions may also need to wear the mask at other times when they are awake. Follow instructions from your health care provider about when to use the machine. What are some tips for using the mask?  Because the mask needs to be snug, some people feel trapped or closed-in (claustrophobic) when first using the mask. If you feel this way, you may need to get used to the mask. One way to do this is to hold the mask loosely over your nose or mouth and then gradually apply the mask more snugly. You can also gradually increase the amount of time that you use the mask.  Masks are available in various types and sizes. If your mask does not fit well, talk with your health care provider about getting a different one. Some common types of masks include: ? Full face masks, which fit over the mouth and nose. ? Nasal masks, which fit over the nose. ? Nasal pillow or prong masks, which fit into the nostrils.  If you are using a mask that fits over your nose and you tend to breathe through your  mouth, a chin strap may be applied to help keep your mouth closed.  Some CPAP and BPAP machines have alarms that may sound if the mask comes off or develops a leak.  If you have trouble with the mask, it is very important that you talk with your health care provider about finding a way to make the mask easier to tolerate. Do not stop using the mask. There could be a negative impact to your health if you stop using the mask.   What are some tips for using the machine?  Place your CPAP or BPAP machine on a secure table or stand near an electrical outlet.  Know where  the on/off switch is on the machine.  Follow instructions from your health care provider about how to set the pressure on your machine and when you should use it.  Do not eat or drink while the CPAP or BPAP machine is on. Food or fluids could get pushed into your lungs by the pressure of the CPAP or BPAP.  For home use, CPAP and BPAP machines can be rented or purchased through home health care companies. Many different brands of machines are available. Renting a machine before purchasing may help you find out which particular machine works well for you. Your insurance may also decide which machine you may get.  Keep the CPAP or BPAP machine and attachments clean. Ask your health care provider for specific instructions. Follow these instructions at home:  Do not use any products that contain nicotine or tobacco, such as cigarettes, e-cigarettes, and chewing tobacco. If you need help quitting, ask your health care provider.  Keep all follow-up visits as told by your health care provider. This is important. Contact a health care provider if:  You have redness or pressure sores on your head, face, mouth, or nose from the mask or head gear.  You have trouble using the CPAP or BPAP machine.  You cannot tolerate wearing the CPAP or BPAP mask.  Someone tells you that you snore even when wearing your CPAP or BPAP. Get help right away if:  You have trouble breathing.  You feel confused. Summary  CPAP and BPAP are methods that use air pressure to keep your airways open and to help you breathe well.  You may need to have a sleep study before your health care provider can order a machine for home use.  If you have trouble with the mask, it is very important that you talk with your health care provider about finding a way to make the mask easier to tolerate. Do not stop using the mask. There could be a negative impact to your health if you stop using the mask.  Follow instructions from your  health care provider about when to use the machine. This information is not intended to replace advice given to you by your health care provider. Make sure you discuss any questions you have with your health care provider. Document Revised: 08/05/2019 Document Reviewed: 08/08/2019 Elsevier Patient Education  2021 [employer].

## 2020-10-12 ENCOUNTER — Encounter: Payer: Self-pay | Admitting: Cardiology

## 2020-10-12 ENCOUNTER — Ambulatory Visit (INDEPENDENT_AMBULATORY_CARE_PROVIDER_SITE_OTHER): Payer: Medicare Other | Admitting: Cardiology

## 2020-10-12 VITALS — BP 106/58 | HR 58 | Ht 69.0 in | Wt 224.0 lb

## 2020-10-12 DIAGNOSIS — I4892 Unspecified atrial flutter: Secondary | ICD-10-CM

## 2020-10-12 DIAGNOSIS — I10 Essential (primary) hypertension: Secondary | ICD-10-CM

## 2020-10-12 DIAGNOSIS — I2584 Coronary atherosclerosis due to calcified coronary lesion: Secondary | ICD-10-CM

## 2020-10-12 DIAGNOSIS — I77810 Thoracic aortic ectasia: Secondary | ICD-10-CM

## 2020-10-12 DIAGNOSIS — E782 Mixed hyperlipidemia: Secondary | ICD-10-CM

## 2020-10-12 DIAGNOSIS — I2510 Atherosclerotic heart disease of native coronary artery without angina pectoris: Secondary | ICD-10-CM

## 2020-10-12 HISTORY — DX: Thoracic aortic ectasia: I77.810

## 2020-10-12 HISTORY — DX: Atherosclerotic heart disease of native coronary artery without angina pectoris: I25.10

## 2020-10-12 MED ORDER — LOSARTAN POTASSIUM 25 MG PO TABS: 25.0000 mg | ORAL_TABLET | Freq: Every day | ORAL | 1 refills | Status: DC

## 2020-10-12 MED ORDER — ASPIRIN EC 81 MG PO TBEC: 81.0000 mg | DELAYED_RELEASE_TABLET | Freq: Every day | ORAL | 3 refills | Status: AC

## 2020-10-12 NOTE — Progress Notes (Signed)
Cardiology Office Note:    Date:  10/12/2020   ID:  Gregory Wilkins, DOB August 04, 1940, MRN [id number]  PCP:  Colon Branch, MD  Cardiologist:  Jenean Lindau, MD   Referring MD: Colon Branch, MD    ASSESSMENT:    1. Essential (primary) hypertension   2. Ascending aorta dilatation (HCC)   3. Atrial flutter, paroxysmal ([place])   4. Mixed dyslipidemia   5. Coronary artery calcification    PLAN:    In order of problems listed above:  1. Coronary artery calcification: Secondary prevention stressed with the patient.  Importance of compliance with diet medication stressed any vocalized understanding.  He has a good exercise program.  He was advised to walk 30 minutes a day 5 days a week and he promises to do so.  He was advised to take a coated baby aspirin on a daily basis. 2. Essential hypertension: Blood pressure is borderline so I cut back losartan to 25 mg daily from 50 mg daily. 3. Ascending aortic dilatation: Stable at this time.  We will continue to monitor.  I discussed findings of CT scan with him at length and echocardiogram also. 4. Mixed dyslipidemia: Diet was emphasized.  Lipids reviewed and he promises to do better and walk and lose weight. 5. Obesity: Weight reduction was stressed and he is going to do better with this. 6. Patient will be seen in follow-up appointment in 6 months or earlier if the patient has any concerns    Medication Adjustments/Labs and Tests Ordered: Current medicines are reviewed at length with the patient today.  Concerns regarding medicines are outlined above.  No orders of the defined types were placed in this encounter.  Meds ordered this encounter  Medications  . losartan (COZAAR) 25 MG tablet    Sig: Take 1 tablet (25 mg total) by mouth daily.    Dispense:  90 tablet    Refill:  1  . aspirin EC 81 MG tablet    Sig: Take 1 tablet (81 mg total) by mouth daily. Swallow whole.    Dispense:  90 tablet    Refill:  3     No chief complaint  on file.    History of Present Illness:    Gregory Wilkins is a 80 y.o. male.  Patient has past medical history of coronary artery calcification, dilated ascending aorta, essential hypertension and dyslipidemia.  He denies any problems at this time and takes care of activities of daily living.  No chest pain orthopnea or PND.  He walks at least 20 to 30 minutes on a regular basis without any symptoms.  At the time of my evaluation, the patient is alert awake oriented and in no distress.  Past Medical History:  Diagnosis Date  . Acne rosacea 12/23/2006   Qualifier: Diagnosis of  By: Linna Darner MD, Gwyndolyn Saxon   Dr Crista Luria   Formatting of this note might be different from the original. Overview:  Qualifier: Diagnosis of  By: Linna Darner MD, Gwyndolyn Saxon   Dr Crista Luria Overview:  Overview:  Qualifier: Diagnosis of  By: Linna Darner MD, Gwyndolyn Saxon   Dr Crista Luria  Overview:  Qualifier: Diagnosis of  By: Linna Darner MD, Gwyndolyn Saxon   Dr Crista Luria Overview:  Overview:  Overview:  . Alcohol abuse   . Allergic rhinitis   . Allergic rhinitis 12/23/2006   Qualifier: Diagnosis of  By: Linna Darner MD, Gwyndolyn Saxon   Onset:in high school Character: perennial but increased seasonally Triggers :cat,  dust, pollen Allergy testing: Dr Bernita Buffy Maintenance medications/ response:Flonase/ Nasonex; saline wash; Allegra Smoking history:age 45- 34, up to 2 ppd Family history pulmonary disease: no    Formatting of this note might be different from the original. Overvie  . Anemia   . Annual physical exam 01/25/2020  . Anxiety 01/18/2018  . Atrial flutter, paroxysmal ([place])   . BENIGN PROSTATIC HYPERTROPHY 11/09/2008   Qualifier: Diagnosis of  By: Linna Darner MD, Gwyndolyn Saxon   No PMH of elevated PSA ; no biopsy    . Blood donor 01/03/2014     He he donates blood twice a year as "double red"   . BPH (benign prostatic hyperplasia)   . Chest pain   . 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 Diverticula of colon   . Diverticulosis of colon 11/09/2008    Qualifier: Diagnosis of  By: Linna Darner MD, Susann Givens of this note might be different from the original. Overview:  Qualifier: Diagnosis of  By: Linna Darner MD, Susann Givens of this note might be different from the original. Overview:  Qualifier: Diagnosis of  By: Linna Darner MD, [address] [place] 10/01 Updates  . Enlarged prostate without lower urinary tract symptoms (luts) 11/09/2008   Formatting of this note might be different from the original. Overview:  Qualifier: Diagnosis of  By: Linna Darner MD, Gwyndolyn Saxon   No PMH of elevated PSA ; no biopsy Overview:  Overview:  Qualifier: Diagnosis of  By: Linna Darner MD, Gwyndolyn Saxon   No PMH of elevated PSA ; no biopsy Overview:  Overview:  Overview:  Qualifier: Diagnosis of  By: Linna Darner MD, William   No PMH of elevated PSA ; no biopsy  . Essential (primary) hypertension 09/21/2007   Qualifier: Diagnosis of  By: Linna Darner MD, Susann Givens of this note might be different from the original. Overview:  Qualifier: Diagnosis of  By: Linna Darner MD, [place]:  Blood pressure appears to be well controlled. Renal function will be checked.  He has a minor cough; this does not appear to be significant enough to warrant change to losartan Overview:  Overview:  Qu  . Family history of type C viral hepatitis 06/30/2012   Brother had liver transplant   . Hepatitis A 04/2019  . History of colonic polyps 12/23/2006   Qualifier: Diagnosis of  By: Linna Darner MD, Gwyndolyn Saxon   Dr Earlie Raveling, GI Polypectomy X2 , 4/16 /2014. One was  transitional cell adenoma Due annually No FH colon cancer   Formatting of this note might be different from the original. Overview:  Qualifier: Diagnosis of  By: Linna Darner MD, Gwyndolyn Saxon   Dr Earlie Raveling, GI Polypectomy X2 , 4/16 /2014. One was  transitional cell adenoma Due annually No FH colon ca  . History of urinary stone 09/21/2007   Qualifier: Diagnosis of  By: Linna Darner MD, Jessie Foot , passed spontaneously No Urologist involvement   Formatting of this  note might be different from the original. Overview:  Qualifier: Diagnosis of  By: Linna Darner MD, Jessie Foot , passed spontaneously No Urologist involvement Overview:  Overview:  Qualifier: Diagnosis of  By: Linna Darner MD, Jessie Foot , passed spontaneously No Urologist involvement   . HYPERLIPIDEMIA 09/21/2007   Qualifier: Diagnosis of  By: Linna Darner MD, Cristopher Estimable Lipoprofile 2006 : LDL  108 ( 1072  / 575  ),  HDL 63 , Triglycerides  40. LDL goal = <  135 ; ideally < 105. No FH CAD   Formatting of this note might be different from the original. Overview:  Qualifier: Diagnosis of  By: Linna Darner MD, Cristopher Estimable Lipoprofile 2006 : LDL  108 ( 1072  / 575  ),  HDL 63 , Triglycerides  40. LDL goal = < 135 ; idea  . Hypertension   . Joint pain   . Liver injury 04/2019  . Liver injury, initial encounter 05/17/2019  . Macular degeneration 01/06/2018  . Malignant neoplasm of skin   . Mallet finger 2012     4th L DIP,Dr Sypher  . Mixed dyslipidemia 03/23/2020  . Nephrolithiasis   . Nocturnal hypoxia 11/04/2019  . Nuclear sclerotic cataract of both eyes   . Obesity (BMI 30-39.9) 01/04/2014  . OSA (obstructive sleep apnea) 05/17/2020  . Osteoarthritis   . Overweight 03/23/2020  . Pain in left foot 06/28/2020  . PCP NOTES >>>>>>>>>>>>>>>> 05/26/2019  . Personal history of other malignant neoplasm of skin 06/30/2012   Dr Wilhemina Bonito, Derm X3 Local resection from  right forearm and forehead.   Mohs surgery of the nose 06/2012, Dr Verlon Au of this note might be different from the original. Overview:  Dr Wilhemina Bonito, Derm X3 Local resection from  right forearm and forehead.   Mohs surgery of the nose 06/2012, Dr Sarajane Jews Overview:  Overview:  Dr Wilhemina Bonito, Derm X3 Local resection from  right forearm and fo  . RLS (restless legs syndrome)   . Sleep apnea   . Snoring 02/01/2014   Formatting of this note might be different from the original. Last Assessment & Plan:  The patient has been noted to have significant  snoring, but no one has ever really commented on an abnormal breathing pattern during sleep. Although he is overweight and does have restless sleep, the rest of his history is not overly impressive for sleep disordered breathing. If he does have sleep apnea, I suspe    Past Surgical History:  Procedure Laterality Date  . basal cell cancer     X 3  . INGUINAL HERNIA REPAIR  [age]   right,left  . OPEN REDUCTION INTERNAL FIXATION (ORIF) METACARPAL Right 05/19/2013   Procedure: OPEN REDUCTION INTERNAL FIXATION  RIGHT SMALL  METACARPAL FRACTURE;  Surgeon: Cammie Sickle., MD;  Location: [facility];  Service: Orthopedics;  Laterality: Right;  . POLYPECTOMY     X 3; Dr Earlean Shawl  . WISDOM TOOTH EXTRACTION      Current Medications: Current Meds  Medication Sig  . apixaban (ELIQUIS) 5 MG TABS tablet Take 5 mg by mouth 2 (two) times daily.  Marland Kitchen aspirin EC 81 MG tablet Take 1 tablet (81 mg total) by mouth daily. Swallow whole.  Marland Kitchen atorvastatin (LIPITOR) 10 MG tablet Take 1 tablet (10 mg total) by mouth at bedtime.  . cetirizine (ZYRTEC) 10 MG tablet Take 10 mg by mouth daily.  . clonazePAM (KLONOPIN) 0.5 MG tablet Take 0.5 mg by mouth at bedtime as needed (Restless Leg).  . hydrochlorothiazide (HYDRODIURIL) 12.5 MG tablet Take 1 tablet (12.5 mg total) by mouth daily.  . metoprolol tartrate (LOPRESSOR) 50 MG tablet Take 50 mg by mouth as needed (fast heart rate).  . Multiple Vitamin (MULTIVITAMIN WITH MINERALS) TABS tablet Take 1 tablet by mouth daily.  . Multiple Vitamins-Minerals (ICAPS AREDS 2 PO) Take 1 tablet by mouth daily in the afternoon.  . Vitamin D, Ergocalciferol, (DRISDOL) 1.25 MG (50000 UNIT) CAPS  capsule Take 1 capsule (50,000 Units total) by mouth every 7 (seven) days.  . [DISCONTINUED] losartan (COZAAR) 50 MG tablet Take 1 tablet (50 mg total) by mouth daily.     Allergies:   Penicillins and Ramipril   Social History   Socioeconomic History  . Marital status:  Married    Spouse name: Butch Penny   . Number of children: Not on file  . Years of education: Not on file  . Highest education level: Not on file  Occupational History  . Occupation: retired---Financial Analyst  Tobacco Use  . Smoking status: Former Smoker    Packs/day: 2.00    Years: 26.00    Pack years: 52.00    Types: Cigarettes    Quit date: 07/29/1982    Years since quitting: 38.2  . Smokeless tobacco: Never Used  . Tobacco comment: ages [age], up to 2 ppd  Vaping Use  . Vaping Use: Never used  Substance and Sexual Activity  . Alcohol use: No    Alcohol/week: 0.0 standard drinks  . Drug use: No  . Sexual activity: Not on file  Other Topics Concern  . Not on file  Social History Narrative   Household: pt, wife, child   Social Determinants of Radio broadcast assistant Strain: Not on file  Food Insecurity: Not on file  Transportation Needs: Not on file  Physical Activity: Not on file  Stress: Not on file  Social Connections: Not on file     Family History: The patient's family history includes Alcohol abuse in his mother; Diabetes in his father; Hypertension in his mother; Kidney disease in his father; Obesity in his mother; Stroke (age of onset: 95) in his mother. There is no history of Heart disease or Cancer.  ROS:   Please see the history of present illness.    All other systems reviewed and are negative.  EKGs/Labs/Other Studies Reviewed:    The following studies were reviewed today: IMPRESSION: 1. The tubular ascending thoracic aorta is normal in caliber measuring up to 3.7 x 3.7 cm. Limited assessment of the aortic root on this noncontrast, nongated examination. Aortic Atherosclerosis (ICD10-I70.0). 2. Coronary artery disease. 3. Minimal emphysema.  Emphysema (ICD10-J43.9). 4. Coarse contour of the liver in the included upper abdomen, suggestive of cirrhosis.   Electronically Signed   By: Eddie Candle M.D.   On: 04/04/2020 15:09   IMPRESSIONS     1. Left ventricular ejection fraction, by estimation, is 55 to 60%. The  left ventricle has normal function. The left ventricle has no regional  wall motion abnormalities. Left ventricular diastolic parameters were  normal.  2. Right ventricular systolic function is normal. The right ventricular  size is normal.  3. The mitral valve is normal in structure. No evidence of mitral valve  regurgitation. No evidence of mitral stenosis.  4. The aortic valve was not well visualized. Aortic valve regurgitation  is trivial. Mild to moderate aortic valve sclerosis/calcification is  present, without any evidence of aortic stenosis.  5. Aortic dilatation noted. There is mild dilatation of the aortic root  measuring 39 mm.  6. The inferior vena cava is normal in size with greater than 50%  respiratory variability, suggesting right atrial pressure of 3 mmHg.    Recent Labs: 03/04/2020: Magnesium 2.1 08/28/2020: Hemoglobin 14.5; Platelets 204.0 09/17/2020: ALT 17; BUN 11; Creatinine, Ser 0.89; Potassium 3.7; Sodium 141; TSH 1.600  Recent Lipid Panel    Component Value Date/Time   CHOL 148 09/17/2020 1400  TRIG 44 09/17/2020 1400   HDL 74 09/17/2020 1400   CHOLHDL 2 03/12/2020 0744   VLDL 9.6 03/12/2020 0744   LDLCALC 64 09/17/2020 1400   LDLDIRECT 124.6 11/10/2008 0000    Physical Exam:    VS:  BP (!) 106/58   Pulse (!) 58   Ht 5\' 9"  (1.753 m)   Wt 224 lb (101.6 kg)   SpO2 97%   BMI 33.08 kg/m     Wt Readings from Last 3 Encounters:  10/12/20 224 lb (101.6 kg)  10/11/20 224 lb 3.2 oz (101.7 kg)  10/01/20 223 lb (101.2 kg)     GEN: Patient is in no acute distress HEENT: Normal NECK: No JVD; No carotid bruits LYMPHATICS: No lymphadenopathy CARDIAC: Hear sounds regular, 2/6 systolic murmur at the apex. RESPIRATORY:  Clear to auscultation without rales, wheezing or rhonchi  ABDOMEN: Soft, non-tender, non-distended MUSCULOSKELETAL:  No edema; No deformity  SKIN: Warm and  dry NEUROLOGIC:  Alert and oriented x 3 PSYCHIATRIC:  Normal affect   Signed, Jenean Lindau, MD  10/12/2020 10:57 AM    [place]

## 2020-10-12 NOTE — Patient Instructions (Signed)
Medication Instructions:  Your physician has recommended you make the following change in your medication:   DECREASE: Losartan to 25 mg daily   START: aspirin 81 mg daily  *If you need a refill on your cardiac medications before your next appointment, please call your pharmacy*   Lab Work: None If you have labs (blood work) drawn today and your tests are completely normal, you will receive your results only by: Marland Kitchen MyChart Message (if you have MyChart) OR . A paper copy in the mail If you have any lab test that is abnormal or we need to change your treatment, we will call you to review the results.   Testing/Procedures: None   Follow-Up: At [facility], you and your health needs are our priority.  As part of our continuing mission to provide you with exceptional heart care, we have created designated Provider Care Teams.  These Care Teams include your primary Cardiologist (physician) and Advanced Practice Providers (APPs -  Physician Assistants and Nurse Practitioners) who all work together to provide you with the care you need, when you need it.  We recommend signing up for the patient portal called "MyChart".  Sign up information is provided on this After Visit Summary.  MyChart is used to connect with patients for Virtual Visits (Telemedicine).  Patients are able to view lab/test results, encounter notes, upcoming appointments, etc.  Non-urgent messages can be sent to your provider as well.   To learn more about what you can do with MyChart, go to [email or web address].    Your next appointment:   6 month(s)  The format for your next appointment:   In Person  Provider:   Jyl Heinz, MD   Other Instructions

## 2020-10-15 ENCOUNTER — Other Ambulatory Visit: Payer: Self-pay

## 2020-10-15 ENCOUNTER — Ambulatory Visit (INDEPENDENT_AMBULATORY_CARE_PROVIDER_SITE_OTHER): Payer: Medicare Other | Admitting: Physician Assistant

## 2020-10-15 ENCOUNTER — Encounter (INDEPENDENT_AMBULATORY_CARE_PROVIDER_SITE_OTHER): Payer: Self-pay | Admitting: Physician Assistant

## 2020-10-15 VITALS — BP 104/63 | HR 60 | Temp 98.1°F | Ht 69.0 in | Wt 216.0 lb

## 2020-10-15 DIAGNOSIS — Z6833 Body mass index (BMI) 33.0-33.9, adult: Secondary | ICD-10-CM | POA: Diagnosis not present

## 2020-10-15 DIAGNOSIS — E559 Vitamin D deficiency, unspecified: Secondary | ICD-10-CM | POA: Diagnosis not present

## 2020-10-15 DIAGNOSIS — E669 Obesity, unspecified: Secondary | ICD-10-CM

## 2020-10-15 DIAGNOSIS — I10 Essential (primary) hypertension: Secondary | ICD-10-CM | POA: Diagnosis not present

## 2020-10-15 MED ORDER — VITAMIN D (ERGOCALCIFEROL) 1.25 MG (50000 UNIT) PO CAPS: 50000.0000 [IU] | ORAL_CAPSULE | ORAL | 0 refills | Status: DC

## 2020-10-18 NOTE — Progress Notes (Signed)
Chief Complaint:   OBESITY Gregory Wilkins is here to discuss his progress with his obesity treatment plan along with follow-up of his obesity related diagnoses. Gregory Wilkins is on the Category 3 Plan and states he is following his eating plan approximately 100% of the time. Gregory Wilkins states he is doing yoga, walking, and riding the bike for 60+ minutes 7 times per week.  Today's visit was #: 3 Starting weight: 230 lbs Starting date: 09/17/2020 Today's weight: 216 lbs Today's date: 10/15/2020 Total lbs lost to date: 14 Total lbs lost since last in-office visit: 7  Interim History: Gregory Wilkins did very well with weight loss. He is challenged with eating all of the food on the plan, but he is doing it everyday and journaling it in MyFitness Pal. He is not always eating his snack calories.  Subjective:   1. Vitamin D deficiency Gregory Wilkins is on Vit D, and he denies nausea, vomiting, or muscle weakness.  2. Essential hypertension Gregory Wilkins is managed by Cardiology. He had a follow up with his Cardiologist last week. Weight loss was encouraged. His blood pressure is controlled.   Assessment/Plan:   1. Vitamin D deficiency Low Vitamin D level contributes to fatigue and are associated with obesity, breast, and colon cancer. We will refill prescription Vitamin D for 1 month. Gregory Wilkins will follow-up for routine testing of Vitamin D, at least 2-3 times per year to avoid over-replacement.  - Vitamin D, Ergocalciferol, (DRISDOL) 1.25 MG (50000 UNIT) CAPS capsule; Take 1 capsule (50,000 Units total) by mouth every 7 (seven) days.  Dispense: 4 capsule; Refill: 0  2. Essential hypertension Gregory Wilkins will follow up with Cardiology and continue his medications. He will continue working on healthy weight loss and exercise to improve blood pressure control. We will watch for signs of hypotension as he continues his lifestyle modifications.  3. Class 1 obesity with serious comorbidity and body mass index (BMI) of 33.0 to 33.9 in adult, unspecified  obesity type Gregory Wilkins is currently in the action stage of change. As such, his goal is to continue with weight loss efforts. He has agreed to the Category 3 Plan.   Exercise goals: As is.  Behavioral modification strategies: meal planning and cooking strategies and planning for success.  Gregory Wilkins has agreed to follow-up with our clinic in 2 weeks. He was informed of the importance of frequent follow-up visits to maximize his success with intensive lifestyle modifications for his multiple health conditions.   Objective:   Blood pressure 104/63, pulse 60, temperature 98.1 Gregory (36.7 C), height 5\' 9"  (1.753 m), weight 216 lb (98 kg), SpO2 100 %. Body mass index is 31.9 kg/m.  General: Cooperative, alert, well developed, in no acute distress. HEENT: Conjunctivae and lids unremarkable. Cardiovascular: Regular rhythm.  Lungs: Normal work of breathing. Neurologic: No focal deficits.   Lab Results  Component Value Date   CREATININE 0.89 09/17/2020   BUN 11 09/17/2020   NA 141 09/17/2020   K 3.7 09/17/2020   CL 102 09/17/2020   CO2 23 09/17/2020   Lab Results  Component Value Date   ALT 17 09/17/2020   AST 21 09/17/2020   ALKPHOS 87 09/17/2020   BILITOT 0.7 09/17/2020   Lab Results  Component Value Date   HGBA1C 5.3 09/17/2020   HGBA1C 5.3 11/10/2008   Lab Results  Component Value Date   INSULIN 9.4 09/17/2020   Lab Results  Component Value Date   TSH 1.600 09/17/2020   Lab Results  Component Value Date  CHOL 148 09/17/2020   HDL 74 09/17/2020   LDLCALC 64 09/17/2020   LDLDIRECT 124.6 11/10/2008   TRIG 44 09/17/2020   CHOLHDL 2 03/12/2020   Lab Results  Component Value Date   WBC 6.5 08/28/2020   HGB 14.5 08/28/2020   HCT 43.8 08/28/2020   MCV 90.3 08/28/2020   PLT 204.0 08/28/2020   Lab Results  Component Value Date   IRON 96 04/19/2015   FERRITIN 36.2 04/19/2015    Obesity Behavioral Intervention:   Approximately 15 minutes were spent on the discussion  below.  ASK: We discussed the diagnosis of obesity with Gregory Wilkins today and Gregory Wilkins agreed to give [place] permission to discuss obesity behavioral modification therapy today.  ASSESS: Gregory Wilkins has the diagnosis of obesity and his BMI today is 31.88. Tracer is in the action stage of change.   ADVISE: Burnard was educated on the multiple health risks of obesity as well as the benefit of weight loss to improve his health. He was advised of the need for long term treatment and the importance of lifestyle modifications to improve his current health and to decrease his risk of future health problems.  AGREE: Multiple dietary modification options and treatment options were discussed and Gregory Wilkins agreed to follow the recommendations documented in the above note.  ARRANGE: Gregory Wilkins was educated on the importance of frequent visits to treat obesity as outlined per CMS and USPSTF guidelines and agreed to schedule his next follow up appointment today.  Attestation Statements:   Reviewed by clinician on day of visit: allergies, medications, problem list, medical history, surgical history, family history, social history, and previous encounter notes.   Wilhemena Durie, am acting as transcriptionist for [employer], PA-C.  I have reviewed the above documentation for accuracy and completeness, and I agree with the above. Abby Potash, PA-C

## 2020-10-19 DIAGNOSIS — G4733 Obstructive sleep apnea (adult) (pediatric): Secondary | ICD-10-CM

## 2020-10-19 NOTE — Telephone Encounter (Signed)
Thank you. Can we place and order to increase treatment pressure to 10cm h20. He was still having some events, average AHI was 8.8/hour. Aim to wear CPAP every night.

## 2020-10-19 NOTE — Addendum Note (Signed)
Addended by: Valerie Salts on: 10/19/2020 03:47 PM   Modules accepted: Orders

## 2020-10-19 NOTE — Telephone Encounter (Signed)
Patient attached a copy of his cpap report to [employer].   Beth, can you please advise?

## 2020-10-19 NOTE — Progress Notes (Addendum)
Compliance summary 08/16/2020-11/13/2020 Days used 60/90 days (67.8%) Average usage 7 hours 19 minutes Average pressure 9 cm H2O Average air leak 9.2 L/min Average AHI 8.8  Would recommend increasing treatment pressure to 10cm h20

## 2020-10-24 ENCOUNTER — Ambulatory Visit: Payer: Medicare Other | Admitting: Psychology

## 2020-10-25 ENCOUNTER — Encounter (INDEPENDENT_AMBULATORY_CARE_PROVIDER_SITE_OTHER): Payer: Self-pay

## 2020-10-29 ENCOUNTER — Ambulatory Visit (INDEPENDENT_AMBULATORY_CARE_PROVIDER_SITE_OTHER): Payer: Medicare Other | Admitting: Bariatrics

## 2020-10-29 ENCOUNTER — Encounter (INDEPENDENT_AMBULATORY_CARE_PROVIDER_SITE_OTHER): Payer: Self-pay | Admitting: Bariatrics

## 2020-10-29 ENCOUNTER — Other Ambulatory Visit: Payer: Self-pay

## 2020-10-29 VITALS — BP 118/66 | HR 55 | Temp 98.0°F | Ht 69.0 in | Wt 210.0 lb

## 2020-10-29 DIAGNOSIS — E669 Obesity, unspecified: Secondary | ICD-10-CM | POA: Diagnosis not present

## 2020-10-29 DIAGNOSIS — E782 Mixed hyperlipidemia: Secondary | ICD-10-CM

## 2020-10-29 DIAGNOSIS — Z6833 Body mass index (BMI) 33.0-33.9, adult: Secondary | ICD-10-CM | POA: Diagnosis not present

## 2020-10-29 DIAGNOSIS — E559 Vitamin D deficiency, unspecified: Secondary | ICD-10-CM

## 2020-10-29 MED ORDER — VITAMIN D (ERGOCALCIFEROL) 1.25 MG (50000 UNIT) PO CAPS: 50000.0000 [IU] | ORAL_CAPSULE | ORAL | 0 refills | Status: DC

## 2020-10-30 ENCOUNTER — Ambulatory Visit (INDEPENDENT_AMBULATORY_CARE_PROVIDER_SITE_OTHER): Payer: Medicare Other | Admitting: Physician Assistant

## 2020-10-30 NOTE — Progress Notes (Signed)
Chief Complaint:   OBESITY Gregory Wilkins is here to discuss his progress with his obesity treatment plan along with follow-up of his obesity related diagnoses. Gregory Wilkins is on the Category 3 Plan and states he is following his eating plan approximately 100% of the time. Gregory Wilkins states he is doing yoga/tai Chi/walking/biking for 60/30-60 minutes 6/3 times per week.  Today's visit was #: 4 Starting weight: 230 lbs Starting date: 09/17/2020 Today's weight: 210 lbs Today's date: 10/29/2020 Total lbs lost to date: 20 lbs Total lbs lost since last in-office visit: 6 lbs  Interim History: Gregory Wilkins is down an additional 6 pounds and is doing well overall.   Subjective:   1. Mixed dyslipidemia Gregory Wilkins has dyslipidemia and has been trying to improve his cholesterol levels with intensive lifestyle modification including a low saturated fat diet, exercise and weight loss. He denies any chest pain, claudication or myalgias.  Taking Lipitor.  Lab Results  Component Value Date   ALT 17 09/17/2020   AST 21 09/17/2020   ALKPHOS 87 09/17/2020   BILITOT 0.7 09/17/2020   Lab Results  Component Value Date   CHOL 148 09/17/2020   HDL 74 09/17/2020   LDLCALC 64 09/17/2020   LDLDIRECT 124.6 11/10/2008   TRIG 44 09/17/2020   CHOLHDL 2 03/12/2020   2. Vitamin D deficiency Gregory Wilkins's Vitamin D level was 22.6 on 09/17/2020. He is currently taking prescription vitamin D 50,000 IU each week. He denies nausea, vomiting or muscle weakness.  He gets minimal sun exposure.  Assessment/Plan:   1. Mixed dyslipidemia Cardiovascular risk and specific lipid/LDL goals reviewed.  We discussed several lifestyle modifications today and Gregory Wilkins will continue to work on diet, exercise and weight loss efforts. Orders and follow up as documented in patient record.  Continue Lipitor.   Counseling Intensive lifestyle modifications are the first line treatment for this issue. . Dietary changes: Increase soluble fiber. Decrease simple  carbohydrates. . Exercise changes: Moderate to vigorous-intensity aerobic activity 150 minutes per week if tolerated. . Lipid-lowering medications: see documented in medical record.  2. Vitamin D deficiency Low Vitamin D level contributes to fatigue and are associated with obesity, breast, and colon cancer. He agrees to continue to take prescription Vitamin D @50 ,000 IU every week and will follow-up for routine testing of Vitamin D, at least 2-3 times per year to avoid over-replacement.  - Refill Vitamin D, Ergocalciferol, (DRISDOL) 1.25 MG (50000 UNIT) CAPS capsule; Take 1 capsule (50,000 Units total) by mouth every 7 (seven) days.  Dispense: 4 capsule; Refill: 0  3. Obesity, current BMI 31.0  Gregory Wilkins is currently in the action stage of change. As such, his goal is to continue with weight loss efforts. He has agreed to the Category 3 Plan.   He will work on meal planning, intentional eating, and will be consistent with the plan.  Smart Fruit sheet provided today.  Exercise goals: Continue activity.  Behavioral modification strategies: increasing lean protein intake, decreasing simple carbohydrates, increasing vegetables, increasing water intake, decreasing eating out, no skipping meals, meal planning and cooking strategies, keeping healthy foods in the home and planning for success.  Gregory Wilkins has agreed to follow-up with our clinic in 2 weeks with Gregory Potash, PA-C. He was informed of the importance of frequent follow-up visits to maximize his success with intensive lifestyle modifications for his multiple health conditions.   Objective:   Blood pressure 118/66, pulse (!) 55, temperature 98 F (36.7 C), height 5' 9"  (1.753 m), weight 210 lb (95.3  kg), SpO2 97 %. Body mass index is 31.01 kg/m.  General: Cooperative, alert, well developed, in no acute distress. HEENT: Conjunctivae and lids unremarkable. Cardiovascular: Regular rhythm.  Lungs: Normal work of breathing. Neurologic: No focal  deficits.   Lab Results  Component Value Date   CREATININE 0.89 09/17/2020   BUN 11 09/17/2020   NA 141 09/17/2020   K 3.7 09/17/2020   CL 102 09/17/2020   CO2 23 09/17/2020   Lab Results  Component Value Date   ALT 17 09/17/2020   AST 21 09/17/2020   ALKPHOS 87 09/17/2020   BILITOT 0.7 09/17/2020   Lab Results  Component Value Date   HGBA1C 5.3 09/17/2020   HGBA1C 5.3 11/10/2008   Lab Results  Component Value Date   INSULIN 9.4 09/17/2020   Lab Results  Component Value Date   TSH 1.600 09/17/2020   Lab Results  Component Value Date   CHOL 148 09/17/2020   HDL 74 09/17/2020   LDLCALC 64 09/17/2020   LDLDIRECT 124.6 11/10/2008   TRIG 44 09/17/2020   CHOLHDL 2 03/12/2020   Lab Results  Component Value Date   WBC 6.5 08/28/2020   HGB 14.5 08/28/2020   HCT 43.8 08/28/2020   MCV 90.3 08/28/2020   PLT 204.0 08/28/2020   Lab Results  Component Value Date   IRON 96 04/19/2015   FERRITIN 36.2 04/19/2015   Obesity Behavioral Intervention:   Approximately 15 minutes were spent on the discussion below.  ASK: We discussed the diagnosis of obesity with Gregory Wilkins today and Gregory Wilkins agreed to give [place] permission to discuss obesity behavioral modification therapy today.  ASSESS: Gregory Wilkins has the diagnosis of obesity and his BMI today is 31.0. Gregory Wilkins is in the action stage of change.   ADVISE: Gregory Wilkins was educated on the multiple health risks of obesity as well as the benefit of weight loss to improve his health. He was advised of the need for long term treatment and the importance of lifestyle modifications to improve his current health and to decrease his risk of future health problems.  AGREE: Multiple dietary modification options and treatment options were discussed and Gregory Wilkins agreed to follow the recommendations documented in the above note.  ARRANGE: Gregory Wilkins was educated on the importance of frequent visits to treat obesity as outlined per CMS and USPSTF guidelines and agreed to  schedule his next follow up appointment today.  Attestation Statements:   Reviewed by clinician on day of visit: allergies, medications, problem list, medical history, surgical history, family history, social history, and previous encounter notes.  I, Water quality scientist, CMA, am acting as Location manager for [employer], DO  I have reviewed the above documentation for accuracy and completeness, and I agree with the above. Jearld Lesch, DO

## 2020-11-07 ENCOUNTER — Ambulatory Visit (INDEPENDENT_AMBULATORY_CARE_PROVIDER_SITE_OTHER): Payer: Medicare Other | Admitting: Psychology

## 2020-11-07 DIAGNOSIS — F4322 Adjustment disorder with anxiety: Secondary | ICD-10-CM

## 2020-11-20 ENCOUNTER — Encounter (INDEPENDENT_AMBULATORY_CARE_PROVIDER_SITE_OTHER): Payer: Self-pay | Admitting: Physician Assistant

## 2020-11-20 ENCOUNTER — Other Ambulatory Visit: Payer: Self-pay

## 2020-11-20 ENCOUNTER — Ambulatory Visit (INDEPENDENT_AMBULATORY_CARE_PROVIDER_SITE_OTHER): Payer: Medicare Other | Admitting: Physician Assistant

## 2020-11-20 VITALS — BP 123/71 | HR 56 | Temp 97.8°F | Ht 69.0 in | Wt 203.0 lb

## 2020-11-20 DIAGNOSIS — E7849 Other hyperlipidemia: Secondary | ICD-10-CM

## 2020-11-20 DIAGNOSIS — E669 Obesity, unspecified: Secondary | ICD-10-CM

## 2020-11-20 DIAGNOSIS — Z6834 Body mass index (BMI) 34.0-34.9, adult: Secondary | ICD-10-CM | POA: Diagnosis not present

## 2020-11-20 DIAGNOSIS — E559 Vitamin D deficiency, unspecified: Secondary | ICD-10-CM | POA: Diagnosis not present

## 2020-11-20 DIAGNOSIS — E782 Mixed hyperlipidemia: Secondary | ICD-10-CM

## 2020-11-20 MED ORDER — VITAMIN D (ERGOCALCIFEROL) 1.25 MG (50000 UNIT) PO CAPS: 50000.0000 [IU] | ORAL_CAPSULE | ORAL | 0 refills | Status: DC

## 2020-11-21 ENCOUNTER — Ambulatory Visit (INDEPENDENT_AMBULATORY_CARE_PROVIDER_SITE_OTHER): Payer: Medicare Other | Admitting: Psychology

## 2020-11-21 ENCOUNTER — Ambulatory Visit: Payer: Medicare Other | Attending: Internal Medicine

## 2020-11-21 DIAGNOSIS — F4322 Adjustment disorder with anxiety: Secondary | ICD-10-CM

## 2020-11-21 DIAGNOSIS — Z23 Encounter for immunization: Secondary | ICD-10-CM

## 2020-11-21 NOTE — Progress Notes (Signed)
   Covid-19 Vaccination Clinic  Name:  Gregory Wilkins    MRN: [id number] DOB: 08-31-1940  11/21/2020  [name] was observed post Covid-19 immunization for 15 minutes without incident. He was provided with Vaccine Information Sheet and instruction to access the V-Safe system.   [name] was instructed to call 911 with any severe reactions post vaccine: Marland Kitchen Difficulty breathing  . Swelling of face and throat  . A fast heartbeat  . A bad rash all over body  . Dizziness and weakness   Immunizations Administered    Name Date Dose VIS Date Route   PFIZER Comrnaty(Gray TOP) Covid-19 Vaccine 11/21/2020 11:21 AM 0.3 mL 07/05/2020 Intramuscular   Manufacturer: [place]   Lot: [id number]   NDC: [phone]

## 2020-11-21 NOTE — Progress Notes (Signed)
Chief Complaint:   OBESITY Gregory Wilkins is here to discuss his progress with his obesity treatment plan along with follow-up of his obesity related diagnoses. Gregory Wilkins is on the Category 3 Plan and states he is following his eating plan approximately 97% of the time. Gregory Wilkins states he is doing yoga for 60+ minutes 5-7 times per week, and walking and biking for 60 minutes 2-3 times per week.  Today's visit was #: 5 Starting weight: 230 lbs Starting date: 09/17/2020 Today's weight: 203 lbs Today's date: 11/20/2020 Total lbs lost to date: 27 Total lbs lost since last in-office visit: 7  Interim History: Gregory Wilkins did very well with weight loss. He struggles with getting all of the food in daily but he makes himself eat it all.  Subjective:   1. Vitamin D deficiency Gregory Wilkins is on Vit D, and he denies nausea, vomiting, or muscle weakness.  2. Other hyperlipidemia Gregory Wilkins is on atorvastatin, and he denies myalgias or chest pain.  Assessment/Plan:   1. Vitamin D deficiency Low Vitamin D level contributes to fatigue and are associated with obesity, breast, and colon cancer. We will refill prescription Vitamin D for 1 month. Gregory Wilkins will follow-up for routine testing of Vitamin D, at least 2-3 times per year to avoid over-replacement.  - Vitamin D, Ergocalciferol, (DRISDOL) 1.25 MG (50000 UNIT) CAPS capsule; Take 1 capsule (50,000 Units total) by mouth every 7 (seven) days.  Dispense: 4 capsule; Refill: 0  2. Other hyperlipidemia Cardiovascular risk and specific lipid/LDL goals reviewed. We discussed several lifestyle modifications today. Gregory Wilkins will continue with atorvastatin, and will continue to work on diet, exercise and weight loss efforts. Orders and follow up as documented in patient record.   Counseling Intensive lifestyle modifications are the first line treatment for this issue. . Dietary changes: Increase soluble fiber. Decrease simple carbohydrates. . Exercise changes: Moderate to vigorous-intensity  aerobic activity 150 minutes per week if tolerated. . Lipid-lowering medications: see documented in medical record.  3. Class 1 obesity with serious comorbidity and body mass index (BMI) of 34.0 to 34.9 in adult, unspecified obesity type Gregory Wilkins is currently in the action stage of change. As such, his goal is to continue with weight loss efforts. He has agreed to the Category 3 Plan.   Exercise goals: As is.  Behavioral modification strategies: meal planning and cooking strategies and keeping healthy foods in the home.  Gregory Wilkins has agreed to follow-up with our clinic in 2 to 3 weeks. He was informed of the importance of frequent follow-up visits to maximize his success with intensive lifestyle modifications for his multiple health conditions.   Objective:   Blood pressure 123/71, pulse (!) 56, temperature 97.8 F (36.6 C), height 5\' 9"  (1.753 m), weight 203 lb (92.1 kg), SpO2 97 %. Body mass index is 29.98 kg/m.  General: Cooperative, alert, well developed, in no acute distress. HEENT: Conjunctivae and lids unremarkable. Cardiovascular: Regular rhythm.  Lungs: Normal work of breathing. Neurologic: No focal deficits.   Lab Results  Component Value Date   CREATININE 0.89 09/17/2020   BUN 11 09/17/2020   NA 141 09/17/2020   K 3.7 09/17/2020   CL 102 09/17/2020   CO2 23 09/17/2020   Lab Results  Component Value Date   ALT 17 09/17/2020   AST 21 09/17/2020   ALKPHOS 87 09/17/2020   BILITOT 0.7 09/17/2020   Lab Results  Component Value Date   HGBA1C 5.3 09/17/2020   HGBA1C 5.3 11/10/2008   Lab Results  Component Value Date   INSULIN 9.4 09/17/2020   Lab Results  Component Value Date   TSH 1.600 09/17/2020   Lab Results  Component Value Date   CHOL 148 09/17/2020   HDL 74 09/17/2020   LDLCALC 64 09/17/2020   LDLDIRECT 124.6 11/10/2008   TRIG 44 09/17/2020   CHOLHDL 2 03/12/2020   Lab Results  Component Value Date   WBC 6.5 08/28/2020   HGB 14.5 08/28/2020   HCT  43.8 08/28/2020   MCV 90.3 08/28/2020   PLT 204.0 08/28/2020   Lab Results  Component Value Date   IRON 96 04/19/2015   FERRITIN 36.2 04/19/2015    Obesity Behavioral Intervention:   Approximately 15 minutes were spent on the discussion below.  ASK: We discussed the diagnosis of obesity with Gregory Wilkins today and Gregory Wilkins agreed to give [place] permission to discuss obesity behavioral modification therapy today.  ASSESS: Gregory Wilkins has the diagnosis of obesity and his BMI today is 29.96. Gregory Wilkins is in the action stage of change.   ADVISE: Gregory Wilkins was educated on the multiple health risks of obesity as well as the benefit of weight loss to improve his health. He was advised of the need for long term treatment and the importance of lifestyle modifications to improve his current health and to decrease his risk of future health problems.  AGREE: Multiple dietary modification options and treatment options were discussed and Gregory Wilkins agreed to follow the recommendations documented in the above note.  ARRANGE: Gregory Wilkins was educated on the importance of frequent visits to treat obesity as outlined per CMS and USPSTF guidelines and agreed to schedule his next follow up appointment today.  Attestation Statements:   Reviewed by clinician on day of visit: allergies, medications, problem list, medical history, surgical history, family history, social history, and previous encounter notes.   Wilhemena Durie, am acting as transcriptionist for [employer], PA-C.  I have reviewed the above documentation for accuracy and completeness, and I agree with the above. Abby Potash, PA-C

## 2020-11-23 ENCOUNTER — Other Ambulatory Visit (HOSPITAL_BASED_OUTPATIENT_CLINIC_OR_DEPARTMENT_OTHER): Payer: Self-pay

## 2020-11-23 MED ORDER — PFIZER-BIONT COVID-19 VAC-TRIS 30 MCG/0.3ML IM SUSP
INTRAMUSCULAR | 0 refills | Status: DC
  Filled 2020-11-23: qty 0.3, 1d supply, fill #0

## 2020-12-05 ENCOUNTER — Ambulatory Visit (INDEPENDENT_AMBULATORY_CARE_PROVIDER_SITE_OTHER): Payer: Medicare Other | Admitting: Psychology

## 2020-12-05 DIAGNOSIS — F4322 Adjustment disorder with anxiety: Secondary | ICD-10-CM

## 2020-12-17 ENCOUNTER — Encounter (INDEPENDENT_AMBULATORY_CARE_PROVIDER_SITE_OTHER): Payer: Self-pay | Admitting: Bariatrics

## 2020-12-17 ENCOUNTER — Other Ambulatory Visit: Payer: Self-pay

## 2020-12-17 ENCOUNTER — Ambulatory Visit (INDEPENDENT_AMBULATORY_CARE_PROVIDER_SITE_OTHER): Payer: Medicare Other | Admitting: Bariatrics

## 2020-12-17 VITALS — BP 107/63 | HR 53 | Temp 97.9°F | Ht 69.0 in | Wt 199.0 lb

## 2020-12-17 DIAGNOSIS — Z6833 Body mass index (BMI) 33.0-33.9, adult: Secondary | ICD-10-CM | POA: Diagnosis not present

## 2020-12-17 DIAGNOSIS — E669 Obesity, unspecified: Secondary | ICD-10-CM | POA: Diagnosis not present

## 2020-12-17 DIAGNOSIS — E7849 Other hyperlipidemia: Secondary | ICD-10-CM

## 2020-12-17 DIAGNOSIS — I10 Essential (primary) hypertension: Secondary | ICD-10-CM | POA: Diagnosis not present

## 2020-12-19 ENCOUNTER — Ambulatory Visit: Payer: Medicare Other | Admitting: Psychology

## 2020-12-19 NOTE — Progress Notes (Signed)
Chief Complaint:   OBESITY Gregory Wilkins is here to discuss his progress with his obesity treatment plan along with follow-up of his obesity related diagnoses. Gregory Wilkins is on the Category 3 Plan and states he is following his eating plan approximately 90% of the time. Gregory Wilkins states he is doing yoga and tai chi 60 minutes 2-7 times per week.  Today's visit was #: 6 Starting weight: 230 lbs Starting date: 09/17/2020 Today's weight: 199 lbs Today's date: 12/17/2020 Total lbs lost to date: 31 Total lbs lost since last in-office visit: 4  Interim History: Gregory Wilkins is down an additional 4 lbs and has done well overall. It is getting harder and his weight is not going down as fast.  Subjective:   1. Other hyperlipidemia Presley is taking Lipitor.  2. Essential hypertension Eual's BP is controlled.   Assessment/Plan:   1. Other hyperlipidemia Cardiovascular risk and specific lipid/LDL goals reviewed.  We discussed several lifestyle modifications today and Gregory Wilkins will continue to work on diet, exercise and weight loss efforts. Orders and follow up as documented in patient record. Continue current treatment plan.  Counseling Intensive lifestyle modifications are the first line treatment for this issue. . Dietary changes: Increase soluble fiber. Decrease simple carbohydrates. . Exercise changes: Moderate to vigorous-intensity aerobic activity 150 minutes per week if tolerated. . Lipid-lowering medications: see documented in medical record.  2. Essential hypertension Gregory Wilkins is working on healthy weight loss and exercise to improve blood pressure control. We will watch for signs of hypotension as he continues his lifestyle modifications. Continue current treatment plan.  3. Obesity, current BMI 29 Gregory Wilkins is currently in the action stage of change. As such, his goal is to continue with weight loss efforts. He has agreed to the Category 3 Plan.   Meal plan Intentional eating Will continue to journal  Exercise  goals: Pt is "wearing the apple watch". He is doing body weight and Yoga.  Behavioral modification strategies: increasing lean protein intake, decreasing simple carbohydrates, increasing vegetables, increasing water intake, decreasing eating out, no skipping meals, meal planning and cooking strategies and keeping healthy foods in the home.  Gregory Wilkins has agreed to follow-up with our clinic in 2 weeks with Olivia Mackie. He was informed of the importance of frequent follow-up visits to maximize his success with intensive lifestyle modifications for his multiple health conditions.   Objective:   Blood pressure 107/63, pulse (!) 53, temperature 97.9 F (36.6 C), height 5' 9"  (1.753 m), weight 199 lb (90.3 kg), SpO2 95 %. Body mass index is 29.39 kg/m.  General: Cooperative, alert, well developed, in no acute distress. HEENT: Conjunctivae and lids unremarkable. Cardiovascular: Regular rhythm.  Lungs: Normal work of breathing. Neurologic: No focal deficits.   Lab Results  Component Value Date   CREATININE 0.89 09/17/2020   BUN 11 09/17/2020   NA 141 09/17/2020   K 3.7 09/17/2020   CL 102 09/17/2020   CO2 23 09/17/2020   Lab Results  Component Value Date   ALT 17 09/17/2020   AST 21 09/17/2020   ALKPHOS 87 09/17/2020   BILITOT 0.7 09/17/2020   Lab Results  Component Value Date   HGBA1C 5.3 09/17/2020   HGBA1C 5.3 11/10/2008   Lab Results  Component Value Date   INSULIN 9.4 09/17/2020   Lab Results  Component Value Date   TSH 1.600 09/17/2020   Lab Results  Component Value Date   CHOL 148 09/17/2020   HDL 74 09/17/2020   LDLCALC 64 09/17/2020  LDLDIRECT 124.6 11/10/2008   TRIG 44 09/17/2020   CHOLHDL 2 03/12/2020   Lab Results  Component Value Date   WBC 6.5 08/28/2020   HGB 14.5 08/28/2020   HCT 43.8 08/28/2020   MCV 90.3 08/28/2020   PLT 204.0 08/28/2020   Lab Results  Component Value Date   IRON 96 04/19/2015   FERRITIN 36.2 04/19/2015    Obesity Behavioral  Intervention:   Approximately 15 minutes were spent on the discussion below.  ASK: We discussed the diagnosis of obesity with [name] today and Suhail agreed to give [place] permission to discuss obesity behavioral modification therapy today.  ASSESS: Rodolphe has the diagnosis of obesity and his BMI today is 29.4. Sadat is in the action stage of change.   ADVISE: Brennin was educated on the multiple health risks of obesity as well as the benefit of weight loss to improve his health. He was advised of the need for long term treatment and the importance of lifestyle modifications to improve his current health and to decrease his risk of future health problems.  AGREE: Multiple dietary modification options and treatment options were discussed and Cortlandt agreed to follow the recommendations documented in the above note.  ARRANGE: Teller was educated on the importance of frequent visits to treat obesity as outlined per CMS and USPSTF guidelines and agreed to schedule his next follow up appointment today.  Attestation Statements:   Reviewed by clinician on day of visit: allergies, medications, problem list, medical history, surgical history, family history, social history, and previous encounter notes.  Coral Ceo, CMA, am acting as Location manager for [employer], DO.  I have reviewed the above documentation for accuracy and completeness, and I agree with the above. Gregory Wilkins Lesch, DO

## 2020-12-20 ENCOUNTER — Encounter (INDEPENDENT_AMBULATORY_CARE_PROVIDER_SITE_OTHER): Payer: Self-pay | Admitting: Bariatrics

## 2021-01-02 ENCOUNTER — Ambulatory Visit (INDEPENDENT_AMBULATORY_CARE_PROVIDER_SITE_OTHER): Payer: Medicare Other | Admitting: Psychology

## 2021-01-02 ENCOUNTER — Other Ambulatory Visit: Payer: Self-pay

## 2021-01-02 DIAGNOSIS — F4322 Adjustment disorder with anxiety: Secondary | ICD-10-CM

## 2021-01-08 ENCOUNTER — Telehealth: Payer: Self-pay | Admitting: Internal Medicine

## 2021-01-08 DIAGNOSIS — G2581 Restless legs syndrome: Secondary | ICD-10-CM

## 2021-01-08 NOTE — Telephone Encounter (Signed)
PDMP okay, Rx sent 

## 2021-01-08 NOTE — Telephone Encounter (Signed)
Requesting: clonazepam 0.5mg  Contract: 08/28/2020 UDS: 08/28/2020 Last Visit: 08/28/2020 Next Visit: 02/26/2021 Last Refill: 06/12/2020 #90 and 0RF  Please Advise

## 2021-01-16 ENCOUNTER — Ambulatory Visit (INDEPENDENT_AMBULATORY_CARE_PROVIDER_SITE_OTHER): Payer: Medicare Other | Admitting: Psychology

## 2021-01-16 ENCOUNTER — Other Ambulatory Visit: Payer: Self-pay

## 2021-01-16 ENCOUNTER — Encounter (INDEPENDENT_AMBULATORY_CARE_PROVIDER_SITE_OTHER): Payer: Self-pay | Admitting: Physician Assistant

## 2021-01-16 ENCOUNTER — Ambulatory Visit (INDEPENDENT_AMBULATORY_CARE_PROVIDER_SITE_OTHER): Payer: Medicare Other | Admitting: Physician Assistant

## 2021-01-16 VITALS — BP 124/55 | HR 73 | Temp 97.8°F | Ht 69.0 in | Wt 195.0 lb

## 2021-01-16 DIAGNOSIS — E669 Obesity, unspecified: Secondary | ICD-10-CM

## 2021-01-16 DIAGNOSIS — Z6834 Body mass index (BMI) 34.0-34.9, adult: Secondary | ICD-10-CM | POA: Diagnosis not present

## 2021-01-16 DIAGNOSIS — F4322 Adjustment disorder with anxiety: Secondary | ICD-10-CM

## 2021-01-16 DIAGNOSIS — E8881 Metabolic syndrome: Secondary | ICD-10-CM

## 2021-01-16 DIAGNOSIS — E559 Vitamin D deficiency, unspecified: Secondary | ICD-10-CM | POA: Diagnosis not present

## 2021-01-16 MED ORDER — VITAMIN D (ERGOCALCIFEROL) 1.25 MG (50000 UNIT) PO CAPS: 50000.0000 [IU] | ORAL_CAPSULE | ORAL | 0 refills | Status: DC

## 2021-01-22 NOTE — Progress Notes (Signed)
Chief Complaint:   OBESITY Mariusz is here to discuss his progress with his obesity treatment plan along with follow-up of his obesity related diagnoses. Okechukwu is on the Category 3 Plan and states he is following his eating plan approximately 90% of the time. Yotam states he is lifting weights for 20 minutes 3 times per week, exercising with a trainer for 60 minutes 1 time per week, doing yoga for 60 minutes 6 times per week, and bike riding for 30 minutes 4 times per week.  Today's visit was #: 7 Starting weight: 230 lbs Starting date: 09/17/2020 Today's weight: 195 lbs Today's date: 01/16/2021 Total lbs lost to date: 35 Total lbs lost since last in-office visit: 4  Interim History: Benjamim reports that he has had house guests and eating has been more of a challenge. He has a birthday coming up.  Subjective:   1. Insulin resistance Amaziah is not on medications, and his hunger is controlled. Last insulin level was 9.4.  2. Vitamin D deficiency Hadriel is on Vit D, and he denies nausea, vomiting, or muscle weakness.  Assessment/Plan:   1. Insulin resistance Normon will continue his meal plan, and will continue to work on weight loss, exercise, and decreasing simple carbohydrates to help decrease the risk of diabetes. Nour agreed to follow-up with [place] as directed to closely monitor his progress.  2. Vitamin D deficiency Low Vitamin D level contributes to fatigue and are associated with obesity, breast, and colon cancer. We will refill prescription Vitamin D for 1 month. Taevion will follow-up for routine testing of Vitamin D, at least 2-3 times per year to avoid over-replacement.  - Vitamin D, Ergocalciferol, (DRISDOL) 1.25 MG (50000 UNIT) CAPS capsule; Take 1 capsule (50,000 Units total) by mouth every 7 (seven) days.  Dispense: 4 capsule; Refill: 0  3. Class 1 obesity with serious comorbidity and body mass index (BMI) of 34.0 to 34.9 in adult, unspecified obesity type Dev is currently in the  action stage of change. As such, his goal is to continue with weight loss efforts. He has agreed to the Category 3 Plan.   Exercise goals: As is.  Behavioral modification strategies: meal planning and cooking strategies and celebration eating strategies.  Charan has agreed to follow-up with our clinic in 3 weeks. He was informed of the importance of frequent follow-up visits to maximize his success with intensive lifestyle modifications for his multiple health conditions.   Objective:   Blood pressure (!) 124/55, pulse 73, temperature 97.8 F (36.6 C), height 5\' 9"  (1.753 m), weight 195 lb (88.5 kg), SpO2 100 %. Body mass index is 28.8 kg/m.  General: Cooperative, alert, well developed, in no acute distress. HEENT: Conjunctivae and lids unremarkable. Cardiovascular: Regular rhythm.  Lungs: Normal work of breathing. Neurologic: No focal deficits.   Lab Results  Component Value Date   CREATININE 0.89 09/17/2020   BUN 11 09/17/2020   NA 141 09/17/2020   K 3.7 09/17/2020   CL 102 09/17/2020   CO2 23 09/17/2020   Lab Results  Component Value Date   ALT 17 09/17/2020   AST 21 09/17/2020   ALKPHOS 87 09/17/2020   BILITOT 0.7 09/17/2020   Lab Results  Component Value Date   HGBA1C 5.3 09/17/2020   HGBA1C 5.3 11/10/2008   Lab Results  Component Value Date   INSULIN 9.4 09/17/2020   Lab Results  Component Value Date   TSH 1.600 09/17/2020   Lab Results  Component Value Date  CHOL 148 09/17/2020   HDL 74 09/17/2020   LDLCALC 64 09/17/2020   LDLDIRECT 124.6 11/10/2008   TRIG 44 09/17/2020   CHOLHDL 2 03/12/2020   Lab Results  Component Value Date   WBC 6.5 08/28/2020   HGB 14.5 08/28/2020   HCT 43.8 08/28/2020   MCV 90.3 08/28/2020   PLT 204.0 08/28/2020   Lab Results  Component Value Date   IRON 96 04/19/2015   FERRITIN 36.2 04/19/2015    Obesity Behavioral Intervention:   Approximately 15 minutes were spent on the discussion below.  ASK: We discussed  the diagnosis of obesity with [name] today and Reed agreed to give [place] permission to discuss obesity behavioral modification therapy today.  ASSESS: Hy has the diagnosis of obesity and his BMI today is 28.78. Ashtian is in the action stage of change.   ADVISE: Dmetrius was educated on the multiple health risks of obesity as well as the benefit of weight loss to improve his health. He was advised of the need for long term treatment and the importance of lifestyle modifications to improve his current health and to decrease his risk of future health problems.  AGREE: Multiple dietary modification options and treatment options were discussed and Zuriel agreed to follow the recommendations documented in the above note.  ARRANGE: Larnce was educated on the importance of frequent visits to treat obesity as outlined per CMS and USPSTF guidelines and agreed to schedule his next follow up appointment today.  Attestation Statements:   Reviewed by clinician on day of visit: allergies, medications, problem list, medical history, surgical history, family history, social history, and previous encounter notes.    Wilhemena Durie, am acting as transcriptionist for [employer], PA-C.  I have reviewed the above documentation for accuracy and completeness, and I agree with the above. Abby Potash, PA-C

## 2021-01-30 ENCOUNTER — Ambulatory Visit: Payer: Medicare Other | Admitting: Psychology

## 2021-02-04 ENCOUNTER — Other Ambulatory Visit: Payer: Self-pay | Admitting: Internal Medicine

## 2021-02-07 ENCOUNTER — Encounter (INDEPENDENT_AMBULATORY_CARE_PROVIDER_SITE_OTHER): Payer: Self-pay | Admitting: Bariatrics

## 2021-02-07 ENCOUNTER — Other Ambulatory Visit: Payer: Self-pay

## 2021-02-07 ENCOUNTER — Ambulatory Visit (INDEPENDENT_AMBULATORY_CARE_PROVIDER_SITE_OTHER): Payer: Medicare Other | Admitting: Bariatrics

## 2021-02-07 VITALS — BP 123/74 | HR 55 | Temp 97.6°F | Ht 69.0 in | Wt 194.0 lb

## 2021-02-07 DIAGNOSIS — Z6833 Body mass index (BMI) 33.0-33.9, adult: Secondary | ICD-10-CM

## 2021-02-07 DIAGNOSIS — E669 Obesity, unspecified: Secondary | ICD-10-CM | POA: Diagnosis not present

## 2021-02-07 DIAGNOSIS — E559 Vitamin D deficiency, unspecified: Secondary | ICD-10-CM | POA: Diagnosis not present

## 2021-02-07 DIAGNOSIS — I10 Essential (primary) hypertension: Secondary | ICD-10-CM

## 2021-02-07 MED ORDER — VITAMIN D (ERGOCALCIFEROL) 1.25 MG (50000 UNIT) PO CAPS: 50000.0000 [IU] | ORAL_CAPSULE | ORAL | 0 refills | Status: DC

## 2021-02-12 NOTE — Progress Notes (Signed)
Chief Complaint:   OBESITY Trigo is here to discuss his progress with his obesity treatment plan along with follow-up of his obesity related diagnoses. Roel is on the Category 3 Plan and states he is following his eating plan approximately 85-90% of the time. Fuller states he is doing cardio, yoga, and low bearing weights 30-60 minutes 2-5 times per week.  Today's visit was #: 8 Starting weight: 230 lbs Starting date: 09/17/2020 Today's weight: 194 lbs Today's date: 02/07/2021 Total lbs lost to date: 36 Total lbs lost since last in-office visit: 1  Interim History: Absalom is down 1 additional pound. His goal is 180-185 lbs. He is doing okay with his water and protein.  Subjective:   1. Vitamin D deficiency Kalyb denies nausea, vomiting, and muscle weakness.   2. Essential hypertension His BP is controlled.  Assessment/Plan:   1. Vitamin D deficiency Low Vitamin D level contributes to fatigue and are associated with obesity, breast, and colon cancer. He agrees to continue to take prescription Vitamin D @50 ,000 IU every week and will follow-up for routine testing of Vitamin D, at least 2-3 times per year to avoid over-replacement.  Refill- Vitamin D, Ergocalciferol, (DRISDOL) 1.25 MG (50000 UNIT) CAPS capsule; Take 1 capsule (50,000 Units total) by mouth every 7 (seven) days.  Dispense: 4 capsule; Refill: 0  2. Essential hypertension Fiore is working on healthy weight loss and exercise to improve blood pressure control. We will watch for signs of hypotension as he continues his lifestyle modifications. Continue current treatment plan.  3. Obesity, current BMI 28.6  Vaishnav is currently in the action stage of change. As such, his goal is to continue with weight loss efforts. He has agreed to the Category 3 Plan.   Exercise goals:  As is  Behavioral modification strategies: increasing lean protein intake, decreasing simple carbohydrates, increasing vegetables, increasing water intake,  decreasing eating out, no skipping meals, meal planning and cooking strategies, keeping healthy foods in the home, and planning for success.  Samiel has agreed to follow-up with our clinic in 2-3 weeks. He was informed of the importance of frequent follow-up visits to maximize his success with intensive lifestyle modifications for his multiple health conditions.   Objective:   Blood pressure 123/74, pulse (!) 55, temperature 97.6 F (36.4 C), height 5\' 9"  (1.753 m), weight 194 lb (88 kg), SpO2 96 %. Body mass index is 28.65 kg/m.  General: Cooperative, alert, well developed, in no acute distress. HEENT: Conjunctivae and lids unremarkable. Cardiovascular: Regular rhythm.  Lungs: Normal work of breathing. Neurologic: No focal deficits.   Lab Results  Component Value Date   CREATININE 0.89 09/17/2020   BUN 11 09/17/2020   NA 141 09/17/2020   K 3.7 09/17/2020   CL 102 09/17/2020   CO2 23 09/17/2020   Lab Results  Component Value Date   ALT 17 09/17/2020   AST 21 09/17/2020   ALKPHOS 87 09/17/2020   BILITOT 0.7 09/17/2020   Lab Results  Component Value Date   HGBA1C 5.3 09/17/2020   HGBA1C 5.3 11/10/2008   Lab Results  Component Value Date   INSULIN 9.4 09/17/2020   Lab Results  Component Value Date   TSH 1.600 09/17/2020   Lab Results  Component Value Date   CHOL 148 09/17/2020   HDL 74 09/17/2020   LDLCALC 64 09/17/2020   LDLDIRECT 124.6 11/10/2008   TRIG 44 09/17/2020   CHOLHDL 2 03/12/2020   Lab Results  Component Value Date  VD25OH 22.6 (L) 09/17/2020   Lab Results  Component Value Date   WBC 6.5 08/28/2020   HGB 14.5 08/28/2020   HCT 43.8 08/28/2020   MCV 90.3 08/28/2020   PLT 204.0 08/28/2020   Lab Results  Component Value Date   IRON 96 04/19/2015   FERRITIN 36.2 04/19/2015    Obesity Behavioral Intervention:   Approximately 15 minutes were spent on the discussion below.  ASK: We discussed the diagnosis of obesity with [name] today and  Emmaus agreed to give [place] permission to discuss obesity behavioral modification therapy today.  ASSESS: Arlander has the diagnosis of obesity and his BMI today is 28.6. Jubal is in the action stage of change.   ADVISE: Aashish was educated on the multiple health risks of obesity as well as the benefit of weight loss to improve his health. He was advised of the need for long term treatment and the importance of lifestyle modifications to improve his current health and to decrease his risk of future health problems.  AGREE: Multiple dietary modification options and treatment options were discussed and Gust agreed to follow the recommendations documented in the above note.  ARRANGE: Alejo was educated on the importance of frequent visits to treat obesity as outlined per CMS and USPSTF guidelines and agreed to schedule his next follow up appointment today.  Attestation Statements:   Reviewed by clinician on day of visit: allergies, medications, problem list, medical history, surgical history, family history, social history, and previous encounter notes.  Coral Ceo, CMA, am acting as Location manager for [employer], DO.  I have reviewed the above documentation for accuracy and completeness, and I agree with the above. Jearld Lesch, DO

## 2021-02-13 ENCOUNTER — Encounter (INDEPENDENT_AMBULATORY_CARE_PROVIDER_SITE_OTHER): Payer: Self-pay | Admitting: Bariatrics

## 2021-02-13 ENCOUNTER — Ambulatory Visit (INDEPENDENT_AMBULATORY_CARE_PROVIDER_SITE_OTHER): Payer: Medicare Other | Admitting: Psychology

## 2021-02-13 DIAGNOSIS — F4322 Adjustment disorder with anxiety: Secondary | ICD-10-CM

## 2021-02-26 ENCOUNTER — Other Ambulatory Visit (HOSPITAL_BASED_OUTPATIENT_CLINIC_OR_DEPARTMENT_OTHER): Payer: Self-pay

## 2021-02-26 ENCOUNTER — Other Ambulatory Visit: Payer: Self-pay

## 2021-02-26 ENCOUNTER — Ambulatory Visit (INDEPENDENT_AMBULATORY_CARE_PROVIDER_SITE_OTHER): Payer: Medicare Other | Admitting: Internal Medicine

## 2021-02-26 ENCOUNTER — Encounter: Payer: Self-pay | Admitting: Internal Medicine

## 2021-02-26 VITALS — BP 122/64 | HR 50 | Temp 97.9°F | Resp 18 | Ht 69.0 in | Wt 199.0 lb

## 2021-02-26 DIAGNOSIS — I2510 Atherosclerotic heart disease of native coronary artery without angina pectoris: Secondary | ICD-10-CM

## 2021-02-26 DIAGNOSIS — I2584 Coronary atherosclerosis due to calcified coronary lesion: Secondary | ICD-10-CM

## 2021-02-26 DIAGNOSIS — I10 Essential (primary) hypertension: Secondary | ICD-10-CM

## 2021-02-26 DIAGNOSIS — E782 Mixed hyperlipidemia: Secondary | ICD-10-CM | POA: Diagnosis not present

## 2021-02-26 LAB — BASIC METABOLIC PANEL
BUN: 18 mg/dL (ref 6–23)
CO2: 27 mEq/L (ref 19–32)
Calcium: 9.5 mg/dL (ref 8.4–10.5)
Chloride: 103 mEq/L (ref 96–112)
Creatinine, Ser: 1.03 mg/dL (ref 0.40–1.50)
GFR: 69.78 mL/min (ref >60.00)
Glucose, Bld: 84 mg/dL (ref 70–99)
Potassium: 4.3 mEq/L (ref 3.5–5.1)
Sodium: 140 mEq/L (ref 135–145)

## 2021-02-26 LAB — LIPID PANEL
Cholesterol: 138 mg/dL (ref 0–200)
HDL: 60.4 mg/dL (ref >39.00)
LDL Cholesterol: 70 mg/dL (ref 0–99)
NonHDL: 77.76
Total CHOL/HDL Ratio: 2
Triglycerides: 38 mg/dL (ref 0.0–149.0)
VLDL: 7.6 mg/dL (ref 0.0–40.0)

## 2021-02-26 MED ORDER — BOOSTRIX 5-2.5-18.5 LF-MCG/0.5 IM SUSY
PREFILLED_SYRINGE | INTRAMUSCULAR | 0 refills | Status: DC
  Filled 2021-02-26: qty 0.5, 1d supply, fill #0

## 2021-02-26 NOTE — Patient Instructions (Addendum)
Please proceed with your TDAP (tetanus) booster.  You may do this downstairs at the pharmacy today if you would like.   Also recommend a flu shot this fall   Check the  blood pressure BP GOAL is between 110/65 and  135/85. If it is consistently higher or lower, let me know    GO TO THE LAB : Get the blood work     Gregory Wilkins, [place] back for a checkup in 6 months

## 2021-02-26 NOTE — Progress Notes (Signed)
Subjective:    Patient ID: Gregory Wilkins, male    DOB: 1941-07-14, 80 y.o.   MRN: [id number]  DOS:  02/26/2021 Type of visit - description: Routine visit  Currently dealing with back pain, managed by Ortho.  Wt Readings from Last 3 Encounters:  02/26/21 199 lb (90.3 kg)  02/07/21 194 lb (88 kg)  01/16/21 195 lb (88.5 kg)     Review of Systems Denies chest pain or difficulty breathing. No lower extremity edema Diet is really good.   Past Medical History:  Diagnosis Date   Acne rosacea 12/23/2006   Qualifier: Diagnosis of  By: Linna Darner MD, Gwyndolyn Saxon   Dr Crista Luria   Formatting of this note might be different from the original. Overview:  Qualifier: Diagnosis of  By: Linna Darner MD, Gwyndolyn Saxon   Dr Crista Luria Overview:  Overview:  Qualifier: Diagnosis of  By: Linna Darner MD, Gwyndolyn Saxon   Dr Crista Luria  Overview:  Qualifier: Diagnosis of  By: Linna Darner MD, Gwyndolyn Saxon   Dr Crista Luria Overview:  Overview:  Overview:   Alcohol abuse    Allergic rhinitis    Allergic rhinitis 12/23/2006   Qualifier: Diagnosis of  By: Linna Darner MD, Gwyndolyn Saxon   Onset:in high school Character: perennial but increased seasonally Triggers :cat, dust, pollen Allergy testing: Dr Bernita Buffy Maintenance medications/ response:Flonase/ Nasonex; saline wash; Allegra Smoking history:age 80- 35, up to 2 ppd Family history pulmonary disease: no    Formatting of this note might be different from the original. 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 Overview:  Qualifier: Diagnosis of  By: Linna Darner MD, Susann Givens of this note might be different from the original. Overview:  Qualifier: Diagnosis of  By: Linna Darner MD, William  IMO 10/01 Updates   Enlarged prostate without lower urinary tract symptoms (luts) 11/09/2008   Formatting of this note might be different from the original. Overview:  Qualifier: Diagnosis of  By: Linna Darner MD, Gwyndolyn Saxon   No PMH of elevated PSA ; no biopsy Overview:  Overview:  Qualifier: Diagnosis of  By: Linna Darner MD, Gwyndolyn Saxon   No PMH of elevated PSA ; no biopsy Overview:  Overview:  Overview:  Qualifier: Diagnosis of  By: Linna Darner MD, Gwyndolyn Saxon   No PMH of elevated PSA ; no biopsy   Essential (primary) hypertension 09/21/2007   Qualifier: Diagnosis of  By: Linna Darner MD, Susann Givens of this note might be different from the original. Overview:  Qualifier: Diagnosis of  By: Linna Darner MD, [place]:  Blood pressure appears to be well controlled. Renal function will be checked.  He has a minor cough; this does not appear to be significant enough to warrant change to losartan Overview:  Overview:  Qu   Family history of type C  viral hepatitis 06/30/2012   Brother had liver transplant    Hepatitis A 04/2019   History of colonic polyps 12/23/2006   Qualifier: Diagnosis of  By: Linna Darner MD, Gwyndolyn Saxon   Dr Earlie Raveling, GI Polypectomy X2 , 4/16 /2014. One was  transitional cell adenoma Due annually No FH colon cancer   Formatting of this note might be different from the original. Overview:  Qualifier: Diagnosis of  By: Linna Darner MD, Gwyndolyn Saxon   Dr Earlie Raveling, GI Polypectomy X2 , 4/16 /2014. One was  transitional cell adenoma Due annually No FH colon ca   History of urinary stone 09/21/2007   Qualifier: Diagnosis of  By: Linna Darner MD, Jessie Foot , passed spontaneously No Urologist involvement   Formatting of this note might be different from the original. Overview:   Qualifier: Diagnosis of  By: Linna Darner MD, Jessie Foot , passed spontaneously No Urologist involvement Overview:  Overview:  Qualifier: Diagnosis of  By: Linna Darner MD, Jessie Foot , passed spontaneously No Urologist involvement    HYPERLIPIDEMIA 09/21/2007   Qualifier: Diagnosis of  By: Linna Darner MD, Cristopher Estimable Lipoprofile 2006 : LDL  108 ( 1072  / 575  ),  HDL 63 , Triglycerides  40. LDL goal = < 135 ; ideally < 105. No FH CAD   Formatting of this note might be different from the original. Overview:  Qualifier: Diagnosis of  By: Linna Darner MD, Cristopher Estimable Lipoprofile 2006 : LDL  108 ( 1072  / 575  ),  HDL 63 , Triglycerides  40. LDL goal = < 135 ; idea   Hypertension    Joint pain    Liver injury 04/2019   Liver injury, initial encounter 05/17/2019   Macular degeneration 01/06/2018   Malignant neoplasm of skin    Mallet finger 2012     4th L DIP,Dr Sypher   Mixed dyslipidemia 03/23/2020   Nephrolithiasis    Nocturnal hypoxia 11/04/2019   Nuclear sclerotic cataract of both eyes    Obesity (BMI 30-39.9) 01/04/2014   OSA (obstructive sleep apnea) 05/17/2020   Osteoarthritis    Overweight 03/23/2020   Pain in left foot 06/28/2020   PCP NOTES >>>>>>>>>>>>>>>> 05/26/2019   Personal history of other malignant neoplasm of skin 06/30/2012   Dr Wilhemina Bonito, Derm X3 Local resection from  right forearm and forehead.   Mohs surgery of the nose 06/2012, Dr Verlon Au of this note might be different from the original. Overview:  Dr Wilhemina Bonito, Derm X3 Local resection from  right forearm and forehead.   Mohs surgery of the nose 06/2012, Dr Sarajane Jews Overview:  Overview:  Dr Wilhemina Bonito, Derm X3 Local resection from  right forearm and fo   RLS (restless legs syndrome)    Sleep apnea    Snoring 02/01/2014   Formatting of this note might be different from the original. Last Assessment & Plan:  The patient has been noted to have significant snoring, but no one has ever really commented on an abnormal breathing pattern  during sleep. Although he is overweight and does have restless sleep, the rest of his history is not overly impressive for sleep disordered breathing. If he does have sleep apnea, I suspe    Past Surgical History:  Procedure Laterality Date   basal cell cancer     X 3   INGUINAL HERNIA REPAIR  [age]   right,left   OPEN REDUCTION INTERNAL FIXATION (ORIF) METACARPAL Right  05/19/2013   Procedure: OPEN REDUCTION INTERNAL FIXATION  RIGHT SMALL  METACARPAL FRACTURE;  Surgeon: Cammie Sickle., MD;  Location: [facility];  Service: Orthopedics;  Laterality: Right;   POLYPECTOMY     X 3; Dr Earlean Shawl   WISDOM TOOTH EXTRACTION      Allergies as of 02/26/2021       Reactions   Penicillins Hives   hives   Ramipril Anaphylaxis, Swelling        Medication List        Accurate as of February 26, 2021 11:59 PM. If you have any questions, ask your nurse or doctor.          STOP taking these medications    Pfizer-BioNT COVID-19 Vac-TriS Susp injection Generic drug: COVID-19 mRNA Vac-TriS Therapist, music) Stopped by: Kathlene November, MD       TAKE these medications    apixaban 5 MG Tabs tablet Commonly known as: ELIQUIS Take 5 mg by mouth 2 (two) times daily.   aspirin EC 81 MG tablet Take 1 tablet (81 mg total) by mouth daily. Swallow whole.   atorvastatin 10 MG tablet Commonly known as: LIPITOR Take 1 tablet (10 mg total) by mouth at bedtime.   Boostrix 5-2.5-18.5 LF-MCG/0.5 injection Generic drug: Tdap Inject into the muscle.   cetirizine 10 MG tablet Commonly known as: ZYRTEC Take 10 mg by mouth daily.   clonazePAM 0.5 MG tablet Commonly known as: KLONOPIN TAKE 1 TABLET BY MOUTH EVERY NIGHT AT BEDTIME AS NEEDED FOR RESTLESS LEG   hydrochlorothiazide 12.5 MG tablet Commonly known as: HYDRODIURIL Take 1 tablet (12.5 mg total) by mouth daily.   ICAPS AREDS 2 PO Take 1 tablet by mouth daily in the afternoon.   losartan 25 MG tablet Commonly known as: COZAAR Take  1 tablet (25 mg total) by mouth daily.   metoprolol tartrate 50 MG tablet Commonly known as: LOPRESSOR Take 50 mg by mouth as needed (fast heart rate).   multivitamin with minerals Tabs tablet Take 1 tablet by mouth daily.   Vitamin D (Ergocalciferol) 1.25 MG (50000 UNIT) Caps capsule Commonly known as: DRISDOL Take 1 capsule (50,000 Units total) by mouth every 7 (seven) days.           Objective:   Physical Exam BP 122/64 (BP Location: Left Arm, Patient Position: Sitting, Cuff Size: Small)   Pulse (!) 50   Temp 97.9 F (36.6 C) (Oral)   Resp 18   Ht '5\' 9"'$  (1.753 m)   Wt 199 lb (90.3 kg)   SpO2 97%   BMI 29.39 kg/m  General:   Well developed, NAD, BMI noted. HEENT:  Normocephalic . Face symmetric, atraumatic Lungs:  CTA B Normal respiratory effort, no intercostal retractions, no accessory muscle use. Heart: Bradycardic. Lower extremities: no pretibial edema bilaterally  Skin: Not pale. Not jaundice Neurologic:  alert & oriented X3.  Speech normal, gait appropriate for age and unassisted Psych--  Cognition and judgment appear intact.  Cooperative with normal attention span and concentration.  Behavior appropriate. No anxious or depressed appearing.      Assessment     Assessment (new patient 05/17/2019) HTN High Cholesterol Restless leg (clonazepam prn) Parox. A. 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  Check BMP High cholesterol: On Lipitor, well controlled, requested FLP. Paroxysmal  A. fib: Anticoagulated, no complications that I can tell. Obesity: Doing great, followed up at the wellness clinic, since he started going there has lost 40 pounds. Spinal stenosis: Dx by Manson Passey, current treatment is physical therapy Preventive care reviewed RTC 6 months    This visit occurred during  the SARS-CoV-2 public health emergency.  Safety protocols were in place, including screening questions prior to the visit, additional usage of staff PPE, and extensive cleaning of exam room while observing appropriate contact time as indicated for disinfecting solutions.

## 2021-02-27 ENCOUNTER — Ambulatory Visit: Payer: Medicare Other | Admitting: Psychology

## 2021-02-27 NOTE — Assessment & Plan Note (Signed)
Immunizations:  Boostrix recommended S/p covid vax x 4 Flu shot rec  Prostate ca screening: last PSA 2021 wnl, likes to revisit next year  CCS: C-scope 2014: Polyps 2015: No polyps 2020: Tubular adenoma.  Next per GI if patient interested.

## 2021-02-27 NOTE — Assessment & Plan Note (Signed)
HTN: BP is very good today, similar readings at home, continue HCTZ, losartan, metoprolol.  Check BMP High cholesterol: On Lipitor, well controlled, requested FLP. Paroxysmal A. fib: Anticoagulated, no complications that I can tell. Obesity: Doing great, followed up at the wellness clinic, since he started going there has lost 40 pounds. Spinal stenosis: Dx by Manson Passey, current treatment is physical therapy Preventive care reviewed RTC 6 months

## 2021-03-05 ENCOUNTER — Other Ambulatory Visit: Payer: Self-pay

## 2021-03-05 ENCOUNTER — Ambulatory Visit (INDEPENDENT_AMBULATORY_CARE_PROVIDER_SITE_OTHER): Payer: Medicare Other | Admitting: Family Medicine

## 2021-03-05 ENCOUNTER — Encounter (INDEPENDENT_AMBULATORY_CARE_PROVIDER_SITE_OTHER): Payer: Self-pay | Admitting: Family Medicine

## 2021-03-05 VITALS — BP 115/55 | HR 51 | Temp 98.0°F | Ht 69.0 in | Wt 193.0 lb

## 2021-03-05 DIAGNOSIS — I2584 Coronary atherosclerosis due to calcified coronary lesion: Secondary | ICD-10-CM | POA: Diagnosis not present

## 2021-03-05 DIAGNOSIS — I2510 Atherosclerotic heart disease of native coronary artery without angina pectoris: Secondary | ICD-10-CM | POA: Diagnosis not present

## 2021-03-05 DIAGNOSIS — E7849 Other hyperlipidemia: Secondary | ICD-10-CM | POA: Diagnosis not present

## 2021-03-05 DIAGNOSIS — Z6833 Body mass index (BMI) 33.0-33.9, adult: Secondary | ICD-10-CM | POA: Diagnosis not present

## 2021-03-05 DIAGNOSIS — I10 Essential (primary) hypertension: Secondary | ICD-10-CM | POA: Diagnosis not present

## 2021-03-05 DIAGNOSIS — E559 Vitamin D deficiency, unspecified: Secondary | ICD-10-CM | POA: Diagnosis not present

## 2021-03-05 DIAGNOSIS — E669 Obesity, unspecified: Secondary | ICD-10-CM

## 2021-03-05 MED ORDER — VITAMIN D (ERGOCALCIFEROL) 1.25 MG (50000 UNIT) PO CAPS: 50000.0000 [IU] | ORAL_CAPSULE | ORAL | 0 refills | Status: DC

## 2021-03-06 NOTE — Progress Notes (Signed)
Chief Complaint:   OBESITY Gregory Wilkins is here to discuss his progress with his obesity treatment plan along with follow-up of his obesity related diagnoses.   Today's visit was #: 9 Starting weight: 230 lbs Starting date: 09/17/2020 Today's weight: 193 lbs Today's date: 03/05/2021 Weight change since last visit: 1 lb Total lbs lost to date: 37 lbs Body mass index is 28.5 kg/m.  Total weight loss percentage to date: -16.09%  Interim History:  Gregory Wilkins got in between 1200 and 1500 calories per day and 90 to 100 grams of protein per day for the past 8 days.  He is not sleeping as well as usual due to increased stress.  Current Meal Plan: the Category 3 Plan for 85% of the time.  Current Exercise Plan: Yoga/cardio/strength training for 30-60 minutes 2-3 times per week.  Assessment/Plan:   1. Other hyperlipidemia Course: Controlled. Lipid-lowering medications: Lipitor 10 mg daily.   Plan: Dietary changes: Increase soluble fiber, decrease simple carbohydrates, decrease saturated fat. Exercise changes: Moderate to vigorous-intensity aerobic activity 150 minutes per week or as tolerated. We will continue to monitor along with PCP/specialists as it pertains to his weight loss journey.  Lab Results  Component Value Date   CHOL 138 02/26/2021   HDL 60.40 02/26/2021   LDLCALC 70 02/26/2021   LDLDIRECT 124.6 11/10/2008   TRIG 38.0 02/26/2021   CHOLHDL 2 02/26/2021   Lab Results  Component Value Date   ALT 17 09/17/2020   AST 21 09/17/2020   ALKPHOS 87 09/17/2020   BILITOT 0.7 09/17/2020   The 10-year ASCVD risk score Gregory Wilkins., et al., 2013) is: 25.3%   Values used to calculate the score:     Age: 80 years     Sex: Male     Is Non-Hispanic African American: No     Diabetic: No     Tobacco smoker: No     Systolic Blood Pressure: [id number] mmHg     Is BP treated: Yes     HDL Cholesterol: 60.4 mg/dL     Total Cholesterol: 138 mg/dL  2. Vitamin D deficiency Not at goal.  He is taking  vitamin D 50,000 IU weekly.  Plan: Continue to take prescription Vitamin D '@50'$ ,000 IU every week as prescribed.  Follow-up for routine testing of Vitamin D, at least 2-3 times per year to avoid over-replacement.  Lab Results  Component Value Date   VD25OH 22.6 (L) 09/17/2020   - Refill Vitamin D, Ergocalciferol, (DRISDOL) 1.25 MG (50000 UNIT) CAPS capsule; Take 1 capsule (50,000 Units total) by mouth every 7 (seven) days.  Dispense: 4 capsule; Refill: 0  3. Essential hypertension At goal. Medications: HCTZ 12.5 mg daily, metoprolol 50 mg daily, losartan 25 mg daily.   Plan: Avoid buying foods that are: processed, frozen, or prepackaged to avoid excess salt. We will watch for signs of hypotension as he continues lifestyle modifications.  BP Readings from Last 3 Encounters:  03/05/21 (!) 115/55  02/26/21 122/64  02/07/21 123/74   Lab Results  Component Value Date   CREATININE 1.03 02/26/2021   4. Obesity, current BMI 28.6  Course: Gregory Wilkins is currently in the action stage of change. As such, his goal is to continue with weight loss efforts.   Nutrition goals: He has agreed to keeping a food journal and adhering to recommended goals of 1200-1500 calories and 90-100 grams of protein.   Exercise goals:  As is.  Behavioral modification strategies: emotional eating strategies.  Gregory Wilkins  has agreed to follow-up with our clinic in 2-3 weeks. He was informed of the importance of frequent follow-up visits to maximize his success with intensive lifestyle modifications for his multiple health conditions.   Objective:   Blood pressure (!) 115/55, pulse (!) 51, temperature 98 F (36.7 C), temperature source Oral, height '5\' 9"'$  (1.753 m), weight 193 lb (87.5 kg), SpO2 94 %. Body mass index is 28.5 kg/m.  General: Cooperative, alert, well developed, in no acute distress. HEENT: Conjunctivae and lids unremarkable. Cardiovascular: Regular rhythm.  Lungs: Normal work of breathing. Neurologic: No  focal deficits.   Lab Results  Component Value Date   CREATININE 1.03 02/26/2021   BUN 18 02/26/2021   NA 140 02/26/2021   K 4.3 02/26/2021   CL 103 02/26/2021   CO2 27 02/26/2021   Lab Results  Component Value Date   ALT 17 09/17/2020   AST 21 09/17/2020   ALKPHOS 87 09/17/2020   BILITOT 0.7 09/17/2020   Lab Results  Component Value Date   HGBA1C 5.3 09/17/2020   HGBA1C 5.3 11/10/2008   Lab Results  Component Value Date   INSULIN 9.4 09/17/2020   Lab Results  Component Value Date   TSH 1.600 09/17/2020   Lab Results  Component Value Date   CHOL 138 02/26/2021   HDL 60.40 02/26/2021   LDLCALC 70 02/26/2021   LDLDIRECT 124.6 11/10/2008   TRIG 38.0 02/26/2021   CHOLHDL 2 02/26/2021   Lab Results  Component Value Date   VD25OH 22.6 (L) 09/17/2020   Lab Results  Component Value Date   WBC 6.5 08/28/2020   HGB 14.5 08/28/2020   HCT 43.8 08/28/2020   MCV 90.3 08/28/2020   PLT 204.0 08/28/2020   Lab Results  Component Value Date   IRON 96 04/19/2015   FERRITIN 36.2 04/19/2015   Obesity Behavioral Intervention:   Approximately 15 minutes were spent on the discussion below.  ASK: We discussed the diagnosis of obesity with Gregory Wilkins today and Gregory Wilkins agreed to give [place] permission to discuss obesity behavioral modification therapy today.  ASSESS: Gregory Wilkins has the diagnosis of obesity and his BMI today is 28.6. Gregory Wilkins is in the action stage of change.   ADVISE: [name] was educated on the multiple health risks of obesity as well as the benefit of weight loss to improve his health. He was advised of the need for long term treatment and the importance of lifestyle modifications to improve his current health and to decrease his risk of future health problems.  AGREE: Multiple dietary modification options and treatment options were discussed and Gregory Wilkins agreed to follow the recommendations documented in the above note.  ARRANGE: Gregory Wilkins was educated on the importance of frequent  visits to treat obesity as outlined per CMS and USPSTF guidelines and agreed to schedule his next follow up appointment today.  Attestation Statements:   Reviewed by clinician on day of visit: allergies, medications, problem list, medical history, surgical history, family history, social history, and previous encounter notes.  Gregory Wilkins, Water quality scientist, CMA, am acting as transcriptionist for Briscoe Deutscher, DO  Gregory Wilkins have reviewed the above documentation for accuracy and completeness, and Gregory Wilkins agree with the above. Briscoe Deutscher, DO

## 2021-03-13 ENCOUNTER — Ambulatory Visit (INDEPENDENT_AMBULATORY_CARE_PROVIDER_SITE_OTHER): Payer: Medicare Other | Admitting: Psychology

## 2021-03-13 DIAGNOSIS — F4322 Adjustment disorder with anxiety: Secondary | ICD-10-CM

## 2021-03-19 ENCOUNTER — Ambulatory Visit (INDEPENDENT_AMBULATORY_CARE_PROVIDER_SITE_OTHER): Payer: Medicare Other | Admitting: Bariatrics

## 2021-03-26 ENCOUNTER — Other Ambulatory Visit (HOSPITAL_BASED_OUTPATIENT_CLINIC_OR_DEPARTMENT_OTHER): Payer: Self-pay

## 2021-03-27 ENCOUNTER — Ambulatory Visit (INDEPENDENT_AMBULATORY_CARE_PROVIDER_SITE_OTHER): Payer: Medicare Other | Admitting: Psychology

## 2021-03-27 DIAGNOSIS — F4322 Adjustment disorder with anxiety: Secondary | ICD-10-CM

## 2021-04-03 ENCOUNTER — Ambulatory Visit (INDEPENDENT_AMBULATORY_CARE_PROVIDER_SITE_OTHER): Payer: Medicare Other | Admitting: Bariatrics

## 2021-04-03 ENCOUNTER — Encounter (INDEPENDENT_AMBULATORY_CARE_PROVIDER_SITE_OTHER): Payer: Self-pay | Admitting: Bariatrics

## 2021-04-03 ENCOUNTER — Other Ambulatory Visit: Payer: Self-pay

## 2021-04-03 VITALS — BP 133/73 | HR 53 | Temp 97.9°F | Ht 69.0 in | Wt 189.0 lb

## 2021-04-03 DIAGNOSIS — I10 Essential (primary) hypertension: Secondary | ICD-10-CM

## 2021-04-03 DIAGNOSIS — E669 Obesity, unspecified: Secondary | ICD-10-CM

## 2021-04-03 DIAGNOSIS — Z6833 Body mass index (BMI) 33.0-33.9, adult: Secondary | ICD-10-CM

## 2021-04-03 DIAGNOSIS — E559 Vitamin D deficiency, unspecified: Secondary | ICD-10-CM

## 2021-04-03 MED ORDER — VITAMIN D (ERGOCALCIFEROL) 1.25 MG (50000 UNIT) PO CAPS: 50000.0000 [IU] | ORAL_CAPSULE | ORAL | 0 refills | Status: DC

## 2021-04-04 NOTE — Progress Notes (Signed)
Chief Complaint:   OBESITY Gregory Wilkins is here to discuss his progress with his obesity treatment plan along with follow-up of his obesity related diagnoses. Gregory Wilkins is on the Category 3 Plan and states he is following his eating plan approximately 75% of the time. Gregory Wilkins states he is doing 0 minutes 0 times per week.  Today's visit was #: 10 Starting weight: 230 lbs Starting date: 09/17/2020 Today's weight: 189 lbs Today's date: 04/03/2021 Total lbs lost to date: 41 lbs Total lbs lost since last in-office visit: 4 lbs  Interim History: Gregory Wilkins is down another 4 lbs and doing well overall. He is doing well with his water and protein.  Subjective:   1. Vitamin D deficiency Gregory Wilkins is currently taking his medication as directed.  2. Essential hypertension Gregory Wilkins's hypertension is well controlled.  Assessment/Plan:   1. Vitamin D deficiency Low Vitamin D level contributes to fatigue and are associated with obesity, breast, and colon cancer. We will refill prescription Vitamin D 50,000 IU every week for 1 month with no refills and Gregory Wilkins will follow-up for routine testing of Vitamin D, at least 2-3 times per year to avoid over-replacement.  - Vitamin D, Ergocalciferol, (DRISDOL) 1.25 MG (50000 UNIT) CAPS capsule; Take 1 capsule (50,000 Units total) by mouth every 7 (seven) days.  Dispense: 4 capsule; Refill: 0  2. Essential hypertension Gregory Wilkins will continue his medication. He is working on healthy weight loss and exercise to improve blood pressure control. We will watch for signs of hypotension as he continues his lifestyle modifications.   3. Obesity, current BMI 27.9 Gregory Wilkins is currently in the action stage of change. As such, his goal is to continue with weight loss efforts. He has agreed to the Category 3 Plan.   Gregory Wilkins will continue meal planning. He will continue intentional eating. He will stay close to potential goal.   Exercise goals:  Gregory Wilkins will start yoga and cardio. He will do some weights to  tone the body.  Behavioral modification strategies: increasing lean protein intake, decreasing simple carbohydrates, increasing vegetables, increasing water intake, decreasing eating out, no skipping meals, meal planning and cooking strategies, keeping healthy foods in the home, and planning for success.  Gregory Wilkins has agreed to follow-up with our clinic in 4 weeks. He was informed of the importance of frequent follow-up visits to maximize his success with intensive lifestyle modifications for his multiple health conditions.   Objective:   Blood pressure 133/73, pulse (!) 53, temperature 97.9 F (36.6 C), height '5\' 9"'$  (1.753 m), weight 189 lb (85.7 kg), SpO2 96 %. Body mass index is 27.91 kg/m.  General: Cooperative, alert, well developed, in no acute distress. HEENT: Conjunctivae and lids unremarkable. Cardiovascular: Regular rhythm.  Lungs: Normal work of breathing. Neurologic: No focal deficits.   Lab Results  Component Value Date   CREATININE 1.03 02/26/2021   BUN 18 02/26/2021   NA 140 02/26/2021   K 4.3 02/26/2021   CL 103 02/26/2021   CO2 27 02/26/2021   Lab Results  Component Value Date   ALT 17 09/17/2020   AST 21 09/17/2020   ALKPHOS 87 09/17/2020   BILITOT 0.7 09/17/2020   Lab Results  Component Value Date   HGBA1C 5.3 09/17/2020   HGBA1C 5.3 11/10/2008   Lab Results  Component Value Date   INSULIN 9.4 09/17/2020   Lab Results  Component Value Date   TSH 1.600 09/17/2020   Lab Results  Component Value Date   CHOL 138 02/26/2021  HDL 60.40 02/26/2021   LDLCALC 70 02/26/2021   LDLDIRECT 124.6 11/10/2008   TRIG 38.0 02/26/2021   CHOLHDL 2 02/26/2021   Lab Results  Component Value Date   VD25OH 22.6 (L) 09/17/2020   Lab Results  Component Value Date   WBC 6.5 08/28/2020   HGB 14.5 08/28/2020   HCT 43.8 08/28/2020   MCV 90.3 08/28/2020   PLT 204.0 08/28/2020   Lab Results  Component Value Date   IRON 96 04/19/2015   FERRITIN 36.2 04/19/2015     Obesity Behavioral Intervention:   Approximately 15 minutes were spent on the discussion below.  ASK: We discussed the diagnosis of obesity with Gregory Wilkins today and Gregory Wilkins agreed to give [place] permission to discuss obesity behavioral modification therapy today.  ASSESS: Gregory Wilkins has the diagnosis of obesity and his BMI today is 27.9. Gregory Wilkins is in the action stage of change.   ADVISE: Gregory Wilkins was educated on the multiple health risks of obesity as well as the benefit of weight loss to improve his health. He was advised of the need for long term treatment and the importance of lifestyle modifications to improve his current health and to decrease his risk of future health problems.  AGREE: Multiple dietary modification options and treatment options were discussed and Gregory Wilkins agreed to follow the recommendations documented in the above note.  ARRANGE: Gregory Wilkins was educated on the importance of frequent visits to treat obesity as outlined per CMS and USPSTF guidelines and agreed to schedule his next follow up appointment today.  Attestation Statements:   Reviewed by clinician on day of visit: allergies, medications, problem list, medical history, surgical history, family history, social history, and previous encounter notes.    I, Lizbeth Bark, RMA, am acting as Location manager for [employer], DO.   I have reviewed the above documentation for accuracy and completeness, and I agree with the above. Jearld Lesch, DO

## 2021-04-05 ENCOUNTER — Telehealth (INDEPENDENT_AMBULATORY_CARE_PROVIDER_SITE_OTHER): Payer: Medicare Other | Admitting: Family Medicine

## 2021-04-05 ENCOUNTER — Encounter: Payer: Self-pay | Admitting: Family Medicine

## 2021-04-05 ENCOUNTER — Other Ambulatory Visit: Payer: Self-pay | Admitting: Family Medicine

## 2021-04-05 ENCOUNTER — Other Ambulatory Visit: Payer: Self-pay

## 2021-04-05 ENCOUNTER — Telehealth: Payer: Self-pay | Admitting: Internal Medicine

## 2021-04-05 VITALS — BP 124/84 | Temp 98.6°F

## 2021-04-05 DIAGNOSIS — I2510 Atherosclerotic heart disease of native coronary artery without angina pectoris: Secondary | ICD-10-CM | POA: Diagnosis not present

## 2021-04-05 DIAGNOSIS — U071 COVID-19: Secondary | ICD-10-CM

## 2021-04-05 DIAGNOSIS — I2584 Coronary atherosclerosis due to calcified coronary lesion: Secondary | ICD-10-CM

## 2021-04-05 DIAGNOSIS — R059 Cough, unspecified: Secondary | ICD-10-CM | POA: Diagnosis not present

## 2021-04-05 HISTORY — DX: COVID-19: U07.1

## 2021-04-05 MED ORDER — HYDROCODONE BIT-HOMATROP MBR 5-1.5 MG/5ML PO SOLN: 5.0000 mL | Freq: Three times a day (TID) | ORAL | 0 refills | Status: DC | PRN

## 2021-04-05 MED ORDER — NIRMATRELVIR/RITONAVIR (PAXLOVID)TABLET: 3.0000 | ORAL_TABLET | Freq: Two times a day (BID) | ORAL | 0 refills | Status: DC

## 2021-04-05 MED ORDER — MOLNUPIRAVIR EUA 200MG CAPSULE: 4.0000 | ORAL_CAPSULE | Freq: Two times a day (BID) | ORAL | 0 refills | Status: AC

## 2021-04-05 NOTE — Telephone Encounter (Signed)
Will defer to Dr. Etter Sjogren- she saw him today.

## 2021-04-05 NOTE — Telephone Encounter (Signed)
Rx changed to molnupiravir.

## 2021-04-05 NOTE — Progress Notes (Signed)
MyChart Video Visit    Virtual Visit via Video Note   This visit type was conducted due to national recommendations for restrictions regarding the COVID-19 Pandemic (e.g. social distancing) in an effort to limit this patient's exposure and mitigate transmission in our community. This patient is at least at moderate risk for complications without adequate follow up. This format is felt to be most appropriate for this patient at this time. Physical exam was limited by quality of the video and audio technology used for the visit. Kristine Garbe Creft was able to get the patient set up on a video visit.  Patient location: home Patient and provider in visit Provider location: Office  I discussed the limitations of evaluation and management by telemedicine and the availability of in person appointments. The patient expressed understanding and agreed to proceed.  Visit Date: 04/05/2021  Today's healthcare provider: Ann Held, DO      Subjective:    Patient ID: Gregory Wilkins, male    DOB: [birth year], 80 y.o.   MRN: [id number]  No chief complaint on file.   HPI Patient is in today for a video visit. He tested positive for Covid-19 on 04/04/2021 after having a fever. He mentions that his temperature has dropped to 98.6 and he is experiencing a cough, headaches and has a metallic taste in his mouth.  Oxygen-98 Blood pressure- 125/74    Past Medical History:  Diagnosis Date   Acne rosacea 12/23/2006   Qualifier: Diagnosis of  By: Linna Darner MD, Gwyndolyn Saxon   Dr Crista Luria   Formatting of this note might be different from the original. Overview:  Qualifier: Diagnosis of  By: Linna Darner MD, Gwyndolyn Saxon   Dr Crista Luria Overview:  Overview:  Qualifier: Diagnosis of  By: Linna Darner MD, Gwyndolyn Saxon   Dr Crista Luria  Overview:  Qualifier: Diagnosis of  By: Linna Darner MD, Gwyndolyn Saxon   Dr Crista Luria Overview:  Overview:  Overview:   Alcohol abuse    Allergic rhinitis    Allergic rhinitis 12/23/2006    Qualifier: Diagnosis of  By: Linna Darner MD, Gwyndolyn Saxon   Onset:in high school Character: perennial but increased seasonally Triggers :cat, dust, pollen Allergy testing: Dr Bernita Buffy Maintenance medications/ response:Flonase/ Nasonex; saline wash; Allegra Smoking history:age 56- 62, up to 2 ppd Family history pulmonary disease: no    Formatting of this note might be different from the original. 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 Overview:  Qualifier: Diagnosis of  By: Linna Darner MD, Susann Givens of this note might be different from the original. Overview:  Qualifier: Diagnosis of  By: Linna Darner MD, William  IMO 10/01 Updates   Enlarged prostate without lower urinary tract symptoms (luts) 11/09/2008   Formatting of this note might be different from the original. Overview:  Qualifier:  Diagnosis of  By: Linna Darner MD, Gwyndolyn Saxon   No PMH of elevated PSA ; no biopsy Overview:  Overview:  Qualifier: Diagnosis of  By: Linna Darner MD, Gwyndolyn Saxon   No PMH of elevated PSA ; no biopsy Overview:  Overview:  Overview:  Qualifier: Diagnosis of  By: Linna Darner MD, Gwyndolyn Saxon   No PMH of elevated PSA ; no biopsy   Essential (primary) hypertension 09/21/2007   Qualifier: Diagnosis of  By: Linna Darner MD, Susann Givens of this note might be different from the original. Overview:  Qualifier: Diagnosis of  By: Linna Darner MD, [place]:  Blood pressure appears to be well  controlled. Renal function will be checked.  He has a minor cough; this does not appear to be significant enough to warrant change to losartan Overview:  Overview:  Qu   Family history of type C viral hepatitis 06/30/2012   Brother had liver transplant    Hepatitis A 04/2019   History of colonic polyps 12/23/2006   Qualifier: Diagnosis of  By: Linna Darner MD, Gwyndolyn Saxon   Dr Earlie Raveling, GI Polypectomy X2 , 4/16 /2014. One was  transitional cell adenoma Due annually No FH colon cancer   Formatting of this note might be different from the original. Overview:  Qualifier: Diagnosis of  By: Linna Darner MD, Gwyndolyn Saxon   Dr Earlie Raveling, GI Polypectomy X2 , 4/16 /2014. One was  transitional cell adenoma Due annually No FH colon ca   History of urinary stone 09/21/2007   Qualifier: Diagnosis of  By: Linna Darner MD, Jessie Foot , passed spontaneously No Urologist involvement   Formatting of this note might be different from the original. Overview:  Qualifier: Diagnosis of  By: Linna Darner MD, Jessie Foot , passed spontaneously No Urologist involvement Overview:  Overview:  Qualifier: Diagnosis of  By: Linna Darner MD, Jessie Foot , passed spontaneously No Urologist involvement    HYPERLIPIDEMIA 09/21/2007   Qualifier: Diagnosis of  By: Linna Darner MD, Cristopher Estimable Lipoprofile 2006 : LDL  108 ( 1072  / 575  ),  HDL 63 , Triglycerides  40. LDL goal = < 135 ; ideally < 105. No FH CAD   Formatting of this note might be different from the original. Overview:  Qualifier: Diagnosis of  By: Linna Darner MD, Cristopher Estimable Lipoprofile 2006 : LDL  108 ( 1072  / 575  ),  HDL 63 , Triglycerides  40. LDL goal = < 135 ; idea   Hypertension    Joint pain    Liver injury 04/2019   Liver injury, initial encounter 05/17/2019   Macular degeneration 01/06/2018   Malignant neoplasm of skin    Mallet finger 2012     4th L DIP,Dr Sypher   Mixed dyslipidemia 03/23/2020   Nephrolithiasis    Nocturnal hypoxia 11/04/2019   Nuclear sclerotic cataract of both eyes    Obesity  (BMI 30-39.9) 01/04/2014   OSA (obstructive sleep apnea) 05/17/2020   Osteoarthritis    Overweight 03/23/2020   Pain in left foot 06/28/2020   PCP NOTES >>>>>>>>>>>>>>>> 05/26/2019   Personal history of other malignant neoplasm of skin 06/30/2012   Dr Wilhemina Bonito, Derm X3 Local resection from  right forearm and forehead.   Mohs surgery of the nose 06/2012, Dr Verlon Au of this note might be different from the original. Overview:  Dr Wilhemina Bonito, Derm X3 Local resection from  right forearm and forehead.  Mohs surgery of the nose 06/2012, Dr Sarajane Jews Overview:  Overview:  Dr Wilhemina Bonito, Derm X3 Local resection from  right forearm and fo   RLS (restless legs syndrome)    Sleep apnea    Snoring 02/01/2014   Formatting of this note might be different from the original. Last Assessment & Plan:  The patient has been noted to have significant snoring, but no one has ever really commented on an abnormal breathing pattern during sleep. Although he is overweight and does have restless sleep, the rest of his history is not overly impressive for sleep disordered breathing. If he does have sleep apnea, I suspe    Past Surgical History:  Procedure Laterality Date   basal cell cancer     X 3   INGUINAL HERNIA REPAIR  [age]   right,left   OPEN REDUCTION INTERNAL FIXATION (ORIF) METACARPAL Right 05/19/2013   Procedure: OPEN REDUCTION INTERNAL FIXATION  RIGHT SMALL  METACARPAL FRACTURE;  Surgeon: Cammie Sickle., MD;  Location: [facility];  Service: Orthopedics;  Laterality: Right;   POLYPECTOMY     X 3; Dr Earlean Shawl   WISDOM TOOTH EXTRACTION      Family History  Problem Relation Age of Onset   Diabetes Father    Kidney disease Father    Stroke Mother 60   Hypertension Mother    Alcohol abuse Mother    Obesity Mother    Heart disease Neg Hx    Cancer Neg Hx     Social History   Socioeconomic History   Marital status: Married    Spouse name: Butch Penny    Number of children: Not  on file   Years of education: Not on file   Highest education level: Not on file  Occupational History   Occupation: retired---Financial Analyst  Tobacco Use   Smoking status: Former    Packs/day: 2.00    Years: 26.00    Pack years: 52.00    Types: Cigarettes    Quit date: 07/29/1982    Years since quitting: 38.7   Smokeless tobacco: Never   Tobacco comments:    ages [age], up to 2 ppd  Vaping Use   Vaping Use: Never used  Substance and Sexual Activity   Alcohol use: No    Alcohol/week: 0.0 standard drinks   Drug use: No   Sexual activity: Not on file  Other Topics Concern   Not on file  Social History Narrative   Household: pt, wife, child   Social Determinants of Health   Financial Resource Strain: Not on file  Food Insecurity: Not on file  Transportation Needs: Not on file  Physical Activity: Not on file  Stress: Not on file  Social Connections: Not on file  Intimate Partner Violence: Not on file    Outpatient Medications Prior to Visit  Medication Sig Dispense Refill   apixaban (ELIQUIS) 5 MG TABS tablet Take 5 mg by mouth 2 (two) times daily.     aspirin EC 81 MG tablet Take 1 tablet (81 mg total) by mouth daily. Swallow whole. 90 tablet 3   atorvastatin (LIPITOR) 10 MG tablet Take 1 tablet (10 mg total) by mouth at bedtime. 90 tablet 3   cetirizine (ZYRTEC) 10 MG tablet Take 10 mg by mouth daily.     clonazePAM (KLONOPIN) 0.5 MG tablet TAKE 1 TABLET BY MOUTH EVERY NIGHT AT BEDTIME AS NEEDED FOR RESTLESS LEG 90 tablet 1   hydrochlorothiazide (HYDRODIURIL) 12.5 MG tablet Take 1  tablet (12.5 mg total) by mouth daily. 90 tablet 1   losartan (COZAAR) 25 MG tablet Take 1 tablet (25 mg total) by mouth daily. 90 tablet 1   metoprolol tartrate (LOPRESSOR) 50 MG tablet Take 50 mg by mouth as needed (fast heart rate).     Multiple Vitamin (MULTIVITAMIN WITH MINERALS) TABS tablet Take 1 tablet by mouth daily.     Multiple Vitamins-Minerals (ICAPS AREDS 2 PO) Take 1 tablet by  mouth daily in the afternoon.     Tdap (BOOSTRIX) 5-2.5-18.5 LF-MCG/0.5 injection Inject into the muscle. 0.5 mL 0   Vitamin D, Ergocalciferol, (DRISDOL) 1.25 MG (50000 UNIT) CAPS capsule Take 1 capsule (50,000 Units total) by mouth every 7 (seven) days. 4 capsule 0   No facility-administered medications prior to visit.    Allergies  Allergen Reactions   Penicillins Hives    hives   Ramipril Anaphylaxis and Swelling    Review of Systems  Constitutional:  Positive for fever. Negative for malaise/fatigue.  HENT:  Negative for congestion.   Eyes:  Negative for blurred vision.  Respiratory:  Positive for cough and wheezing. Negative for shortness of breath.   Cardiovascular:  Negative for chest pain, palpitations and leg swelling.  Gastrointestinal:  Negative for vomiting.  Musculoskeletal:  Negative for back pain.  Skin:  Negative for rash.  Neurological:  Positive for headaches. Negative for loss of consciousness.      Objective:    Physical Exam Vitals and nursing note reviewed.  Constitutional:      Appearance: He is well-developed.  Eyes:     Pupils: Pupils are equal, round, and reactive to light.  Neck:     Thyroid: No thyromegaly.  Pulmonary:     Effort: Pulmonary effort is normal.  Musculoskeletal:     Right hip: Tenderness present. Normal range of motion. Normal strength.     Left hip: Tenderness present. Normal range of motion. Normal strength.     Right foot: Bony tenderness present. No swelling.     Left foot: Bony tenderness present. No swelling.  Neurological:     Mental Status: He is alert and oriented to person, place, and time.  Psychiatric:        Behavior: Behavior normal.        Thought Content: Thought content normal.        Judgment: Judgment normal.    BP 124/84   Temp 98.6 F (37 C)   SpO2 98%  Wt Readings from Last 3 Encounters:  04/03/21 189 lb (85.7 kg)  03/05/21 193 lb (87.5 kg)  02/26/21 199 lb (90.3 kg)    Diabetic Foot Exam -  Simple   No data filed    Lab Results  Component Value Date   WBC 6.5 08/28/2020   HGB 14.5 08/28/2020   HCT 43.8 08/28/2020   PLT 204.0 08/28/2020   GLUCOSE 84 02/26/2021   CHOL 138 02/26/2021   TRIG 38.0 02/26/2021   HDL 60.40 02/26/2021   LDLDIRECT 124.6 11/10/2008   LDLCALC 70 02/26/2021   ALT 17 09/17/2020   AST 21 09/17/2020   NA 140 02/26/2021   K 4.3 02/26/2021   CL 103 02/26/2021   CREATININE 1.03 02/26/2021   BUN 18 02/26/2021   CO2 27 02/26/2021   TSH 1.600 09/17/2020   PSA 1.66 12/23/2019   INR 1.4 (H) 06/21/2019   HGBA1C 5.3 09/17/2020   MICROALBUR <0.7 01/25/2020    Lab Results  Component Value Date   TSH 1.600 09/17/2020  Lab Results  Component Value Date   WBC 6.5 08/28/2020   HGB 14.5 08/28/2020   HCT 43.8 08/28/2020   MCV 90.3 08/28/2020   PLT 204.0 08/28/2020   Lab Results  Component Value Date   NA 140 02/26/2021   K 4.3 02/26/2021   CO2 27 02/26/2021   GLUCOSE 84 02/26/2021   BUN 18 02/26/2021   CREATININE 1.03 02/26/2021   BILITOT 0.7 09/17/2020   ALKPHOS 87 09/17/2020   AST 21 09/17/2020   ALT 17 09/17/2020   PROT 7.2 09/17/2020   ALBUMIN 4.5 09/17/2020   CALCIUM 9.5 02/26/2021   ANIONGAP 10 03/04/2020   GFR 69.78 02/26/2021   Lab Results  Component Value Date   CHOL 138 02/26/2021   Lab Results  Component Value Date   HDL 60.40 02/26/2021   Lab Results  Component Value Date   LDLCALC 70 02/26/2021   Lab Results  Component Value Date   TRIG 38.0 02/26/2021   Lab Results  Component Value Date   CHOLHDL 2 02/26/2021   Lab Results  Component Value Date   HGBA1C 5.3 09/17/2020       Assessment & Plan:   Problem List Items Addressed This Visit       Unprioritized   COVID-19 - Primary    molnupravir Cough meds  F/u with pcp prn        Other Visit Diagnoses     Cough       Relevant Medications   HYDROcodone bit-homatropine (HYCODAN) 5-1.5 MG/5ML syrup       Meds ordered this encounter   Medications   DISCONTD: nirmatrelvir/ritonavir EUA (PAXLOVID) 20 x 150 MG & 10 x '100MG'$  TABS    Sig: Take 3 tablets by mouth 2 (two) times daily for 5 days. (Take nirmatrelvir 150 mg two tablets twice daily for 5 days and ritonavir 100 mg one tablet twice daily for 5 days) Patient GFR is 69.7    Dispense:  30 tablet    Refill:  0   HYDROcodone bit-homatropine (HYCODAN) 5-1.5 MG/5ML syrup    Sig: Take 5 mLs by mouth every 8 (eight) hours as needed for cough.    Dispense:  120 mL    Refill:  0    I discussed the assessment and treatment plan with the patient. The patient was provided an opportunity to ask questions and all were answered. The patient agreed with the plan and demonstrated an understanding of the instructions.   The patient was advised to call back or seek an in-person evaluation if the symptoms worsen or if the condition fails to improve as anticipated.  I provided 20 minutes of face-to-face time during this encounter.Mabeline Caras as a scribe for Ann Held, DO.,have documented all relevant documentation on the behalf of Ann Held, DO,as directed by  Ann Held, DO while in the presence of [place], DO, have reviewed all documentation for this visit. The documentation on 04/05/21 for the exam, diagnosis, procedures, and orders are all accurate and complete.    Ann Held, DO [place] [facility] [phone] (phone) [phone] (fax)  [place]

## 2021-04-05 NOTE — Assessment & Plan Note (Signed)
molnupravir Cough meds  F/u with pcp prn

## 2021-04-05 NOTE — Telephone Encounter (Signed)
Tammy from [facility] called regarding recent medication orders for nirmatrelvir/ritonavir EUA (PAXLOVID) 20 x 150 MG & 10 x '100MG'$  TABS . She stated that there are 4 things it would interact with. She can be reached at [phone] if any questions. Please advise.

## 2021-04-07 ENCOUNTER — Encounter (INDEPENDENT_AMBULATORY_CARE_PROVIDER_SITE_OTHER): Payer: Self-pay | Admitting: Bariatrics

## 2021-04-10 ENCOUNTER — Telehealth: Payer: Self-pay | Admitting: Primary Care

## 2021-04-10 ENCOUNTER — Ambulatory Visit (INDEPENDENT_AMBULATORY_CARE_PROVIDER_SITE_OTHER): Payer: Medicare Other | Admitting: Psychology

## 2021-04-10 ENCOUNTER — Other Ambulatory Visit: Payer: Self-pay | Admitting: Cardiology

## 2021-04-10 DIAGNOSIS — F4322 Adjustment disorder with anxiety: Secondary | ICD-10-CM | POA: Diagnosis not present

## 2021-04-10 NOTE — Telephone Encounter (Signed)
Spoke with the pt  He states tested pos for covid 19 for the first time on 04/03/21, not the 13th as stated in previous msg  He was not hospitalized  Advised ok for appt 04/16/21

## 2021-04-10 NOTE — Telephone Encounter (Signed)
Prescription refill request for Eliquis received. Last office visit:revankar 10/12/20 Scr:1.03 02/26/21 Age: 81mWeight:85.7kg

## 2021-04-16 ENCOUNTER — Encounter: Payer: Self-pay | Admitting: Primary Care

## 2021-04-16 ENCOUNTER — Ambulatory Visit: Payer: Medicare Other | Admitting: Cardiology

## 2021-04-16 ENCOUNTER — Ambulatory Visit (INDEPENDENT_AMBULATORY_CARE_PROVIDER_SITE_OTHER): Payer: Medicare Other | Admitting: Primary Care

## 2021-04-16 ENCOUNTER — Other Ambulatory Visit: Payer: Self-pay

## 2021-04-16 DIAGNOSIS — G4733 Obstructive sleep apnea (adult) (pediatric): Secondary | ICD-10-CM

## 2021-04-16 DIAGNOSIS — J309 Allergic rhinitis, unspecified: Secondary | ICD-10-CM

## 2021-04-16 NOTE — Progress Notes (Signed)
@Patient  ID: Gregory Wilkins, male    DOB: 23-Jan-1941, 80 y.o.   MRN: [id number]  Chief Complaint  Patient presents with   Follow-up    Patient reports that he feels claustrophobia while wearing and he will take the machine off during the night.      Referring provider: Colon Branch, MD  HPI: 80 year old male, former smoker quit 1984 (52-pack-year history).  Past medical history significant for obstructive sleep apnea, nocturnal hypoxia, allergic rhinitis, hypertension, a flutter, mixed dyslipidemia, restless leg syndrome, skin cancer, obesity.  Patient of Dr. Elsworth Soho, seen for initial consult on 11/04/2019.  Home sleep study 04/03/20 showed severe obstructive sleep apnea, AHI 57/hr.  CPAP titration completed 05/17/20, recommended pressure 9 cm H20 with no oxygen requirement.    Previous [facility] pulmonary encounter: 10/11/2020 Patient presents today for 3 month follow-up/new CPAP start. He reports compliance with CPAP. He brought his SD card today but there was not data. He is having no issues with mask or pressure setting. He reports a fair about of sinus drainage at night. He is wearing a full face mask. He gets about 6 hours of sleep each night. Reports that he is waking up less at night. He is working on weight loss. He has been tired a couple of gyms after going to the gym which is new for him. He is on a 1,000 calorie diet. Epworth score today was 3/24. DME company is [employer].   Compliance summary 08/16/2020-11/13/2020 Days used 60/90 days (67.8%) Average usage 7 hours 19 minutes Average pressure 9 cm H2O Average air leak 9.2 L/min Average AHI 8.8  Would recommend increasing treatment pressure to 10cm h20  04/16/2021- Interim hx  Patient presents today for 6 month follow-up. Home sleep study 04/03/20 showed severe obstructive sleep apnea, AHI 57/hr. During last visit we increased CPAP pressure to 10cm h20.   He is doing well. No issues with CPAP mask or pressure setting. Sleeping well at  night, he wakes up on average twice to use the restroom. He gets on average 7-8 hours of sleep at night. Goes to bed around midnight and get up at 7-8am. He rarely experiences daytime fatigue. He reports having less night terrors. He regularly attends counseling /therapy. He will take his mask off in the middle of the night on occasions. He has had a lot of head/sinus congestion the last week. He self manages his symptom; taking Zyrtec 10mg , saline rinses along with prn mucinex and flonase   IcodeConnect 08/16/20-11/13/20 89/90 used; 97% > 4 hours  Average usage 7 hours 23 mins Pressure 10cm h20 Average leak NA AHI 8.0    Allergies  Allergen Reactions   Penicillins Hives    hives   Ramipril Anaphylaxis and Swelling    Immunization History  Administered Date(s) Administered   Influenza Split 05/28/2013   Influenza, High Dose Seasonal PF 06/02/2014, 04/08/2018, 03/07/2019, 04/17/2020   Influenza, Seasonal, Injecte, Preservative Fre 06/30/2012   Influenza,inj,Quad PF,6+ Mos 04/19/2015   PFIZER Comirnaty(Gray Top)Covid-19 Tri-Sucrose Vaccine 11/21/2020   PFIZER(Purple Top)SARS-COV-2 Vaccination 08/10/2019, 08/30/2019, 04/24/2020   Pneumococcal Conjugate-13 04/29/2016   Pneumococcal Polysaccharide-23 05/09/2010, 08/28/2020   Td 12/26/2005, 01/09/2011   Tdap 02/26/2021   Zoster Recombinat (Shingrix) 08/26/2018, 03/07/2019   Zoster, Live 11/09/2008    Past Medical History:  Diagnosis Date   Acne rosacea 12/23/2006   Qualifier: Diagnosis of  By: Linna Darner MD, Gwyndolyn Saxon   Dr Crista Luria   Formatting of this note might be different from the  original. Overview:  Qualifier: Diagnosis of  By: Linna Darner MD, Gwyndolyn Saxon   Dr Crista Luria Overview:  Overview:  Qualifier: Diagnosis of  By: Linna Darner MD, Gwyndolyn Saxon   Dr Crista Luria  Overview:  Qualifier: Diagnosis of  By: Linna Darner MD, Gwyndolyn Saxon   Dr Crista Luria Overview:  Overview:  Overview:   Alcohol abuse    Allergic rhinitis    Allergic rhinitis 12/23/2006    Qualifier: Diagnosis of  By: Linna Darner MD, Gwyndolyn Saxon   Onset:in high school Character: perennial but increased seasonally Triggers :cat, dust, pollen Allergy testing: Dr Bernita Buffy Maintenance medications/ response:Flonase/ Nasonex; saline wash; Allegra Smoking history:age 66- 44, up to 2 ppd Family history pulmonary disease: no    Formatting of this note might be different from the original. 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 Overview:  Qualifier: Diagnosis of  By: Linna Darner MD, Susann Givens of this note might be different from the original. Overview:  Qualifier: Diagnosis of  By: Linna Darner MD, William  IMO 10/01 Updates   Enlarged prostate without lower urinary tract symptoms (luts) 11/09/2008   Formatting of this note might be different from the original. Overview:  Qualifier: Diagnosis of  By: Linna Darner MD, Gwyndolyn Saxon   No PMH of elevated PSA ; no biopsy Overview:  Overview:  Qualifier: Diagnosis of  By: Linna Darner MD, Gwyndolyn Saxon   No PMH of elevated PSA ; no biopsy Overview:  Overview:  Overview:  Qualifier: Diagnosis of  By: Linna Darner MD, Gwyndolyn Saxon   No PMH of elevated PSA ; no biopsy   Essential (primary) hypertension 09/21/2007   Qualifier: Diagnosis of  By: Linna Darner MD, Susann Givens of this note might be different from the original. Overview:  Qualifier: Diagnosis of  By: Linna Darner MD, [place]:  Blood pressure appears to be well  controlled. Renal function will be checked.  He has a minor cough; this does not appear to be significant enough to warrant change to losartan Overview:  Overview:  Qu   Family history of type C viral hepatitis 06/30/2012   Brother had liver transplant    Hepatitis A 04/2019   History of colonic polyps 12/23/2006   Qualifier: Diagnosis of  By: Linna Darner MD, Gwyndolyn Saxon   Dr Earlie Raveling, GI Polypectomy X2 , 4/16 /2014. One was  transitional cell adenoma Due annually No FH colon cancer   Formatting of this note might be different from the original. Overview:  Qualifier: Diagnosis of  By: Linna Darner MD, Gwyndolyn Saxon   Dr Earlie Raveling, GI Polypectomy X2 , 4/16 /2014. One was  transitional cell adenoma Due annually No FH colon ca   History of urinary stone 09/21/2007   Qualifier: Diagnosis of  By: Linna Darner MD, Jessie Foot , passed spontaneously No Urologist involvement  Formatting of this note might be different from the original. Overview:  Qualifier: Diagnosis of  By: Linna Darner MD, Jessie Foot , passed spontaneously No Urologist involvement Overview:  Overview:  Qualifier: Diagnosis of  By: Linna Darner MD, Jessie Foot , passed spontaneously No Urologist involvement    HYPERLIPIDEMIA 09/21/2007   Qualifier: Diagnosis of  By: Linna Darner MD, Cristopher Estimable Lipoprofile 2006 : LDL  108 ( 1072  / 575  ),  HDL 63 , Triglycerides  40. LDL goal = < 135 ; ideally < 105. No FH CAD   Formatting of this note might be different from the original. Overview:  Qualifier: Diagnosis of  By: Linna Darner MD, Cristopher Estimable Lipoprofile 2006 : LDL  108 ( 1072  / 575  ),  HDL 63 , Triglycerides  40. LDL goal = < 135 ; idea   Hypertension    Joint pain    Liver injury 04/2019   Liver injury, initial encounter 05/17/2019   Macular degeneration 01/06/2018   Malignant neoplasm of skin    Mallet finger 2012     4th L DIP,Dr Sypher   Mixed dyslipidemia 03/23/2020   Nephrolithiasis    Nocturnal hypoxia 11/04/2019   Nuclear sclerotic cataract of both eyes    Obesity  (BMI 30-39.9) 01/04/2014   OSA (obstructive sleep apnea) 05/17/2020   Osteoarthritis    Overweight 03/23/2020   Pain in left foot 06/28/2020   PCP NOTES >>>>>>>>>>>>>>>> 05/26/2019   Personal history of other malignant neoplasm of skin 06/30/2012   Dr Wilhemina Bonito, Derm X3 Local resection from  right forearm and forehead.   Mohs surgery of the nose 06/2012, Dr Verlon Au of this note might be different from the original. Overview:  Dr Wilhemina Bonito, Derm X3 Local resection from  right forearm and forehead.   Mohs surgery of the nose 06/2012, Dr Sarajane Jews Overview:  Overview:  Dr Wilhemina Bonito, Derm X3 Local resection from  right forearm and fo   RLS (restless legs syndrome)    Sleep apnea    Snoring 02/01/2014   Formatting of this note might be different from the original. Last Assessment & Plan:  The patient has been noted to have significant snoring, but no one has ever really commented on an abnormal breathing pattern during sleep. Although he is overweight and does have restless sleep, the rest of his history is not overly impressive for sleep disordered breathing. If he does have sleep apnea, I suspe    Tobacco History: Social History   Tobacco Use  Smoking Status Former   Packs/day: 2.00   Years: 26.00   Pack years: 52.00   Types: Cigarettes   Quit date: 07/29/1982   Years since quitting: 38.7  Smokeless Tobacco Never  Tobacco Comments   ages [age], up to 2 ppd   Counseling given: Not Answered Tobacco comments: ages [age], up to 2 ppd   Outpatient Medications Prior to Visit  Medication Sig Dispense Refill   aspirin EC 81 MG tablet Take 1 tablet (81 mg total) by mouth daily. Swallow whole. 90 tablet 3   atorvastatin (LIPITOR) 10 MG tablet Take 1 tablet (10 mg total) by mouth at bedtime. 90 tablet 3   cetirizine (ZYRTEC) 10 MG tablet Take 10 mg by mouth daily.     clonazePAM (KLONOPIN) 0.5 MG tablet TAKE 1 TABLET BY MOUTH EVERY NIGHT AT BEDTIME AS NEEDED FOR RESTLESS LEG 90 tablet 1    ELIQUIS 5  MG TABS tablet TAKE 1 TABLET BY MOUTH TWICE A DAY 180 tablet 1   hydrochlorothiazide (HYDRODIURIL) 12.5 MG tablet Take 1 tablet (12.5 mg total) by mouth daily. 90 tablet 1   HYDROcodone bit-homatropine (HYCODAN) 5-1.5 MG/5ML syrup Take 5 mLs by mouth every 8 (eight) hours as needed for cough. 120 mL 0   losartan (COZAAR) 25 MG tablet Take 1 tablet (25 mg total) by mouth daily. 90 tablet 1   metoprolol tartrate (LOPRESSOR) 50 MG tablet Take 50 mg by mouth as needed (fast heart rate).     Multiple Vitamin (MULTIVITAMIN WITH MINERALS) TABS tablet Take 1 tablet by mouth daily.     Multiple Vitamins-Minerals (ICAPS AREDS 2 PO) Take 1 tablet by mouth daily in the afternoon.     Vitamin D, Ergocalciferol, (DRISDOL) 1.25 MG (50000 UNIT) CAPS capsule Take 1 capsule (50,000 Units total) by mouth every 7 (seven) days. 4 capsule 0   Tdap (BOOSTRIX) 5-2.5-18.5 LF-MCG/0.5 injection Inject into the muscle. (Patient not taking: Reported on 04/16/2021) 0.5 mL 0   No facility-administered medications prior to visit.    Review of Systems  Review of Systems  Constitutional: Negative.   HENT:  Positive for congestion and postnasal drip.   Respiratory:  Negative for cough, chest tightness, shortness of breath and wheezing.     Physical Exam  BP 112/72 (BP Location: Left Arm, Patient Position: Sitting, Cuff Size: Normal)   Pulse 67   Temp 98.1 F (36.7 C) (Oral)   Ht 5\' 9"  (1.753 m)   Wt 191 lb 12.8 oz (87 kg)   SpO2 97%   BMI 28.32 kg/m  Physical Exam Constitutional:      Appearance: Normal appearance.  HENT:     Head: Normocephalic and atraumatic.     Mouth/Throat:     Mouth: Mucous membranes are moist.     Pharynx: Oropharynx is clear.  Cardiovascular:     Rate and Rhythm: Normal rate and regular rhythm.     Comments: RRR Pulmonary:     Effort: Pulmonary effort is normal.     Breath sounds: Normal breath sounds.     Comments: CTA Musculoskeletal:        General: Normal range  of motion.  Skin:    General: Skin is warm and dry.  Neurological:     General: No focal deficit present.     Mental Status: He is alert and oriented to person, place, and time. Mental status is at baseline.  Psychiatric:        Mood and Affect: Mood normal.        Behavior: Behavior normal.        Thought Content: Thought content normal.        Judgment: Judgment normal.     Lab Results:  CBC    Component Value Date/Time   WBC 6.5 08/28/2020 0837   RBC 4.85 08/28/2020 0837   HGB 14.5 08/28/2020 0837   HCT 43.8 08/28/2020 0837   PLT 204.0 08/28/2020 0837   MCV 90.3 08/28/2020 0837   MCH 30.5 03/04/2020 2131   MCHC 33.2 08/28/2020 0837   RDW 13.6 08/28/2020 0837   LYMPHSABS 1.4 08/28/2020 0837   MONOABS 0.8 08/28/2020 0837   EOSABS 0.3 08/28/2020 0837   BASOSABS 0.0 08/28/2020 0837    BMET    Component Value Date/Time   NA 140 02/26/2021 0826   NA 141 09/17/2020 1400   K 4.3 02/26/2021 0826   CL 103 02/26/2021 0826  CO2 27 02/26/2021 0826   GLUCOSE 84 02/26/2021 0826   BUN 18 02/26/2021 0826   BUN 11 09/17/2020 1400   CREATININE 1.03 02/26/2021 0826   CALCIUM 9.5 02/26/2021 0826   GFRNONAA 82 09/17/2020 1400   GFRAA 95 09/17/2020 1400    BNP No results found for: BNP  ProBNP No results found for: PROBNP  Imaging: No results found.   Assessment & Plan:   OSA (obstructive sleep apnea) - Home sleep study 04/03/20 showed severe obstructive sleep apnea, AHI 57/hr - Patient is 97% compliant with CPAP > 4 hours a night - He reports benefit from use; no significant daytime fatigue and having less night terrors  - CPAP pressure 10cm h20; residual AHI 8. No significant airleaks - No changes today - FU in 1 year or sooner if needed   Allergic rhinitis - He had allergy testing with Dr. Bernita Buffy. Triggers cat, dust and pollen.  - Continue Zyrtec, ocean nasal spray twice daily and prn mucinex   Martyn Ehrich, NP 04/16/2021

## 2021-04-16 NOTE — Assessment & Plan Note (Signed)
-   Home sleep study 04/03/20 showed severe obstructive sleep apnea, AHI 57/hr - Patient is 97% compliant with CPAP > 4 hours a night - He reports benefit from use; no significant daytime fatigue and having less night terrors  - CPAP pressure 10cm h20; residual AHI 8. No significant airleaks - No changes today - FU in 1 year or sooner if needed

## 2021-04-16 NOTE — Patient Instructions (Addendum)
Recommendations: Continue to wear CPAP every night for 4-6 hours or longer No change to pressure setting today  Follow-up: 12 months with Dr. Elsworth Soho or sooner if needed    CPAP and BPAP Information CPAP and BPAP (also called BiPAP) are methods that use air pressure to keep your airways open and to help you breathe well. CPAP and BPAP use different amounts of pressure. Your health care provider will tell you whether CPAP or BPAP would be more helpful for you. CPAP stands for "continuous positive airway pressure." With CPAP, the amount of pressure stays the same while you breathe in (inhale) and out (exhale). BPAP stands for "bi-level positive airway pressure." With BPAP, the amount of pressure will be higher when you inhale and lower when you exhale. This allows you to take larger breaths. CPAP or BPAP may be used in the hospital, or your health care provider may want you to use it at home. You may need to have a sleep study before your health care provider can order a machine for you to use at home. What are the advantages? CPAP or BPAP can be helpful if you have: Sleep apnea. Chronic obstructive pulmonary disease (COPD). Heart failure. Medical conditions that cause muscle weakness, including muscular dystrophy or amyotrophic lateral sclerosis (ALS). Other problems that cause breathing to be shallow, weak, abnormal, or difficult. CPAP and BPAP are most commonly used for obstructive sleep apnea (OSA) to keep the airways from collapsing when the muscles relax during sleep. What are the risks? Generally, this is a safe treatment. However, problems may occur, including: Irritated skin or skin sores if the mask does not fit properly. Dry or stuffy nose or nosebleeds. Dry mouth. Feeling gassy or bloated. Sinus or lung infection if the equipment is not cleaned properly. When should CPAP or BPAP be used? In most cases, the mask only needs to be worn during sleep. Generally, the mask needs to be  worn throughout the night and during any daytime naps. People with certain medical conditions may also need to wear the mask at other times, such as when they are awake. Follow instructions from your health care provider about when to use the machine. What happens during CPAP or BPAP? Both CPAP and BPAP are provided by a small machine with a flexible plastic tube that attaches to a plastic mask that you wear. Air is blown through the mask into your nose or mouth. The amount of pressure that is used to blow the air can be adjusted on the machine. Your health care provider will set the pressure setting and help you find the best mask for you. Tips for using the mask Because the mask needs to be snug, some people feel trapped or closed-in (claustrophobic) when first using the mask. If you feel this way, you may need to get used to the mask. One way to do this is to hold the mask loosely over your nose or mouth and then gradually apply the mask more snugly. You can also gradually increase the amount of time that you use the mask. Masks are available in various types and sizes. If your mask does not fit well, talk with your health care provider about getting a different one. Some common types of masks include: Full face masks, which fit over the mouth and nose. Nasal masks, which fit over the nose. Nasal pillow or prong masks, which fit into the nostrils. If you are using a mask that fits over your nose and you tend  to breathe through your mouth, a chin strap may be applied to help keep your mouth closed. Use a skin barrier to protect your skin as told by your health care provider. Some CPAP and BPAP machines have alarms that may sound if the mask comes off or develops a leak. If you have trouble with the mask, it is very important that you talk with your health care provider about finding a way to make the mask easier to tolerate. Do not stop using the mask. There could be a negative impact on your health if  you stop using the mask. Tips for using the machine Place your CPAP or BPAP machine on a secure table or stand near an electrical outlet. Know where the on/off switch is on the machine. Follow instructions from your health care provider about how to set the pressure on your machine and when you should use it. Do not eat or drink while the CPAP or BPAP machine is on. Food or fluids could get pushed into your lungs by the pressure of the CPAP or BPAP. For home use, CPAP and BPAP machines can be rented or purchased through home health care companies. Many different brands of machines are available. Renting a machine before purchasing may help you find out which particular machine works well for you. Your health insurance company may also decide which machine you may get. Keep the CPAP or BPAP machine and attachments clean. Ask your health care provider for specific instructions. Check the humidifier if you have a dry stuffy nose or nosebleeds. Make sure it is working correctly. Follow these instructions at home: Take over-the-counter and prescription medicines only as told by your health care provider. Ask if you can take sinus medicine if your sinuses are blocked. Do not use any products that contain nicotine or tobacco. These products include cigarettes, chewing tobacco, and vaping devices, such as e-cigarettes. If you need help quitting, ask your health care provider. Keep all follow-up visits. This is important. Contact a health care provider if: You have redness or pressure sores on your head, face, mouth, or nose from the mask or head gear. You have trouble using the CPAP or BPAP machine. You cannot tolerate wearing the CPAP or BPAP mask. Someone tells you that you snore even when wearing your CPAP or BPAP. Get help right away if: You have trouble breathing. You feel confused. Summary CPAP and BPAP are methods that use air pressure to keep your airways open and to help you breathe well. If  you have trouble with the mask, it is very important that you talk with your health care provider about finding a way to make the mask easier to tolerate. Do not stop using the mask. There could be a negative impact to your health if you stop using the mask. Follow instructions from your health care provider about when to use the machine. This information is not intended to replace advice given to you by your health care provider. Make sure you discuss any questions you have with your health care provider. Document Revised: 06/22/2020 Document Reviewed: 06/22/2020 Elsevier Patient Education  2022 [employer].

## 2021-04-16 NOTE — Assessment & Plan Note (Addendum)
-   He had allergy testing with Dr. Bernita Buffy. Triggers cat, dust and pollen.  - Continue Zyrtec, ocean nasal spray twice daily and prn mucinex

## 2021-04-24 ENCOUNTER — Ambulatory Visit (INDEPENDENT_AMBULATORY_CARE_PROVIDER_SITE_OTHER): Payer: Medicare Other | Admitting: Bariatrics

## 2021-04-24 ENCOUNTER — Ambulatory Visit (INDEPENDENT_AMBULATORY_CARE_PROVIDER_SITE_OTHER): Payer: Medicare Other | Admitting: Psychology

## 2021-04-24 ENCOUNTER — Encounter (INDEPENDENT_AMBULATORY_CARE_PROVIDER_SITE_OTHER): Payer: Self-pay | Admitting: Bariatrics

## 2021-04-24 ENCOUNTER — Other Ambulatory Visit: Payer: Self-pay

## 2021-04-24 VITALS — BP 130/73 | HR 70 | Temp 98.0°F | Ht 69.0 in | Wt 189.0 lb

## 2021-04-24 DIAGNOSIS — E669 Obesity, unspecified: Secondary | ICD-10-CM | POA: Diagnosis not present

## 2021-04-24 DIAGNOSIS — F4322 Adjustment disorder with anxiety: Secondary | ICD-10-CM

## 2021-04-24 DIAGNOSIS — I10 Essential (primary) hypertension: Secondary | ICD-10-CM

## 2021-04-24 DIAGNOSIS — Z6833 Body mass index (BMI) 33.0-33.9, adult: Secondary | ICD-10-CM | POA: Diagnosis not present

## 2021-04-24 DIAGNOSIS — E7849 Other hyperlipidemia: Secondary | ICD-10-CM

## 2021-04-24 NOTE — Progress Notes (Signed)
Chief Complaint:   OBESITY Gregory Wilkins is here to discuss his progress with his obesity treatment plan along with follow-up of his obesity related diagnoses. Gregory Wilkins is on the Category 3 Plan and states he is following his eating plan approximately 50% of the time. Gregory Wilkins states he is doing 0 minutes 0 times per week.  Today's visit was #: 11 Starting weight: 230 lbs Starting date: 09/17/2020 Today's weight: 189 lbs Today's date: 04/24/2021 Total lbs lost to date: 41 lbs Total lbs lost since last in-office visit: 0  Interim History: Gregory Wilkins's weight remains the same. He has not been as active. He states that his protein was down.  Subjective:   1. Other hyperlipidemia Gregory Wilkins is currently taking Lipitor.   2. Essential hypertension Gregory Wilkins's blood pressure is controlled.   Assessment/Plan:   1. Other hyperlipidemia Cardiovascular risk and specific lipid/LDL goals reviewed.  We discussed several lifestyle modifications today and Gregory Wilkins will continue to work on diet, exercise and weight loss efforts. Gregory Wilkins will continue medication. He will continue exercise. Handout: Low fats and increase protein was given today. Orders and follow up as documented in patient record.   Counseling Intensive lifestyle modifications are the first line treatment for this issue. Dietary changes: Increase soluble fiber. Decrease simple carbohydrates. Exercise changes: Moderate to vigorous-intensity aerobic activity 150 minutes per week if tolerated. Lipid-lowering medications: see documented in medical record.   2. Essential hypertension Gregory Wilkins will continue medication. He is working on healthy weight loss and exercise to improve blood pressure control. We will watch for signs of hypotension as he continues his lifestyle modifications.  3. Obesity, current BMI 28 Gregory Wilkins is currently in the action stage of change. As such, his goal is to continue with weight loss efforts. He has agreed to the Category 3 Plan.   Gregory Wilkins will  continue to follow the plan closely 80-90%. He will get back on track.  Exercise goals: Gregory Wilkins is doing Yoga for the back and spine. He is walking about 1 1/2 miles.   Behavioral modification strategies: increasing lean protein intake, decreasing simple carbohydrates, increasing vegetables, increasing water intake, decreasing eating out, no skipping meals, meal planning and cooking strategies, keeping healthy foods in the home, and planning for success.  Gregory Wilkins has agreed to follow-up with our clinic in 4 weeks. He was informed of the importance of frequent follow-up visits to maximize his success with intensive lifestyle modifications for his multiple health conditions.   Objective:   Blood pressure 130/73, pulse 70, temperature 98 F (36.7 C), height 5\' 9"  (1.753 m), weight 189 lb (85.7 kg), SpO2 96 %. Body mass index is 27.91 kg/m.  General: Cooperative, alert, well developed, in no acute distress. HEENT: Conjunctivae and lids unremarkable. Cardiovascular: Regular rhythm.  Lungs: Normal work of breathing. Neurologic: No focal deficits.   Lab Results  Component Value Date   CREATININE 1.03 02/26/2021   BUN 18 02/26/2021   NA 140 02/26/2021   K 4.3 02/26/2021   CL 103 02/26/2021   CO2 27 02/26/2021   Lab Results  Component Value Date   ALT 17 09/17/2020   AST 21 09/17/2020   ALKPHOS 87 09/17/2020   BILITOT 0.7 09/17/2020   Lab Results  Component Value Date   HGBA1C 5.3 09/17/2020   HGBA1C 5.3 11/10/2008   Lab Results  Component Value Date   INSULIN 9.4 09/17/2020   Lab Results  Component Value Date   TSH 1.600 09/17/2020   Lab Results  Component Value Date  CHOL 138 02/26/2021   HDL 60.40 02/26/2021   LDLCALC 70 02/26/2021   LDLDIRECT 124.6 11/10/2008   TRIG 38.0 02/26/2021   CHOLHDL 2 02/26/2021   Lab Results  Component Value Date   VD25OH 22.6 (L) 09/17/2020   Lab Results  Component Value Date   WBC 6.5 08/28/2020   HGB 14.5 08/28/2020   HCT 43.8  08/28/2020   MCV 90.3 08/28/2020   PLT 204.0 08/28/2020   Lab Results  Component Value Date   IRON 96 04/19/2015   FERRITIN 36.2 04/19/2015    Obesity Behavioral Intervention:   Approximately 15 minutes were spent on the discussion below.  ASK: We discussed the diagnosis of obesity with Gregory Wilkins today and Gregory Wilkins agreed to give [place] permission to discuss obesity behavioral modification therapy today.  ASSESS: Gregory Wilkins has the diagnosis of obesity and his BMI today is 28.0. Gregory Wilkins is in the action stage of change.   ADVISE: Gregory Wilkins was educated on the multiple health risks of obesity as well as the benefit of weight loss to improve his health. He was advised of the need for long term treatment and the importance of lifestyle modifications to improve his current health and to decrease his risk of future health problems.  AGREE: Multiple dietary modification options and treatment options were discussed and Gregory Wilkins agreed to follow the recommendations documented in the above note.  ARRANGE: Gregory Wilkins was educated on the importance of frequent visits to treat obesity as outlined per CMS and USPSTF guidelines and agreed to schedule his next follow up appointment today.  Attestation Statements:   Reviewed by clinician on day of visit: allergies, medications, problem list, medical history, surgical history, family history, social history, and previous encounter notes.   I, Lizbeth Bark, RMA, am acting as Location manager for [employer], DO.   I have reviewed the above documentation for accuracy and completeness, and I agree with the above. Jearld Lesch, DO

## 2021-05-06 ENCOUNTER — Other Ambulatory Visit: Payer: Self-pay | Admitting: Internal Medicine

## 2021-05-08 ENCOUNTER — Ambulatory Visit (INDEPENDENT_AMBULATORY_CARE_PROVIDER_SITE_OTHER): Payer: Medicare Other | Admitting: Psychology

## 2021-05-08 DIAGNOSIS — F4322 Adjustment disorder with anxiety: Secondary | ICD-10-CM

## 2021-05-13 ENCOUNTER — Other Ambulatory Visit (INDEPENDENT_AMBULATORY_CARE_PROVIDER_SITE_OTHER): Payer: Self-pay | Admitting: Bariatrics

## 2021-05-13 DIAGNOSIS — E559 Vitamin D deficiency, unspecified: Secondary | ICD-10-CM

## 2021-05-14 ENCOUNTER — Other Ambulatory Visit: Payer: Self-pay

## 2021-05-14 NOTE — Telephone Encounter (Signed)
Dr.Brown 

## 2021-05-15 ENCOUNTER — Other Ambulatory Visit (HOSPITAL_BASED_OUTPATIENT_CLINIC_OR_DEPARTMENT_OTHER): Payer: Self-pay

## 2021-05-15 MED ORDER — INFLUENZA VAC A&B SA ADJ QUAD 0.5 ML IM PRSY
PREFILLED_SYRINGE | INTRAMUSCULAR | 0 refills | Status: DC
  Filled 2021-05-15: qty 0.5, 1d supply, fill #0

## 2021-05-15 NOTE — Telephone Encounter (Signed)
LAST APPOINTMENT DATE: 04/03/21 NEXT APPOINTMENT DATE: 05/23/21   [id number] - [place], [place] - [address] [address] Phone: [phone] Fax: [phone]  Patient is requesting a refill of the following medications: Requested Prescriptions   Pending Prescriptions Disp Refills   Vitamin D, Ergocalciferol, (DRISDOL) 1.25 MG (50000 UNIT) CAPS capsule [Pharmacy Med Name: VITAMIN D2 1.25MG (50,000 UNIT)] 4 capsule 0    Sig: TAKE 1 CAPSULE (50,000 UNITS TOTAL) BY MOUTH EVERY 7 (SEVEN) DAYS    Date last filled: 04/03/21 Previously prescribed by Dr Owens Shark  Lab Results  Component Value Date   HGBA1C 5.3 09/17/2020   HGBA1C 5.3 11/10/2008   Lab Results  Component Value Date   MICROALBUR <0.7 01/25/2020   LDLCALC 70 02/26/2021   CREATININE 1.03 02/26/2021   Lab Results  Component Value Date   VD25OH 22.6 (L) 09/17/2020    BP Readings from Last 3 Encounters:  04/24/21 130/73  04/16/21 112/72  04/05/21 124/84

## 2021-05-15 NOTE — Telephone Encounter (Signed)
My chart message sent to pt.

## 2021-05-22 ENCOUNTER — Ambulatory Visit (INDEPENDENT_AMBULATORY_CARE_PROVIDER_SITE_OTHER): Payer: Medicare Other | Admitting: Psychology

## 2021-05-22 DIAGNOSIS — F4322 Adjustment disorder with anxiety: Secondary | ICD-10-CM

## 2021-05-23 ENCOUNTER — Ambulatory Visit (INDEPENDENT_AMBULATORY_CARE_PROVIDER_SITE_OTHER): Payer: Medicare Other | Admitting: Bariatrics

## 2021-05-27 ENCOUNTER — Ambulatory Visit (INDEPENDENT_AMBULATORY_CARE_PROVIDER_SITE_OTHER): Payer: Medicare Other | Admitting: Cardiology

## 2021-05-27 ENCOUNTER — Encounter: Payer: Self-pay | Admitting: Cardiology

## 2021-05-27 ENCOUNTER — Other Ambulatory Visit: Payer: Self-pay

## 2021-05-27 VITALS — BP 116/60 | HR 62 | Ht 69.0 in | Wt 196.0 lb

## 2021-05-27 DIAGNOSIS — R931 Abnormal findings on diagnostic imaging of heart and coronary circulation: Secondary | ICD-10-CM | POA: Diagnosis not present

## 2021-05-27 DIAGNOSIS — I77810 Thoracic aortic ectasia: Secondary | ICD-10-CM

## 2021-05-27 DIAGNOSIS — I2510 Atherosclerotic heart disease of native coronary artery without angina pectoris: Secondary | ICD-10-CM

## 2021-05-27 DIAGNOSIS — I10 Essential (primary) hypertension: Secondary | ICD-10-CM

## 2021-05-27 DIAGNOSIS — I2584 Coronary atherosclerosis due to calcified coronary lesion: Secondary | ICD-10-CM

## 2021-05-27 NOTE — Progress Notes (Signed)
Cardiology Office Note:    Date:  05/27/2021   ID:  [name], DOB 07-05-1941, MRN [id number]  PCP:  Colon Branch, MD  Cardiologist:  Jenean Lindau, MD   Referring MD: Colon Branch, MD    ASSESSMENT:    1. Essential (primary) hypertension   2. Coronary artery calcification   3. Ascending aorta dilatation (HCC)   4. Abnormal findings on diagnostic imaging of heart and coronary circulation     PLAN:    In order of problems listed above:  Primary prevention stressed with patient.  Importance of compliance with diet medication stressed any vocalized understanding Atrial fibrillation:I discussed with the patient atrial fibrillation, disease process. Management and therapy including rate and rhythm control, anticoagulation benefits and potential risks were discussed extensively with the patient. Patient had multiple questions which were answered to patient's satisfaction. Patient was advised to exercise on a regular basis and walk at least half an hour a day and he promises to do so. Essential hypertension: Blood pressure stable and diet was emphasized.  Lifestyle modification urged. Ascending aortic dilatation: Stable.  Blood pressure stable.  He requests that his CT scan to be checked 6 months from now and I respect his wishes. Patient will be seen in follow-up appointment in 6 months or earlier if the patient has any concerns    Medication Adjustments/Labs and Tests Ordered: Current medicines are reviewed at length with the patient today.  Concerns regarding medicines are outlined above.  Orders Placed This Encounter  Procedures   CT CHEST WO CONTRAST    No orders of the defined types were placed in this encounter.    Chief Complaint  Patient presents with   Follow-up     History of Present Illness:    Gregory Wilkins is a 80 y.o. male.  Patient has past medical history of atrial fibrillation, ascending aortic dilatation, essential hypertension.  He  denies any problems at this time and takes care of activities of daily living.  No chest pain orthopnea or PND.  At the time of my evaluation, the patient is alert awake oriented and in no distress.  Past Medical History:  Diagnosis Date   Acne rosacea 12/23/2006   Qualifier: Diagnosis of  By: Linna Darner MD, Gwyndolyn Saxon   Dr Crista Luria   Formatting of this note might be different from the original. Overview:  Qualifier: Diagnosis of  By: Linna Darner MD, Gwyndolyn Saxon   Dr Crista Luria Overview:  Overview:  Qualifier: Diagnosis of  By: Linna Darner MD, Gwyndolyn Saxon   Dr Crista Luria  Overview:  Qualifier: Diagnosis of  By: Linna Darner MD, Gwyndolyn Saxon   Dr Crista Luria Overview:  Overview:  Overview:   Alcohol abuse    Allergic rhinitis 12/23/2006   Qualifier: Diagnosis of  By: Linna Darner MD, Gwyndolyn Saxon   Onset:in high school Character: perennial but increased seasonally Triggers :cat, dust, pollen Allergy testing: Dr Bernita Buffy Maintenance medications/ response:Flonase/ Nasonex; saline wash; Allegra Smoking history:age 4- 84, up to 2 ppd Family history pulmonary disease: no    Formatting of this note might be different from the original. 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 Overview:  Qualifier: Diagnosis of  By: Linna Darner MD, Susann Givens of this note might be different from the original.  Overview:  Qualifier: Diagnosis of  By: Linna Darner MD, William  IMO 10/01 Updates   Enlarged prostate without lower urinary tract symptoms (luts) 11/09/2008   Formatting of this note might be different from the original. Overview:  Qualifier: Diagnosis of  By: Linna Darner MD, Gwyndolyn Saxon   No PMH of elevated PSA ; no biopsy Overview:  Overview:  Qualifier: Diagnosis of  By: Linna Darner MD, Gwyndolyn Saxon   No PMH of elevated PSA ; no biopsy Overview:  Overview:  Overview:  Qualifier: Diagnosis of  By: Linna Darner MD, Gwyndolyn Saxon   No PMH of elevated PSA ; no biopsy   Essential (primary) hypertension 09/21/2007   Qualifier: Diagnosis of  By: Linna Darner MD, Susann Givens of this note might be different from the original. Overview:  Qualifier: Diagnosis of  By: Linna Darner MD, [place]:  Blood pressure appears to be well controlled. Renal function will be checked.  He has a minor cough; this does not appear to be significant enough to warrant change to losartan Overview:  Overview:  Qu   Family history of type C viral hepatitis 06/30/2012   Brother had liver transplant    Hepatitis A 04/2019   History of colonic polyps 12/23/2006   Qualifier: Diagnosis of  By: Linna Darner MD, Gwyndolyn Saxon   Dr Earlie Raveling, GI Polypectomy X2 , 4/16 /2014. One was  transitional cell adenoma Due annually No FH colon cancer   Formatting of this note might be different from the original. Overview:  Qualifier: Diagnosis of  By: Linna Darner MD, Gwyndolyn Saxon   Dr Earlie Raveling, GI Polypectomy X2 , 4/16 /2014. One was  transitional cell adenoma Due annually No FH colon ca   History of urinary stone 09/21/2007   Qualifier: Diagnosis of  By: Linna Darner MD, Jessie Foot , passed spontaneously No Urologist involvement   Formatting of this note might be different from the original. Overview:  Qualifier: Diagnosis of  By: Linna Darner MD, Jessie Foot , passed spontaneously No Urologist involvement Overview:  Overview:  Qualifier: Diagnosis of  By: Linna Darner MD, Jessie Foot , passed  spontaneously No Urologist involvement    HYPERLIPIDEMIA 09/21/2007   Qualifier: Diagnosis of  By: Linna Darner MD, Cristopher Estimable Lipoprofile 2006 : LDL  108 ( 1072  / 575  ),  HDL 63 , Triglycerides  40. LDL goal = < 135 ; ideally < 105. No FH CAD   Formatting of this note might be different from the original. Overview:  Qualifier: Diagnosis of  By: Linna Darner MD, Cristopher Estimable Lipoprofile 2006 : LDL  108 ( 1072  / 575  ),  HDL 63 , Triglycerides  40. LDL goal = < 135 ; idea   Hypertension    Joint pain    Liver injury 04/2019   Liver injury, initial encounter 05/17/2019   Macular degeneration 01/06/2018   Malignant neoplasm of skin  Mallet finger 2012     4th L DIP,Dr Sypher   Mixed dyslipidemia 03/23/2020   Nephrolithiasis    Nocturnal hypoxia 11/04/2019   Nuclear sclerotic cataract of both eyes    Obesity (BMI 30-39.9) 01/04/2014   OSA (obstructive sleep apnea) 05/17/2020   Osteoarthritis    Overweight 03/23/2020   Pain in left foot 06/28/2020   PCP NOTES >>>>>>>>>>>>>>>> 05/26/2019   Personal history of other malignant neoplasm of skin 06/30/2012   Dr Wilhemina Bonito, Derm X3 Local resection from  right forearm and forehead.   Mohs surgery of the nose 06/2012, Dr Verlon Au of this note might be different from the original. Overview:  Dr Wilhemina Bonito, Derm X3 Local resection from  right forearm and forehead.   Mohs surgery of the nose 06/2012, Dr Sarajane Jews Overview:  Overview:  Dr Wilhemina Bonito, Derm X3 Local resection from  right forearm and fo   RLS (restless legs syndrome)    Sleep apnea    Snoring 02/01/2014   Formatting of this note might be different from the original. Last Assessment & Plan:  The patient has been noted to have significant snoring, but no one has ever really commented on an abnormal breathing pattern during sleep. Although he is overweight and does have restless sleep, the rest of his history is not overly impressive for sleep disordered breathing. If he does have sleep  apnea, I suspe    Past Surgical History:  Procedure Laterality Date   basal cell cancer     X 3   INGUINAL HERNIA REPAIR  [age]   right,left   OPEN REDUCTION INTERNAL FIXATION (ORIF) METACARPAL Right 05/19/2013   Procedure: OPEN REDUCTION INTERNAL FIXATION  RIGHT SMALL  METACARPAL FRACTURE;  Surgeon: Cammie Sickle., MD;  Location: [facility];  Service: Orthopedics;  Laterality: Right;   POLYPECTOMY     X 3; Dr Earlean Shawl   WISDOM TOOTH EXTRACTION      Current Medications: Current Meds  Medication Sig   aspirin EC 81 MG tablet Take 1 tablet (81 mg total) by mouth daily. Swallow whole.   atorvastatin (LIPITOR) 10 MG tablet TAKE 1 TABLET BY MOUTH EVERYDAY AT BEDTIME   cetirizine (ZYRTEC) 10 MG tablet Take 10 mg by mouth daily.   clonazePAM (KLONOPIN) 0.5 MG tablet TAKE 1 TABLET BY MOUTH EVERY NIGHT AT BEDTIME AS NEEDED FOR RESTLESS LEG   ELIQUIS 5 MG TABS tablet TAKE 1 TABLET BY MOUTH TWICE A DAY   hydrochlorothiazide (HYDRODIURIL) 12.5 MG tablet Take 1 tablet (12.5 mg total) by mouth daily.   losartan (COZAAR) 25 MG tablet Take 1 tablet (25 mg total) by mouth daily.   metoprolol tartrate (LOPRESSOR) 50 MG tablet Take 12.5 mg by mouth as needed for heart rate control (fast heart rate).   Multiple Vitamin (MULTIVITAMIN WITH MINERALS) TABS tablet Take 1 tablet by mouth daily.   Multiple Vitamins-Minerals (ICAPS AREDS 2 PO) Take 1 tablet by mouth daily in the afternoon.   Vitamin D, Ergocalciferol, (DRISDOL) 1.25 MG (50000 UNIT) CAPS capsule TAKE 1 CAPSULE (50,000 UNITS TOTAL) BY MOUTH EVERY 7 (SEVEN) DAYS     Allergies:   Penicillins and Ramipril   Social History   Socioeconomic History   Marital status: Married    Spouse name: Butch Penny    Number of children: Not on file   Years of education: Not on file   Highest education level: Not on file  Occupational History   Occupation: retired---Financial Administrator  Tobacco Use   Smoking status: Former    Packs/day: 2.00     Years: 26.00    Pack years: 52.00    Types: Cigarettes    Quit date: 07/29/1982    Years since quitting: 38.8   Smokeless tobacco: Never   Tobacco comments:    ages [age], up to 2 ppd  Vaping Use   Vaping Use: Never used  Substance and Sexual Activity   Alcohol use: No    Alcohol/week: 0.0 standard drinks   Drug use: No   Sexual activity: Not on file  Other Topics Concern   Not on file  Social History Narrative   Household: pt, wife, child   Social Determinants of Health   Financial Resource Strain: Not on file  Food Insecurity: Not on file  Transportation Needs: Not on file  Physical Activity: Not on file  Stress: Not on file  Social Connections: Not on file     Family History: The patient's family history includes Alcohol abuse in his mother; Diabetes in his father; Hypertension in his mother; Kidney disease in his father; Obesity in his mother; Stroke (age of onset: 60) in his mother. There is no history of Heart disease or Cancer.  ROS:   Please see the history of present illness.    All other systems reviewed and are negative.  EKGs/Labs/Other Studies Reviewed:    The following studies were reviewed today: I discussed my findings with the patient at length.   Recent Labs: 08/28/2020: Hemoglobin 14.5; Platelets 204.0 09/17/2020: ALT 17; TSH 1.600 02/26/2021: BUN 18; Creatinine, Ser 1.03; Potassium 4.3; Sodium 140  Recent Lipid Panel    Component Value Date/Time   CHOL 138 02/26/2021 0826   CHOL 148 09/17/2020 1400   TRIG 38.0 02/26/2021 0826   HDL 60.40 02/26/2021 0826   HDL 74 09/17/2020 1400   CHOLHDL 2 02/26/2021 0826   VLDL 7.6 02/26/2021 0826   LDLCALC 70 02/26/2021 0826   LDLCALC 64 09/17/2020 1400   LDLDIRECT 124.6 11/10/2008 0000    Physical Exam:    VS:  BP 116/60 (BP Location: Left Arm, Patient Position: Sitting, Cuff Size: Normal)   Pulse 62   Ht 5\' 9"  (1.753 m)   Wt 196 lb (88.9 kg)   SpO2 97%   BMI 28.94 kg/m     Wt Readings from  Last 3 Encounters:  05/27/21 196 lb (88.9 kg)  04/24/21 189 lb (85.7 kg)  04/16/21 191 lb 12.8 oz (87 kg)     GEN: Patient is in no acute distress HEENT: Normal NECK: No JVD; No carotid bruits LYMPHATICS: No lymphadenopathy CARDIAC: Hear sounds regular, 2/6 systolic murmur at the apex. RESPIRATORY:  Clear to auscultation without rales, wheezing or rhonchi  ABDOMEN: Soft, non-tender, non-distended MUSCULOSKELETAL:  No edema; No deformity  SKIN: Warm and dry NEUROLOGIC:  Alert and oriented x 3 PSYCHIATRIC:  Normal affect   Signed, Jenean Lindau, MD  05/27/2021 3:00 PM    [place]

## 2021-05-27 NOTE — Patient Instructions (Signed)
Medication Instructions:  Your physician has recommended you make the following change in your medication:   Decrease your Metoprolol tartrate (Lopressor) 12.5 mg as needed.  *If you need a refill on your cardiac medications before your next appointment, please call your pharmacy*   Lab Work: None ordered If you have labs (blood work) drawn today and your tests are completely normal, you will receive your results only by: [place] (if you have MyChart) OR A paper copy in the mail If you have any lab test that is abnormal or we need to change your treatment, we will call you to review the results.   Testing/Procedures: None ordered   Follow-Up: At [facility], you and your health needs are our priority.  As part of our continuing mission to provide you with exceptional heart care, we have created designated Provider Care Teams.  These Care Teams include your primary Cardiologist (physician) and Advanced Practice Providers (APPs -  Physician Assistants and Nurse Practitioners) who all work together to provide you with the care you need, when you need it.  We recommend signing up for the patient portal called "MyChart".  Sign up information is provided on this After Visit Summary.  MyChart is used to connect with patients for Virtual Visits (Telemedicine).  Patients are able to view lab/test results, encounter notes, upcoming appointments, etc.  Non-urgent messages can be sent to your provider as well.   To learn more about what you can do with MyChart, go to [email or web address].    Your next appointment:   6 month(s)  The format for your next appointment:   In Person  Provider:   Jyl Heinz, MD   Other Instructions NA

## 2021-06-03 ENCOUNTER — Encounter (INDEPENDENT_AMBULATORY_CARE_PROVIDER_SITE_OTHER): Payer: Self-pay | Admitting: Bariatrics

## 2021-06-03 ENCOUNTER — Ambulatory Visit (INDEPENDENT_AMBULATORY_CARE_PROVIDER_SITE_OTHER): Payer: Medicare Other | Admitting: Bariatrics

## 2021-06-03 ENCOUNTER — Other Ambulatory Visit: Payer: Self-pay

## 2021-06-03 VITALS — BP 131/72 | HR 50 | Temp 97.9°F | Ht 69.0 in | Wt 192.0 lb

## 2021-06-03 DIAGNOSIS — E559 Vitamin D deficiency, unspecified: Secondary | ICD-10-CM

## 2021-06-03 DIAGNOSIS — E782 Mixed hyperlipidemia: Secondary | ICD-10-CM

## 2021-06-03 DIAGNOSIS — Z6833 Body mass index (BMI) 33.0-33.9, adult: Secondary | ICD-10-CM

## 2021-06-03 DIAGNOSIS — E669 Obesity, unspecified: Secondary | ICD-10-CM

## 2021-06-03 MED ORDER — VITAMIN D (ERGOCALCIFEROL) 1.25 MG (50000 UNIT) PO CAPS: 50000.0000 [IU] | ORAL_CAPSULE | ORAL | 0 refills | Status: DC

## 2021-06-04 ENCOUNTER — Encounter (INDEPENDENT_AMBULATORY_CARE_PROVIDER_SITE_OTHER): Payer: Self-pay | Admitting: Bariatrics

## 2021-06-04 NOTE — Progress Notes (Signed)
Chief Complaint:   OBESITY Gregory Wilkins is here to discuss his progress with his obesity treatment plan along with follow-up of his obesity related diagnoses. Gregory Wilkins is on the Category 3 Plan and states he is following his eating plan approximately 70% of the time. Gregory Wilkins states he is doing cardio and weights for 120 minutes 6 times per week.  Today's visit was #: 12 Starting weight: 230 lbs Starting date: 09/17/2020 Today's weight: 192 lbs Today's date: 06/03/2021 Total lbs lost to date: 38 lbs Total lbs lost since last in-office visit: 0  Interim History: Gregory Wilkins is up and additional 3 lbs but has done well overall. He had several weeks of happy stress.   Subjective:   1. Vitamin D deficiency Gregory Wilkins is taking Vitamin D currently.   2. HYPERLIPIDEMIA Gregory Wilkins is currently taking Lipitor.   Assessment/Plan:   1. Vitamin D deficiency Low Vitamin D level contributes to fatigue and are associated with obesity, breast, and colon cancer. We will refill prescription Vitamin D 50,000 IU every week for 1 month with no refills and Gregory Wilkins will follow-up for routine testing of Vitamin D, at least 2-3 times per year to avoid over-replacement.  - Vitamin D, Ergocalciferol, (DRISDOL) 1.25 MG (50000 UNIT) CAPS capsule; Take 1 capsule (50,000 Units total) by mouth every 7 (seven) days.  Dispense: 4 capsule; Refill: 0  2. HYPERLIPIDEMIA Cardiovascular risk and specific lipid/LDL goals reviewed.  We discussed several lifestyle modifications today and Gregory Wilkins will continue to work on diet, exercise and weight loss efforts. Jarian will continue Lipitor. He will have zero trans fats. Orders and follow up as documented in patient record.   Counseling Intensive lifestyle modifications are the first line treatment for this issue. Dietary changes: Increase soluble fiber. Decrease simple carbohydrates. Exercise changes: Moderate to vigorous-intensity aerobic activity 150 minutes per week if tolerated. Lipid-lowering  medications: see documented in medical record.   3. Class 1 obesity with serious comorbidity and body mass index (BMI) of 33.0 to 33.9 in adult, unspecified obesity type Gregory Wilkins is currently in the action stage of change. As such, his goal is to continue with weight loss efforts. He has agreed to the Category 3 Plan.   Gregory Wilkins will remain strongly adherent to the plan 80-90%. Strategies for the holidays were given today.   Exercise goals:  Gregory Wilkins will continue to workout "using his Fitbit".  Behavioral modification strategies: increasing lean protein intake, decreasing simple carbohydrates, increasing vegetables, increasing water intake, decreasing eating out, no skipping meals, meal planning and cooking strategies, keeping healthy foods in the home, and planning for success.  Gregory Wilkins has agreed to follow-up with our clinic in 3 weeks myself or Gregory Bathe, FNP. He was informed of the importance of frequent follow-up visits to maximize his success with intensive lifestyle modifications for his multiple health conditions.   Objective:   Blood pressure 131/72, pulse (!) 50, temperature 97.9 F (36.6 C), height 5\' 9"  (1.753 m), weight 192 lb (87.1 kg), SpO2 96 %. Body mass index is 28.35 kg/m.  General: Cooperative, alert, well developed, in no acute distress. HEENT: Conjunctivae and lids unremarkable. Cardiovascular: Regular rhythm.  Lungs: Normal work of breathing. Neurologic: No focal deficits.   Lab Results  Component Value Date   CREATININE 1.03 02/26/2021   BUN 18 02/26/2021   NA 140 02/26/2021   K 4.3 02/26/2021   CL 103 02/26/2021   CO2 27 02/26/2021   Lab Results  Component Value Date   ALT 17 09/17/2020  AST 21 09/17/2020   ALKPHOS 87 09/17/2020   BILITOT 0.7 09/17/2020   Lab Results  Component Value Date   HGBA1C 5.3 09/17/2020   HGBA1C 5.3 11/10/2008   Lab Results  Component Value Date   INSULIN 9.4 09/17/2020   Lab Results  Component Value Date   TSH 1.600  09/17/2020   Lab Results  Component Value Date   CHOL 138 02/26/2021   HDL 60.40 02/26/2021   LDLCALC 70 02/26/2021   LDLDIRECT 124.6 11/10/2008   TRIG 38.0 02/26/2021   CHOLHDL 2 02/26/2021   Lab Results  Component Value Date   VD25OH 22.6 (L) 09/17/2020   Lab Results  Component Value Date   WBC 6.5 08/28/2020   HGB 14.5 08/28/2020   HCT 43.8 08/28/2020   MCV 90.3 08/28/2020   PLT 204.0 08/28/2020   Lab Results  Component Value Date   IRON 96 04/19/2015   FERRITIN 36.2 04/19/2015   Attestation Statements:   Reviewed by clinician on day of visit: allergies, medications, problem list, medical history, surgical history, family history, social history, and previous encounter notes.  I, Lizbeth Bark, RMA, am acting as Location manager for [employer], DO.   I have reviewed the above documentation for accuracy and completeness, and I agree with the above. Jearld Lesch, DO

## 2021-06-05 ENCOUNTER — Ambulatory Visit (INDEPENDENT_AMBULATORY_CARE_PROVIDER_SITE_OTHER): Payer: Medicare Other | Admitting: Psychology

## 2021-06-05 DIAGNOSIS — F4322 Adjustment disorder with anxiety: Secondary | ICD-10-CM

## 2021-06-07 ENCOUNTER — Ambulatory Visit (HOSPITAL_BASED_OUTPATIENT_CLINIC_OR_DEPARTMENT_OTHER)
Admission: RE | Admit: 2021-06-07 | Discharge: 2021-06-07 | Disposition: A | Discharge status: Home / Self Care | Payer: Medicare Other | Source: Ambulatory Visit | Attending: Cardiology | Admitting: Cardiology

## 2021-06-07 ENCOUNTER — Other Ambulatory Visit: Payer: Self-pay

## 2021-06-07 DIAGNOSIS — I77810 Thoracic aortic ectasia: Secondary | ICD-10-CM | POA: Diagnosis not present

## 2021-06-07 DIAGNOSIS — R931 Abnormal findings on diagnostic imaging of heart and coronary circulation: Secondary | ICD-10-CM | POA: Insufficient documentation

## 2021-06-19 ENCOUNTER — Ambulatory Visit (INDEPENDENT_AMBULATORY_CARE_PROVIDER_SITE_OTHER): Payer: Medicare Other | Admitting: Psychology

## 2021-06-19 DIAGNOSIS — F4322 Adjustment disorder with anxiety: Secondary | ICD-10-CM

## 2021-06-26 ENCOUNTER — Ambulatory Visit (INDEPENDENT_AMBULATORY_CARE_PROVIDER_SITE_OTHER): Payer: Medicare Other | Admitting: Bariatrics

## 2021-07-05 ENCOUNTER — Ambulatory Visit: Payer: Medicare Other | Attending: Internal Medicine

## 2021-07-05 ENCOUNTER — Other Ambulatory Visit (HOSPITAL_BASED_OUTPATIENT_CLINIC_OR_DEPARTMENT_OTHER): Payer: Self-pay

## 2021-07-05 DIAGNOSIS — Z23 Encounter for immunization: Secondary | ICD-10-CM

## 2021-07-05 MED ORDER — PFIZER COVID-19 VAC BIVALENT 30 MCG/0.3ML IM SUSP
INTRAMUSCULAR | 0 refills | Status: DC
  Filled 2021-07-05: qty 0.3, 1d supply, fill #0

## 2021-07-05 NOTE — Progress Notes (Signed)
   Covid-19 Vaccination Clinic  Name:  Gregory Wilkins    MRN: [id number] DOB: June 08, 1941  07/05/2021  [name] was observed post Covid-19 immunization for 15 minutes without incident. He was provided with Vaccine Information Sheet and instruction to access the V-Safe system.   [name] was instructed to call 911 with any severe reactions post vaccine: Difficulty breathing  Swelling of face and throat  A fast heartbeat  A bad rash all over body  Dizziness and weakness   Immunizations Administered     Name Date Dose VIS Date Route   Pfizer Covid-19 Vaccine Bivalent Booster 07/05/2021 10:36 AM 0.3 mL 03/27/2021 Intramuscular   Manufacturer: [place]   Lot: [id number]   [place]: [phone]

## 2021-07-13 ENCOUNTER — Other Ambulatory Visit: Payer: Self-pay | Admitting: Cardiology

## 2021-07-17 ENCOUNTER — Ambulatory Visit (INDEPENDENT_AMBULATORY_CARE_PROVIDER_SITE_OTHER): Payer: Medicare Other | Admitting: Psychology

## 2021-07-17 DIAGNOSIS — F4322 Adjustment disorder with anxiety: Secondary | ICD-10-CM | POA: Diagnosis not present

## 2021-07-17 NOTE — Progress Notes (Signed)
[facility] Counselor/Therapist Progress Note  Patient ID: Gregory Wilkins, MRN: [id number],    Date: 07/17/2021  Time Spent: 9:00am-9:50am   50 minutes   Treatment Type: Individual Therapy  Reported Symptoms: worrying  Mental Status Exam: Appearance:  Casual     Behavior: Appropriate  Motor: Normal  Speech/Language:  Normal Rate  Affect: Appropriate  Mood: normal  Thought process: normal  Thought content:   WNL  Sensory/Perceptual disturbances:   WNL  Orientation: oriented to person, place, time/date, and situation  Attention: Good  Concentration: Good  Memory: WNL  Fund of knowledge:  Good  Insight:   Good  Judgment:  Good  Impulse Control: Good   Risk Assessment: Danger to Self:  No Self-injurious Behavior: No Danger to Others: No Duty to Warn:no Physical Aggression / Violence:No  Access to Firearms a concern: No  Gang Involvement:No   Subjective:  Pt present for individual face-to-face therapy via video Webex.  Pt consents to telehealth video session due to COVID 19 pandemic.  Location of pt:  home Location of therapist: home office. Pt talked about his son.  He states his son is worse.  His paranoia has increased and he thinks his mother wants to hurt him.  Pt took his son to [facility] but not much was done.  Pt states the symptoms are drug related.  Pt is worried about his son.  Helped pt process his thoughts and feelings.  Addressed how pt copes and worked on additional coping strategies.    Pt has been trying to live in the present moment and is trying to let go.   Pt talked about family dynamics with his wife [name]'s family.  [name]'s sister has cut Butch Penny off but is not communicating about what she is upset about.  Addressed how this has affected pt.  Helped pt process family dynamics. Pt talked about Christmas plans.  He has plans with his extended family and dreads it.   Worked on increasing self care.  Provided supportive  therapy.     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  (Treatment Plan Target Date: 09/26/2021) Pt is progressing toward treatment goals.   Plan to continue to see pt every two weeks.    Jerimey Burridge, LCSW

## 2021-07-25 ENCOUNTER — Telehealth: Payer: Self-pay | Admitting: Internal Medicine

## 2021-07-25 DIAGNOSIS — G2581 Restless legs syndrome: Secondary | ICD-10-CM

## 2021-07-26 ENCOUNTER — Other Ambulatory Visit: Payer: Self-pay | Admitting: Internal Medicine

## 2021-07-26 NOTE — Telephone Encounter (Signed)
Requesting: clonazepam 0.5mg  Contract: 08/28/2020 UDS: 08/28/2020 Last Visit: 02/26/2021 Next Visit: 08/27/2021 Last Refill: 01/08/2021 #90 and 1RF  Please Advise

## 2021-07-26 NOTE — Telephone Encounter (Signed)
PDMP okay, Rx sent 

## 2021-07-31 ENCOUNTER — Ambulatory Visit (INDEPENDENT_AMBULATORY_CARE_PROVIDER_SITE_OTHER): Payer: Medicare Other | Admitting: Psychology

## 2021-07-31 DIAGNOSIS — F4322 Adjustment disorder with anxiety: Secondary | ICD-10-CM

## 2021-07-31 NOTE — Progress Notes (Signed)
[facility] Counselor/Therapist Progress Note  Patient ID: Gregory Wilkins, MRN: [id number],    Date: 07/31/2021  Time Spent: 9:00am-9:50am   50 minutes   Treatment Type: Individual Therapy  Reported Symptoms: worrying  Mental Status Exam: Appearance:  Casual     Behavior: Appropriate  Motor: Normal  Speech/Language:  Normal Rate  Affect: Appropriate  Mood: normal  Thought process: normal  Thought content:   WNL  Sensory/Perceptual disturbances:   WNL  Orientation: oriented to person, place, time/date, and situation  Attention: Good  Concentration: Good  Memory: WNL  Fund of knowledge:  Good  Insight:   Good  Judgment:  Good  Impulse Control: Good   Risk Assessment: Danger to Self:  No Self-injurious Behavior: No Danger to Others: No Duty to Warn:no Physical Aggression / Violence:No  Access to Firearms a concern: No  Gang Involvement:No   Subjective:  Pt present for individual face-to-face therapy via video Webex.  Pt consents to telehealth video session due to COVID 19 pandemic.  Location of pt:  home Location of therapist: home office. Pt talked about his son.  His son had a bad night where he was so paranoid and scared that he slept on the floor in pt's office.  Pt was with him and tried to console him.  Addressed pt's concerns about his son.  Pt's son blames pt and his wife for his problems bc of how he was parented.  Pt tends to struggle with guilt even though he did the best he could.  Pt's wife has a degree in child development and made most of the parenting decisions but even she blames pt for things that are wrong. Addressed how pt is impacted by being the person who is targeted by both his wife and son.  Helped pt process family dynamics. Addressed how pt copes and worked on additional coping strategies.    Pt has been trying to live in the present moment and is trying to let go.   Worked on increasing self care.  Provided supportive therapy.      Interventions: Cognitive Behavioral Therapy and Insight-Oriented  Diagnosis:  F43.22    Plan: See pt's Treatment Plan for anxiety in Therapy Charts.  (Treatment Plan Target Date: 09/26/2021) Pt is progressing toward treatment goals.   Plan to continue to see pt every two weeks.    Berry Godsey, LCSW          Lajeana Strough , LCSW

## 2021-08-01 ENCOUNTER — Encounter (INDEPENDENT_AMBULATORY_CARE_PROVIDER_SITE_OTHER): Payer: Self-pay | Admitting: Physician Assistant

## 2021-08-01 ENCOUNTER — Other Ambulatory Visit: Payer: Self-pay

## 2021-08-01 ENCOUNTER — Ambulatory Visit (INDEPENDENT_AMBULATORY_CARE_PROVIDER_SITE_OTHER): Payer: Medicare Other | Admitting: Physician Assistant

## 2021-08-01 VITALS — BP 115/65 | HR 60 | Temp 98.0°F | Ht 69.0 in | Wt 191.0 lb

## 2021-08-01 DIAGNOSIS — E669 Obesity, unspecified: Secondary | ICD-10-CM | POA: Diagnosis not present

## 2021-08-01 DIAGNOSIS — E559 Vitamin D deficiency, unspecified: Secondary | ICD-10-CM

## 2021-08-01 DIAGNOSIS — Z6833 Body mass index (BMI) 33.0-33.9, adult: Secondary | ICD-10-CM

## 2021-08-01 MED ORDER — VITAMIN D (ERGOCALCIFEROL) 1.25 MG (50000 UNIT) PO CAPS: 50000.0000 [IU] | ORAL_CAPSULE | ORAL | 0 refills | Status: DC

## 2021-08-01 NOTE — Progress Notes (Signed)
Chief Complaint:   OBESITY Rowland is here to discuss his progress with his obesity treatment plan along with follow-up of his obesity related diagnoses. Kimmy is on the Category 3 Plan and states he is following his eating plan approximately 50% of the time. Sayvon states he is doing yoga, tai chi, and using a stationary bike for 45-60 minutes 6 times per week.  Today's visit was #: 70 Starting weight: 230 lbs Starting date: 09/17/2020 Today's weight: 191 lbs Today's date: 08/01/2021 Total lbs lost to date: 39 lbs Total lbs lost since last in-office visit: 1 lb  Interim History: Jovin continues to be very active daily. He reports doing some holiday eating. He has been back on plan for the lat 3-4 days.  Subjective:   1. Vitamin D deficiency Melissa is currently on Vitamin D weekly. He is due for labs. His last Vitamin D level was 22.6.  Assessment/Plan:   1. Vitamin D deficiency Low Vitamin D level contributes to fatigue and are associated with obesity, breast, and colon cancer. We will refill prescription Vitamin D 50,000 IU every week and Byrd will follow-up for routine testing of Vitamin D, at least 2-3 times per year to avoid over-replacement.  - Vitamin D, Ergocalciferol, (DRISDOL) 1.25 MG (50000 UNIT) CAPS capsule; Take 1 capsule (50,000 Units total) by mouth every 7 (seven) days.  Dispense: 4 capsule; Refill: 0  2. obesity with current BMI 28.19 Jiyaan is currently in the action stage of change. As such, his goal is to continue with weight loss efforts. He has agreed to the Category 3 Plan.   Exercise goals:  As is.  Behavioral modification strategies: meal planning and cooking strategies.  Rodert has agreed to follow-up with our clinic in 4 weeks. He was informed of the importance of frequent follow-up visits to maximize his success with intensive lifestyle modifications for his multiple health conditions.   Objective:   Blood pressure 115/65, pulse 60, temperature 98 F (36.7  C), height 5' 9"  (1.753 m), weight 191 lb (86.6 kg), SpO2 97 %. Body mass index is 28.21 kg/m.  General: Cooperative, alert, well developed, in no acute distress. HEENT: Conjunctivae and lids unremarkable. Cardiovascular: Regular rhythm.  Lungs: Normal work of breathing. Neurologic: No focal deficits.   Lab Results  Component Value Date   CREATININE 1.03 02/26/2021   BUN 18 02/26/2021   NA 140 02/26/2021   K 4.3 02/26/2021   CL 103 02/26/2021   CO2 27 02/26/2021   Lab Results  Component Value Date   ALT 17 09/17/2020   AST 21 09/17/2020   ALKPHOS 87 09/17/2020   BILITOT 0.7 09/17/2020   Lab Results  Component Value Date   HGBA1C 5.3 09/17/2020   HGBA1C 5.3 11/10/2008   Lab Results  Component Value Date   INSULIN 9.4 09/17/2020   Lab Results  Component Value Date   TSH 1.600 09/17/2020   Lab Results  Component Value Date   CHOL 138 02/26/2021   HDL 60.40 02/26/2021   LDLCALC 70 02/26/2021   LDLDIRECT 124.6 11/10/2008   TRIG 38.0 02/26/2021   CHOLHDL 2 02/26/2021   Lab Results  Component Value Date   VD25OH 22.6 (L) 09/17/2020   Lab Results  Component Value Date   WBC 6.5 08/28/2020   HGB 14.5 08/28/2020   HCT 43.8 08/28/2020   MCV 90.3 08/28/2020   PLT 204.0 08/28/2020   Lab Results  Component Value Date   IRON 96 04/19/2015  FERRITIN 36.2 04/19/2015    Obesity Behavioral Intervention:   Approximately 15 minutes were spent on the discussion below.  ASK: We discussed the diagnosis of obesity with [name] today and Maleko agreed to give [place] permission to discuss obesity behavioral modification therapy today.  ASSESS: Ismeal has the diagnosis of obesity and his BMI today is 28.3. Gates is in the action stage of change.   ADVISE: Raynard was educated on the multiple health risks of obesity as well as the benefit of weight loss to improve his health. He was advised of the need for long term treatment and the importance of lifestyle modifications to improve  his current health and to decrease his risk of future health problems.  AGREE: Multiple dietary modification options and treatment options were discussed and Lavontay agreed to follow the recommendations documented in the above note.  ARRANGE: Kalvyn was educated on the importance of frequent visits to treat obesity as outlined per CMS and USPSTF guidelines and agreed to schedule his next follow up appointment today.  Attestation Statements:   Reviewed by clinician on day of visit: allergies, medications, problem list, medical history, surgical history, family history, social history, and previous encounter notes.  I, Tonye Pearson, am acting as Location manager for [employer], PA-C.  I have reviewed the above documentation for accuracy and completeness, and I agree with the above. Abby Potash, PA-C

## 2021-08-05 ENCOUNTER — Telehealth: Payer: Self-pay | Admitting: Internal Medicine

## 2021-08-05 NOTE — Telephone Encounter (Signed)
Left message for patient to call back and schedule Medicare Annual Wellness Visit (AWV) in office.  ° °If not able to come in office, please offer to do virtually or by telephone.  Left office number and my jabber #[phone]. ° °Due for AWVI ° °Please schedule at anytime with Nurse Health Advisor. °  °

## 2021-08-06 ENCOUNTER — Other Ambulatory Visit: Payer: Self-pay

## 2021-08-06 ENCOUNTER — Ambulatory Visit (INDEPENDENT_AMBULATORY_CARE_PROVIDER_SITE_OTHER): Payer: Medicare Other

## 2021-08-06 DIAGNOSIS — Z0000 Encounter for general adult medical examination without abnormal findings: Secondary | ICD-10-CM

## 2021-08-06 NOTE — Patient Instructions (Signed)
[name] , Thank you for taking time to come for your Medicare Wellness Visit. I appreciate your ongoing commitment to your health goals. Please review the following plan we discussed and let me know if I can assist you in the future.   Screening recommendations/referrals: Colonoscopy: no longer required  Recommended yearly ophthalmology/optometry visit for glaucoma screening and checkup Recommended yearly dental visit for hygiene and checkup  Vaccinations: Influenza vaccine: done 05/15/21 repeat every year  Pneumococcal vaccine: Up to date Tdap vaccine: Done 02/26/21 repeat every 10 years Shingles vaccine: Completed 1/30 & 02/26/19   Covid-19: Completed 1/13, 2/2, 04/24/20 & 4/27, 07/05/21  Advanced directives: Please bring a copy of your health care power of attorney and living will to the office at your convenience.  Conditions/risks identified: Lose weight   Next appointment: Follow up in one year for your annual wellness visit.   Preventive Care 64 Years and Older, Male Preventive care refers to lifestyle choices and visits with your health care provider that can promote health and wellness. What does preventive care include? A yearly physical exam. This is also called an annual well check. Dental exams once or twice a year. Routine eye exams. Ask your health care provider how often you should have your eyes checked. Personal lifestyle choices, including: Daily care of your teeth and gums. Regular physical activity. Eating a healthy diet. Avoiding tobacco and drug use. Limiting alcohol use. Practicing safe sex. Taking low doses of aspirin every day. Taking vitamin and mineral supplements as recommended by your health care provider. What happens during an annual well check? The services and screenings done by your health care provider during your annual well check will depend on your age, overall health, lifestyle risk factors, and family history of disease. Counseling  Your  health care provider may ask you questions about your: Alcohol use. Tobacco use. Drug use. Emotional well-being. Home and relationship well-being. Sexual activity. Eating habits. History of falls. Memory and ability to understand (cognition). Work and work Statistician. Screening  You may have the following tests or measurements: Height, weight, and BMI. Blood pressure. Lipid and cholesterol levels. These may be checked every 5 years, or more frequently if you are over 30 years old. Skin check. Lung cancer screening. You may have this screening every year starting at age 80 if you have a 30-pack-year history of smoking and currently smoke or have quit within the past 15 years. Fecal occult blood test (FOBT) of the stool. You may have this test every year starting at age 90. Flexible sigmoidoscopy or colonoscopy. You may have a sigmoidoscopy every 5 years or a colonoscopy every 10 years starting at age 67. Prostate cancer screening. Recommendations will vary depending on your family history and other risks. Hepatitis C blood test. Hepatitis B blood test. Sexually transmitted disease (STD) testing. Diabetes screening. This is done by checking your blood sugar (glucose) after you have not eaten for a while (fasting). You may have this done every 1-3 years. Abdominal aortic aneurysm (AAA) screening. You may need this if you are a current or former smoker. Osteoporosis. You may be screened starting at age 92 if you are at high risk. Talk with your health care provider about your test results, treatment options, and if necessary, the need for more tests. Vaccines  Your health care provider may recommend certain vaccines, such as: Influenza vaccine. This is recommended every year. Tetanus, diphtheria, and acellular pertussis (Tdap, Td) vaccine. You may need a Td booster every 10  years. Zoster vaccine. You may need this after age 72. Pneumococcal 13-valent conjugate (PCV13) vaccine. One dose  is recommended after age 72. Pneumococcal polysaccharide (PPSV23) vaccine. One dose is recommended after age 66. Talk to your health care provider about which screenings and vaccines you need and how often you need them. This information is not intended to replace advice given to you by your health care provider. Make sure you discuss any questions you have with your health care provider. Document Released: 08/10/2015 Document Revised: 04/02/2016 Document Reviewed: 05/15/2015 Elsevier Interactive Patient Education  2017 [place] Prevention in the Home Falls can cause injuries. They can happen to people of all ages. There are many things you can do to make your home safe and to help prevent falls. What can I do on the outside of my home? Regularly fix the edges of walkways and driveways and fix any cracks. Remove anything that might make you trip as you walk through a door, such as a raised step or threshold. Trim any bushes or trees on the path to your home. Use bright outdoor lighting. Clear any walking paths of anything that might make someone trip, such as rocks or tools. Regularly check to see if handrails are loose or broken. Make sure that both sides of any steps have handrails. Any raised decks and porches should have guardrails on the edges. Have any leaves, snow, or ice cleared regularly. Use sand or salt on walking paths during winter. Clean up any spills in your garage right away. This includes oil or grease spills. What can I do in the bathroom? Use night lights. Install grab bars by the toilet and in the tub and shower. Do not use towel bars as grab bars. Use non-skid mats or decals in the tub or shower. If you need to sit down in the shower, use a plastic, non-slip stool. Keep the floor dry. Clean up any water that spills on the floor as soon as it happens. Remove soap buildup in the tub or shower regularly. Attach bath mats securely with double-sided non-slip rug  tape. Do not have throw rugs and other things on the floor that can make you trip. What can I do in the bedroom? Use night lights. Make sure that you have a light by your bed that is easy to reach. Do not use any sheets or blankets that are too big for your bed. They should not hang down onto the floor. Have a firm chair that has side arms. You can use this for support while you get dressed. Do not have throw rugs and other things on the floor that can make you trip. What can I do in the kitchen? Clean up any spills right away. Avoid walking on wet floors. Keep items that you use a lot in easy-to-reach places. If you need to reach something above you, use a strong step stool that has a grab bar. Keep electrical cords out of the way. Do not use floor polish or wax that makes floors slippery. If you must use wax, use non-skid floor wax. Do not have throw rugs and other things on the floor that can make you trip. What can I do with my stairs? Do not leave any items on the stairs. Make sure that there are handrails on both sides of the stairs and use them. Fix handrails that are broken or loose. Make sure that handrails are as long as the stairways. Check any carpeting to make sure  that it is firmly attached to the stairs. Fix any carpet that is loose or worn. Avoid having throw rugs at the top or bottom of the stairs. If you do have throw rugs, attach them to the floor with carpet tape. Make sure that you have a light switch at the top of the stairs and the bottom of the stairs. If you do not have them, ask someone to add them for you. What else can I do to help prevent falls? Wear shoes that: Do not have high heels. Have rubber bottoms. Are comfortable and fit you well. Are closed at the toe. Do not wear sandals. If you use a stepladder: Make sure that it is fully opened. Do not climb a closed stepladder. Make sure that both sides of the stepladder are locked into place. Ask someone to  hold it for you, if possible. Clearly mark and make sure that you can see: Any grab bars or handrails. First and last steps. Where the edge of each step is. Use tools that help you move around (mobility aids) if they are needed. These include: Canes. Walkers. Scooters. Crutches. Turn on the lights when you go into a dark area. Replace any light bulbs as soon as they burn out. Set up your furniture so you have a clear path. Avoid moving your furniture around. If any of your floors are uneven, fix them. If there are any pets around you, be aware of where they are. Review your medicines with your doctor. Some medicines can make you feel dizzy. This can increase your chance of falling. Ask your doctor what other things that you can do to help prevent falls. This information is not intended to replace advice given to you by your health care provider. Make sure you discuss any questions you have with your health care provider. Document Released: 05/10/2009 Document Revised: 12/20/2015 Document Reviewed: 08/18/2014 Elsevier Interactive Patient Education  2017 [employer].

## 2021-08-06 NOTE — Progress Notes (Addendum)
Virtual Visit via Telephone Note  I connected with  Gregory Wilkins on 08/06/21 at  1:45 PM EST by telephone and verified that I am speaking with the correct person using two identifiers.  Medicare Annual Wellness visit completed telephonically due to Covid-19 pandemic.   Persons participating in this call: This Health Coach and this patient.   Location: Patient: Home Provider: Office   I discussed the limitations, risks, security and privacy concerns of performing an evaluation and management service by telephone and the availability of in person appointments. The patient expressed understanding and agreed to proceed.  Unable to perform video visit due to video visit attempted and failed and/or patient does not have video capability.   Some vital signs may be absent or patient reported.   Willette Brace, LPN   Subjective:   Gregory Wilkins is a 81 y.o. male who presents for an Initial Medicare Annual Wellness Visit.  Review of Systems     Cardiac Risk Factors include: advanced age (>61men, >89 women);hypertension;dyslipidemia;male gender     Objective:    Today's Vitals   08/06/21 1345  PainSc: 4    There is no height or weight on file to calculate BMI.  Advanced Directives 08/06/2021 05/17/2020 03/04/2020 05/16/2019 12/17/2018 05/18/2013  Does Patient Have a Medical Advance Directive? Yes No Yes Yes Yes Patient has advance directive, copy not in chart  Type of Advance Directive [place];Living will Living will;Healthcare Power of Attorney -  Copy of [place] in Chart? No - copy requested - - - - -  Would patient like information on creating a medical advance directive? - No - Patient declined - - - -    Current Medications (verified) Outpatient Encounter Medications as of 08/06/2021  Medication Sig   aspirin EC 81 MG tablet Take 1 tablet (81 mg total) by mouth daily. Swallow whole.    atorvastatin (LIPITOR) 10 MG tablet TAKE 1 TABLET BY MOUTH EVERYDAY AT BEDTIME   cetirizine (ZYRTEC) 10 MG tablet Take 10 mg by mouth daily.   clonazePAM (KLONOPIN) 0.5 MG tablet TAKE 1 TABLET BY MOUTH EVERY NIGHT AT BEDTIME AS NEEDED FOR RESTLESS LEG   ELIQUIS 5 MG TABS tablet TAKE 1 TABLET BY MOUTH TWICE A DAY   hydrochlorothiazide (HYDRODIURIL) 12.5 MG tablet TAKE 1 TABLET BY MOUTH EVERY DAY   losartan (COZAAR) 25 MG tablet TAKE 1 TABLET (25 MG TOTAL) BY MOUTH DAILY.   Multiple Vitamin (MULTIVITAMIN WITH MINERALS) TABS tablet Take 1 tablet by mouth daily.   Multiple Vitamins-Minerals (ICAPS AREDS 2 PO) Take 1 tablet by mouth daily in the afternoon.   Vitamin D, Ergocalciferol, (DRISDOL) 1.25 MG (50000 UNIT) CAPS capsule Take 1 capsule (50,000 Units total) by mouth every 7 (seven) days.   COVID-19 mRNA bivalent vaccine, Pfizer, (PFIZER COVID-19 VAC BIVALENT) injection Inject into the muscle.   metoprolol tartrate (LOPRESSOR) 50 MG tablet Take 12.5 mg by mouth as needed for heart rate control (fast heart rate). (Patient not taking: Reported on 08/06/2021)   No facility-administered encounter medications on file as of 08/06/2021.    Allergies (verified) Penicillins and Ramipril   History: Past Medical History:  Diagnosis Date   Acne rosacea 12/23/2006   Qualifier: Diagnosis of  By: Linna Darner MD, Gwyndolyn Saxon   Dr Crista Luria   Formatting of this note might be different from the original. Overview:  Qualifier: Diagnosis of  By: Linna Darner MD, Gwyndolyn Saxon  Dr Crista Luria Overview:  Overview:  Qualifier: Diagnosis of  By: Linna Darner MD, Gwyndolyn Saxon   Dr Crista Luria  Overview:  Qualifier: Diagnosis of  By: Linna Darner MD, Gwyndolyn Saxon   Dr Crista Luria Overview:  Overview:  Overview:   Alcohol abuse    Allergic rhinitis 12/23/2006   Qualifier: Diagnosis of  By: Linna Darner MD, Gwyndolyn Saxon   Onset:in high school Character: perennial but increased seasonally Triggers :cat, dust, pollen Allergy testing: Dr Bernita Buffy Maintenance  medications/ response:Flonase/ Nasonex; saline wash; Allegra Smoking history:age 30- 77, up to 2 ppd Family history pulmonary disease: no    Formatting of this note might be different from the original. 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 Overview:  Qualifier: Diagnosis of  By: Linna Darner MD, Susann Givens of this note might be different from the original. Overview:  Qualifier: Diagnosis of  By: Linna Darner MD, William  IMO 10/01 Updates   Enlarged prostate without lower urinary tract symptoms (luts) 11/09/2008   Formatting of this note might be different from the original. Overview:  Qualifier: Diagnosis of  By: Linna Darner MD, Gwyndolyn Saxon   No PMH of elevated PSA ; no biopsy Overview:  Overview:  Qualifier: Diagnosis of  By: Linna Darner MD, Gwyndolyn Saxon   No PMH of elevated PSA ; no biopsy Overview:  Overview:  Overview:  Qualifier: Diagnosis of  By: Linna Darner MD, Gwyndolyn Saxon   No PMH of elevated PSA ; no biopsy   Essential (primary) hypertension 09/21/2007   Qualifier: Diagnosis of  By: Linna Darner MD, Susann Givens of this note might be different from the original. Overview:  Qualifier: Diagnosis of  By: Linna Darner MD, [place]:  Blood pressure appears to be well controlled. Renal function will be checked.  He has a minor cough; this does not appear to be  significant enough to warrant change to losartan Overview:  Overview:  Qu   Family history of type C viral hepatitis 06/30/2012   Brother had liver transplant    Hepatitis A 04/2019   History of colonic polyps 12/23/2006   Qualifier: Diagnosis of  By: Linna Darner MD, Gwyndolyn Saxon   Dr Earlie Raveling, GI Polypectomy X2 , 4/16 /2014. One was  transitional cell adenoma Due annually No FH colon cancer   Formatting of this note might be different from the original. Overview:  Qualifier: Diagnosis of  By: Linna Darner MD, Gwyndolyn Saxon   Dr Earlie Raveling, GI Polypectomy X2 , 4/16 /2014. One was  transitional cell adenoma Due annually No FH colon ca   History of urinary stone 09/21/2007   Qualifier: Diagnosis of  By: Linna Darner MD, Jessie Foot , passed spontaneously No Urologist involvement   Formatting of this note might be  different from the original. Overview:  Qualifier: Diagnosis of  By: Linna Darner MD, Jessie Foot , passed spontaneously No Urologist involvement Overview:  Overview:  Qualifier: Diagnosis of  By: Linna Darner MD, Jessie Foot , passed spontaneously No Urologist involvement    HYPERLIPIDEMIA 09/21/2007   Qualifier: Diagnosis of  By: Linna Darner MD, Cristopher Estimable Lipoprofile 2006 : LDL  108 ( 1072  / 575  ),  HDL 63 , Triglycerides  40. LDL goal = < 135 ; ideally < 105. No FH CAD   Formatting of this note might be different from the original. Overview:  Qualifier: Diagnosis of  By: Linna Darner MD, Cristopher Estimable Lipoprofile 2006 : LDL  108 ( 1072  / 575  ),  HDL 63 , Triglycerides  40. LDL goal = < 135 ; idea   Hypertension    Joint pain    Liver injury 04/2019   Liver injury, initial encounter 05/17/2019   Macular degeneration 01/06/2018   Malignant neoplasm of skin    Mallet finger 2012     4th L DIP,Dr Sypher   Mixed dyslipidemia 03/23/2020   Nephrolithiasis    Nocturnal hypoxia 11/04/2019   Nuclear sclerotic cataract of both eyes    Obesity (BMI 30-39.9) 01/04/2014   OSA (obstructive sleep apnea) 05/17/2020   Osteoarthritis     Overweight 03/23/2020   Pain in left foot 06/28/2020   PCP NOTES >>>>>>>>>>>>>>>> 05/26/2019   Personal history of other malignant neoplasm of skin 06/30/2012   Dr Wilhemina Bonito, Derm X3 Local resection from  right forearm and forehead.   Mohs surgery of the nose 06/2012, Dr Verlon Au of this note might be different from the original. Overview:  Dr Wilhemina Bonito, Derm X3 Local resection from  right forearm and forehead.   Mohs surgery of the nose 06/2012, Dr Sarajane Jews Overview:  Overview:  Dr Wilhemina Bonito, Derm X3 Local resection from  right forearm and fo   RLS (restless legs syndrome)    Sleep apnea    Snoring 02/01/2014   Formatting of this note might be different from the original. Last Assessment & Plan:  The patient has been noted to have significant snoring, but no one has ever really commented on an abnormal breathing pattern during sleep. Although he is overweight and does have restless sleep, the rest of his history is not overly impressive for sleep disordered breathing. If he does have sleep apnea, I suspe   Past Surgical History:  Procedure Laterality Date   basal cell cancer     X 3   INGUINAL HERNIA REPAIR  [age]   right,left   OPEN REDUCTION INTERNAL FIXATION (ORIF) METACARPAL Right 05/19/2013   Procedure: OPEN REDUCTION INTERNAL FIXATION  RIGHT SMALL  METACARPAL FRACTURE;  Surgeon: Cammie Sickle., MD;  Location: [facility];  Service: Orthopedics;  Laterality: Right;   POLYPECTOMY     X 3; Dr Earlean Shawl   WISDOM TOOTH EXTRACTION     Family History  Problem Relation Age of Onset   Diabetes Father    Kidney disease Father    Stroke Mother 61   Hypertension Mother    Alcohol abuse Mother    Obesity Mother    Heart disease Neg Hx    Cancer Neg Hx    Social History   Socioeconomic History   Marital status: Married    Spouse name: Butch Penny    Number of children: Not on file  Years of education: Not on file   Highest education level: Not on file   Occupational History   Occupation: retired---Financial Analyst  Tobacco Use   Smoking status: Former    Packs/day: 2.00    Years: 26.00    Pack years: 52.00    Types: Cigarettes    Quit date: 07/29/1982    Years since quitting: 39.0   Smokeless tobacco: Never   Tobacco comments:    ages [age], up to 2 ppd  Vaping Use   Vaping Use: Never used  Substance and Sexual Activity   Alcohol use: No    Alcohol/week: 0.0 standard drinks   Drug use: No   Sexual activity: Not on file  Other Topics Concern   Not on file  Social History Narrative   Household: pt, wife, child   Social Determinants of Health   Financial Resource Strain: Low Risk    Difficulty of Paying Living Expenses: Not hard at all  Food Insecurity: No Food Insecurity   Worried About Charity fundraiser in the Last Year: Never true   Arboriculturist in the Last Year: Never true  Transportation Needs: No Transportation Needs   Lack of Transportation (Medical): No   Lack of Transportation (Non-Medical): No  Physical Activity: Sufficiently Active   Days of Exercise per Week: 6 days   Minutes of Exercise per Session: 60 min  Stress: No Stress Concern Present   Feeling of Stress : Not at all  Social Connections: Moderately Isolated   Frequency of Communication with Friends and Family: Once a week   Frequency of Social Gatherings with Friends and Family: More than three times a week   Attends Religious Services: Never   Marine scientist or Organizations: No   Attends Music therapist: Never   Marital Status: Married    Tobacco Counseling Counseling given: Not Answered Tobacco comments: ages [age], up to 2 ppd   Clinical Intake:  Pre-visit preparation completed: Yes  Pain : 0-10 Pain Score: 4  Pain Type: Chronic pain Pain Location: Hip Pain Orientation: Right Pain Descriptors / Indicators: Aching, Dull, Tingling Pain Onset: More than a month ago Pain Frequency: Intermittent     BMI -  recorded: 28.35 Nutritional Status: BMI 25 -29 Overweight Nutritional Risks: None Diabetes: No  How often do you need to have someone help you when you read instructions, pamphlets, or other written materials from your doctor or pharmacy?: 1 - Never  Diabetic?no  Interpreter Needed?: No  Information entered by :: Charlott Rakes, LPN   Activities of Daily Living In your present state of health, do you have any difficulty performing the following activities: 08/06/2021 08/28/2020  Hearing? N N  Vision? N N  Difficulty concentrating or making decisions? N N  Walking or climbing stairs? N N  Dressing or bathing? N N  Doing errands, shopping? N N  Preparing Food and eating ? N -  Using the Toilet? N -  In the past six months, have you accidently leaked urine? Y -  Comment if not careful -  Do you have problems with loss of bowel control? N -  Managing your Medications? N -  Managing your Finances? N -  Housekeeping or managing your Housekeeping? N -  Some recent data might be hidden    Patient Care Team: Colon Branch, MD as PCP - General (Internal Medicine)  Indicate any recent Medical Services you may have received from other than [facility] providers  in the past year (date may be approximate).     Assessment:   This is a routine wellness examination for Alondra.  Hearing/Vision screen Hearing Screening - Comments:: Pt denies any hearing issues Vision Screening - Comments:: Pt follows up with Dr Ulis Rias and for annual eye exams  Dietary issues and exercise activities discussed: Current Exercise Habits: Home exercise routine;Structured exercise class, Type of exercise: Other - see comments, Time (Minutes): 60, Frequency (Times/Week): 6, Weekly Exercise (Minutes/Week): 360   Goals Addressed             This Visit's Progress    Patient Stated       Lose weight        Depression Screen PHQ 2/9 Scores 08/06/2021 02/26/2021 09/17/2020 08/28/2020 05/16/2019 06/11/2015  06/09/2015  PHQ - 2 Score 0 0 0 2 0 0 0  PHQ- 9 Score - - 4 5 - - -    Fall Risk Fall Risk  08/06/2021 02/26/2021 08/28/2020 06/21/2019 12/22/2012  Falls in the past year? 0 0 0 0 No  Number falls in past yr: 0 0 0 - -  Injury with Fall? 0 0 0 - -  Risk for fall due to : Impaired vision - - - -  Follow up Falls prevention discussed Falls evaluation completed - Falls evaluation completed -    FALL RISK PREVENTION PERTAINING TO THE HOME:  Any stairs in or around the home? Yes  If so, are there any without handrails? No  Home free of loose throw rugs in walkways, pet beds, electrical cords, etc? Yes  Adequate lighting in your home to reduce risk of falls? Yes   ASSISTIVE DEVICES UTILIZED TO PREVENT FALLS:  Life alert? No  Use of a cane, walker or w/c? No  Grab bars in the bathroom? No  Shower chair or bench in shower? No  Elevated toilet seat or a handicapped toilet? No   TIMED UP AND GO:  Was the test performed? No .   Cognitive Function:     6CIT Screen 08/06/2021  What Year? 0 points  What month? 0 points  What time? 0 points  Count back from 20 0 points  Months in reverse 0 points  Repeat phrase 0 points  Total Score 0    Immunizations Immunization History  Administered Date(s) Administered   Fluad Quad(high Dose 65+) 05/15/2021   Influenza Split 05/28/2013   Influenza, High Dose Seasonal PF 06/02/2014, 04/08/2018, 03/07/2019, 04/17/2020   Influenza, Seasonal, Injecte, Preservative Fre 06/30/2012   Influenza,inj,Quad PF,6+ Mos 04/19/2015   PFIZER Comirnaty(Gray Top)Covid-19 Tri-Sucrose Vaccine 11/21/2020   PFIZER(Purple Top)SARS-COV-2 Vaccination 08/10/2019, 08/30/2019, 04/24/2020   Pfizer Covid-19 Vaccine Bivalent Booster 77yrs & up 07/05/2021   Pneumococcal Conjugate-13 04/29/2016   Pneumococcal Polysaccharide-23 05/09/2010, 08/28/2020   Td 12/26/2005, 01/09/2011   Tdap 02/26/2021   Zoster Recombinat (Shingrix) 08/26/2018, 03/07/2019   Zoster, Live 11/09/2008     TDAP status: Up to date  Flu Vaccine status: Up to date  Pneumococcal vaccine status: Up to date  Covid-19 vaccine status: Completed vaccines  Qualifies for Shingles Vaccine? Yes   Zostavax completed Yes   Shingrix Completed?: Yes  Screening Tests Health Maintenance  Topic Date Due   TETANUS/TDAP  02/27/2031   Pneumonia Vaccine 56+ Years old  Completed   INFLUENZA VACCINE  Completed   COVID-19 Vaccine  Completed   Hepatitis C Screening  Completed   Zoster Vaccines- Shingrix  Completed   HPV VACCINES  Aged Out   COLONOSCOPY (  Pts 45-67yrs Insurance coverage will need to be confirmed)  Discontinued    Health Maintenance  There are no preventive care reminders to display for this patient.  Colorectal cancer screening: No longer required.     Additional Screening:  Hepatitis C Screening:  Completed 05/16/19  Vision Screening: Recommended annual ophthalmology exams for early detection of glaucoma and other disorders of the eye. Is the patient up to date with their annual eye exam?  Yes  Who is the provider or what is the name of the office in which the patient attends annual eye exams? Dr Satira Sark Dr Nechama Guard If pt is not established with a provider, would they like to be referred to a provider to establish care? No .   Dental Screening: Recommended annual dental exams for proper oral hygiene  Community Resource Referral / Chronic Care Management: CRR required this visit?  No   CCM required this visit?  No      Plan:     I have personally reviewed and noted the following in the patients chart:   Medical and social history Use of alcohol, tobacco or illicit drugs  Current medications and supplements including opioid prescriptions. Patient is not currently taking opioid prescriptions. Functional ability and status Nutritional status Physical activity Advanced directives List of other physicians Hospitalizations, surgeries, and ER visits in previous 12  months Vitals Screenings to include cognitive, depression, and falls Referrals and appointments  In addition, I have reviewed and discussed with patient certain preventive protocols, quality metrics, and best practice recommendations. A written personalized care plan for preventive services as well as general preventive health recommendations were provided to patient.     Willette Brace, LPN   3/90/3009   Nurse Notes: None  I have reviewed and agree with Health Coaches documentation.  Kathlene November, MD

## 2021-08-14 ENCOUNTER — Ambulatory Visit (INDEPENDENT_AMBULATORY_CARE_PROVIDER_SITE_OTHER): Payer: Medicare Other | Admitting: Psychology

## 2021-08-14 DIAGNOSIS — F4322 Adjustment disorder with anxiety: Secondary | ICD-10-CM | POA: Diagnosis not present
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
## 2021-08-26 ENCOUNTER — Encounter (INDEPENDENT_AMBULATORY_CARE_PROVIDER_SITE_OTHER): Payer: Self-pay | Admitting: Bariatrics

## 2021-08-26 ENCOUNTER — Ambulatory Visit (INDEPENDENT_AMBULATORY_CARE_PROVIDER_SITE_OTHER): Payer: Medicare Other | Admitting: Bariatrics

## 2021-08-26 ENCOUNTER — Other Ambulatory Visit: Payer: Self-pay

## 2021-08-26 VITALS — BP 123/70 | HR 57 | Temp 98.0°F | Ht 69.0 in | Wt 195.0 lb

## 2021-08-26 DIAGNOSIS — I10 Essential (primary) hypertension: Secondary | ICD-10-CM | POA: Diagnosis not present

## 2021-08-26 DIAGNOSIS — E669 Obesity, unspecified: Secondary | ICD-10-CM | POA: Diagnosis not present

## 2021-08-26 DIAGNOSIS — Z6833 Body mass index (BMI) 33.0-33.9, adult: Secondary | ICD-10-CM

## 2021-08-26 DIAGNOSIS — Z6828 Body mass index (BMI) 28.0-28.9, adult: Secondary | ICD-10-CM

## 2021-08-26 DIAGNOSIS — E7849 Other hyperlipidemia: Secondary | ICD-10-CM | POA: Diagnosis not present

## 2021-08-26 DIAGNOSIS — E559 Vitamin D deficiency, unspecified: Secondary | ICD-10-CM

## 2021-08-26 MED ORDER — VITAMIN D (ERGOCALCIFEROL) 1.25 MG (50000 UNIT) PO CAPS: 50000.0000 [IU] | ORAL_CAPSULE | ORAL | 0 refills | Status: DC

## 2021-08-26 NOTE — Progress Notes (Signed)
Chief Complaint:   OBESITY Gregory Wilkins is here to discuss his progress with his obesity treatment plan along with follow-up of his obesity related diagnoses. Gregory Wilkins is on the Category 3 Plan and states he is following his eating plan approximately 50% of the time. Gregory Wilkins states he is doing [place] chi, and stationary bike for 45-60 minutes 2-4 times per week.  Today's visit was #: 14 Starting weight: 230 lbs Starting date: 09/17/2020 Today's weight: 195 lbs Today's date: 08/26/2021 Total lbs lost to date: 35 lbs Total lbs lost since last in-office visit: 0  Interim History: Gregory Wilkins is up 4 lbs since his last visit on 06/03/2021. He is ready to get on track.  Subjective:   1. Other hyperlipidemia Gregory Wilkins is currently taking Lipitor.   2. Essential hypertension Gregory Wilkins is taking HCTZ, Cozaar and Lopressor currently. His blood pressure is controlled. His last blood pressure was 115/65.  3. Vitamin D deficiency Gregory Wilkins is taking Vitamin D currently.  Assessment/Plan:   1. Other hyperlipidemia Cardiovascular risk and specific lipid/LDL goals reviewed.  Gregory Wilkins will continue taking Lipitor. We discussed several lifestyle modifications today and Gregory Wilkins will continue to work on diet, exercise and weight loss efforts. Orders and follow up as documented in patient record.   Counseling Intensive lifestyle modifications are the first line treatment for this issue. Dietary changes: Increase soluble fiber. Decrease simple carbohydrates. Exercise changes: Moderate to vigorous-intensity aerobic activity 150 minutes per week if tolerated. Lipid-lowering medications: see documented in medical record. - Lipid Panel With LDL/HDL Ratio - Comprehensive metabolic panel  2. Essential hypertension Gregory Wilkins will continue taking his medications. He is working on healthy weight loss and exercise to improve blood pressure control. We will watch for signs of hypotension as he continues his lifestyle modifications. - Comprehensive  metabolic panel  3. Vitamin D deficiency Low Vitamin D level contributes to fatigue and are associated with obesity, breast, and colon cancer. We will refill prescription Vitamin D 50,000 IU every week  for 1 month with no refills and Gregory Wilkins will follow-up for routine testing of Vitamin D, at least 2-3 times per year to avoid over-replacement. We will check Vitamin D today.   - Vitamin D, Ergocalciferol, (DRISDOL) 1.25 MG (50000 UNIT) CAPS capsule; Take 1 capsule (50,000 Units total) by mouth every 7 (seven) days.  Dispense: 4 capsule; Refill: 0 - VITAMIN D 25 Hydroxy (Vit-D Deficiency, Fractures)  4. obesity with current BMI 28.9 Gregory Wilkins is currently in the action stage of change. As such, his goal is to continue with weight loss efforts. He has agreed to the Category 3 Plan.   Gregory Wilkins will continue meal planning and he will continue intentional eating.  Exercise goals:  As is.  Behavioral modification strategies: increasing lean protein intake, decreasing simple carbohydrates, increasing vegetables, increasing water intake, decreasing eating out, no skipping meals, meal planning and cooking strategies, keeping healthy foods in the home, and planning for success.  Gregory Wilkins has agreed to follow-up with our clinic in 4-6 weeks with myself or Mina Marble, NP. He was informed of the importance of frequent follow-up visits to maximize his success with intensive lifestyle modifications for his multiple health conditions.   Objective:   Blood pressure 123/70, pulse (!) 57, temperature 98 F (36.7 C), height 5' 9"  (1.753 m), weight 195 lb (88.5 kg), SpO2 98 %. Body mass index is 28.8 kg/m.  General: Cooperative, alert, well developed, in no acute distress. HEENT: Conjunctivae and lids unremarkable. Cardiovascular: Regular rhythm.  Lungs: Normal  work of breathing. Neurologic: No focal deficits.   Lab Results  Component Value Date   CREATININE 1.03 02/26/2021   BUN 18 02/26/2021   NA 140 02/26/2021    K 4.3 02/26/2021   CL 103 02/26/2021   CO2 27 02/26/2021   Lab Results  Component Value Date   ALT 17 09/17/2020   AST 21 09/17/2020   ALKPHOS 87 09/17/2020   BILITOT 0.7 09/17/2020   Lab Results  Component Value Date   HGBA1C 5.3 09/17/2020   HGBA1C 5.3 11/10/2008   Lab Results  Component Value Date   INSULIN 9.4 09/17/2020   Lab Results  Component Value Date   TSH 1.600 09/17/2020   Lab Results  Component Value Date   CHOL 138 02/26/2021   HDL 60.40 02/26/2021   LDLCALC 70 02/26/2021   LDLDIRECT 124.6 11/10/2008   TRIG 38.0 02/26/2021   CHOLHDL 2 02/26/2021   Lab Results  Component Value Date   VD25OH 22.6 (L) 09/17/2020   Lab Results  Component Value Date   WBC 6.5 08/28/2020   HGB 14.5 08/28/2020   HCT 43.8 08/28/2020   MCV 90.3 08/28/2020   PLT 204.0 08/28/2020   Lab Results  Component Value Date   IRON 96 04/19/2015   FERRITIN 36.2 04/19/2015   Attestation Statements:   Reviewed by clinician on day of visit: allergies, medications, problem list, medical history, surgical history, family history, social history, and previous encounter notes.  I, Lizbeth Bark, RMA, am acting as Location manager for [employer], DO.  I have reviewed the above documentation for accuracy and completeness, and I agree with the above. Jearld Lesch, DO

## 2021-08-27 ENCOUNTER — Encounter: Payer: Self-pay | Admitting: Internal Medicine

## 2021-08-27 ENCOUNTER — Ambulatory Visit (INDEPENDENT_AMBULATORY_CARE_PROVIDER_SITE_OTHER): Payer: Medicare Other | Admitting: Internal Medicine

## 2021-08-27 VITALS — BP 136/82 | HR 56 | Temp 98.0°F | Resp 18 | Ht 69.0 in | Wt 200.1 lb

## 2021-08-27 DIAGNOSIS — K7460 Unspecified cirrhosis of liver: Secondary | ICD-10-CM

## 2021-08-27 DIAGNOSIS — J449 Chronic obstructive pulmonary disease, unspecified: Secondary | ICD-10-CM | POA: Insufficient documentation

## 2021-08-27 DIAGNOSIS — I10 Essential (primary) hypertension: Secondary | ICD-10-CM

## 2021-08-27 DIAGNOSIS — J410 Simple chronic bronchitis: Secondary | ICD-10-CM | POA: Diagnosis not present

## 2021-08-27 DIAGNOSIS — E782 Mixed hyperlipidemia: Secondary | ICD-10-CM

## 2021-08-27 HISTORY — DX: Chronic obstructive pulmonary disease, unspecified: J44.9

## 2021-08-27 LAB — COMPREHENSIVE METABOLIC PANEL
ALT: 10 IU/L (ref 0–44)
AST: 13 IU/L (ref 0–40)
Albumin/Globulin Ratio: 1.9 (ref 1.2–2.2)
Albumin: 4.4 g/dL (ref 3.7–4.7)
Alkaline Phosphatase: 88 IU/L (ref 44–121)
BUN/Creatinine Ratio: 13 (ref 10–24)
BUN: 12 mg/dL (ref 8–27)
Bilirubin Total: 0.5 mg/dL (ref 0.0–1.2)
CO2: 29 mmol/L (ref 20–29)
Calcium: 9.5 mg/dL (ref 8.6–10.2)
Chloride: 101 mmol/L (ref 96–106)
Creatinine, Ser: 0.96 mg/dL (ref 0.76–1.27)
Globulin, Total: 2.3 g/dL (ref 1.5–4.5)
Glucose: 80 mg/dL (ref 70–99)
Potassium: 4.4 mmol/L (ref 3.5–5.2)
Sodium: 145 mmol/L — ABNORMAL HIGH (ref 134–144)
Total Protein: 6.7 g/dL (ref 6.0–8.5)
eGFR: 81 mL/min/{1.73_m2} (ref >59)

## 2021-08-27 LAB — LIPID PANEL WITH LDL/HDL RATIO
Cholesterol, Total: 130 mg/dL (ref 100–199)
HDL: 67 mg/dL (ref >39)
LDL Chol Calc (NIH): 54 mg/dL (ref 0–99)
LDL/HDL Ratio: 0.8 ratio (ref 0.0–3.6)
Triglycerides: 33 mg/dL (ref 0–149)
VLDL Cholesterol Cal: 9 mg/dL (ref 5–40)

## 2021-08-27 LAB — VITAMIN D 25 HYDROXY (VIT D DEFICIENCY, FRACTURES): Vit D, 25-Hydroxy: 55.9 ng/mL (ref 30.0–100.0)

## 2021-08-27 NOTE — Patient Instructions (Signed)
Check the  blood pressure regularly BP GOAL is between 110/65 and  135/85. If it is consistently higher or lower, let me know    [place], Charlack back for a checkup by 02-2022

## 2021-08-27 NOTE — Assessment & Plan Note (Signed)
PLAN HTN: Seems well controlled, last BMP okay, continue HCTZ, losartan. High cholesterol: Last LDL excellent, continue atorvastatin. Paroxysmal A. fib: On Eliquis. Coronary calcifications: Per CT, on aspirin, controlling CV RF Cirrhosis: Per CT, history of acute hepatitis A  02-2019.  Normal platelet count, no edema.  Will monitor clinically.  Get PT and PTT at the next opportunity. COPD: Former smoker, COPD documented on CT incidentally, he is active, no DOE, some cough.  Monitor clinically. Prevention: UTD on flu, COVID and pneumonia shots. RTC 6 months routine office visit

## 2021-08-27 NOTE — Progress Notes (Signed)
Subjective:    Patient ID: Gregory Wilkins, male    DOB: 09-05-1940, 81 y.o.   MRN: [id number]  DOS:  08/27/2021 Type of visit - description: Follow-up  In general he reports he is doing well. Has MSK issues, follow-up elsewhere. Doing great with diet, working out regularly. Denies shortness of breath or DOE. Admits to cough on and off for grayish sputum.  No wheezing  Review of Systems See above   Past Medical History:  Diagnosis Date   Acne rosacea 12/23/2006   Qualifier: Diagnosis of  By: Linna Darner MD, Gwyndolyn Saxon   Dr Crista Luria   Formatting of this note might be different from the original. Overview:  Qualifier: Diagnosis of  By: Linna Darner MD, Gwyndolyn Saxon   Dr Crista Luria Overview:  Overview:  Qualifier: Diagnosis of  By: Linna Darner MD, Gwyndolyn Saxon   Dr Crista Luria  Overview:  Qualifier: Diagnosis of  By: Linna Darner MD, Gwyndolyn Saxon   Dr Crista Luria Overview:  Overview:  Overview:   Alcohol abuse    Allergic rhinitis 12/23/2006   Qualifier: Diagnosis of  By: Linna Darner MD, Gwyndolyn Saxon   Onset:in high school Character: perennial but increased seasonally Triggers :cat, dust, pollen Allergy testing: Dr Bernita Buffy Maintenance medications/ response:Flonase/ Nasonex; saline wash; Allegra Smoking history:age 59- 17, up to 2 ppd Family history pulmonary disease: no    Formatting of this note might be different from the original. 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 Overview:  Qualifier: Diagnosis of  By: Linna Darner MD, Susann Givens of this note might be different from the original. Overview:  Qualifier: Diagnosis of  By: Linna Darner MD, William  IMO 10/01 Updates   Enlarged prostate without lower urinary tract symptoms (luts) 11/09/2008   Formatting of this note might be different from the original. Overview:  Qualifier: Diagnosis of  By: Linna Darner MD, Gwyndolyn Saxon   No PMH of elevated PSA ; no biopsy Overview:  Overview:  Qualifier: Diagnosis of  By: Linna Darner MD, Gwyndolyn Saxon   No PMH of elevated PSA ; no biopsy Overview:  Overview:  Overview:  Qualifier: Diagnosis of  By: Linna Darner MD, Gwyndolyn Saxon   No PMH of elevated PSA ; no biopsy   Essential (primary) hypertension 09/21/2007   Qualifier: Diagnosis of  By: Linna Darner MD, Susann Givens of this note might be different from the original. Overview:  Qualifier: Diagnosis of  By: Linna Darner MD, [place]:  Blood pressure appears to be well controlled. Renal function will be checked.  He has a minor cough; this does not appear to be significant enough to warrant change to losartan Overview:  Overview:  Qu   Family history of type C viral hepatitis 06/30/2012  Brother had liver transplant    Hepatitis A 04/2019   History of colonic polyps 12/23/2006   Qualifier: Diagnosis of  By: Linna Darner MD, Gwyndolyn Saxon   Dr Earlie Raveling, GI Polypectomy X2 , 4/16 /2014. One was  transitional cell adenoma Due annually No FH colon cancer   Formatting of this note might be different from the original. Overview:  Qualifier: Diagnosis of  By: Linna Darner MD, Gwyndolyn Saxon   Dr Earlie Raveling, GI Polypectomy X2 , 4/16 /2014. One was  transitional cell adenoma Due annually No FH colon ca   History of urinary stone 09/21/2007   Qualifier: Diagnosis of  By: Linna Darner MD, Jessie Foot , passed spontaneously No Urologist involvement   Formatting of this note might be different  from the original. Overview:  Qualifier: Diagnosis of  By: Linna Darner MD, Jessie Foot , passed spontaneously No Urologist involvement Overview:  Overview:  Qualifier: Diagnosis of  By: Linna Darner MD, Jessie Foot , passed spontaneously No Urologist involvement    HYPERLIPIDEMIA 09/21/2007   Qualifier: Diagnosis of  By: Linna Darner MD, Cristopher Estimable Lipoprofile 2006 : LDL  108 ( 1072  / 575  ),  HDL 63 , Triglycerides  40. LDL goal = < 135 ; ideally < 105. No FH CAD   Formatting of this note might be different from the original. Overview:  Qualifier: Diagnosis of  By: Linna Darner MD, Cristopher Estimable Lipoprofile 2006 : LDL  108 ( 1072  / 575  ),  HDL 63 , Triglycerides  40. LDL goal = < 135 ; idea   Hypertension    Joint pain    Liver injury 04/2019   Liver injury, initial encounter 05/17/2019   Macular degeneration 01/06/2018   Malignant neoplasm of skin    Mallet finger 2012     4th L DIP,Dr Sypher   Mixed dyslipidemia 03/23/2020   Nephrolithiasis    Nocturnal hypoxia 11/04/2019   Nuclear sclerotic cataract of both eyes    Obesity (BMI 30-39.9) 01/04/2014   OSA (obstructive sleep apnea) 05/17/2020   Osteoarthritis    Overweight 03/23/2020   Pain in left foot 06/28/2020   PCP NOTES >>>>>>>>>>>>>>>> 05/26/2019   Personal history of other malignant neoplasm of skin 06/30/2012   Dr Wilhemina Bonito, Derm X3 Local resection from  right forearm and forehead.   Mohs surgery of the nose 06/2012, Dr Verlon Au of this note might be different from the original. Overview:  Dr Wilhemina Bonito, Derm X3 Local resection from  right forearm and forehead.   Mohs surgery of the nose 06/2012, Dr Sarajane Jews Overview:  Overview:  Dr Wilhemina Bonito, Derm X3 Local resection from  right forearm and fo   RLS (restless legs syndrome)    Sleep apnea    Snoring 02/01/2014   Formatting of this note might be different from the original. Last Assessment & Plan:  The patient has been noted to have significant snoring, but no one has ever really  commented on an abnormal breathing pattern during sleep. Although he is overweight and does have restless sleep, the rest of his history is not overly impressive for sleep disordered breathing. If he does have sleep apnea, I suspe    Past Surgical History:  Procedure Laterality Date   basal cell cancer     X 3   INGUINAL HERNIA REPAIR  [age]   right,left   OPEN REDUCTION INTERNAL FIXATION (ORIF) METACARPAL Right 05/19/2013   Procedure: OPEN  REDUCTION INTERNAL FIXATION  RIGHT SMALL  METACARPAL FRACTURE;  Surgeon: Cammie Sickle., MD;  Location: [facility];  Service: Orthopedics;  Laterality: Right;   POLYPECTOMY     X 3; Dr Earlean Shawl   WISDOM TOOTH EXTRACTION      Current Outpatient Medications  Medication Instructions   aspirin EC 81 mg, Oral, Daily, Swallow whole.   atorvastatin (LIPITOR) 10 MG tablet TAKE 1 TABLET BY MOUTH EVERYDAY AT BEDTIME   cetirizine (ZYRTEC) 10 mg, Oral, Daily   clonazePAM (KLONOPIN) 0.5 MG tablet TAKE 1 TABLET BY MOUTH EVERY NIGHT AT BEDTIME AS NEEDED FOR RESTLESS LEG   ELIQUIS 5 MG TABS tablet TAKE 1 TABLET BY MOUTH TWICE A DAY   hydrochlorothiazide (HYDRODIURIL) 12.5 MG tablet TAKE 1 TABLET BY MOUTH EVERY DAY   losartan (COZAAR) 25 mg, Oral, Daily   metoprolol tartrate (LOPRESSOR) 12.5 mg, Oral, As needed   Multiple Vitamin (MULTIVITAMIN WITH MINERALS) TABS tablet 1 tablet, Oral, Daily   Multiple Vitamins-Minerals (ICAPS AREDS 2 PO) 1 tablet, Oral, Daily   Vitamin D (Ergocalciferol) (DRISDOL) 50,000 Units, Oral, Every 7 days       Objective:   Physical Exam BP 136/82 (BP Location: Left Arm, Patient Position: Sitting, Cuff Size: Small)    Pulse (!) 56    Temp 98 F (36.7 C) (Oral)    Resp 18    Ht 5\' 9"  (1.753 m)    Wt 200 lb 2 oz (90.8 kg)    SpO2 96%    BMI 29.55 kg/m  General:   Well developed, NAD, BMI noted.  HEENT:  Normocephalic . Face symmetric, atraumatic Lungs:  CTA B Normal respiratory effort, no intercostal retractions,  no accessory muscle use. Heart: Bradycardic  abdomen:  Not distended, soft, non-tender. No rebound or rigidity.  No organomegaly Skin: Not pale. Not jaundice Lower extremities: no pretibial edema bilaterally  Neurologic:  alert & oriented X3.  Speech normal, gait appropriate for age and unassisted Psych--  Cognition and judgment appear intact.  Cooperative with normal attention span and concentration.  Behavior appropriate. No anxious or depressed appearing.     Assessment      Assessment (new patient 05/17/2019) HTN High Cholesterol Restless leg (clonazepam prn) Parox. A. Flutter DX 02-2020 H/o kidney stone Chronic LE edema L>R, mild  Hepatitis a, acute 04-2019 OSA  04-2020, on Cpap MSK: DJD, spinal stenosis  CT November 2022, incidental findings:  -Cirrhosis - R adrenal adenoma, -COPD (h/o tobacco) - coronary calcifications   PLAN HTN: Seems well controlled, last BMP okay, continue HCTZ, losartan. High cholesterol: Last LDL excellent, continue atorvastatin. Paroxysmal A. fib: On Eliquis. Coronary calcifications: Per CT, on aspirin, controlling CV RF Cirrhosis: Per CT, history of acute hepatitis A  02-2019.  Normal platelet count, no edema.  Will monitor clinically.  Get PT and PTT at the next opportunity. COPD: Former smoker, COPD documented on CT incidentally, he is active, no DOE, some cough.  Monitor clinically. Prevention: UTD on flu, COVID and pneumonia shots. RTC 6 months routine office visit  This visit occurred during the SARS-CoV-2 public health emergency.  Safety protocols were in place, including screening questions prior to the visit, additional usage of staff PPE, and extensive cleaning of exam room while observing appropriate contact time as indicated for disinfecting solutions.

## 2021-08-28 ENCOUNTER — Ambulatory Visit (INDEPENDENT_AMBULATORY_CARE_PROVIDER_SITE_OTHER): Payer: Medicare Other | Admitting: Psychology

## 2021-08-28 DIAGNOSIS — F4322 Adjustment disorder with anxiety: Secondary | ICD-10-CM | POA: Diagnosis not present
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
## 2021-09-11 ENCOUNTER — Ambulatory Visit (INDEPENDENT_AMBULATORY_CARE_PROVIDER_SITE_OTHER): Payer: Medicare Other | Admitting: Psychology

## 2021-09-11 DIAGNOSIS — F4322 Adjustment disorder with anxiety: Secondary | ICD-10-CM
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
## 2021-09-24 ENCOUNTER — Ambulatory Visit (INDEPENDENT_AMBULATORY_CARE_PROVIDER_SITE_OTHER): Payer: Medicare Other | Admitting: Bariatrics

## 2021-09-25 ENCOUNTER — Ambulatory Visit (INDEPENDENT_AMBULATORY_CARE_PROVIDER_SITE_OTHER): Payer: Medicare Other | Admitting: Psychology

## 2021-09-25 DIAGNOSIS — F4322 Adjustment disorder with anxiety: Secondary | ICD-10-CM | POA: Diagnosis not present

## 2021-09-25 NOTE — Progress Notes (Signed)
[facility] Counselor/Therapist Progress Note ? ?Patient ID: Gregory Wilkins, MRN: [id number],   ? ?Date: 09/25/2021 ? ?Time Spent: 9:00am-9:50am   50 minutes  ? ?Treatment Type: Individual Therapy ? ?Reported Symptoms: worrying ? ?Mental Status Exam: ?Appearance:  Casual     ?Behavior: Appropriate  ?Motor: Normal  ?Speech/Language:  Normal Rate  ?Affect: Appropriate  ?Mood: normal  ?Thought process: normal  ?Thought content:   WNL  ?Sensory/Perceptual disturbances:   WNL  ?Orientation: oriented to person, place, time/date, and situation  ?Attention: Good  ?Concentration: Good  ?Memory: WNL  ?Fund of knowledge:  Good  ?Insight:   Good  ?Judgment:  Good  ?Impulse Control: Good  ? ?Risk Assessment: ?Danger to Self:  No ?Self-injurious Behavior: No ?Danger to Others: No ?Duty to Warn:no ?Physical Aggression / Violence:No  ?Access to Firearms a concern: No  ?Gang Involvement:No  ? ?Subjective:  ?Pt present for individual face-to-face therapy via video Webex.  Pt consents to telehealth video session due to COVID 19 pandemic.  ?Location of pt:  home ?Location of therapist: home office. ?Pt talked about his son Dvon who had a bad episode a few days ago.   His son knows he has a problem but believes in his paranoid thinking.  His son thinks his mother and others are plotting against him.  Pt's wife is reacting with verbalizing efforts of "tough love".   She wants to put their son out of their house but pt does not.  Addressed pt's interactions with his son and wife.   Addressed how difficult dealing with them is.   ?Helped pt process his feelings and family dynamics. ?Addressed how pt copes and worked on additional coping strategies.    Pt has been trying to live in the present moment and is trying to let go.   ?Pt talked about a few weeks ago feeling so down that he felt like drinking.  He went to an [place] which was helpful for him.   ?Worked on increasing self care.  ?Provided supportive therapy.     ? ?Interventions: Cognitive Behavioral Therapy and Insight-Oriented ? ?Diagnosis:  F43.22   ? ?Plan: See pt's Treatment Plan for anxiety in Therapy Charts.  (Treatment Plan Target Date: 09/26/2021) ?Pt is progressing toward treatment goals.   ?Plan to continue to see pt every two weeks.   ? ?Bexley Mclester, LCSW ? ? ?

## 2021-09-30 ENCOUNTER — Other Ambulatory Visit: Payer: Self-pay | Admitting: Cardiology

## 2021-09-30 DIAGNOSIS — I4892 Unspecified atrial flutter: Secondary | ICD-10-CM

## 2021-10-01 DIAGNOSIS — S83289A Other tear of lateral meniscus, current injury, unspecified knee, initial encounter: Secondary | ICD-10-CM | POA: Insufficient documentation

## 2021-10-01 HISTORY — DX: Other tear of lateral meniscus, current injury, unspecified knee, initial encounter: S83.289A

## 2021-10-01 NOTE — Telephone Encounter (Signed)
Prescription refill request for Eliquis received. ?Indication: Afib  ?Last office visit: 05/27/21 (Rev[facility]) ?Scr: 0.96 (08/16/21) ?Age: 81 ?Weight: 90.8kg ? ?Appropriate dose and refill sent to requested pharmacy.  ?

## 2021-10-09 ENCOUNTER — Ambulatory Visit (INDEPENDENT_AMBULATORY_CARE_PROVIDER_SITE_OTHER): Payer: Medicare Other | Admitting: Psychology

## 2021-10-09 ENCOUNTER — Telehealth: Payer: Self-pay

## 2021-10-09 ENCOUNTER — Telehealth: Payer: Self-pay | Admitting: *Deleted

## 2021-10-09 DIAGNOSIS — F4322 Adjustment disorder with anxiety: Secondary | ICD-10-CM | POA: Diagnosis not present

## 2021-10-09 NOTE — Telephone Encounter (Signed)
Pt has been scheduled for a telephone visit, 10/10/21.  Consent on file / meds reconciled. ?

## 2021-10-09 NOTE — Telephone Encounter (Signed)
Clinical pharmacist to review Eliquis 

## 2021-10-09 NOTE — Progress Notes (Signed)
[facility] Counselor Initial Adult Exam ? ?Name: Gregory Wilkins ?Date: 10/09/2021 ?MRN: [id number] ?DOB: April 15, 1941 ?PCP: Colon Branch, MD ? ?Time spent: 9:00am - 9:50am  50 minutes ? ?Guardian/Payee:  n/a   ? ?Paperwork requested: No  ? ?Reason for Visit /Presenting Problem:  ?Pt present for face-to-face initial assessment update via video Webex.  Pt consents to telehealth video session due to COVID 19 pandemic.  ?Location of pt:  home ?Location of therapist: home office. ?Pt continues to have anxiety at times related to family issues and challenges of aging.   Pt is interested in coming to counseling to "vent" and get some advice.   Pt has significant family issues related to his son being a drug addict and living with him and wife.   ?Addressed how pt copes and worked on additional coping strategies.       ?Worked on increasing self care.  ?Provided supportive therapy.   ?Reviewed pt's treatment plan for annual update.   Pt participated in setting treatment goals.   Plan to continue to meet every two weeks.   ?  ? ?Mental Status Exam: ?Appearance:   Casual     ?Behavior:  Appropriate  ?Motor:  Normal  ?Speech/Language:   Normal Rate  ?Affect:  Appropriate  ?Mood:  normal  ?Thought process:  normal  ?Thought content:    WNL  ?Sensory/Perceptual disturbances:    WNL  ?Orientation:  oriented to person, place, time/date, and situation  ?Attention:  Good  ?Concentration:  Good  ?Memory:  WNL  ?Fund of knowledge:   Good  ?Insight:    Good  ?Judgment:   Good  ?Impulse Control:  Good  ? ? ?Reported Symptoms:  stress ? ?Risk Assessment: ?Danger to Self:  No ?Self-injurious Behavior: No ?Danger to Others: No ?Duty to Warn:no ?Physical Aggression / Violence:No  ?Access to Firearms a concern: No  ?Gang Involvement:No  ?Patient / guardian was educated about steps to take if suicide or homicide risk level increases between visits: n/a ?While future psychiatric events cannot be accurately predicted, the  patient does not currently require acute inpatient psychiatric care and does not currently meet [facility] involuntary commitment criteria. ? ?Substance Abuse History: ?Current substance abuse: No    ? ?Past Psychiatric History:   ?Previous psychological history is significant for anxiety ?Outpatient Providers:in therapy in the past with Dr. Bobby Rumpf ?History of Psych Hospitalization: No  ?Psychological Testing:  n/a   ? ?Abuse History:  ?Victim of: No.,  n/a    ?Report needed: No. ?Victim of Neglect:No. ?Perpetrator of  n/a   ?Witness / Exposure to Domestic Violence: No   ?Protective Services Involvement: No  ?Witness to [employer] Violence:  No  ? ?Family History:  ?Family History  ?Problem Relation Age of Onset  ? Diabetes Father   ? Kidney disease Father   ? Stroke Mother 72  ? Hypertension Mother   ? Alcohol abuse Mother   ? Obesity Mother   ? Heart disease Neg Hx   ? Cancer Neg Hx   ? ? ?Living situation:    Pt lives with wife.  Their adult son lives with them. ? ?Pt grew up with both parents.  Father was an Passenger transport manager.  Pt had 2 siblings.  Brother died 9 years ago.  Mother died 4 months after that.  Father died 6 years ago.  Pt's sister is still living and they have a good relationship.   ?Pt's mother was alcoholic.  Maternal  grandfather died of the flu in 1916-11-06.  Maternal grandmother was alcoholic .  Mother was raised by a second cousin.  Father's family were "hillbillies."   Several were alcoholics.   ?Pt has a first cousin who is paranoid schizophrenic.   ?No family history of depression or anxiety.   ?Pt's family has history of alcoholism.   ? ?Sexual Orientation: Straight ? ?Relationship Status: married  ?Name of spouse / other:Gregory Wilkins ?If a parent, number of children / ages:one adult son ? ?Support Systems: friends ?Pt considers therapy part of his support system.  ? ?Financial Stress:  No  ? 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 ?Military Service: No  ? ?Educational  History: ?Education: college graduate ? ?Religion/Sprituality/World View: ?Protestant ? ?Any cultural differences that may affect / interfere with treatment:  not applicable  ? ?Recreation/Hobbies: reading ? ?Stressors: Marital or family conflict   ? ?Strengths: Hopefulness, Self Advocate, and Able to Communicate Effectively ? ?Barriers:  none  ? ?Legal History: ?Pending legal issue / charges: The patient has no significant history of legal issues. ?History of legal issue / charges:  n/a ? ?Medical History/Surgical History: reviewed ?Past Medical History:  ?Diagnosis Date  ? 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 Qualifier: Diagnosis of  By: Linna Darner MD, Gwyndolyn Saxon   Dr Crista Luria   Formatting of this note might be different from the original. Overview:  Qualifier: Diagnosis of  By: Linna Darner MD, Gwyndolyn Saxon   Dr Crista Luria Overview:  Overview:  Qualifier: Diagnosis of  By: Linna Darner MD, Gwyndolyn Saxon   Dr Crista Luria  Overview:  Qualifier: Diagnosis of  By: Linna Darner MD, Gwyndolyn Saxon   Dr Crista Luria Overview:  Overview:  Overview:  ? Alcohol abuse   ? Allergic rhinitis 12/23/2006  ? Qualifier: Diagnosis of  By: Linna Darner MD, Gwyndolyn Saxon   Onset:in high school Character: perennial but increased seasonally Triggers :cat, dust, pollen Allergy testing: Dr Bernita Buffy Maintenance medications/ response:Flonase/ Nasonex; saline wash; Allegra Smoking history:age 37- 25, up to 2 ppd Family history pulmonary disease: no    Formatting of this note might be different from the original. [place] Pupa  ? Anemia   ? Annual physical exam 01/25/2020  ? Anxiety 01/18/2018  ? 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 Atrial flutter, paroxysmal ([place])   ? BENIGN PROSTATIC HYPERTROPHY 11/09/2008  ? Qualifier: Diagnosis of  By: Linna Darner MD, Gwyndolyn Saxon   No PMH of elevated PSA ; no biopsy    ? Blood donor 01/03/2014  ?   He he donates blood twice a year as "double red"   ? BPH (benign prostatic hyperplasia)   ? Chest pain   ? 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  Coronary artery calcification 10/12/2020  ? COVID-19 04/05/2021  ? Diverticulosis of colon 11/09/2008  ? Qualifier: Diagnosis of  By: Linna Darner MD, Susann Givens of this note might be different from the original. Overview:  Qualifier: Diagnosis of  By: Linna Darner MD, Susann Givens of this note might be different from the original. Overview:  Qualifier: Diagnosis of  By: Linna Darner MD, [address] 10/01 Updates  ? Enlarged prostate without lower urinary tract symptoms (luts) 11/09/2008  ? Formatting of this note might be different from the original. Overview:  Qualifier: Diagnosis of  By: Linna Darner MD, Gwyndolyn Saxon   No PMH of elevated PSA ; no biopsy Overview:  Overview:  Qualifier: Diagnosis of  By: Linna Darner MD, Gwyndolyn Saxon   No PMH of elevated PSA ; no biopsy  Overview:  Overview:  Overview:  Qualifier: Diagnosis of  By: Linna Darner MD, Gwyndolyn Saxon   No PMH of elevated PSA ; no biopsy  ? Essential (primary) hypertension 09/21/2007  ? Qualifier: Diagnosis of  By: Linna Darner MD, Susann Givens of this note might be different from the original. Overview:  Qualifier: Diagnosis of  By: Linna Darner MD, [place]:  Blood pressure appears to be well controlled. Renal function will be checked.  He has a minor cough; this does not appear to be significant enough to warrant change to losartan Overview:  Overview:  Qu  ? Family history of type C viral hepatitis 06/30/2012  ? Brother had liver transplant   ? Hepatitis A 04/2019  ? History of colonic polyps 12/23/2006  ? Qualifier: Diagnosis of  By: Linna Darner MD, Gwyndolyn Saxon   Dr Earlie Raveling, GI Polypectomy X2 , 4/16 /2014. One was  transitional cell adenoma Due annually No FH colon cancer   Formatting of this note might be different from the original. Overview:  Qualifier: Diagnosis of  By: Linna Darner MD, Gwyndolyn Saxon   Dr Earlie Raveling, GI Polypectomy X2 , 4/16 /2014. 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 History of urinary stone 09/21/2007  ? Qualifier: Diagnosis of   By: Linna Darner MD, Jessie Foot , passed spontaneously No Urologist involvement   Formatting of this note might be different from the original. Overview:  Qualifier: Diagnosis of  By: Linna Darner MD, Jessie Foot , passed sponta

## 2021-10-09 NOTE — Telephone Encounter (Signed)
? ?  Pre-operative Risk Assessment  ?  ?Patient Name: Gregory Wilkins  ?DOB: [birth year] ?MRN: [id number]  ? ?  ? ?Request for Surgical Clearance   ? ?Procedure:   LEFT KNEE SCOPE PMM ? ?Date of Surgery:  Clearance TBD                              ?   ?Surgeon:  DR. Victorino December ?Surgeon's Group or Practice Name:  [facility] ?Phone number: [phone] ?Fax number:  [phone] ?  ?Type of Clearance Requested:   ?- Pharmacy:  Hold Apixaban (Eliquis) Eliquis ?  ?Type of Anesthesia:  Not Indicated ?  ?Additional requests/questions:   None ? ?Signed, ?Toni Arthurs   ?10/09/2021, 10:20 AM   ?

## 2021-10-09 NOTE — Telephone Encounter (Signed)
Pt has been scheduled for Preop APP 10/10/21 at 1:00. ?Consent on file / medications reconciled. ? ?  ?Patient Consent for Virtual Visit  ? ? ?   ? ?Gregory Wilkins has provided verbal consent on 10/09/2021 for a virtual visit (video or telephone). ? ? ?CONSENT FOR VIRTUAL VISIT FOR:  Gregory Wilkins  ?By participating in this virtual visit I agree to the following: ? ?I hereby voluntarily request, consent and authorize [facility] and its employed or contracted physicians, physician assistants, nurse practitioners or other licensed health care professionals (the Practitioner), to provide me with telemedicine health care services (the ?Services") as deemed necessary by the treating Practitioner. I acknowledge and consent to receive the Services by the Practitioner via telemedicine. I understand that the telemedicine visit will involve communicating with the Practitioner through live audiovisual communication technology and the disclosure of certain medical information by electronic transmission. I acknowledge that I have been given the opportunity to request an in-person assessment or other available alternative prior to the telemedicine visit and am voluntarily participating in the telemedicine visit. ? ?I understand that I have the right to withhold or withdraw my consent to the use of telemedicine in the course of my care at any time, without affecting my right to future care or treatment, and that the Practitioner or I may terminate the telemedicine visit at any time. I understand that I have the right to inspect all information obtained and/or recorded in the course of the telemedicine visit and may receive copies of available information for a reasonable fee.  I understand that some of the potential risks of receiving the Services via telemedicine include:  ?Delay or interruption in medical evaluation due to technological equipment failure or disruption; ?Information transmitted may not be sufficient  (e.g. poor resolution of images) to allow for appropriate medical decision making by the Practitioner; and/or  ?In rare instances, security protocols could fail, causing a breach of personal health information. ? ?Furthermore, I acknowledge that it is my responsibility to provide information about my medical history, conditions and care that is complete and accurate to the best of my ability. I acknowledge that Practitioner's advice, recommendations, and/or decision may be based on factors not within their control, such as incomplete or inaccurate data provided by me or distortions of diagnostic images or specimens that may result from electronic transmissions. I understand that the practice of medicine is not an exact science and that Practitioner makes no warranties or guarantees regarding treatment outcomes. I acknowledge that a copy of this consent can be made available to me via my patient portal ([facility]), or I can request a printed copy by calling the office of [place].   ? ?I understand that my insurance will be billed for this visit.  ? ?I have read or had this consent read to me. ?I understand the contents of this consent, which adequately explains the benefits and risks of the Services being provided via telemedicine.  ?I have been provided ample opportunity to ask questions regarding this consent and the Services and have had my questions answered to my satisfaction. ?I give my informed consent for the services to be provided through the use of telemedicine in my medical care ? ? ? ?

## 2021-10-09 NOTE — Telephone Encounter (Signed)
Please arrange telephone visit after clinical pharmacist comment on anticoagulation holding time.  Probably around tomorrow afternoon or Friday to allow adequate time for our clinical pharmacist to comment on Eliquis ?

## 2021-10-09 NOTE — Telephone Encounter (Signed)
Patient with diagnosis of afib on Eliquis for anticoagulation.   ? ?Procedure: LEFT KNEE SCOPE PMM ?Date of procedure: TBD ? ?CHA2DS2-VASc Score = 4  ? This indicates a 4.8% annual risk of stroke. ?The patient's score is based upon: ?CHF History: 0 ?HTN History: 1 ?Diabetes History: 0 ?Stroke History: 0 ?Vascular Disease History: 1 ?Age Score: 2 ?Gender Score: 0 ?  ?  ? ?CrCl 71 ml/min ? ?Per office protocol, patient can hold Eliquis for 3 days prior to procedure.   ? ? ?

## 2021-10-10 ENCOUNTER — Other Ambulatory Visit: Payer: Self-pay

## 2021-10-10 ENCOUNTER — Ambulatory Visit (INDEPENDENT_AMBULATORY_CARE_PROVIDER_SITE_OTHER): Payer: Medicare Other | Admitting: Student

## 2021-10-10 ENCOUNTER — Encounter: Payer: Self-pay | Admitting: Student

## 2021-10-10 VITALS — BP 139/56 | HR 55

## 2021-10-10 DIAGNOSIS — Z01810 Encounter for preprocedural cardiovascular examination: Secondary | ICD-10-CM

## 2021-10-10 NOTE — Progress Notes (Signed)
Virtual Visit via Telephone Note   This visit type was conducted due to national recommendations for restrictions regarding the COVID-19 Pandemic (e.g. social distancing) in an effort to limit this patient's exposure and mitigate transmission in our community.  Due to his co-morbid illnesses, this patient is at least at moderate risk for complications without adequate follow up.  This format is felt to be most appropriate for this patient at this time.  The patient did not have access to video technology/had technical difficulties with video requiring transitioning to audio format only (telephone).  All issues noted in this document were discussed and addressed.  No physical exam could be performed with this format.  Please refer to the patient's chart for his  consent to telehealth for [facility]. Evaluation Performed:  Preoperative cardiovascular risk assessment  This visit type was conducted due to national recommendations for restrictions regarding the COVID-19 Pandemic (e.g. social distancing).  This format is felt to be most appropriate for this patient at this time.  All issues noted in this document were discussed and addressed.  No physical exam was performed (except for noted visual exam findings with Video Visits).  Please refer to the patient's chart (MyChart message for video visits and phone note for telephone visits) for the patient's consent to telehealth for [facility]. _____________   Date:  10/10/2021   Patient ID:  Gregory Wilkins, DOB [birth year], MRN [id number] Patient Location:  Home Provider location:   Office  Primary Care Provider:  Wanda Plump, MD Primary Cardiologist:  Dr. Tomie China  Chief Complaint    Gregory Wilkins is a 81 y.o. male with a history of atrial fibrillation/flutter on Eliquis, ascending aorta dilatation, hypertension, hyperlipidemia, obstructive sleep apnea on CPAP, BPH, hepatitis A, obesity, and alcohol abuse who is pending a left knee  scope PMM and presents today for telephonic preoperative cardiovascular risk assessment.  Past Medical History    Past Medical History:  Diagnosis Date   Acne rosacea 12/23/2006   Qualifier: Diagnosis of  By: Alwyn Ren MD, Chrissie Noa   Dr Campbell Stall   Formatting of this note might be different from the original. Overview:  Qualifier: Diagnosis of  By: Alwyn Ren MD, Chrissie Noa   Dr Campbell Stall Overview:  Overview:  Qualifier: Diagnosis of  By: Alwyn Ren MD, Chrissie Noa   Dr Campbell Stall  Overview:  Qualifier: Diagnosis of  By: Alwyn Ren MD, Chrissie Noa   Dr Campbell Stall Overview:  Overview:  Overview:   Alcohol abuse    Allergic rhinitis 12/23/2006   Qualifier: Diagnosis of  By: Alwyn Ren MD, Chrissie Noa   Onset:in high school Character: perennial but increased seasonally Triggers :cat, dust, pollen Allergy testing: Dr Jethro Bolus Maintenance medications/ response:Flonase/ Nasonex; saline wash; Allegra Smoking history:age 75- 42, up to 2 ppd Family history pulmonary disease: no    Formatting of this note might be different from the original. 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 Overview:  Qualifier: Diagnosis of  By: Alwyn Ren MD, Gae Dry of this note might be different from the original. Overview:  Qualifier: Diagnosis of  By: Alwyn Ren MD, Duard Brady 10/01 Updates   Enlarged  prostate without lower urinary tract symptoms (luts) 11/09/2008   Formatting of this note might be different from the original. Overview:  Qualifier: Diagnosis of  By: Alwyn Ren MD, Chrissie Noa   No PMH of elevated PSA ; no biopsy Overview:  Overview:  Qualifier: Diagnosis of  By: Alwyn Ren MD, Chrissie Noa   No PMH of elevated PSA ; no biopsy Overview:  Overview:  Overview:  Qualifier: Diagnosis of  By: Alwyn Ren MD, Chrissie Noa   No PMH of elevated PSA ; no biopsy   Essential (primary) hypertension 09/21/2007   Qualifier: Diagnosis of  By: Alwyn Ren MD, Gae Dry of this note might be different from the original. Overview:  Qualifier: Diagnosis of  By: Alwyn Ren MD, Chrissie Noa   Last Assessment & Plan:  Blood pressure appears to be well controlled. Renal function will be checked.  He has a minor cough; this does not appear to be significant enough to warrant change to losartan Overview:  Overview:  Qu   Family history of type C viral hepatitis 06/30/2012   Brother had liver transplant    Hepatitis A 04/2019   History of colonic polyps 12/23/2006   Qualifier: Diagnosis of  By: Alwyn Ren MD, Chrissie Noa   Dr Ritta Slot, GI Polypectomy X2 , 4/16 /2014. One was  transitional cell adenoma Due annually No FH colon cancer   Formatting of this note might be different from the original. Overview:  Qualifier: Diagnosis of  By: Alwyn Ren MD, Chrissie Noa   Dr Ritta Slot, GI Polypectomy X2 , 4/16 /2014. One was  transitional cell adenoma Due annually No FH colon ca   History of urinary stone 09/21/2007   Qualifier: Diagnosis of  By: Alwyn Ren MD, Francene Finders , passed spontaneously No Urologist involvement   Formatting of this note might be different from the original. Overview:  Qualifier: Diagnosis of  By: Alwyn Ren MD, Francene Finders , passed spontaneously No Urologist involvement Overview:  Overview:  Qualifier: Diagnosis of  By: Alwyn Ren MD, Francene Finders , passed spontaneously No Urologist involvement    HYPERLIPIDEMIA 09/21/2007   Qualifier:  Diagnosis of  By: Alwyn Ren MD, [place] Parents Lipoprofile 2006 : LDL  108 ( 1072  / 575  ),  HDL 63 , Triglycerides  40. LDL goal = < 135 ; ideally < 105. No FH CAD   Formatting of this note might be different from the original. Overview:  Qualifier: Diagnosis of  By: Alwyn Ren MD, Lynder Parents Lipoprofile 2006 : LDL  108 ( 1072  / 575  ),  HDL 63 , Triglycerides  40. LDL goal = < 135 ; idea   Hypertension    Joint pain    Liver injury 04/2019   Liver injury, initial encounter 05/17/2019   Macular degeneration 01/06/2018   Malignant neoplasm of skin    Mallet finger 2012     4th L DIP,Dr Sypher   Mixed dyslipidemia 03/23/2020   Nephrolithiasis    Nocturnal hypoxia 11/04/2019   Nuclear sclerotic cataract of both eyes  Obesity (BMI 30-39.9) 01/04/2014   OSA (obstructive sleep apnea) 05/17/2020   Osteoarthritis    Overweight 03/23/2020   Pain in left foot 06/28/2020   PCP NOTES >>>>>>>>>>>>>>>> 05/26/2019   Personal history of other malignant neoplasm of skin 06/30/2012   Dr Karlyn Agee, Derm X3 Local resection from  right forearm and forehead.   Mohs surgery of the nose 06/2012, Dr Polly Cobia of this note might be different from the original. Overview:  Dr Karlyn Agee, Derm X3 Local resection from  right forearm and forehead.   Mohs surgery of the nose 06/2012, Dr Irene Limbo Overview:  Overview:  Dr Karlyn Agee, Derm X3 Local resection from  right forearm and fo   RLS (restless legs syndrome)    Sleep apnea    Snoring 02/01/2014   Formatting of this note might be different from the original. Last Assessment & Plan:  The patient has been noted to have significant snoring, but no one has ever really commented on an abnormal breathing pattern during sleep. Although he is overweight and does have restless sleep, the rest of his history is not overly impressive for sleep disordered breathing. If he does have sleep apnea, I suspe   Past Surgical History:  Procedure Laterality Date   basal cell  cancer     X 3   INGUINAL HERNIA REPAIR  [age]   right,left   OPEN REDUCTION INTERNAL FIXATION (ORIF) METACARPAL Right 05/19/2013   Procedure: OPEN REDUCTION INTERNAL FIXATION  RIGHT SMALL  METACARPAL FRACTURE;  Surgeon: Wyn Forster., MD;  Location: [facility];  Service: Orthopedics;  Laterality: Right;   POLYPECTOMY     X 3; Dr Kinnie Scales   WISDOM TOOTH EXTRACTION      Allergies  Allergies  Allergen Reactions   Penicillins Hives    hives   Ramipril Anaphylaxis and Swelling    History of Present Illness    Gregory Wilkins is a 81 y.o. male who presents via audio/video conferencing for a telehealth visit today.  Patient was last seen in cardiology clinic on 05/27/2021 by Dr. Tomie China.  At that time, [facility] Wilkins was doing well.  He is now pending left knee scope PMM . Since his last visit, he has been doing well from a cardiac standpoint. He is asymptomatic with his atrial fibrillation. No significant palpitation, lightheadedness/dizziness, or syncope. No chest pain, shortness of breath, orthopnea, PND, or edema. He is able to complete >4.0 METS without any problems.  Home Medications    Prior to Admission medications   Medication Sig Start Date End Date Taking? Authorizing Provider  aspirin EC 81 MG tablet Take 1 tablet (81 mg total) by mouth daily. Swallow whole. 10/12/20   Revankar, Aundra Dubin, MD  atorvastatin (LIPITOR) 10 MG tablet TAKE 1 TABLET BY MOUTH EVERYDAY AT BEDTIME 05/06/21   Wanda Plump, MD  cetirizine (ZYRTEC) 10 MG tablet Take 10 mg by mouth daily.    [provider]  clonazePAM (KLONOPIN) 0.5 MG tablet TAKE 1 TABLET BY MOUTH EVERY NIGHT AT BEDTIME AS NEEDED FOR RESTLESS LEG 07/26/21   Wanda Plump, MD  ELIQUIS 5 MG TABS tablet TAKE 1 TABLET BY MOUTH TWICE A DAY 10/01/21   Revankar, Aundra Dubin, MD  hydrochlorothiazide (HYDRODIURIL) 12.5 MG tablet TAKE 1 TABLET BY MOUTH EVERY DAY 07/26/21   Wanda Plump, MD  losartan (COZAAR) 25 MG tablet  TAKE 1 TABLET (25 MG TOTAL) BY MOUTH DAILY. 07/15/21  Revankar, Aundra Dubin, MD  metoprolol tartrate (LOPRESSOR) 50 MG tablet Take 12.5 mg by mouth as needed for heart rate control (fast heart rate).    [provider]  Multiple Vitamin (MULTIVITAMIN WITH MINERALS) TABS tablet Take 1 tablet by mouth daily.    [provider]  Multiple Vitamins-Minerals (ICAPS AREDS 2 PO) Take 1 tablet by mouth daily in the afternoon.    [provider]  Vitamin D, Ergocalciferol, (DRISDOL) 1.25 MG (50000 UNIT) CAPS capsule Take 1 capsule (50,000 Units total) by mouth every 7 (seven) days. 08/26/21   Roswell Nickel, DO    Physical Exam    Vital Signs: BP 139/56 mmHg and  HR 55 bpm   Given telephonic nature of communication, physical exam is limited: Alert and oriented x3. No acute distress. Normal affect. Speech and respirations are unlabored.  Accessory Clinical Findings    None  Assessment & Plan    Preoperative Cardiovascular Risk Assessment: Patient has upcoming knee procedure planned. He is doing well from a cardiac standpoint without any angina, acute CHF symptoms, palpitations, or syncope. Therefore, based on ACC/AHA guidelines, patient would be at acceptable risk for the planned procedure without further cardiovascular testing. Per Pharmacy and office protocol, patient can hold Eliquis for 3 days prior to procedure. Patient is also on Aspirin which he takes for primary prevention. If needed, okay to hold Aspirin for 5-7 days prior to procedure as well. Please restart these as soon as safely possible afterwards. I will route this recommendation to the requesting party via Epic fax function.   Time:   Today, I have spent 11 minutes with the patient with telehealth technology discussing medical history, symptoms, and management plan.     Corrin Parker, PA-C  10/10/2021, 1:15 PM

## 2021-10-21 ENCOUNTER — Ambulatory Visit (INDEPENDENT_AMBULATORY_CARE_PROVIDER_SITE_OTHER): Payer: Medicare Other | Admitting: Bariatrics

## 2021-10-21 ENCOUNTER — Encounter (INDEPENDENT_AMBULATORY_CARE_PROVIDER_SITE_OTHER): Payer: Self-pay | Admitting: Bariatrics

## 2021-10-21 ENCOUNTER — Other Ambulatory Visit: Payer: Self-pay

## 2021-10-21 VITALS — BP 145/74 | HR 60 | Temp 98.0°F | Ht 69.0 in | Wt 194.0 lb

## 2021-10-21 DIAGNOSIS — E669 Obesity, unspecified: Secondary | ICD-10-CM | POA: Diagnosis not present

## 2021-10-21 DIAGNOSIS — E782 Mixed hyperlipidemia: Secondary | ICD-10-CM

## 2021-10-21 DIAGNOSIS — E7849 Other hyperlipidemia: Secondary | ICD-10-CM

## 2021-10-21 DIAGNOSIS — Z6828 Body mass index (BMI) 28.0-28.9, adult: Secondary | ICD-10-CM

## 2021-10-21 DIAGNOSIS — E559 Vitamin D deficiency, unspecified: Secondary | ICD-10-CM

## 2021-10-21 MED ORDER — VITAMIN D (ERGOCALCIFEROL) 1.25 MG (50000 UNIT) PO CAPS: 50000.0000 [IU] | ORAL_CAPSULE | ORAL | 0 refills | Status: DC

## 2021-10-22 ENCOUNTER — Encounter (INDEPENDENT_AMBULATORY_CARE_PROVIDER_SITE_OTHER): Payer: Self-pay | Admitting: Bariatrics

## 2021-10-22 NOTE — Progress Notes (Signed)
? ? ? ?Chief Complaint:  ? ?OBESITY ?Gregory Wilkins is here to discuss his progress with his obesity treatment plan along with follow-up of his obesity related diagnoses. Gregory Wilkins is on the Category 3 Plan and states he is following his eating plan approximately 50% of the time. Gregory Wilkins states he is cardio for 60 minutes 5 times per week, yoga for 60 for 60 minutes 5 times per week . ? ?Today's visit was #: 15 ?Starting weight: 230 lbs ?Starting date: 09/17/2020 ?Today's weight: 194 lbs ?Today's date: 10/21/2021 ?Total lbs lost to date: 36 lbs ?Total lbs lost since last in-office visit: 1 lb ? ?Interim History: Gregory Wilkins is down 1 additional pound since his last visit. He states that he is "tready water" with his plan.  ? ?Subjective:  ? ?1. Vitamin D deficiency ?Gregory Wilkins is taking Vitamin D as directed.  ? ?2. Hyperlipidemia ?Gregory Wilkins is taking Lipitor currently.  ? ?Assessment/Plan:  ? ?1. Vitamin D deficiency ?Low Vitamin D level contributes to fatigue and are associated with obesity, breast, and colon cancer. We will refill prescription Vitamin D 50,000 IU every week for 1 month with no refills and Chen will follow-up for routine testing of Vitamin D, at least 2-3 times per year to avoid over-replacement. ? ?- Vitamin D, Ergocalciferol, (DRISDOL) 1.25 MG (50000 UNIT) CAPS capsule; Take 1 capsule (50,000 Units total) by mouth every 7 (seven) days.  Dispense: 4 capsule; Refill: 0 ? ?2. Hyperlipidemia ?Cardiovascular risk and specific lipid/LDL goals reviewed.  Gregory Wilkins will continue taking Lipitor. We discussed several lifestyle modifications today and Gregory Wilkins will continue to work on diet, exercise and weight loss efforts. Orders and follow up as documented in patient record.  ? ?Counseling ?Intensive lifestyle modifications are the first line treatment for this issue. ?Dietary changes: Increase soluble fiber. Decrease simple carbohydrates. ?Exercise changes: Moderate to vigorous-intensity aerobic activity 150 minutes per week if  tolerated. ?Lipid-lowering medications: see documented in medical record. ? ?3. obesity with current BMI 28.7 ?Gregory Wilkins is currently in the action stage of change. As such, his goal is to continue with weight loss efforts. He has agreed to the Category 3 Plan.  ? ?Exercise goals:  Gregory Wilkins will continue meal planning and she will continue intentional eating.  He will increase water and protein. Handout on vegetable was provided today.  ? ?Behavioral modification strategies: increasing lean protein intake, decreasing simple carbohydrates, increasing vegetables, increasing water intake, decreasing eating out, no skipping meals, meal planning and cooking strategies, keeping healthy foods in the home, and planning for success. ? ?Gregory Wilkins has agreed to follow-up with our clinic in 4 weeks. He was informed of the importance of frequent follow-up visits to maximize his success with intensive lifestyle modifications for his multiple health conditions.  ? ?Objective:  ? ?Blood pressure (!) 145/74, pulse 60, temperature 98 ?F (36.7 ?C), height '5\' 9"'$  (1.753 m), weight 194 lb (88 kg), SpO2 92 %. ?Body mass index is 28.65 kg/m?. ? ?General: Cooperative, alert, well developed, in no acute distress. ?HEENT: Conjunctivae and lids unremarkable. ?Cardiovascular: Regular rhythm.  ?Lungs: Normal work of breathing. ?Neurologic: No focal deficits.  ? ?Lab Results  ?Component Value Date  ? CREATININE 0.96 08/26/2021  ? BUN 12 08/26/2021  ? NA 145 (H) 08/26/2021  ? K 4.4 08/26/2021  ? CL 101 08/26/2021  ? CO2 29 08/26/2021  ? ?Lab Results  ?Component Value Date  ? ALT 10 08/26/2021  ? AST 13 08/26/2021  ? ALKPHOS 88 08/26/2021  ? BILITOT 0.5 08/26/2021  ? ?  Lab Results  ?Component Value Date  ? HGBA1C 5.3 09/17/2020  ? HGBA1C 5.3 11/10/2008  ? ?Lab Results  ?Component Value Date  ? INSULIN 9.4 09/17/2020  ? ?Lab Results  ?Component Value Date  ? TSH 1.600 09/17/2020  ? ?Lab Results  ?Component Value Date  ? CHOL 130 08/26/2021  ? HDL 67 08/26/2021  ?  [place] 54 08/26/2021  ? LDLDIRECT 124.6 11/10/2008  ? TRIG 33 08/26/2021  ? CHOLHDL 2 02/26/2021  ? ?Lab Results  ?Component Value Date  ? VD25OH 55.9 08/26/2021  ? VD25OH 22.6 (L) 09/17/2020  ? ?Lab Results  ?Component Value Date  ? WBC 6.5 08/28/2020  ? HGB 14.5 08/28/2020  ? HCT 43.8 08/28/2020  ? MCV 90.3 08/28/2020  ? PLT 204.0 08/28/2020  ? ?Lab Results  ?Component Value Date  ? IRON 96 04/19/2015  ? FERRITIN 36.2 04/19/2015  ? ?Attestation Statements:  ? ?Reviewed by clinician on day of visit: allergies, medications, problem list, medical history, surgical history, family history, social history, and previous encounter notes. ? ?I, Lizbeth Bark, RMA, am acting as transcriptionist for [employer], DO. ? ?I have reviewed the above documentation for accuracy and completeness, and I agree with the above. Jearld Lesch, DO ? ?

## 2021-10-23 ENCOUNTER — Ambulatory Visit: Payer: Medicare Other | Admitting: Psychology

## 2021-11-06 ENCOUNTER — Ambulatory Visit (INDEPENDENT_AMBULATORY_CARE_PROVIDER_SITE_OTHER): Payer: Medicare Other | Admitting: Psychology

## 2021-11-06 DIAGNOSIS — F4322 Adjustment disorder with anxiety: Secondary | ICD-10-CM | POA: Diagnosis not present

## 2021-11-06 NOTE — Progress Notes (Signed)
[place] Counselor/Therapist Progress Note ? ?Patient ID: LINKYN GOBIN, MRN: [id number],   ? ?Date: 11/06/2021 ? ?Time Spent: 9:00am - 9:50am   50 minutes  ? ?Treatment Type: Individual Therapy ? ?Reported Symptoms: stress ? ?Mental Status Exam: ?Appearance:  Casual     ?Behavior: Appropriate  ?Motor: Normal  ?Speech/Language:  Normal Rate  ?Affect: Appropriate  ?Mood: normal  ?Thought process: normal  ?Thought content:   WNL  ?Sensory/Perceptual disturbances:   WNL  ?Orientation: oriented to person, place, time/date, and situation  ?Attention: Good  ?Concentration: Good  ?Memory: WNL  ?Fund of knowledge:  Good  ?Insight:   Good  ?Judgment:  Good  ?Impulse Control: Good  ? ?Risk Assessment: ?Danger to Self:  No ?Self-injurious Behavior: No ?Danger to Others: No ?Duty to Warn:no ?Physical Aggression / Violence:No  ?Access to Firearms a concern: No  ?Gang Involvement:No  ? ?Subjective:  ?Pt present for face-to-face individual therapy via video Webex.  Pt consents to telehealth video session due to COVID 19 pandemic. ?Location of pt: home ?Location of therapist: home office.   ?Pt talked about his health.   He had orthoscopic surgery on his knee a couple of weeks ago.   The surgery went well and he has been healing.  Pt is in physical therapy to help him rehab.   ?Pt states he has been working on his assignment to try to be gentler with himself and forgive himself.   It has been difficult for him.  Addressed the barriers to being gentler with himself.   It is hard for him to let go.  Worked on how to let go of self criticism and self blame.   Pt tends to ruminate about problems he has or think he could have in the future.  Worked on thought reframing.   ?Pt talked about his son Dade.  Cylus has started to go back to therapy.  Pt is hoping that Peace follows through with getting help.  Pt feels guilty bc he has a 24 yo son who is a drug addict and can't take care of himself.   ?Provided supportive  therapy.    ? ?Interventions: Cognitive Behavioral Therapy and Insight-Oriented ? ?Diagnosis: F43.22 ? ? ?Plan of Care: Pt participated in setting treatment goals.   He is progressing toward goals.   Plan to continue to meet every two weeks.  ? ?Treatment Plan  (Treatment Plan Target Date 10/10/2022) ?Client Abilities/Strengths  ?Pt is bright, engaging and motivated for therapy.  ?Client Treatment Preferences  ?Individual therapy.  ?Client Statement of Needs  ?Improve coping skills.  ?Symptoms  ?Autonomic hyperactivity (e.g., palpitations, shortness of breath, dry mouth, trouble swallowing, nausea, diarrhea). Excessive and/or unrealistic worry that is difficult to control occurring more days than not for at least 6 months about a number of events or activities. Hypervigilance (e.g., feeling constantly on edge, experiencing concentration difficulties, having trouble falling or staying asleep, exhibiting a general state of irritability). Motor tension (e.g., restlessness, tiredness, shakiness, muscle tension). ?Problems Addressed  ?Anxiety ?Goals ?1. Enhance ability to effectively cope with the full variety of life's worries and anxieties. ?2. Learn and implement coping skills that result in a reduction of anxiety and worry, and improved daily functioning. ?Objective ?Learn to accept limitations in life and commit to tolerating, rather than avoiding, unpleasant emotions while accomplishing meaningful goals. ?Target Date: 2022-10-10 Frequency: Biweekly ?Progress: 50 Modality: individual ?Related Interventions ?1. Use techniques from Acceptance and Commitment Therapy to help client accept  uncomfortable realities such as lack of complete control, imperfections, and uncertainty and tolerate unpleasant emotions and thoughts in order to accomplish value-consistent goals. ?Objective ?Learn and implement problem-solving strategies for realistically addressing worries. ?Target Date: 2022-10-10 Frequency: Biweekly ?Progress:  50 Modality: individual ?Related Interventions ?1. Assign the client a homework exercise in which he/she problem-solves a current problem.  review, reinforce success, and provide corrective feedback toward improvement. ?2. Teach the client problem-solving strategies involving specifically defining a problem, generating options for addressing it, evaluating the pros and cons of each option, selecting and implementing an optional action, and reevaluating and refining the action. ?Objective ?Learn and implement calming skills to reduce overall anxiety and manage anxiety symptoms. ?Target Date: 2022-10-10 Frequency: Biweekly ?Progress: 50 Modality: individual ?Related Interventions ?1. Assign the client to read about progressive muscle relaxation and other calming strategies in relevant books or treatment manuals (e.g., Progressive Relaxation Training by Gwynneth Aliment and Dani Gobble; Mastery of Your Anxiety and Worry: Workbook by Beckie Busing). ?2. Assign the client homework each session in which he/she practices relaxation exercises daily, gradually applying them progressively from non-anxiety-provoking to anxiety-provoking situations; review and reinforce success while providing corrective feedback toward improvement. ?3. Teach the client calming/relaxation skills (e.g., applied relaxation, progressive muscle relaxation, cue controlled relaxation; mindful breathing; biofeedback) and how to discriminate better between relaxation and tension; teach the client how to apply these skills to his/her daily life. ?3. Reduce overall frequency, intensity, and duration of the anxiety so that daily functioning is not impaired. ?4. Resolve the core conflict that is the source of anxiety. ?5. Stabilize anxiety level while increasing ability to function on a daily basis. ?Diagnosis :    F43.22 ?Conditions For Discharge ?Achievement of treatment goals and objectives. ? ?Zurich Carreno, LCSW ? ? ? ?

## 2021-11-19 ENCOUNTER — Ambulatory Visit (INDEPENDENT_AMBULATORY_CARE_PROVIDER_SITE_OTHER): Payer: Medicare Other | Admitting: Cardiology

## 2021-11-19 ENCOUNTER — Encounter: Payer: Self-pay | Admitting: Cardiology

## 2021-11-19 VITALS — BP 130/62 | HR 60 | Ht 69.0 in | Wt 196.1 lb

## 2021-11-19 DIAGNOSIS — I2584 Coronary atherosclerosis due to calcified coronary lesion: Secondary | ICD-10-CM | POA: Diagnosis not present

## 2021-11-19 DIAGNOSIS — E782 Mixed hyperlipidemia: Secondary | ICD-10-CM

## 2021-11-19 DIAGNOSIS — I10 Essential (primary) hypertension: Secondary | ICD-10-CM

## 2021-11-19 DIAGNOSIS — I2510 Atherosclerotic heart disease of native coronary artery without angina pectoris: Secondary | ICD-10-CM | POA: Diagnosis not present

## 2021-11-19 NOTE — Progress Notes (Signed)
?Cardiology Office Note:   ? ?Date:  11/19/2021  ? ?ID:  Gregory Wilkins, DOB [birth year], MRN [id number] ? ?PCP:  Colon Branch, MD  ?Cardiologist:  Jenean Lindau, MD  ? ?Referring MD: Colon Branch, MD  ? ? ?ASSESSMENT:   ? ?1. Essential (primary) hypertension   ?2. Coronary artery calcification   ?3. Mixed dyslipidemia   ? ?PLAN:   ? ?In order of problems listed above:  ?Coronary artery calcification: Secondary prevention stressed with the patient.  Importance of compliance with diet medication stressed any vocalized understanding.  He was advised to exercise at least half an hour a day 5 days a week and he promises to do so. ?Paroxysmal atrial fibrillation: Patient has had no palpitations or any such issues and is happy with it.I discussed with the patient atrial fibrillation, disease process. Management and therapy including rate and rhythm control, anticoagulation benefits and potential risks were discussed extensively with the patient. Patient had multiple questions which were answered to patient's satisfaction. ?Mixed dyslipidemia: On lipid-lowering therapy and tolerating it well.  Lipids were reviewed and discussed with him. ?Essential hypertension: Blood pressure stable and diet was emphasized.  Lifestyle modification urged. ?Patient will be seen in follow-up appointment in 6 months or earlier if the patient has any concerns ? ? ? ?Medication Adjustments/Labs and Tests Ordered: ?Current medicines are reviewed at length with the patient today.  Concerns regarding medicines are outlined above.  ?No orders of the defined types were placed in this encounter. ? ?No orders of the defined types were placed in this encounter. ? ? ? ?No chief complaint on file. ?  ? ?History of Present Illness:   ? ?Gregory Wilkins is a 81 y.o. male.  Patient has past medical history of essential hypertension, mixed dyslipidemia and paroxysmal atrial fibrillation.  He has coronary artery calcification on CT scan.  He denies any  chest pain orthopnea or PND.  He takes care of activities of daily living and exercise on a regular basis.  At the time of my evaluation, the patient is alert awake oriented and in no distress. ? ?Past Medical History:  ?Diagnosis Date  ? 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 Qualifier: Diagnosis of  By: Linna Darner MD, Gwyndolyn Saxon   Dr Crista Luria   Formatting of this note might be different from the original. Overview:  Qualifier: Diagnosis of  By: Linna Darner MD, Gwyndolyn Saxon   Dr Crista Luria Overview:  Overview:  Qualifier: Diagnosis of  By: Linna Darner MD, Gwyndolyn Saxon   Dr Crista Luria  Overview:  Qualifier: Diagnosis of  By: Linna Darner MD, Gwyndolyn Saxon   Dr Crista Luria Overview:  Overview:  Overview:  ? Acute meniscal tear, lateral 10/01/2021  ? Alcohol abuse   ? Allergic rhinitis 12/23/2006  ? Qualifier: Diagnosis of  By: Linna Darner MD, Gwyndolyn Saxon   Onset:in high school Character: perennial but increased seasonally Triggers :cat, dust, pollen Allergy testing: Dr Bernita Buffy Maintenance medications/ response:Flonase/ Nasonex; saline wash; Allegra Smoking history:age 64- 89, up to 2 ppd Family history pulmonary disease: no    Formatting of this note might be different from the original. [place] Pupa  ? Anemia   ? Annual physical exam 01/25/2020  ? Anxiety 01/18/2018  ? 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 Atrial flutter, paroxysmal ([place])   ? BENIGN PROSTATIC HYPERTROPHY 11/09/2008  ? Qualifier: Diagnosis of  By: Linna Darner MD, Gwyndolyn Saxon   No PMH of elevated PSA ; no biopsy    ? Blood donor 01/03/2014  ?  He he donates blood twice a year as "double red"   ? BPH (benign prostatic hyperplasia)   ? Chest pain   ? 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 COPD (chronic obstructive pulmonary disease) ([place]) 08/27/2021  ? Coronary artery calcification 10/12/2020  ? Diverticulosis of colon 11/09/2008  ? Qualifier: Diagnosis of  By: Linna Darner MD, Susann Givens of this note might be different from the original. Overview:   Qualifier: Diagnosis of  By: Linna Darner MD, Susann Givens of this note might be different from the original. Overview:  Qualifier: Diagnosis of  By: Linna Darner MD, [address] [place] 10/01 Updates  ? Enlarged prostate without lower urinary tract symptoms (luts) 11/09/2008  ? Formatting of this note might be different from the original. Overview:  Qualifier: Diagnosis of  By: Linna Darner MD, Gwyndolyn Saxon   No PMH of elevated PSA ; no biopsy Overview:  Overview:  Qualifier: Diagnosis of  By: Linna Darner MD, Gwyndolyn Saxon   No PMH of elevated PSA ; no biopsy Overview:  Overview:  Overview:  Qualifier: Diagnosis of  By: Linna Darner MD, Gwyndolyn Saxon   No PMH of elevated PSA ; no biopsy  ? Essential (primary) hypertension 09/21/2007  ? Qualifier: Diagnosis of  By: Linna Darner MD, Susann Givens of this note might be different from the original. Overview:  Qualifier: Diagnosis of  By: Linna Darner MD, [place]:  Blood pressure appears to be well controlled. Renal function will be checked.  He has a minor cough; this does not appear to be significant enough to warrant change to losartan Overview:  Overview:  Qu  ? Family history of type C viral hepatitis 06/30/2012  ? Brother had liver transplant   ? Hepatitis A 04/2019  ? History of colonic polyps 12/23/2006  ? Qualifier: Diagnosis of  By: Linna Darner MD, Gwyndolyn Saxon   Dr Earlie Raveling, GI Polypectomy X2 , 4/16 /2014. One was  transitional cell adenoma Due annually No FH colon cancer   Formatting of this note might be different from the original. Overview:  Qualifier: Diagnosis of  By: Linna Darner MD, Gwyndolyn Saxon   Dr Earlie Raveling, GI Polypectomy X2 , 4/16 /2014. 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 History of urinary stone 09/21/2007  ? Qualifier: Diagnosis of  By: Linna Darner MD, Jessie Foot , passed spontaneously No Urologist involvement   Formatting of this note might be different from the original. Overview:  Qualifier: Diagnosis of  By: Linna Darner MD, Jessie Foot , passed  spontaneously No Urologist involvement Overview:  Overview:  Qualifier: Diagnosis of  By: Linna Darner MD, Jessie Foot , passed spontaneously No Urologist involvement   ? HYPERLIPIDEMIA 09/21/2007  ? Qualifier: Diagnosis of  By: Linna Darner MD, Cristopher Estimable Lipoprofile 2006 : LDL  108 ( 1072  / 575  ),  HDL 63 , Triglycerides  40. LDL goal = < 135 ; ideally < 105. No FH CAD   Formatting of this note might be different from the original. Overview:  Qualifier: Diagnosis of  By: Linna Darner MD, Cristopher Estimable Lipoprofile 2006 : LDL  108 ( 1072  / 575  ),  HDL 63 , Triglycerides  40. LDL goal = < 135 ; idea  ? Hypertension   ? Joint pain   ? Liver injury, initial encounter 05/17/2019  ? Macular degeneration 01/06/2018  ?  Malignant neoplasm of skin   ? Mallet finger 2012  ?   4th L DIP,Dr Sypher  ? Mixed dyslipidemia 03/23/2020  ? Nephrolithiasis   ? Nocturnal hypoxia 11/04/2019  ? Nuclear sclerotic cataract of both eyes   ? Obesity (BMI 30-39.9) 01/04/2014  ? OSA (obstructive sleep apnea) 05/17/2020  ? Osteoarthritis   ? Overweight 03/23/2020  ? PCP NOTES >>>>>>>>>>>>>>>> 05/26/2019  ? Personal history of other malignant neoplasm of skin 06/30/2012  ? Dr Wilhemina Bonito, Derm X3 Local resection from  right forearm and forehead.   Mohs surgery of the nose 06/2012, Dr Verlon Au of this note might be different from the original. Overview:  Dr Wilhemina Bonito, Derm X3 Local resection from  right forearm and forehead.   Mohs surgery of the nose 06/2012, Dr Sarajane Jews Overview:  Overview:  Dr Wilhemina Bonito, Derm X3 Local resection from  right forearm and fo  ? RLS (restless legs syndrome)   ? Sleep apnea   ? Snoring 02/01/2014  ? Formatting of this note might be different from the original. Last Assessment & Plan:  The patient has been noted to have significant snoring, but no one has ever really commented on an abnormal breathing pattern during sleep. Although he is overweight and does have restless sleep, the rest of his history is not overly  impressive for sleep disordered breathing. If he does have sleep apnea, I suspe  ? ? ?Past Surgical History:  ?Procedure Laterality Date  ? 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 INGUINAL HERNIA REPAIR  [age]  ? right,left

## 2021-11-19 NOTE — Patient Instructions (Signed)

## 2021-11-20 ENCOUNTER — Ambulatory Visit (INDEPENDENT_AMBULATORY_CARE_PROVIDER_SITE_OTHER): Payer: Medicare Other | Admitting: Psychology

## 2021-11-20 DIAGNOSIS — F4322 Adjustment disorder with anxiety: Secondary | ICD-10-CM

## 2021-11-20 NOTE — Progress Notes (Signed)
[facility] Counselor/Therapist Progress Note ? ?Patient ID: Gregory Wilkins, MRN: [id number],   ? ?Date: 11/20/2021 ? ?Time Spent: 9:00am - 9:50am   50 minutes  ? ?Treatment Type: Individual Therapy ? ?Reported Symptoms: stress ? ?Mental Status Exam: ?Appearance:  Casual     ?Behavior: Appropriate  ?Motor: Normal  ?Speech/Language:  Normal Rate  ?Affect: Appropriate  ?Mood: normal  ?Thought process: normal  ?Thought content:   WNL  ?Sensory/Perceptual disturbances:   WNL  ?Orientation: oriented to person, place, time/date, and situation  ?Attention: Good  ?Concentration: Good  ?Memory: WNL  ?Fund of knowledge:  Good  ?Insight:   Good  ?Judgment:  Good  ?Impulse Control: Good  ? ?Risk Assessment: ?Danger to Self:  No ?Self-injurious Behavior: No ?Danger to Others: No ?Duty to Warn:no ?Physical Aggression / Violence:No  ?Access to Firearms a concern: No  ?Gang Involvement:No  ? ?Subjective:  ?Pt present for face-to-face individual therapy via video Webex.  Pt consents to telehealth video session due to COVID 19 pandemic. ?Location of pt: home ?Location of therapist: home office.   ?Pt talked about his health.  He saw the cardiologist and he is doing well regarding heart health.   ?Pt talked about his son.  His son is going to see a substance abuse counselor.   That raises pt's hopes but he is cautious about it bc of his son's track record.  Pt is trying not to worry or obsess about concerns about his son. He states he is having fewer times when he is blaming himself.  Pt has a hard time not taking responsibility for things that are not in his control.   ?Pt talked about how a lot of his life has been about having something to prove.   Addressed the issues and helped pt process his feelings.  ?Provided supportive therapy.    ? ?Interventions: Cognitive Behavioral Therapy and Insight-Oriented ? ?Diagnosis: F43.22 ? ? ?Plan of Care: Pt participated in setting treatment goals.   He is progressing toward  goals.   Plan to continue to meet every two weeks.  ? ?Treatment Plan  (Treatment Plan Target Date 10/10/2022) ?Client Abilities/Strengths  ?Pt is bright, engaging and motivated for therapy.  ?Client Treatment Preferences  ?Individual therapy.  ?Client Statement of Needs  ?Improve coping skills.  ?Symptoms  ?Autonomic hyperactivity (e.g., palpitations, shortness of breath, dry mouth, trouble swallowing, nausea, diarrhea). Excessive and/or unrealistic worry that is difficult to control occurring more days than not for at least 6 months about a number of events or activities. Hypervigilance (e.g., feeling constantly on edge, experiencing concentration difficulties, having trouble falling or staying asleep, exhibiting a general state of irritability). Motor tension (e.g., restlessness, tiredness, shakiness, muscle tension). ?Problems Addressed  ?Anxiety ?Goals ?1. Enhance ability to effectively cope with the full variety of life's worries and anxieties. ?2. Learn and implement coping skills that result in a reduction of anxiety and worry, and improved daily functioning. ?Objective ?Learn to accept limitations in life and commit to tolerating, rather than avoiding, unpleasant emotions while accomplishing meaningful goals. ?Target Date: 2022-10-10 Frequency: Biweekly ?Progress: 50 Modality: individual ?Related Interventions ?1. Use techniques from Acceptance and Commitment Therapy to help client accept uncomfortable realities such as lack of complete control, imperfections, and uncertainty and tolerate unpleasant emotions and thoughts in order to accomplish value-consistent goals. ?Objective ?Learn and implement problem-solving strategies for realistically addressing worries. ?Target Date: 2022-10-10 Frequency: Biweekly ?Progress: 50 Modality: individual ?Related Interventions ?1. Assign the client a  homework exercise in which he/she problem-solves a current problem.  review, reinforce success, and provide corrective  feedback toward improvement. ?2. Teach the client problem-solving strategies involving specifically defining a problem, generating options for addressing it, evaluating the pros and cons of each option, selecting and implementing an optional action, and reevaluating and refining the action. ?Objective ?Learn and implement calming skills to reduce overall anxiety and manage anxiety symptoms. ?Target Date: 2022-10-10 Frequency: Biweekly ?Progress: 50 Modality: individual ?Related Interventions ?1. Assign the client to read about progressive muscle relaxation and other calming strategies in relevant books or treatment manuals (e.g., Progressive Relaxation Training by Gwynneth Aliment and Dani Gobble; Mastery of Your Anxiety and Worry: Workbook by Beckie Busing). ?2. Assign the client homework each session in which he/she practices relaxation exercises daily, gradually applying them progressively from non-anxiety-provoking to anxiety-provoking situations; review and reinforce success while providing corrective feedback toward improvement. ?3. Teach the client calming/relaxation skills (e.g., applied relaxation, progressive muscle relaxation, cue controlled relaxation; mindful breathing; biofeedback) and how to discriminate better between relaxation and tension; teach the client how to apply these skills to his/her daily life. ?3. Reduce overall frequency, intensity, and duration of the anxiety so that daily functioning is not impaired. ?4. Resolve the core conflict that is the source of anxiety. ?5. Stabilize anxiety level while increasing ability to function on a daily basis. ?Diagnosis :    F43.22 ?Conditions For Discharge ?Achievement of treatment goals and objectives. ? ?Argenis Kumari, LCSW ? ? ? ?

## 2021-11-25 ENCOUNTER — Ambulatory Visit (INDEPENDENT_AMBULATORY_CARE_PROVIDER_SITE_OTHER): Payer: Medicare Other | Admitting: Bariatrics

## 2021-12-04 ENCOUNTER — Ambulatory Visit (INDEPENDENT_AMBULATORY_CARE_PROVIDER_SITE_OTHER): Payer: Medicare Other | Admitting: Psychology

## 2021-12-04 DIAGNOSIS — F4322 Adjustment disorder with anxiety: Secondary | ICD-10-CM | POA: Diagnosis not present

## 2021-12-04 NOTE — Progress Notes (Signed)
[facility] Counselor/Therapist Progress Note ? ?Patient ID: Gregory Wilkins, MRN: [id number],   ? ?Date: 12/04/2021 ? ?Time Spent: 9:00am - 9:50am   50 minutes  ? ?Treatment Type: Individual Therapy ? ?Reported Symptoms: stress ? ?Mental Status Exam: ?Appearance:  Casual     ?Behavior: Appropriate  ?Motor: Normal  ?Speech/Language:  Normal Rate  ?Affect: Appropriate  ?Mood: normal  ?Thought process: normal  ?Thought content:   WNL  ?Sensory/Perceptual disturbances:   WNL  ?Orientation: oriented to person, place, time/date, and situation  ?Attention: Good  ?Concentration: Good  ?Memory: WNL  ?Fund of knowledge:  Good  ?Insight:   Good  ?Judgment:  Good  ?Impulse Control: Good  ? ?Risk Assessment: ?Danger to Self:  No ?Self-injurious Behavior: No ?Danger to Others: No ?Duty to Warn:no ?Physical Aggression / Violence:No  ?Access to Firearms a concern: No  ?Gang Involvement:No  ? ?Subjective:  ?Pt present for face-to-face individual therapy via video Webex.  Pt consents to telehealth video session due to COVID 19 pandemic. ?Location of pt: home ?Location of therapist: home office.   ?Pt talked about having a dermatology appointment this morning.  He had a spot on his face that could have been cancer so he had it removed.   ?Pt felt stressed bc he was not sure that he could make it to his therapy appointment.   Pt does not like being rushed and stressed.   He made it to his therapy appointment but felt stressed.   Helped pt process his feelings and decompress.   ?Pt talked about his family.   Pt's wife's brother and wife are coming to visit.   Pt states his wife's family has been estranged.  Addressed the family dynamics and how they impact pt.  Pt states his wife Butch Penny always wanted a family like pt's family.  Pt's parents embraced Butch Penny. ?Provided supportive therapy.    ? ?Interventions: Cognitive Behavioral Therapy and Insight-Oriented ? ?Diagnosis: F43.22 ? ? ?Plan of Care: Pt participated in  setting treatment goals.   He is progressing toward goals.   Plan to continue to meet every two weeks.  ? ?Treatment Plan  (Treatment Plan Target Date 10/10/2022) ?Client Abilities/Strengths  ?Pt is bright, engaging and motivated for therapy.  ?Client Treatment Preferences  ?Individual therapy.  ?Client Statement of Needs  ?Improve coping skills.  ?Symptoms  ?Autonomic hyperactivity (e.g., palpitations, shortness of breath, dry mouth, trouble swallowing, nausea, diarrhea). Excessive and/or unrealistic worry that is difficult to control occurring more days than not for at least 6 months about a number of events or activities. Hypervigilance (e.g., feeling constantly on edge, experiencing concentration difficulties, having trouble falling or staying asleep, exhibiting a general state of irritability). Motor tension (e.g., restlessness, tiredness, shakiness, muscle tension). ?Problems Addressed  ?Anxiety ?Goals ?1. Enhance ability to effectively cope with the full variety of life's worries and anxieties. ?2. Learn and implement coping skills that result in a reduction of anxiety and worry, and improved daily functioning. ?Objective ?Learn to accept limitations in life and commit to tolerating, rather than avoiding, unpleasant emotions while accomplishing meaningful goals. ?Target Date: 2022-10-10 Frequency: Biweekly ?Progress: 50 Modality: individual ?Related Interventions ?1. Use techniques from Acceptance and Commitment Therapy to help client accept uncomfortable realities such as lack of complete control, imperfections, and uncertainty and tolerate unpleasant emotions and thoughts in order to accomplish value-consistent goals. ?Objective ?Learn and implement problem-solving strategies for realistically addressing worries. ?Target Date: 2022-10-10 Frequency: Biweekly ?Progress: 50 Modality: individual ?Related  Interventions ?1. Assign the client a homework exercise in which he/she problem-solves a current problem.   review, reinforce success, and provide corrective feedback toward improvement. ?2. Teach the client problem-solving strategies involving specifically defining a problem, generating options for addressing it, evaluating the pros and cons of each option, selecting and implementing an optional action, and reevaluating and refining the action. ?Objective ?Learn and implement calming skills to reduce overall anxiety and manage anxiety symptoms. ?Target Date: 2022-10-10 Frequency: Biweekly ?Progress: 50 Modality: individual ?Related Interventions ?1. Assign the client to read about progressive muscle relaxation and other calming strategies in relevant books or treatment manuals (e.g., Progressive Relaxation Training by Gwynneth Aliment and Dani Gobble; Mastery of Your Anxiety and Worry: Workbook by Beckie Busing). ?2. Assign the client homework each session in which he/she practices relaxation exercises daily, gradually applying them progressively from non-anxiety-provoking to anxiety-provoking situations; review and reinforce success while providing corrective feedback toward improvement. ?3. Teach the client calming/relaxation skills (e.g., applied relaxation, progressive muscle relaxation, cue controlled relaxation; mindful breathing; biofeedback) and how to discriminate better between relaxation and tension; teach the client how to apply these skills to his/her daily life. ?3. Reduce overall frequency, intensity, and duration of the anxiety so that daily functioning is not impaired. ?4. Resolve the core conflict that is the source of anxiety. ?5. Stabilize anxiety level while increasing ability to function on a daily basis. ?Diagnosis :    F43.22 ?Conditions For Discharge ?Achievement of treatment goals and objectives. ? ?Judy Pollman, LCSW ? ? ? ?

## 2021-12-12 ENCOUNTER — Telehealth: Payer: Self-pay | Admitting: Internal Medicine

## 2021-12-12 DIAGNOSIS — G2581 Restless legs syndrome: Secondary | ICD-10-CM

## 2021-12-12 NOTE — Telephone Encounter (Signed)
Requesting: clonazepam 0.'5mg'$   Contract:08/28/20 UDS: 08/28/20 Last Visit:  08/27/21 Next Visit: 02/25/22 Last Refill: 07/26/2021 #90 and 0RF  Please Advise

## 2021-12-12 NOTE — Telephone Encounter (Signed)
PDMP okay, Rx sent 

## 2021-12-18 ENCOUNTER — Ambulatory Visit (INDEPENDENT_AMBULATORY_CARE_PROVIDER_SITE_OTHER): Payer: Medicare Other | Admitting: Psychology

## 2021-12-18 DIAGNOSIS — F4322 Adjustment disorder with anxiety: Secondary | ICD-10-CM

## 2021-12-18 NOTE — Progress Notes (Signed)
[facility] Counselor/Therapist Progress Note  Patient ID: TENNESSEE PERRA, MRN: [id number],    Date: 12/18/2021  Time Spent: 9:00am - 9:50am   50 minutes   Treatment Type: Individual Therapy  Reported Symptoms: stress  Mental Status Exam: Appearance:  Casual     Behavior: Appropriate  Motor: Normal  Speech/Language:  Normal Rate  Affect: Appropriate  Mood: normal  Thought process: normal  Thought content:   WNL  Sensory/Perceptual disturbances:   WNL  Orientation: oriented to person, place, time/date, and situation  Attention: Good  Concentration: Good  Memory: WNL  Fund of knowledge:  Good  Insight:   Good  Judgment:  Good  Impulse Control: Good   Risk Assessment: Danger to Self:  No Self-injurious Behavior: No Danger to Others: No Duty to Warn:no Physical Aggression / Violence:No  Access to Firearms a concern: No  Gang Involvement:No   Subjective:  Pt present for face-to-face individual therapy via video Webex.  Pt consents to telehealth video session due to COVID 19 pandemic. Location of pt: home Location of therapist: home office.   Pt talked about his son Matti who has an appointment with Mental Health this morning.    Pt's wife took him to the appointment bc pt is trying to taking better care of himself and having her share some of that responsibility.    Pt talked about Mother's Day.  Pt's wife [name]'s brother and sister were there and Butch Penny and her sister are starting to reconcile their relationship.   Pt is relieved that that conflict has been resolved.    Addressed pt's family dynamics.   Provided supportive therapy.     Interventions: Cognitive Behavioral Therapy and Insight-Oriented  Diagnosis: F43.22   Plan of Care: Pt participated in setting treatment goals.   He is progressing toward goals.   Plan to continue to meet every two weeks.   Treatment Plan  (Treatment Plan Target Date 10/10/2022) Client Abilities/Strengths  Pt is  bright, engaging and motivated for therapy.  Client Treatment Preferences  Individual therapy.  Client Statement of Needs  Improve coping skills.  Symptoms  Autonomic hyperactivity (e.g., palpitations, shortness of breath, dry mouth, trouble swallowing, nausea, diarrhea). Excessive and/or unrealistic worry that is difficult to control occurring more days than not for at least 6 months about a number of events or activities. Hypervigilance (e.g., feeling constantly on edge, experiencing concentration difficulties, having trouble falling or staying asleep, exhibiting a general state of irritability). Motor tension (e.g., restlessness, tiredness, shakiness, muscle tension). Problems Addressed  Anxiety Goals 1. Enhance ability to effectively cope with the full variety of life's worries and anxieties. 2. Learn and implement coping skills that result in a reduction of anxiety and worry, and improved daily functioning. Objective Learn to accept limitations in life and commit to tolerating, rather than avoiding, unpleasant emotions while accomplishing meaningful goals. Target Date: 2022-10-10 Frequency: Biweekly Progress: 50 Modality: individual Related Interventions 1. Use techniques from Acceptance and Commitment Therapy to help client accept uncomfortable realities such as lack of complete control, imperfections, and uncertainty and tolerate unpleasant emotions and thoughts in order to accomplish value-consistent goals. Objective Learn and implement problem-solving strategies for realistically addressing worries. Target Date: 2022-10-10 Frequency: Biweekly Progress: 50 Modality: individual Related Interventions 1. Assign the client a homework exercise in which he/she problem-solves a current problem.  review, reinforce success, and provide corrective feedback toward improvement. 2. Teach the client problem-solving strategies involving specifically defining a problem, generating options for  addressing it,  evaluating the pros and cons of each option, selecting and implementing an optional action, and reevaluating and refining the action. Objective Learn and implement calming skills to reduce overall anxiety and manage anxiety symptoms. Target Date: 2022-10-10 Frequency: Biweekly Progress: 50 Modality: individual Related Interventions 1. Assign the client to read about progressive muscle relaxation and other calming strategies in relevant books or treatment manuals (e.g., Progressive Relaxation Training by Gwynneth Aliment and Dani Gobble; Mastery of Your Anxiety and Worry: Workbook by Beckie Busing). 2. Assign the client homework each session in which he/she practices relaxation exercises daily, gradually applying them progressively from non-anxiety-provoking to anxiety-provoking situations; review and reinforce success while providing corrective feedback toward improvement. 3. Teach the client calming/relaxation skills (e.g., applied relaxation, progressive muscle relaxation, cue controlled relaxation; mindful breathing; biofeedback) and how to discriminate better between relaxation and tension; teach the client how to apply these skills to his/her daily life. 3. Reduce overall frequency, intensity, and duration of the anxiety so that daily functioning is not impaired. 4. Resolve the core conflict that is the source of anxiety. 5. Stabilize anxiety level while increasing ability to function on a daily basis. Diagnosis :    F43.22 Conditions For Discharge Achievement of treatment goals and objectives.  Giovoni Bunch, LCSW

## 2022-01-01 ENCOUNTER — Ambulatory Visit (INDEPENDENT_AMBULATORY_CARE_PROVIDER_SITE_OTHER): Payer: Medicare Other | Admitting: Psychology

## 2022-01-01 DIAGNOSIS — F4322 Adjustment disorder with anxiety: Secondary | ICD-10-CM

## 2022-01-01 NOTE — Progress Notes (Signed)
[facility] Counselor/Therapist Progress Note  Patient ID: DASHIEL BERGQUIST, MRN: [id number],    Date: 01/01/2022  Time Spent: 9:00am - 9:50am   50 minutes   Treatment Type: Individual Therapy  Reported Symptoms: stress  Mental Status Exam: Appearance:  Casual     Behavior: Appropriate  Motor: Normal  Speech/Language:  Normal Rate  Affect: Appropriate  Mood: normal  Thought process: normal  Thought content:   WNL  Sensory/Perceptual disturbances:   WNL  Orientation: oriented to person, place, time/date, and situation  Attention: Good  Concentration: Good  Memory: WNL  Fund of knowledge:  Good  Insight:   Good  Judgment:  Good  Impulse Control: Good   Risk Assessment: Danger to Self:  No Self-injurious Behavior: No Danger to Others: No Duty to Warn:no Physical Aggression / Violence:No  Access to Firearms a concern: No  Gang Involvement:No   Subjective:  Pt present for face-to-face individual therapy via video Webex.  Pt consents to telehealth video session due to COVID 19 pandemic. Location of pt: home Location of therapist: home office.   Pt talked about his son.   Pt states his son makes the same promises that he does not keep.  Pt states his son does not want to get better.  Addressed pt's frustrations with his son.   Pt's son has been at his house.  Pt does not feel like playing music when his son is there bc music is joy and pt does not feel joy when his son is home.   Addressed pt's family dynamics.   Pt and wife are thinking about getting a dog.  His wife wants a little dog but pt would prefer a big dog.  They may get a dog in a couple of weeks.  Addressed how this would be good for pt.   Provided supportive therapy.     Interventions: Cognitive Behavioral Therapy and Insight-Oriented  Diagnosis: F43.22   Plan of Care: Pt participated in setting treatment goals.   He is progressing toward goals.   Plan to continue to meet every two weeks.    Treatment Plan  (Treatment Plan Target Date 10/10/2022) Client Abilities/Strengths  Pt is bright, engaging and motivated for therapy.  Client Treatment Preferences  Individual therapy.  Client Statement of Needs  Improve coping skills.  Symptoms  Autonomic hyperactivity (e.g., palpitations, shortness of breath, dry mouth, trouble swallowing, nausea, diarrhea). Excessive and/or unrealistic worry that is difficult to control occurring more days than not for at least 6 months about a number of events or activities. Hypervigilance (e.g., feeling constantly on edge, experiencing concentration difficulties, having trouble falling or staying asleep, exhibiting a general state of irritability). Motor tension (e.g., restlessness, tiredness, shakiness, muscle tension). Problems Addressed  Anxiety Goals 1. Enhance ability to effectively cope with the full variety of life's worries and anxieties. 2. Learn and implement coping skills that result in a reduction of anxiety and worry, and improved daily functioning. Objective Learn to accept limitations in life and commit to tolerating, rather than avoiding, unpleasant emotions while accomplishing meaningful goals. Target Date: 2022-10-10 Frequency: Biweekly Progress: 50 Modality: individual Related Interventions 1. Use techniques from Acceptance and Commitment Therapy to help client accept uncomfortable realities such as lack of complete control, imperfections, and uncertainty and tolerate unpleasant emotions and thoughts in order to accomplish value-consistent goals. Objective Learn and implement problem-solving strategies for realistically addressing worries. Target Date: 2022-10-10 Frequency: Biweekly Progress: 50 Modality: individual Related Interventions 1. Assign the client  a homework exercise in which he/she problem-solves a current problem.  review, reinforce success, and provide corrective feedback toward improvement. 2. Teach the client  problem-solving strategies involving specifically defining a problem, generating options for addressing it, evaluating the pros and cons of each option, selecting and implementing an optional action, and reevaluating and refining the action. Objective Learn and implement calming skills to reduce overall anxiety and manage anxiety symptoms. Target Date: 2022-10-10 Frequency: Biweekly Progress: 50 Modality: individual Related Interventions 1. Assign the client to read about progressive muscle relaxation and other calming strategies in relevant books or treatment manuals (e.g., Progressive Relaxation Training by Gwynneth Aliment and Dani Gobble; Mastery of Your Anxiety and Worry: Workbook by Beckie Busing). 2. Assign the client homework each session in which he/she practices relaxation exercises daily, gradually applying them progressively from non-anxiety-provoking to anxiety-provoking situations; review and reinforce success while providing corrective feedback toward improvement. 3. Teach the client calming/relaxation skills (e.g., applied relaxation, progressive muscle relaxation, cue controlled relaxation; mindful breathing; biofeedback) and how to discriminate better between relaxation and tension; teach the client how to apply these skills to his/her daily life. 3. Reduce overall frequency, intensity, and duration of the anxiety so that daily functioning is not impaired. 4. Resolve the core conflict that is the source of anxiety. 5. Stabilize anxiety level while increasing ability to function on a daily basis. Diagnosis :    F43.22 Conditions For Discharge Achievement of treatment goals and objectives.  Lamoine Fredricksen, LCSW

## 2022-01-15 ENCOUNTER — Ambulatory Visit (INDEPENDENT_AMBULATORY_CARE_PROVIDER_SITE_OTHER): Payer: Medicare Other | Admitting: Psychology

## 2022-01-15 DIAGNOSIS — F4322 Adjustment disorder with anxiety: Secondary | ICD-10-CM

## 2022-01-15 NOTE — Progress Notes (Signed)
[facility] Counselor/Therapist Progress Note  Patient ID: Gregory Wilkins, MRN: [id number],    Date: 01/15/2022  Time Spent: 9:00am - 9:50am   50 minutes   Treatment Type: Individual Therapy  Reported Symptoms: stress  Mental Status Exam: Appearance:  Casual     Behavior: Appropriate  Motor: Normal  Speech/Language:  Normal Rate  Affect: Appropriate  Mood: normal  Thought process: normal  Thought content:   WNL  Sensory/Perceptual disturbances:   WNL  Orientation: oriented to person, place, time/date, and situation  Attention: Good  Concentration: Good  Memory: WNL  Fund of knowledge:  Good  Insight:   Good  Judgment:  Good  Impulse Control: Good   Risk Assessment: Danger to Self:  No Self-injurious Behavior: No Danger to Others: No Duty to Warn:no Physical Aggression / Violence:No  Access to Firearms a concern: No  Gang Involvement:No   Subjective:  Pt present for face-to-face individual therapy via video Webex.  Pt consents to telehealth video session due to COVID 19 pandemic. Location of pt: home Location of therapist: home office.   Pt talked about his wife being out of town for 2 weeks.   His wife is vacationing with friends in [place].  Pt has been enjoying the two weeks without his wife bc of the decrease in conflict.   Pt talked about his son.  Pt thinks his son is using opiates and meth.   Pt thinks about what a waste of a life his son's addiction has created.  His son was an Chief Financial Officer in high school and now can not hold down any job.  Helped pt process his feelings.   Provided supportive therapy.     Interventions: Cognitive Behavioral Therapy and Insight-Oriented  Diagnosis: F43.22   Plan of Care: Pt participated in setting treatment goals.   He is progressing toward goals.   Plan to continue to meet every two weeks.   Treatment Plan  (Treatment Plan Target Date 10/10/2022) Client Abilities/Strengths  Pt is bright, engaging and  motivated for therapy.  Client Treatment Preferences  Individual therapy.  Client Statement of Needs  Improve coping skills.  Symptoms  Autonomic hyperactivity (e.g., palpitations, shortness of breath, dry mouth, trouble swallowing, nausea, diarrhea). Excessive and/or unrealistic worry that is difficult to control occurring more days than not for at least 6 months about a number of events or activities. Hypervigilance (e.g., feeling constantly on edge, experiencing concentration difficulties, having trouble falling or staying asleep, exhibiting a general state of irritability). Motor tension (e.g., restlessness, tiredness, shakiness, muscle tension). Problems Addressed  Anxiety Goals 1. Enhance ability to effectively cope with the full variety of life's worries and anxieties. 2. Learn and implement coping skills that result in a reduction of anxiety and worry, and improved daily functioning. Objective Learn to accept limitations in life and commit to tolerating, rather than avoiding, unpleasant emotions while accomplishing meaningful goals. Target Date: 2022-10-10 Frequency: Biweekly Progress: 50 Modality: individual Related Interventions 1. Use techniques from Acceptance and Commitment Therapy to help client accept uncomfortable realities such as lack of complete control, imperfections, and uncertainty and tolerate unpleasant emotions and thoughts in order to accomplish value-consistent goals. Objective Learn and implement problem-solving strategies for realistically addressing worries. Target Date: 2022-10-10 Frequency: Biweekly Progress: 50 Modality: individual Related Interventions 1. Assign the client a homework exercise in which he/she problem-solves a current problem.  review, reinforce success, and provide corrective feedback toward improvement. 2. Teach the client problem-solving strategies involving specifically defining a  problem, generating options for addressing it, evaluating  the pros and cons of each option, selecting and implementing an optional action, and reevaluating and refining the action. Objective Learn and implement calming skills to reduce overall anxiety and manage anxiety symptoms. Target Date: 2022-10-10 Frequency: Biweekly Progress: 50 Modality: individual Related Interventions 1. Assign the client to read about progressive muscle relaxation and other calming strategies in relevant books or treatment manuals (e.g., Progressive Relaxation Training by Gwynneth Aliment and Dani Gobble; Mastery of Your Anxiety and Worry: Workbook by Beckie Busing). 2. Assign the client homework each session in which he/she practices relaxation exercises daily, gradually applying them progressively from non-anxiety-provoking to anxiety-provoking situations; review and reinforce success while providing corrective feedback toward improvement. 3. Teach the client calming/relaxation skills (e.g., applied relaxation, progressive muscle relaxation, cue controlled relaxation; mindful breathing; biofeedback) and how to discriminate better between relaxation and tension; teach the client how to apply these skills to his/her daily life. 3. Reduce overall frequency, intensity, and duration of the anxiety so that daily functioning is not impaired. 4. Resolve the core conflict that is the source of anxiety. 5. Stabilize anxiety level while increasing ability to function on a daily basis. Diagnosis :    F43.22 Conditions For Discharge Achievement of treatment goals and objectives.  Velencia Lenart, LCSW

## 2022-01-29 ENCOUNTER — Ambulatory Visit (INDEPENDENT_AMBULATORY_CARE_PROVIDER_SITE_OTHER): Payer: Medicare Other | Admitting: Psychology

## 2022-01-29 DIAGNOSIS — F4322 Adjustment disorder with anxiety: Secondary | ICD-10-CM

## 2022-01-29 NOTE — Progress Notes (Signed)
[facility] Counselor/Therapist Progress Note  Patient ID: Gregory Wilkins, MRN: [id number],    Date: 01/29/2022  Time Spent: 9:00am - 9:50am   50 minutes   Treatment Type: Individual Therapy  Reported Symptoms: stress  Mental Status Exam: Appearance:  Casual     Behavior: Appropriate  Motor: Normal  Speech/Language:  Normal Rate  Affect: Appropriate  Mood: normal  Thought process: normal  Thought content:   WNL  Sensory/Perceptual disturbances:   WNL  Orientation: oriented to person, place, time/date, and situation  Attention: Good  Concentration: Good  Memory: WNL  Fund of knowledge:  Good  Insight:   Good  Judgment:  Good  Impulse Control: Good   Risk Assessment: Danger to Self:  No Self-injurious Behavior: No Danger to Others: No Duty to Warn:no Physical Aggression / Violence:No  Access to Firearms a concern: No  Gang Involvement:No   Subjective:  Pt present for face-to-face individual therapy via video Webex.  Pt consents to telehealth video session due to COVID 19 pandemic. Location of pt: home Location of therapist: home office.   Pt talked about having some periods of melancholy.   He did some writing during that time and was self reflective.   He has been contemplating on regrets and self forgiveness.  Helped pt process the issues and his feelings.  Pt talked about his relationship with his wife.  She is back from her vacation and came home and entered right back into her negative dynamics of being critical and controlling.   Provided supportive therapy.     Interventions: Cognitive Behavioral Therapy and Insight-Oriented  Diagnosis: F43.22   Plan of Care: Pt participated in setting treatment goals.   He is progressing toward goals.   Plan to continue to meet every two weeks.   Treatment Plan  (Treatment Plan Target Date 10/10/2022) Client Abilities/Strengths  Pt is bright, engaging and motivated for therapy.  Client Treatment  Preferences  Individual therapy.  Client Statement of Needs  Improve coping skills.  Symptoms  Autonomic hyperactivity (e.g., palpitations, shortness of breath, dry mouth, trouble swallowing, nausea, diarrhea). Excessive and/or unrealistic worry that is difficult to control occurring more days than not for at least 6 months about a number of events or activities. Hypervigilance (e.g., feeling constantly on edge, experiencing concentration difficulties, having trouble falling or staying asleep, exhibiting a general state of irritability). Motor tension (e.g., restlessness, tiredness, shakiness, muscle tension). Problems Addressed  Anxiety Goals 1. Enhance ability to effectively cope with the full variety of life's worries and anxieties. 2. Learn and implement coping skills that result in a reduction of anxiety and worry, and improved daily functioning. Objective Learn to accept limitations in life and commit to tolerating, rather than avoiding, unpleasant emotions while accomplishing meaningful goals. Target Date: 2022-10-10 Frequency: Biweekly Progress: 50 Modality: individual Related Interventions 1. Use techniques from Acceptance and Commitment Therapy to help client accept uncomfortable realities such as lack of complete control, imperfections, and uncertainty and tolerate unpleasant emotions and thoughts in order to accomplish value-consistent goals. Objective Learn and implement problem-solving strategies for realistically addressing worries. Target Date: 2022-10-10 Frequency: Biweekly Progress: 50 Modality: individual Related Interventions 1. Assign the client a homework exercise in which he/she problem-solves a current problem.  review, reinforce success, and provide corrective feedback toward improvement. 2. Teach the client problem-solving strategies involving specifically defining a problem, generating options for addressing it, evaluating the pros and cons of each option, selecting  and implementing an optional action, and reevaluating  and refining the action. Objective Learn and implement calming skills to reduce overall anxiety and manage anxiety symptoms. Target Date: 2022-10-10 Frequency: Biweekly Progress: 50 Modality: individual Related Interventions 1. Assign the client to read about progressive muscle relaxation and other calming strategies in relevant books or treatment manuals (e.g., Progressive Relaxation Training by Gwynneth Aliment and Dani Gobble; Mastery of Your Anxiety and Worry: Workbook by Beckie Busing). 2. Assign the client homework each session in which he/she practices relaxation exercises daily, gradually applying them progressively from non-anxiety-provoking to anxiety-provoking situations; review and reinforce success while providing corrective feedback toward improvement. 3. Teach the client calming/relaxation skills (e.g., applied relaxation, progressive muscle relaxation, cue controlled relaxation; mindful breathing; biofeedback) and how to discriminate better between relaxation and tension; teach the client how to apply these skills to his/her daily life. 3. Reduce overall frequency, intensity, and duration of the anxiety so that daily functioning is not impaired. 4. Resolve the core conflict that is the source of anxiety. 5. Stabilize anxiety level while increasing ability to function on a daily basis. Diagnosis :    F43.22 Conditions For Discharge Achievement of treatment goals and objectives.  Pershing Skidmore, LCSW

## 2022-01-30 ENCOUNTER — Ambulatory Visit: Payer: Medicare Other | Attending: Internal Medicine

## 2022-01-30 ENCOUNTER — Other Ambulatory Visit (HOSPITAL_BASED_OUTPATIENT_CLINIC_OR_DEPARTMENT_OTHER): Payer: Self-pay

## 2022-01-30 DIAGNOSIS — Z23 Encounter for immunization: Secondary | ICD-10-CM

## 2022-02-05 ENCOUNTER — Other Ambulatory Visit: Payer: Self-pay | Admitting: Internal Medicine

## 2022-02-11 ENCOUNTER — Other Ambulatory Visit (HOSPITAL_BASED_OUTPATIENT_CLINIC_OR_DEPARTMENT_OTHER): Payer: Self-pay

## 2022-02-11 MED ORDER — PFIZER COVID-19 VAC BIVALENT 30 MCG/0.3ML IM SUSP
INTRAMUSCULAR | 0 refills | Status: DC
  Filled 2022-02-11: qty 0.3, 1d supply, fill #0

## 2022-02-12 ENCOUNTER — Ambulatory Visit: Payer: Medicare Other | Admitting: Psychology

## 2022-02-18 ENCOUNTER — Other Ambulatory Visit: Payer: Self-pay | Admitting: Cardiology

## 2022-02-18 ENCOUNTER — Other Ambulatory Visit: Payer: Self-pay | Admitting: Internal Medicine

## 2022-02-25 ENCOUNTER — Encounter: Payer: Self-pay | Admitting: Internal Medicine

## 2022-02-25 ENCOUNTER — Ambulatory Visit (INDEPENDENT_AMBULATORY_CARE_PROVIDER_SITE_OTHER): Payer: Medicare Other | Admitting: Internal Medicine

## 2022-02-25 VITALS — BP 116/60 | HR 55 | Temp 98.0°F | Resp 16 | Ht 69.0 in | Wt 199.1 lb

## 2022-02-25 DIAGNOSIS — K7460 Unspecified cirrhosis of liver: Secondary | ICD-10-CM | POA: Diagnosis not present

## 2022-02-25 DIAGNOSIS — I2510 Atherosclerotic heart disease of native coronary artery without angina pectoris: Secondary | ICD-10-CM

## 2022-02-25 DIAGNOSIS — I2584 Coronary atherosclerosis due to calcified coronary lesion: Secondary | ICD-10-CM

## 2022-02-25 DIAGNOSIS — R739 Hyperglycemia, unspecified: Secondary | ICD-10-CM | POA: Diagnosis not present

## 2022-02-25 DIAGNOSIS — E782 Mixed hyperlipidemia: Secondary | ICD-10-CM | POA: Diagnosis not present

## 2022-02-25 DIAGNOSIS — E559 Vitamin D deficiency, unspecified: Secondary | ICD-10-CM | POA: Diagnosis not present

## 2022-02-25 DIAGNOSIS — I10 Essential (primary) hypertension: Secondary | ICD-10-CM

## 2022-02-25 DIAGNOSIS — N400 Enlarged prostate without lower urinary tract symptoms: Secondary | ICD-10-CM | POA: Diagnosis not present

## 2022-02-25 LAB — COMPREHENSIVE METABOLIC PANEL
ALT: 15 U/L (ref 0–53)
AST: 20 U/L (ref 0–37)
Albumin: 4.4 g/dL (ref 3.5–5.2)
Alkaline Phosphatase: 72 U/L (ref 39–117)
BUN: 16 mg/dL (ref 6–23)
CO2: 32 mEq/L (ref 19–32)
Calcium: 9.6 mg/dL (ref 8.4–10.5)
Chloride: 103 mEq/L (ref 96–112)
Creatinine, Ser: 1.09 mg/dL (ref 0.40–1.50)
GFR: 64.74 mL/min (ref >60.00)
Glucose, Bld: 86 mg/dL (ref 70–99)
Potassium: 4.1 mEq/L (ref 3.5–5.1)
Sodium: 140 mEq/L (ref 135–145)
Total Bilirubin: 0.7 mg/dL (ref 0.2–1.2)
Total Protein: 7 g/dL (ref 6.0–8.3)

## 2022-02-25 LAB — CBC WITH DIFFERENTIAL/PLATELET
Basophils Absolute: 0 10*3/uL (ref 0.0–0.1)
Basophils Relative: 0.9 % (ref 0.0–3.0)
Eosinophils Absolute: 0.4 10*3/uL (ref 0.0–0.7)
Eosinophils Relative: 7.5 % — ABNORMAL HIGH (ref 0.0–5.0)
HCT: 43.7 % (ref 39.0–52.0)
Hemoglobin: 14.6 g/dL (ref 13.0–17.0)
Lymphocytes Relative: 27 % (ref 12.0–46.0)
Lymphs Abs: 1.3 10*3/uL (ref 0.7–4.0)
MCHC: 33.5 g/dL (ref 30.0–36.0)
MCV: 89.9 fl (ref 78.0–100.0)
Monocytes Absolute: 0.6 10*3/uL (ref 0.1–1.0)
Monocytes Relative: 13.1 % — ABNORMAL HIGH (ref 3.0–12.0)
Neutro Abs: 2.5 10*3/uL (ref 1.4–7.7)
Neutrophils Relative %: 51.5 % (ref 43.0–77.0)
Platelets: 189 10*3/uL (ref 150.0–400.0)
RBC: 4.86 Mil/uL (ref 4.22–5.81)
RDW: 13.2 % (ref 11.5–15.5)
WBC: 4.9 10*3/uL (ref 4.0–10.5)

## 2022-02-25 LAB — HEMOGLOBIN A1C: Hgb A1c MFr Bld: 5.5 % (ref 4.6–6.5)

## 2022-02-25 LAB — PROTIME-INR
INR: 1.4 ratio — ABNORMAL HIGH (ref 0.8–1.0)
Prothrombin Time: 14.9 s — ABNORMAL HIGH (ref 9.6–13.1)

## 2022-02-25 LAB — APTT: aPTT: 40.1 s — ABNORMAL HIGH (ref 25.4–36.8)

## 2022-02-25 LAB — VITAMIN D 25 HYDROXY (VIT D DEFICIENCY, FRACTURES): VITD: 31.08 ng/mL (ref 30.00–100.00)

## 2022-02-25 LAB — TSH: TSH: 1.86 u[IU]/mL (ref 0.35–5.50)

## 2022-02-25 LAB — PSA: PSA: 2.28 ng/mL (ref 0.10–4.00)

## 2022-02-25 NOTE — Patient Instructions (Addendum)
Please drop off or mail [place] a copy of your Healthcare Power of Attorney for your chart.  Proceed with a flu shot this fall   Check the  blood pressure regularly and heart rate regularly BP GOAL is between 110/65 and  135/85. If it is consistently higher or lower, let me know   GO TO THE LAB : Get the blood work     [employer], [place] back for   a checkup in 6 months

## 2022-02-25 NOTE — Assessment & Plan Note (Signed)
Tdap 2022  Covid vax : utd PNM :  utd S/p shingrix x 2  Flu shot rec  Prostate ca screening: last PSA 2021 wnl, likes PSA check, no symptoms.   CCS: C-scope 2014: Polyps 2015: No polyps 03-21-2019: no cscope report found but path report -- Tubular adenoma.  Next per GI if patient interested. H- POA: Recommend to bring a copy

## 2022-02-25 NOTE — Assessment & Plan Note (Signed)
HTN: Ambulatory BPs consistently in the 120/70, continue HCTZ, losartan. High cholesterol: Well-controlled Lipitor Paroxysmal A-fib, coronary calcifications: Saw cardiology 11/19/2021, felt to be stable. No chest pain.  Does report bradycardia at night.  Metoprolol is in his medication list but takes prn  only. Bradycardia was discussed with cardiology, no need for further evaluation at this point because he is asymptomatic Obesity: Seen at the weight management clinic 10/21/2021.  Does not plan to go back to them.  Doing well with diet, he remains extremely active. Cirrhosis: History of per CT, will check a PT PTT Vitamin D deficienc: Had ergocalciferol, currently not on supplements.  Check levels, encourage OTC vitamin D3 2000 units Preventive care reviewed RTC 6 months

## 2022-02-25 NOTE — Progress Notes (Signed)
Subjective:    Patient ID: Gregory Wilkins, male    DOB: June 19, 1941, 81 y.o.   MRN: [id number]  DOS:  02/25/2022 Type of visit - description: f/u  Since the last office visit is doing well. He remains active. Saw cardiology and the wellness clinic, notes reviewed.  He has a wrist watch, heart rate typically in the 50s however at night it registers heart rates in the 38-40 range, he is asymptomatic.  Denies any nightmares or headaches During the daytime heart rate is in the 50s.  No chest pain no difficulty breathing No edema No nausea vomiting no blood in the stools No cough No LUTS   Wt Readings from Last 3 Encounters:  02/25/22 199 lb 2 oz (90.3 kg)  11/19/21 196 lb 1.9 oz (89 kg)  10/21/21 194 lb (88 kg)     Review of Systems See above   Past Medical History:  Diagnosis Date   Acne rosacea 12/23/2006   Qualifier: Diagnosis of  By: Linna Darner MD, Gwyndolyn Saxon   Dr Crista Luria   Formatting of this note might be different from the original. Overview:  Qualifier: Diagnosis of  By: Linna Darner MD, Gwyndolyn Saxon   Dr Crista Luria Overview:  Overview:  Qualifier: Diagnosis of  By: Linna Darner MD, Gwyndolyn Saxon   Dr Crista Luria  Overview:  Qualifier: Diagnosis of  By: Linna Darner MD, Gwyndolyn Saxon   Dr Crista Luria Overview:  Overview:  Overview:   Acute meniscal tear, lateral 10/01/2021   Alcohol abuse    Allergic rhinitis 12/23/2006   Qualifier: Diagnosis of  By: Linna Darner MD, Gwyndolyn Saxon   Onset:in high school Character: perennial but increased seasonally Triggers :cat, dust, pollen Allergy testing: Dr Bernita Buffy Maintenance medications/ response:Flonase/ Nasonex; saline wash; Allegra Smoking history:age 16- 77, up to 2 ppd Family history pulmonary disease: no    Formatting of this note might be different from the original. 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 Overview:  Qualifier: Diagnosis of  By: Linna Darner MD, Susann Givens of this note might be different from the original. Overview:  Qualifier: Diagnosis of  By: Linna Darner MD, William  IMO 10/01 Updates   Enlarged prostate without lower urinary tract symptoms (luts) 11/09/2008   Formatting of this note might be different from the original. Overview:  Qualifier: Diagnosis of  By: Linna Darner MD, Gwyndolyn Saxon   No PMH of elevated PSA ; no biopsy Overview:  Overview:  Qualifier: Diagnosis of  By: Linna Darner MD, Gwyndolyn Saxon   No PMH of elevated PSA ; no biopsy Overview:  Overview:  Overview:  Qualifier: Diagnosis of  By: Linna Darner MD, William   No PMH of elevated PSA ; no  biopsy   Essential (primary) hypertension 09/21/2007   Qualifier: Diagnosis of  By: Linna Darner MD, Susann Givens of this note might be different from the original. Overview:  Qualifier: Diagnosis of  By: Linna Darner MD, [place]:  Blood pressure appears to be well controlled. Renal function will be checked.  He has a minor cough; this does not appear to be significant enough to warrant change to losartan Overview:  Overview:  Qu   Family history of type C viral hepatitis 06/30/2012   Brother had liver transplant    Hepatitis A 04/2019   History of colonic polyps 12/23/2006   Qualifier: Diagnosis of  By: Linna Darner MD, Gwyndolyn Saxon   Dr Earlie Raveling, GI Polypectomy X2 , 4/16 /2014. One was   transitional cell adenoma Due annually No FH colon cancer   Formatting of this note might be different from the original. Overview:  Qualifier: Diagnosis of  By: Linna Darner MD, Gwyndolyn Saxon   Dr Earlie Raveling, GI Polypectomy X2 , 4/16 /2014. One was  transitional cell adenoma Due annually No FH colon ca   History of urinary stone 09/21/2007   Qualifier: Diagnosis of  By: Linna Darner MD, Jessie Foot , passed spontaneously No Urologist involvement   Formatting of this note might be different from the original. Overview:  Qualifier: Diagnosis of  By: Linna Darner MD, Jessie Foot , passed spontaneously No Urologist involvement Overview:  Overview:  Qualifier: Diagnosis of  By: Linna Darner MD, Jessie Foot , passed spontaneously No Urologist involvement    HYPERLIPIDEMIA 09/21/2007   Qualifier: Diagnosis of  By: Linna Darner MD, Cristopher Estimable Lipoprofile 2006 : LDL  108 ( 1072  / 575  ),  HDL 63 , Triglycerides  40. LDL goal = < 135 ; ideally < 105. No FH CAD   Formatting of this note might be different from the original. Overview:  Qualifier: Diagnosis of  By: Linna Darner MD, Cristopher Estimable Lipoprofile 2006 : LDL  108 ( 1072  / 575  ),  HDL 63 , Triglycerides  40. LDL goal = < 135 ; idea   Hypertension    Joint pain    Liver injury, initial encounter 05/17/2019   Macular degeneration 01/06/2018   Malignant neoplasm of skin    Mallet finger 2012     4th L DIP,Dr Sypher   Mixed dyslipidemia 03/23/2020   Nephrolithiasis    Nocturnal hypoxia 11/04/2019   Nuclear sclerotic cataract of both eyes    Obesity (BMI 30-39.9) 01/04/2014   OSA (obstructive sleep apnea) 05/17/2020   Osteoarthritis    Overweight 03/23/2020   PCP NOTES >>>>>>>>>>>>>>>> 05/26/2019   Personal history of other malignant neoplasm of skin 06/30/2012   Dr Wilhemina Bonito, Derm X3 Local resection from  right forearm and forehead.   Mohs surgery of the nose 06/2012, Dr Verlon Au of this note might be different from the original. Overview:  Dr Wilhemina Bonito, Derm X3 Local  resection from  right forearm and forehead.   Mohs surgery of the nose 06/2012, Dr Sarajane Jews Overview:  Overview:  Dr Wilhemina Bonito, Derm X3 Local resection from  right forearm and fo   RLS (restless legs syndrome)    Sleep apnea    Snoring 02/01/2014   Formatting of this note might be different from the original. Last Assessment & Plan:  The patient has been noted to have significant snoring, but no one has ever really  commented on an abnormal breathing pattern during sleep. Although he is overweight and does have restless sleep, the rest of his history is not overly impressive for sleep disordered breathing. If he does have sleep apnea, I suspe    Past Surgical History:  Procedure Laterality Date   basal cell cancer     X 3   INGUINAL HERNIA REPAIR  [age]   right,left   OPEN REDUCTION INTERNAL FIXATION (ORIF) METACARPAL Right 05/19/2013   Procedure: OPEN REDUCTION INTERNAL FIXATION  RIGHT SMALL  METACARPAL FRACTURE;  Surgeon: Cammie Sickle., MD;  Location: [facility];  Service: Orthopedics;  Laterality: Right;   POLYPECTOMY     X 3; Dr Earlean Shawl   WISDOM TOOTH EXTRACTION      Current Outpatient Medications  Medication Instructions   aspirin EC 81 mg, Oral, Daily, Swallow whole.   atorvastatin (LIPITOR) 10 MG tablet TAKE 1 TABLET BY MOUTH EVERYDAY AT BEDTIME   cetirizine (ZYRTEC) 10 mg, Oral, Daily   Cholecalciferol (VITAMIN D3) 50 MCG (2000 UT) TABS Oral   clonazePAM (KLONOPIN) 0.5 MG tablet TAKE 1 TABLET BY MOUTH EVERY NIGHT AT BEDTIME AS NEEDED FOR RESTLESS LEG   ELIQUIS 5 MG TABS tablet TAKE 1 TABLET BY MOUTH TWICE A DAY   hydrochlorothiazide (HYDRODIURIL) 12.5 MG tablet TAKE 1 TABLET BY MOUTH EVERY DAY   losartan (COZAAR) 25 mg, Oral, Daily   metoprolol tartrate (LOPRESSOR) 12.5 mg, As needed   Multiple Vitamin (MULTIVITAMIN WITH MINERALS) TABS tablet 1 tablet, Oral, Daily   Multiple Vitamins-Minerals (ICAPS AREDS 2 PO) 1 tablet, Oral, Daily       Objective:    Physical Exam BP 116/60   Pulse (!) 55   Temp 98 F (36.7 C) (Oral)   Resp 16   Ht '5\' 9"'$  (1.753 m)   Wt 199 lb 2 oz (90.3 kg)   SpO2 96%   BMI 29.41 kg/m  General: Well developed, NAD, BMI noted Neck: No  thyromegaly  HEENT:  Normocephalic . Face symmetric, atraumatic Lungs:  CTA B Normal respiratory effort, no intercostal retractions, no accessory muscle use. Heart: Bradycardic Abdomen:  Not distended, soft, non-tender. No rebound or rigidity.   Lower extremities: no pretibial edema bilaterally  Skin: Exposed areas without rash. Not pale. Not jaundice Neurologic:  alert & oriented X3.  Speech normal, gait appropriate for age and unassisted Strength symmetric and appropriate for age.  Psych: Cognition and judgment appear intact.  Cooperative with normal attention span and concentration.  Behavior appropriate. No anxious or depressed appearing.     Assessment     Assessment (new patient 05/17/2019) HTN High Cholesterol Restless leg (clonazepam prn) Parox. A. Flutter DX 02-2020 H/o kidney stone Chronic LE edema L>R, mild  Hepatitis a, acute 04-2019 OSA  04-2020, on Cpap MSK: DJD, spinal stenosis  CT November 2022, incidental findings:  -Cirrhosis - R adrenal adenoma, -COPD (h/o tobacco) - coronary calcifications Vitamin D deficiency  PLAN HTN: Ambulatory BPs consistently in the 120/70, continue HCTZ, losartan. High cholesterol: Well-controlled Lipitor Paroxysmal A-fib, coronary calcifications: Saw cardiology 11/19/2021, felt to be stable. No chest pain.  Does report bradycardia at night.  Metoprolol is in his medication list but takes prn  only. Bradycardia was discussed with cardiology, no need for further evaluation at this point because he is asymptomatic Obesity: Seen at the weight management clinic 10/21/2021.  Does not plan to go back to them.  Doing well with diet, he remains extremely active. Cirrhosis: History of per  CT, will check a PT PTT Vitamin D  deficienc: Had ergocalciferol, currently not on supplements.  Check levels, encourage OTC vitamin D3 2000 units Preventive care reviewed RTC 6 months

## 2022-02-26 ENCOUNTER — Ambulatory Visit (INDEPENDENT_AMBULATORY_CARE_PROVIDER_SITE_OTHER): Payer: Medicare Other | Admitting: Psychology

## 2022-02-26 DIAGNOSIS — F4322 Adjustment disorder with anxiety: Secondary | ICD-10-CM | POA: Diagnosis not present

## 2022-02-26 NOTE — Progress Notes (Signed)
[facility] Counselor/Therapist Progress Note  Patient ID: Gregory Wilkins, MRN: [id number],    Date: 02/26/2022  Time Spent: 9:00am - 9:50am   50 minutes   Treatment Type: Individual Therapy  Reported Symptoms: stress  Mental Status Exam: Appearance:  Casual     Behavior: Appropriate  Motor: Normal  Speech/Language:  Normal Rate  Affect: Appropriate  Mood: normal  Thought process: normal  Thought content:   WNL  Sensory/Perceptual disturbances:   WNL  Orientation: oriented to person, place, time/date, and situation  Attention: Good  Concentration: Good  Memory: WNL  Fund of knowledge:  Good  Insight:   Good  Judgment:  Good  Impulse Control: Good   Risk Assessment: Danger to Self:  No Self-injurious Behavior: No Danger to Others: No Duty to Warn:no Physical Aggression / Violence:No  Access to Firearms a concern: No  Gang Involvement:No   Subjective:  Pt present for face-to-face individual therapy via video Webex.  Pt consents to telehealth video session due to COVID 19 pandemic. Location of pt: home Location of therapist: home office.   Pt talked about reminiscing about his family and summer vacations.   He had big summer vacations with extended family.  It was a positive experience that he has fond memories about.   Pt's mother was the matriarch and opened her arms to people readily.  Pt states both of his parents were "spectacular".    Pt talked about having a day recently when he thought about drinking.  Addressed what triggered his desire to drink.   Addressed how pt coped with it.   Pt remains solid in his sobriety.    Pt talked about being angry at his son who he states "is a bum and is not trying to get better".  Addressed the recent issues with his son and his addiction.   Pt is trying to not be angry at himself or his son.   Helped pt process his feelings.   Provided supportive therapy.     Interventions: Cognitive Behavioral Therapy and  Insight-Oriented  Diagnosis: F43.22   Plan of Care: Pt participated in setting treatment goals.   He is progressing toward goals.   Plan to continue to meet every two weeks.   Treatment Plan  (Treatment Plan Target Date 10/10/2022) Client Abilities/Strengths  Pt is bright, engaging and motivated for therapy.  Client Treatment Preferences  Individual therapy.  Client Statement of Needs  Improve coping skills.  Symptoms  Autonomic hyperactivity (e.g., palpitations, shortness of breath, dry mouth, trouble swallowing, nausea, diarrhea). Excessive and/or unrealistic worry that is difficult to control occurring more days than not for at least 6 months about a number of events or activities. Hypervigilance (e.g., feeling constantly on edge, experiencing concentration difficulties, having trouble falling or staying asleep, exhibiting a general state of irritability). Motor tension (e.g., restlessness, tiredness, shakiness, muscle tension). Problems Addressed  Anxiety Goals 1. Enhance ability to effectively cope with the full variety of life's worries and anxieties. 2. Learn and implement coping skills that result in a reduction of anxiety and worry, and improved daily functioning. Objective Learn to accept limitations in life and commit to tolerating, rather than avoiding, unpleasant emotions while accomplishing meaningful goals. Target Date: 2022-10-10 Frequency: Biweekly Progress: 50 Modality: individual Related Interventions 1. Use techniques from Acceptance and Commitment Therapy to help client accept uncomfortable realities such as lack of complete control, imperfections, and uncertainty and tolerate unpleasant emotions and thoughts in order to accomplish value-consistent goals. Objective  Learn and implement problem-solving strategies for realistically addressing worries. Target Date: 2022-10-10 Frequency: Biweekly Progress: 50 Modality: individual Related Interventions 1. Assign the  client a homework exercise in which he/she problem-solves a current problem.  review, reinforce success, and provide corrective feedback toward improvement. 2. Teach the client problem-solving strategies involving specifically defining a problem, generating options for addressing it, evaluating the pros and cons of each option, selecting and implementing an optional action, and reevaluating and refining the action. Objective Learn and implement calming skills to reduce overall anxiety and manage anxiety symptoms. Target Date: 2022-10-10 Frequency: Biweekly Progress: 50 Modality: individual Related Interventions 1. Assign the client to read about progressive muscle relaxation and other calming strategies in relevant books or treatment manuals (e.g., Progressive Relaxation Training by Gwynneth Aliment and Dani Gobble; Mastery of Your Anxiety and Worry: Workbook by Beckie Busing). 2. Assign the client homework each session in which he/she practices relaxation exercises daily, gradually applying them progressively from non-anxiety-provoking to anxiety-provoking situations; review and reinforce success while providing corrective feedback toward improvement. 3. Teach the client calming/relaxation skills (e.g., applied relaxation, progressive muscle relaxation, cue controlled relaxation; mindful breathing; biofeedback) and how to discriminate better between relaxation and tension; teach the client how to apply these skills to his/her daily life. 3. Reduce overall frequency, intensity, and duration of the anxiety so that daily functioning is not impaired. 4. Resolve the core conflict that is the source of anxiety. 5. Stabilize anxiety level while increasing ability to function on a daily basis. Diagnosis :    F43.22 Conditions For Discharge Achievement of treatment goals and objectives.  [name], LCSW

## 2022-03-05 ENCOUNTER — Encounter (INDEPENDENT_AMBULATORY_CARE_PROVIDER_SITE_OTHER): Payer: Self-pay

## 2022-03-12 ENCOUNTER — Ambulatory Visit (INDEPENDENT_AMBULATORY_CARE_PROVIDER_SITE_OTHER): Payer: Medicare Other | Admitting: Psychology

## 2022-03-12 DIAGNOSIS — F4322 Adjustment disorder with anxiety: Secondary | ICD-10-CM

## 2022-03-12 NOTE — Progress Notes (Signed)
[facility] Counselor/Therapist Progress Note  Patient ID: Gregory Wilkins, MRN: [id number],    Date: 03/12/2022  Time Spent: 9:00am - 9:50am   50 minutes   Treatment Type: Individual Therapy  Reported Symptoms: stress  Mental Status Exam: Appearance:  Casual     Behavior: Appropriate  Motor: Normal  Speech/Language:  Normal Rate  Affect: Appropriate  Mood: normal  Thought process: normal  Thought content:   WNL  Sensory/Perceptual disturbances:   WNL  Orientation: oriented to person, place, time/date, and situation  Attention: Good  Concentration: Good  Memory: WNL  Fund of knowledge:  Good  Insight:   Good  Judgment:  Good  Impulse Control: Good   Risk Assessment: Danger to Self:  No Self-injurious Behavior: No Danger to Others: No Duty to Warn:no Physical Aggression / Violence:No  Access to Firearms a concern: No  Gang Involvement:No   Subjective:  Pt present for face-to-face individual therapy via video Webex.  Pt consents to telehealth video session due to COVID 19 pandemic. Location of pt: home Location of therapist: home office.   Pt talked about frustrations with his wife.   She has been complaining a lot about their son and her complaining really gets on pt's nerves.   Addressed pt's frustrations and the constant stress they live in due to issues with their son.   Helped pt process his feelings and relationship dynamics.  Pt states he has felt hopeless at times the past two weeks bc of family dynamics.    Pt states he has been working on releasing and self forgiveness in his mantras and meditations.    Provided supportive therapy.     Interventions: Cognitive Behavioral Therapy and Insight-Oriented  Diagnosis: F43.22   Plan of Care: Pt participated in setting treatment goals.   He is progressing toward goals.   Plan to continue to meet every two weeks.   Treatment Plan  (Treatment Plan Target Date 10/10/2022) Client Abilities/Strengths   Pt is bright, engaging and motivated for therapy.  Client Treatment Preferences  Individual therapy.  Client Statement of Needs  Improve coping skills.  Symptoms  Autonomic hyperactivity (e.g., palpitations, shortness of breath, dry mouth, trouble swallowing, nausea, diarrhea). Excessive and/or unrealistic worry that is difficult to control occurring more days than not for at least 6 months about a number of events or activities. Hypervigilance (e.g., feeling constantly on edge, experiencing concentration difficulties, having trouble falling or staying asleep, exhibiting a general state of irritability). Motor tension (e.g., restlessness, tiredness, shakiness, muscle tension). Problems Addressed  Anxiety Goals 1. Enhance ability to effectively cope with the full variety of life's worries and anxieties. 2. Learn and implement coping skills that result in a reduction of anxiety and worry, and improved daily functioning. Objective Learn to accept limitations in life and commit to tolerating, rather than avoiding, unpleasant emotions while accomplishing meaningful goals. Target Date: 2022-10-10 Frequency: Biweekly Progress: 50 Modality: individual Related Interventions 1. Use techniques from Acceptance and Commitment Therapy to help client accept uncomfortable realities such as lack of complete control, imperfections, and uncertainty and tolerate unpleasant emotions and thoughts in order to accomplish value-consistent goals. Objective Learn and implement problem-solving strategies for realistically addressing worries. Target Date: 2022-10-10 Frequency: Biweekly Progress: 50 Modality: individual Related Interventions 1. Assign the client a homework exercise in which he/she problem-solves a current problem.  review, reinforce success, and provide corrective feedback toward improvement. 2. Teach the client problem-solving strategies involving specifically defining a problem, generating options for  addressing it, evaluating the pros and cons of each option, selecting and implementing an optional action, and reevaluating and refining the action. Objective Learn and implement calming skills to reduce overall anxiety and manage anxiety symptoms. Target Date: 2022-10-10 Frequency: Biweekly Progress: 50 Modality: individual Related Interventions 1. Assign the client to read about progressive muscle relaxation and other calming strategies in relevant books or treatment manuals (e.g., Progressive Relaxation Training by Gwynneth Aliment and Dani Gobble; Mastery of Your Anxiety and Worry: Workbook by Beckie Busing). 2. Assign the client homework each session in which he/she practices relaxation exercises daily, gradually applying them progressively from non-anxiety-provoking to anxiety-provoking situations; review and reinforce success while providing corrective feedback toward improvement. 3. Teach the client calming/relaxation skills (e.g., applied relaxation, progressive muscle relaxation, cue controlled relaxation; mindful breathing; biofeedback) and how to discriminate better between relaxation and tension; teach the client how to apply these skills to his/her daily life. 3. Reduce overall frequency, intensity, and duration of the anxiety so that daily functioning is not impaired. 4. Resolve the core conflict that is the source of anxiety. 5. Stabilize anxiety level while increasing ability to function on a daily basis. Diagnosis :    F43.22 Conditions For Discharge Achievement of treatment goals and objectives.  Latrice Storlie, LCSW

## 2022-03-26 ENCOUNTER — Ambulatory Visit (INDEPENDENT_AMBULATORY_CARE_PROVIDER_SITE_OTHER): Payer: Medicare Other | Admitting: Psychology

## 2022-03-26 DIAGNOSIS — F4322 Adjustment disorder with anxiety: Secondary | ICD-10-CM | POA: Diagnosis not present

## 2022-03-26 NOTE — Progress Notes (Signed)
[place] Counselor/Therapist Progress Note  Patient ID: KENG JEWEL, MRN: [id number],    Date: 03/26/2022  Time Spent: 9:00am - 9:50am   50 minutes   Treatment Type: Individual Therapy  Reported Symptoms: stress  Mental Status Exam: Appearance:  Casual     Behavior: Appropriate  Motor: Normal  Speech/Language:  Normal Rate  Affect: Appropriate  Mood: normal  Thought process: normal  Thought content:   WNL  Sensory/Perceptual disturbances:   WNL  Orientation: oriented to person, place, time/date, and situation  Attention: Good  Concentration: Good  Memory: WNL  Fund of knowledge:  Good  Insight:   Good  Judgment:  Good  Impulse Control: Good   Risk Assessment: Danger to Self:  No Self-injurious Behavior: No Danger to Others: No Duty to Warn:no Physical Aggression / Violence:No  Access to Firearms a concern: No  Gang Involvement:No   Subjective:  Pt present for face-to-face individual therapy via video Webex.  Pt consents to telehealth video session due to COVID 19 pandemic. Location of pt: home Location of therapist: home office.   Pt talked about issues with his wife.   She blames him for everything and rants at him.  Pt tries to not engage and walks away from her verbal attacks.  Pt's son is still living with him and using drugs.   Pt states he has been working on releasing and self forgiveness in his mantras and meditations.    Provided supportive therapy.     Interventions: Cognitive Behavioral Therapy and Insight-Oriented  Diagnosis: F43.22   Plan of Care: Pt participated in setting treatment goals.   He is progressing toward goals.   Plan to continue to meet every two weeks.   Treatment Plan  (Treatment Plan Target Date 10/10/2022) Client Abilities/Strengths  Pt is bright, engaging and motivated for therapy.  Client Treatment Preferences  Individual therapy.  Client Statement of Needs  Improve coping skills.  Symptoms   Autonomic hyperactivity (e.g., palpitations, shortness of breath, dry mouth, trouble swallowing, nausea, diarrhea). Excessive and/or unrealistic worry that is difficult to control occurring more days than not for at least 6 months about a number of events or activities. Hypervigilance (e.g., feeling constantly on edge, experiencing concentration difficulties, having trouble falling or staying asleep, exhibiting a general state of irritability). Motor tension (e.g., restlessness, tiredness, shakiness, muscle tension). Problems Addressed  Anxiety Goals 1. Enhance ability to effectively cope with the full variety of life's worries and anxieties. 2. Learn and implement coping skills that result in a reduction of anxiety and worry, and improved daily functioning. Objective Learn to accept limitations in life and commit to tolerating, rather than avoiding, unpleasant emotions while accomplishing meaningful goals. Target Date: 2022-10-10 Frequency: Biweekly Progress: 50 Modality: individual Related Interventions 1. Use techniques from Acceptance and Commitment Therapy to help client accept uncomfortable realities such as lack of complete control, imperfections, and uncertainty and tolerate unpleasant emotions and thoughts in order to accomplish value-consistent goals. Objective Learn and implement problem-solving strategies for realistically addressing worries. Target Date: 2022-10-10 Frequency: Biweekly Progress: 50 Modality: individual Related Interventions 1. Assign the client a homework exercise in which he/she problem-solves a current problem.  review, reinforce success, and provide corrective feedback toward improvement. 2. Teach the client problem-solving strategies involving specifically defining a problem, generating options for addressing it, evaluating the pros and cons of each option, selecting and implementing an optional action, and reevaluating and refining the action. Objective Learn  and implement calming skills to reduce  overall anxiety and manage anxiety symptoms. Target Date: 2022-10-10 Frequency: Biweekly Progress: 50 Modality: individual Related Interventions 1. Assign the client to read about progressive muscle relaxation and other calming strategies in relevant books or treatment manuals (e.g., Progressive Relaxation Training by Gwynneth Aliment and Dani Gobble; Mastery of Your Anxiety and Worry: Workbook by Beckie Busing). 2. Assign the client homework each session in which he/she practices relaxation exercises daily, gradually applying them progressively from non-anxiety-provoking to anxiety-provoking situations; review and reinforce success while providing corrective feedback toward improvement. 3. Teach the client calming/relaxation skills (e.g., applied relaxation, progressive muscle relaxation, cue controlled relaxation; mindful breathing; biofeedback) and how to discriminate better between relaxation and tension; teach the client how to apply these skills to his/her daily life. 3. Reduce overall frequency, intensity, and duration of the anxiety so that daily functioning is not impaired. 4. Resolve the core conflict that is the source of anxiety. 5. Stabilize anxiety level while increasing ability to function on a daily basis. Diagnosis :    F43.22 Conditions For Discharge Achievement of treatment goals and objectives.  Kaveh Kissinger, LCSW

## 2022-04-02 ENCOUNTER — Other Ambulatory Visit: Payer: Self-pay | Admitting: Cardiology

## 2022-04-02 DIAGNOSIS — I4892 Unspecified atrial flutter: Secondary | ICD-10-CM

## 2022-04-02 NOTE — Telephone Encounter (Signed)
Prescription refill request for Eliquis received. Indication: PAF Last office visit: 11/19/21 R Revankar MD Scr: 1.09 on 02/25/22 Age: 81 Weight: 89kg  Based on above findings Eliquis '5mg'$  twice daily is the appropriate dose.  Refill approved.

## 2022-04-08 ENCOUNTER — Ambulatory Visit (INDEPENDENT_AMBULATORY_CARE_PROVIDER_SITE_OTHER): Payer: Medicare Other | Admitting: Pulmonary Disease

## 2022-04-08 ENCOUNTER — Encounter: Payer: Self-pay | Admitting: Pulmonary Disease

## 2022-04-08 VITALS — BP 120/66 | HR 54 | Ht 70.0 in | Wt 201.2 lb

## 2022-04-08 DIAGNOSIS — I2584 Coronary atherosclerosis due to calcified coronary lesion: Secondary | ICD-10-CM

## 2022-04-08 DIAGNOSIS — J410 Simple chronic bronchitis: Secondary | ICD-10-CM

## 2022-04-08 DIAGNOSIS — G4733 Obstructive sleep apnea (adult) (pediatric): Secondary | ICD-10-CM

## 2022-04-08 DIAGNOSIS — I2510 Atherosclerotic heart disease of native coronary artery without angina pectoris: Secondary | ICD-10-CM | POA: Diagnosis not present

## 2022-04-08 NOTE — Patient Instructions (Signed)
X trial of airfit F30 full face mask  X obtain download from [employer]

## 2022-04-08 NOTE — Progress Notes (Signed)
   Subjective:    Patient ID: Gregory Wilkins, male    DOB: 1940/11/29, 81 y.o.   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 He takes clonazepam about 3 days a week for insomnia and restless legs  He was started on CPAP 9 cm in 2021 Last seen 03/2021 - residual AHI 8/h noted on downloads He reports good improvement in his daytime somnolence and fatigue with CPAP use.  He reports good compliance.  Denies any problems with pressure although mask is large and wishes he could have a better mask. Denies sleep pressure in the afternoons. No download is available today  Significant tests/ events reviewed CT chest without con 03/2020 -minimal emphysema, cirrhotic liver  Home sleep study 04/03/20 showed severe obstructive sleep apnea, AHI 57/hr.  CPAP titration 05/17/20>> 9 cm H20, no O2  Review of Systems neg for any significant sore throat, dysphagia, itching, sneezing, nasal congestion or excess/ purulent secretions, fever, chills, sweats, unintended wt loss, pleuritic or exertional cp, hempoptysis, orthopnea pnd or change in chronic leg swelling. Also denies presyncope, palpitations, heartburn, abdominal pain, nausea, vomiting, diarrhea or change in bowel or urinary habits, dysuria,hematuria, rash, arthralgias, visual complaints, headache, numbness weakness or ataxia.     Objective:   Physical Exam  Gen. Pleasant, elderly,well-nourished, in no distress ENT - no thrush, no pallor/icterus,no post nasal drip Neck: No JVD, no thyromegaly, no carotid bruits Lungs: no use of accessory muscles, no dullness to percussion, clear without rales or rhonchi  Cardiovascular: Rhythm regular, heart sounds  normal, no murmurs or gallops, no peripheral  edema Musculoskeletal: No deformities, no cyanosis or clubbing        Assessment & Plan:

## 2022-04-08 NOTE — Assessment & Plan Note (Signed)
Continue CPAP of 9 cm.  He is very compliant by history and CPAP is only helped improve his daytime somnolence and fatigue I discussed different types of fullface mask and he would like to try AirFit F30 We will try to obtain a CPAP download  Weight loss encouraged, compliance with goal of at least 4-6 hrs every night is the expectation. Advised against medications with sedative side effects Cautioned against driving when sleepy - understanding that sleepiness will vary on a day to day basis

## 2022-04-08 NOTE — Assessment & Plan Note (Signed)
Mild emphysema noted on CT chest but not symptomatic so hold off on PFTs, does not want bronchodilators

## 2022-04-09 ENCOUNTER — Ambulatory Visit (INDEPENDENT_AMBULATORY_CARE_PROVIDER_SITE_OTHER): Payer: Medicare Other | Admitting: Psychology

## 2022-04-09 DIAGNOSIS — F4322 Adjustment disorder with anxiety: Secondary | ICD-10-CM

## 2022-04-09 NOTE — Progress Notes (Signed)
[place] Counselor/Therapist Progress Note  Patient ID: CHADRICK SPRINKLE, MRN: [id number],    Date: 04/09/2022  Time Spent: 9:00am - 9:50am   50 minutes   Treatment Type: Individual Therapy  Reported Symptoms: stress  Mental Status Exam: Appearance:  Casual     Behavior: Appropriate  Motor: Normal  Speech/Language:  Normal Rate  Affect: Appropriate  Mood: normal  Thought process: normal  Thought content:   WNL  Sensory/Perceptual disturbances:   WNL  Orientation: oriented to person, place, time/date, and situation  Attention: Good  Concentration: Good  Memory: WNL  Fund of knowledge:  Good  Insight:   Good  Judgment:  Good  Impulse Control: Good   Risk Assessment: Danger to Self:  No Self-injurious Behavior: No Danger to Others: No Duty to Warn:no Physical Aggression / Violence:No  Access to Firearms a concern: No  Gang Involvement:No   Subjective:  Pt present for face-to-face individual therapy via video Webex.  Pt consents to telehealth video session due to COVID 19 pandemic. Location of pt: home Location of therapist: home office.   Pt talked about his work life and how much he enjoyed working and the time with colleagues.  He misses work and having goals.  Pt talked about family "drama".   Pt's wife's sister has been going through a depression cycle of her bipolar illness.   She had to be admitted to the psych hospital.  Addressed how this has impacted the family. Pt talked about his son.  He has times when it really bothers him that his son is still using and does not have a job.  Pt feels angry and at times reacts to his son and they get into it.   Pt then feels badly about himself for not handling things well.  Worked on Radiographer, therapeutic.   Pt states he has been working on releasing and self forgiveness in his mantras and meditations.    Provided supportive therapy.     Interventions: Cognitive Behavioral Therapy and  Insight-Oriented  Diagnosis: F43.22   Plan of Care: Pt participated in setting treatment goals.   He is progressing toward goals.   Plan to continue to meet every two weeks.   Treatment Plan  (Treatment Plan Target Date 10/10/2022) Client Abilities/Strengths  Pt is bright, engaging and motivated for therapy.  Client Treatment Preferences  Individual therapy.  Client Statement of Needs  Improve coping skills.  Symptoms  Autonomic hyperactivity (e.g., palpitations, shortness of breath, dry mouth, trouble swallowing, nausea, diarrhea). Excessive and/or unrealistic worry that is difficult to control occurring more days than not for at least 6 months about a number of events or activities. Hypervigilance (e.g., feeling constantly on edge, experiencing concentration difficulties, having trouble falling or staying asleep, exhibiting a general state of irritability). Motor tension (e.g., restlessness, tiredness, shakiness, muscle tension). Problems Addressed  Anxiety Goals 1. Enhance ability to effectively cope with the full variety of life's worries and anxieties. 2. Learn and implement coping skills that result in a reduction of anxiety and worry, and improved daily functioning. Objective Learn to accept limitations in life and commit to tolerating, rather than avoiding, unpleasant emotions while accomplishing meaningful goals. Target Date: 2022-10-10 Frequency: Biweekly Progress: 50 Modality: individual Related Interventions 1. Use techniques from Acceptance and Commitment Therapy to help client accept uncomfortable realities such as lack of complete control, imperfections, and uncertainty and tolerate unpleasant emotions and thoughts in order to accomplish value-consistent goals. Objective Learn and implement problem-solving strategies for  realistically addressing worries. Target Date: 2022-10-10 Frequency: Biweekly Progress: 50 Modality: individual Related Interventions 1. Assign the  client a homework exercise in which he/she problem-solves a current problem.  review, reinforce success, and provide corrective feedback toward improvement. 2. Teach the client problem-solving strategies involving specifically defining a problem, generating options for addressing it, evaluating the pros and cons of each option, selecting and implementing an optional action, and reevaluating and refining the action. Objective Learn and implement calming skills to reduce overall anxiety and manage anxiety symptoms. Target Date: 2022-10-10 Frequency: Biweekly Progress: 50 Modality: individual Related Interventions 1. Assign the client to read about progressive muscle relaxation and other calming strategies in relevant books or treatment manuals (e.g., Progressive Relaxation Training by Gwynneth Aliment and Dani Gobble; Mastery of Your Anxiety and Worry: Workbook by Beckie Busing). 2. Assign the client homework each session in which he/she practices relaxation exercises daily, gradually applying them progressively from non-anxiety-provoking to anxiety-provoking situations; review and reinforce success while providing corrective feedback toward improvement. 3. Teach the client calming/relaxation skills (e.g., applied relaxation, progressive muscle relaxation, cue controlled relaxation; mindful breathing; biofeedback) and how to discriminate better between relaxation and tension; teach the client how to apply these skills to his/her daily life. 3. Reduce overall frequency, intensity, and duration of the anxiety so that daily functioning is not impaired. 4. Resolve the core conflict that is the source of anxiety. 5. Stabilize anxiety level while increasing ability to function on a daily basis. Diagnosis :    F43.22 Conditions For Discharge Achievement of treatment goals and objectives.  Aime Meloche, LCSW

## 2022-04-23 ENCOUNTER — Ambulatory Visit (INDEPENDENT_AMBULATORY_CARE_PROVIDER_SITE_OTHER): Payer: Medicare Other | Admitting: Psychology

## 2022-04-23 DIAGNOSIS — F4322 Adjustment disorder with anxiety: Secondary | ICD-10-CM | POA: Diagnosis not present

## 2022-04-23 NOTE — Progress Notes (Signed)
[place] Counselor/Therapist Progress Note  Patient ID: Gregory Wilkins, MRN: [id number],    Date: 04/23/2022  Time Spent: 9:00am - 9:50am   50 minutes   Treatment Type: Individual Therapy  Reported Symptoms: stress  Mental Status Exam: Appearance:  Casual     Behavior: Appropriate  Motor: Normal  Speech/Language:  Normal Rate  Affect: Appropriate  Mood: normal  Thought process: normal  Thought content:   WNL  Sensory/Perceptual disturbances:   WNL  Orientation: oriented to person, place, time/date, and situation  Attention: Good  Concentration: Good  Memory: WNL  Fund of knowledge:  Good  Insight:   Good  Judgment:  Good  Impulse Control: Good   Risk Assessment: Danger to Self:  No Self-injurious Behavior: No Danger to Others: No Duty to Warn:no Physical Aggression / Violence:No  Access to Firearms a concern: No  Gang Involvement:No   Subjective:  Pt present for face-to-face individual therapy via video Webex.  Pt consents to telehealth video session due to COVID 19 pandemic. Location of pt: home Location of therapist: home office.   Pt talked about family concerns.  His sister in law has been in the psych hospital.  Pt has been a support to his brother in law through the crisis.   Pt talked about his son.  Pt's son has been paranoid which is hard to deal with.  Addressed pt's concerns about his son.  The issues with pt's son has taken it's toll on his marriage as well.   Worked on Radiographer, therapeutic.   Pt states he has been working on releasing and self forgiveness in his mantras and meditations.    Provided supportive therapy.     Interventions: Cognitive Behavioral Therapy and Insight-Oriented  Diagnosis: F43.22   Plan of Care: Pt participated in setting treatment goals.   He is progressing toward goals.   Plan to continue to meet every two weeks.   Treatment Plan  (Treatment Plan Target Date 10/10/2022) Client Abilities/Strengths  Pt is  bright, engaging and motivated for therapy.  Client Treatment Preferences  Individual therapy.  Client Statement of Needs  Improve coping skills.  Symptoms  Autonomic hyperactivity (e.g., palpitations, shortness of breath, dry mouth, trouble swallowing, nausea, diarrhea). Excessive and/or unrealistic worry that is difficult to control occurring more days than not for at least 6 months about a number of events or activities. Hypervigilance (e.g., feeling constantly on edge, experiencing concentration difficulties, having trouble falling or staying asleep, exhibiting a general state of irritability). Motor tension (e.g., restlessness, tiredness, shakiness, muscle tension). Problems Addressed  Anxiety Goals 1. Enhance ability to effectively cope with the full variety of life's worries and anxieties. 2. Learn and implement coping skills that result in a reduction of anxiety and worry, and improved daily functioning. Objective Learn to accept limitations in life and commit to tolerating, rather than avoiding, unpleasant emotions while accomplishing meaningful goals. Target Date: 2022-10-10 Frequency: Biweekly Progress: 50 Modality: individual Related Interventions 1. Use techniques from Acceptance and Commitment Therapy to help client accept uncomfortable realities such as lack of complete control, imperfections, and uncertainty and tolerate unpleasant emotions and thoughts in order to accomplish value-consistent goals. Objective Learn and implement problem-solving strategies for realistically addressing worries. Target Date: 2022-10-10 Frequency: Biweekly Progress: 50 Modality: individual Related Interventions 1. Assign the client a homework exercise in which he/she problem-solves a current problem.  review, reinforce success, and provide corrective feedback toward improvement. 2. Teach the client problem-solving strategies involving specifically defining a  problem, generating options for  addressing it, evaluating the pros and cons of each option, selecting and implementing an optional action, and reevaluating and refining the action. Objective Learn and implement calming skills to reduce overall anxiety and manage anxiety symptoms. Target Date: 2022-10-10 Frequency: Biweekly Progress: 50 Modality: individual Related Interventions 1. Assign the client to read about progressive muscle relaxation and other calming strategies in relevant books or treatment manuals (e.g., Progressive Relaxation Training by Gwynneth Aliment and Dani Gobble; Mastery of Your Anxiety and Worry: Workbook by Beckie Busing). 2. Assign the client homework each session in which he/she practices relaxation exercises daily, gradually applying them progressively from non-anxiety-provoking to anxiety-provoking situations; review and reinforce success while providing corrective feedback toward improvement. 3. Teach the client calming/relaxation skills (e.g., applied relaxation, progressive muscle relaxation, cue controlled relaxation; mindful breathing; biofeedback) and how to discriminate better between relaxation and tension; teach the client how to apply these skills to his/her daily life. 3. Reduce overall frequency, intensity, and duration of the anxiety so that daily functioning is not impaired. 4. Resolve the core conflict that is the source of anxiety. 5. Stabilize anxiety level while increasing ability to function on a daily basis. Diagnosis :    F43.22 Conditions For Discharge Achievement of treatment goals and objectives.  Zeniya Lapidus, LCSW

## 2022-05-07 ENCOUNTER — Ambulatory Visit (INDEPENDENT_AMBULATORY_CARE_PROVIDER_SITE_OTHER): Payer: Medicare Other | Admitting: Psychology

## 2022-05-07 DIAGNOSIS — F4322 Adjustment disorder with anxiety: Secondary | ICD-10-CM | POA: Diagnosis not present

## 2022-05-07 NOTE — Progress Notes (Signed)
[place] Counselor/Therapist Progress Note  Patient ID: Gregory Wilkins, MRN: [id number],    Date: 05/07/2022  Time Spent: 9:00am - 9:50am   50 minutes   Treatment Type: Individual Therapy  Reported Symptoms: stress  Mental Status Exam: Appearance:  Casual     Behavior: Appropriate  Motor: Normal  Speech/Language:  Normal Rate  Affect: Appropriate  Mood: normal  Thought process: normal  Thought content:   WNL  Sensory/Perceptual disturbances:   WNL  Orientation: oriented to person, place, time/date, and situation  Attention: Good  Concentration: Good  Memory: WNL  Fund of knowledge:  Good  Insight:   Good  Judgment:  Good  Impulse Control: Good   Risk Assessment: Danger to Self:  No Self-injurious Behavior: No Danger to Others: No Duty to Warn:no Physical Aggression / Violence:No  Access to Firearms a concern: No  Gang Involvement:No   Subjective:  Pt present for face-to-face individual therapy via video Webex.  Pt consents to telehealth video session due to COVID 19 pandemic. Location of pt: home Location of therapist: home office.   Pt talked about feeling down and frustrated.   He has had some upsetting interactions with his son.   It was his son's birthday last week and his son acted out.   He was saying mean things to pt and pt's wife Butch Penny.  They found drugs on their son.  Pt's wife set up a family therapy session for this afternoon.  Pt is going to go but he feels like he is in a negative place about the whole situation.  Helped pt process his feelings.   Pt talked about feeling like a failure regarding his son.   Worked on Radiographer, therapeutic.   Pt states he has been working on releasing and self forgiveness in his mantras and meditations.    Provided supportive therapy.     Interventions: Cognitive Behavioral Therapy and Insight-Oriented  Diagnosis: F43.22   Plan of Care: Pt participated in setting treatment goals.   He is progressing  toward goals.   Plan to continue to meet every two weeks.   Treatment Plan  (Treatment Plan Target Date 10/10/2022) Client Abilities/Strengths  Pt is bright, engaging and motivated for therapy.  Client Treatment Preferences  Individual therapy.  Client Statement of Needs  Improve coping skills.  Symptoms  Autonomic hyperactivity (e.g., palpitations, shortness of breath, dry mouth, trouble swallowing, nausea, diarrhea). Excessive and/or unrealistic worry that is difficult to control occurring more days than not for at least 6 months about a number of events or activities. Hypervigilance (e.g., feeling constantly on edge, experiencing concentration difficulties, having trouble falling or staying asleep, exhibiting a general state of irritability). Motor tension (e.g., restlessness, tiredness, shakiness, muscle tension). Problems Addressed  Anxiety Goals 1. Enhance ability to effectively cope with the full variety of life's worries and anxieties. 2. Learn and implement coping skills that result in a reduction of anxiety and worry, and improved daily functioning. Objective Learn to accept limitations in life and commit to tolerating, rather than avoiding, unpleasant emotions while accomplishing meaningful goals. Target Date: 2022-10-10 Frequency: Biweekly Progress: 50 Modality: individual Related Interventions 1. Use techniques from Acceptance and Commitment Therapy to help client accept uncomfortable realities such as lack of complete control, imperfections, and uncertainty and tolerate unpleasant emotions and thoughts in order to accomplish value-consistent goals. Objective Learn and implement problem-solving strategies for realistically addressing worries. Target Date: 2022-10-10 Frequency: Biweekly Progress: 50 Modality: individual Related Interventions 1. Assign the  client a homework exercise in which he/she problem-solves a current problem.  review, reinforce success, and provide  corrective feedback toward improvement. 2. Teach the client problem-solving strategies involving specifically defining a problem, generating options for addressing it, evaluating the pros and cons of each option, selecting and implementing an optional action, and reevaluating and refining the action. Objective Learn and implement calming skills to reduce overall anxiety and manage anxiety symptoms. Target Date: 2022-10-10 Frequency: Biweekly Progress: 50 Modality: individual Related Interventions 1. Assign the client to read about progressive muscle relaxation and other calming strategies in relevant books or treatment manuals (e.g., Progressive Relaxation Training by Gwynneth Aliment and Dani Gobble; Mastery of Your Anxiety and Worry: Workbook by Beckie Busing). 2. Assign the client homework each session in which he/she practices relaxation exercises daily, gradually applying them progressively from non-anxiety-provoking to anxiety-provoking situations; review and reinforce success while providing corrective feedback toward improvement. 3. Teach the client calming/relaxation skills (e.g., applied relaxation, progressive muscle relaxation, cue controlled relaxation; mindful breathing; biofeedback) and how to discriminate better between relaxation and tension; teach the client how to apply these skills to his/her daily life. 3. Reduce overall frequency, intensity, and duration of the anxiety so that daily functioning is not impaired. 4. Resolve the core conflict that is the source of anxiety. 5. Stabilize anxiety level while increasing ability to function on a daily basis. Diagnosis :    F43.22 Conditions For Discharge Achievement of treatment goals and objectives.  Azazel Franze, LCSW

## 2022-05-21 ENCOUNTER — Ambulatory Visit (INDEPENDENT_AMBULATORY_CARE_PROVIDER_SITE_OTHER): Payer: Medicare Other | Admitting: Psychology

## 2022-05-21 DIAGNOSIS — F4322 Adjustment disorder with anxiety: Secondary | ICD-10-CM

## 2022-05-21 NOTE — Progress Notes (Signed)
[place] Counselor/Therapist Progress Note  Patient ID: Gregory Wilkins, MRN: [id number],    Date: 05/21/2022  Time Spent: 9:00am - 9:50am   50 minutes   Treatment Type: Individual Therapy  Reported Symptoms: stress  Mental Status Exam: Appearance:  Casual     Behavior: Appropriate  Motor: Normal  Speech/Language:  Normal Rate  Affect: Appropriate  Mood: normal  Thought process: normal  Thought content:   WNL  Sensory/Perceptual disturbances:   WNL  Orientation: oriented to person, place, time/date, and situation  Attention: Good  Concentration: Good  Memory: WNL  Fund of knowledge:  Good  Insight:   Good  Judgment:  Good  Impulse Control: Good   Risk Assessment: Danger to Self:  No Self-injurious Behavior: No Danger to Others: No Duty to Warn:no Physical Aggression / Violence:No  Access to Firearms a concern: No  Gang Involvement:No   Subjective:  Pt present for face-to-face individual therapy via video Webex.  Pt consents to telehealth video session due to COVID 19 pandemic. Location of pt: home Location of therapist: home office.   Pt talked about his wife and how she eaves drops on their son.  Pt states his wife tries to find out what their son is doing and searches his room bc of his drug use.    Pt talked about his wife's oldest brother passing away.   Pt states the brother had a rough life.   Pt's wife is grieving.  Pt states he can not go to the funeral with his wife bc they can not leave their house unattended bc of their son's behavior.  Pt's son has been psychotic and paranoid.   Pt states he had such a difficult day yesterday that he thought about drinking.   He just wanted to "escape all the chaos."  Addressed pt's thoughts and helped him process his feelings.   Acknowledged pt for not acting on his thoughts.   Encouraged pt to go to [employer].  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  Worked on Radiographer, therapeutic.     Provided supportive therapy.     Interventions: Cognitive Behavioral Therapy and Insight-Oriented  Diagnosis: F43.22   Plan of Care: Pt participated in setting treatment goals.   He is progressing toward goals.   Plan to continue to meet every two weeks.   Treatment Plan  (Treatment Plan Target Date 10/10/2022) Client Abilities/Strengths  Pt is bright, engaging and motivated for therapy.  Client Treatment Preferences  Individual therapy.  Client Statement of Needs  Improve coping skills.  Symptoms  Autonomic hyperactivity (e.g., palpitations, shortness of breath, dry mouth, trouble swallowing, nausea, diarrhea). Excessive and/or unrealistic worry that is difficult to control occurring more days than not for at least 6 months about a number of events or activities. Hypervigilance (e.g., feeling constantly on edge, experiencing concentration difficulties, having trouble falling or staying asleep, exhibiting a general state of irritability). Motor tension (e.g., restlessness, tiredness, shakiness, muscle tension). Problems Addressed  Anxiety Goals 1. Enhance ability to effectively cope with the full variety of life's worries and anxieties. 2. Learn and implement coping skills that result in a reduction of anxiety and worry, and improved daily functioning. Objective Learn to accept limitations in life and commit to tolerating, rather than avoiding, unpleasant emotions while accomplishing meaningful goals. Target Date: 2022-10-10 Frequency: Biweekly Progress: 50 Modality: individual Related Interventions 1. Use techniques from Acceptance and Commitment Therapy to help client accept uncomfortable realities such  as lack of complete control, imperfections, and uncertainty and tolerate unpleasant emotions and thoughts in order to accomplish value-consistent goals. Objective Learn and implement problem-solving strategies for realistically addressing worries. Target Date: 2022-10-10 Frequency:  Biweekly Progress: 50 Modality: individual Related Interventions 1. Assign the client a homework exercise in which he/she problem-solves a current problem.  review, reinforce success, and provide corrective feedback toward improvement. 2. Teach the client problem-solving strategies involving specifically defining a problem, generating options for addressing it, evaluating the pros and cons of each option, selecting and implementing an optional action, and reevaluating and refining the action. Objective Learn and implement calming skills to reduce overall anxiety and manage anxiety symptoms. Target Date: 2022-10-10 Frequency: Biweekly Progress: 50 Modality: individual Related Interventions 1. Assign the client to read about progressive muscle relaxation and other calming strategies in relevant books or treatment manuals (e.g., Progressive Relaxation Training by Gwynneth Aliment and Dani Gobble; Mastery of Your Anxiety and Worry: Workbook by Beckie Busing). 2. Assign the client homework each session in which he/she practices relaxation exercises daily, gradually applying them progressively from non-anxiety-provoking to anxiety-provoking situations; review and reinforce success while providing corrective feedback toward improvement. 3. Teach the client calming/relaxation skills (e.g., applied relaxation, progressive muscle relaxation, cue controlled relaxation; mindful breathing; biofeedback) and how to discriminate better between relaxation and tension; teach the client how to apply these skills to his/her daily life. 3. Reduce overall frequency, intensity, and duration of the anxiety so that daily functioning is not impaired. 4. Resolve the core conflict that is the source of anxiety. 5. Stabilize anxiety level while increasing ability to function on a daily basis. Diagnosis :    F43.22 Conditions For Discharge Achievement of treatment goals and objectives.  Raybon Conard, LCSW

## 2022-06-04 ENCOUNTER — Ambulatory Visit (INDEPENDENT_AMBULATORY_CARE_PROVIDER_SITE_OTHER): Payer: Medicare Other | Admitting: Psychology

## 2022-06-04 DIAGNOSIS — F4322 Adjustment disorder with anxiety: Secondary | ICD-10-CM | POA: Diagnosis not present

## 2022-06-04 NOTE — Progress Notes (Signed)
[place] Counselor/Therapist Progress Note  Patient ID: Gregory Wilkins, MRN: [id number],    Date: 06/04/2022  Time Spent: 9:00am - 9:50am   50 minutes   Treatment Type: Individual Therapy  Reported Symptoms: stress  Mental Status Exam: Appearance:  Casual     Behavior: Appropriate  Motor: Normal  Speech/Language:  Normal Rate  Affect: Appropriate  Mood: normal  Thought process: normal  Thought content:   WNL  Sensory/Perceptual disturbances:   WNL  Orientation: oriented to person, place, time/date, and situation  Attention: Good  Concentration: Good  Memory: WNL  Fund of knowledge:  Good  Insight:   Good  Judgment:  Good  Impulse Control: Good   Risk Assessment: Danger to Self:  No Self-injurious Behavior: No Danger to Others: No Duty to Warn:no Physical Aggression / Violence:No  Access to Firearms a concern: No  Gang Involvement:No   Subjective:  Pt present for face-to-face individual therapy via video Webex.  Pt consents to telehealth video session due to COVID 19 pandemic. Location of pt: home Location of therapist: home office.   Pt talked about having a hard day yesterday.   He was in his thoughts a lot and was thinking about family and religion and his worries.  Addressed pt's thoughts and worries.  Helped pt process his feelings and worked on thought reframing.    Pt talked about his son.   His son has not left the house in a couple of weeks.   There have been some stressful interactions with him.  Addressed the interactions and helped pt process his feelings and relationship dynamics.  Worked on Radiographer, therapeutic.    Provided supportive therapy.     Interventions: Cognitive Behavioral Therapy and Insight-Oriented  Diagnosis: F43.22   Plan of Care: Pt participated in setting treatment goals.   He is progressing toward goals.   Plan to continue to meet every two weeks.   Treatment Plan  (Treatment Plan Target Date 10/10/2022) Client  Abilities/Strengths  Pt is bright, engaging and motivated for therapy.  Client Treatment Preferences  Individual therapy.  Client Statement of Needs  Improve coping skills.  Symptoms  Autonomic hyperactivity (e.g., palpitations, shortness of breath, dry mouth, trouble swallowing, nausea, diarrhea). Excessive and/or unrealistic worry that is difficult to control occurring more days than not for at least 6 months about a number of events or activities. Hypervigilance (e.g., feeling constantly on edge, experiencing concentration difficulties, having trouble falling or staying asleep, exhibiting a general state of irritability). Motor tension (e.g., restlessness, tiredness, shakiness, muscle tension). Problems Addressed  Anxiety Goals 1. Enhance ability to effectively cope with the full variety of life's worries and anxieties. 2. Learn and implement coping skills that result in a reduction of anxiety and worry, and improved daily functioning. Objective Learn to accept limitations in life and commit to tolerating, rather than avoiding, unpleasant emotions while accomplishing meaningful goals. Target Date: 2022-10-10 Frequency: Biweekly Progress: 50 Modality: individual Related Interventions 1. Use techniques from Acceptance and Commitment Therapy to help client accept uncomfortable realities such as lack of complete control, imperfections, and uncertainty and tolerate unpleasant emotions and thoughts in order to accomplish value-consistent goals. Objective Learn and implement problem-solving strategies for realistically addressing worries. Target Date: 2022-10-10 Frequency: Biweekly Progress: 50 Modality: individual Related Interventions 1. Assign the client a homework exercise in which he/she problem-solves a current problem.  review, reinforce success, and provide corrective feedback toward improvement. 2. Teach the client problem-solving strategies involving specifically defining a problem,  generating options for addressing it, evaluating the pros and cons of each option, selecting and implementing an optional action, and reevaluating and refining the action. Objective Learn and implement calming skills to reduce overall anxiety and manage anxiety symptoms. Target Date: 2022-10-10 Frequency: Biweekly Progress: 50 Modality: individual Related Interventions 1. Assign the client to read about progressive muscle relaxation and other calming strategies in relevant books or treatment manuals (e.g., Progressive Relaxation Training by Gwynneth Aliment and Dani Gobble; Mastery of Your Anxiety and Worry: Workbook by Beckie Busing). 2. Assign the client homework each session in which he/she practices relaxation exercises daily, gradually applying them progressively from non-anxiety-provoking to anxiety-provoking situations; review and reinforce success while providing corrective feedback toward improvement. 3. Teach the client calming/relaxation skills (e.g., applied relaxation, progressive muscle relaxation, cue controlled relaxation; mindful breathing; biofeedback) and how to discriminate better between relaxation and tension; teach the client how to apply these skills to his/her daily life. 3. Reduce overall frequency, intensity, and duration of the anxiety so that daily functioning is not impaired. 4. Resolve the core conflict that is the source of anxiety. 5. Stabilize anxiety level while increasing ability to function on a daily basis. Diagnosis :    F43.22 Conditions For Discharge Achievement of treatment goals and objectives.  Aquan Kope, LCSW

## 2022-06-18 ENCOUNTER — Ambulatory Visit (INDEPENDENT_AMBULATORY_CARE_PROVIDER_SITE_OTHER): Payer: Medicare Other | Admitting: Psychology

## 2022-06-18 DIAGNOSIS — F4322 Adjustment disorder with anxiety: Secondary | ICD-10-CM

## 2022-06-18 NOTE — Progress Notes (Signed)
[place] Counselor/Therapist Progress Note  Patient ID: Gregory Wilkins, MRN: [id number],    Date: 06/18/2022  Time Spent: 9:00am - 9:50am   50 minutes   Treatment Type: Individual Therapy  Reported Symptoms: stress  Mental Status Exam: Appearance:  Casual     Behavior: Appropriate  Motor: Normal  Speech/Language:  Normal Rate  Affect: Appropriate  Mood: normal  Thought process: normal  Thought content:   WNL  Sensory/Perceptual disturbances:   WNL  Orientation: oriented to person, place, time/date, and situation  Attention: Good  Concentration: Good  Memory: WNL  Fund of knowledge:  Good  Insight:   Good  Judgment:  Good  Impulse Control: Good   Risk Assessment: Danger to Self:  No Self-injurious Behavior: No Danger to Others: No Duty to Warn:no Physical Aggression / Violence:No  Access to Firearms a concern: No  Gang Involvement:No   Subjective:  Pt present for face-to-face individual therapy via video Webex.  Pt consents to telehealth video session due to COVID 19 pandemic. Location of pt: home Location of therapist: home office.   Pt talked about thinking about his family in terms of wondering if he has to deal with the issues for some purpose.   Pt is working on "letting go" and "loosening his grip".   Pt talked about plans for Thanksgiving.  He is going to be with extended family and dreads it.   Addressed pt's thoughts and worries.  Helped pt process his feelings and worked on thought reframing.    Pt talked about his son.   Addressed the interactions and helped pt process his feelings and relationship dynamics.  Worked on Radiographer, therapeutic.    Provided supportive therapy.     Interventions: Cognitive Behavioral Therapy and Insight-Oriented  Diagnosis: F43.22   Plan of Care: Pt participated in setting treatment goals.   He is progressing toward goals.   Plan to continue to meet every two weeks.   Treatment Plan  (Treatment Plan Target  Date 10/10/2022) Client Abilities/Strengths  Pt is bright, engaging and motivated for therapy.  Client Treatment Preferences  Individual therapy.  Client Statement of Needs  Improve coping skills.  Symptoms  Autonomic hyperactivity (e.g., palpitations, shortness of breath, dry mouth, trouble swallowing, nausea, diarrhea). Excessive and/or unrealistic worry that is difficult to control occurring more days than not for at least 6 months about a number of events or activities. Hypervigilance (e.g., feeling constantly on edge, experiencing concentration difficulties, having trouble falling or staying asleep, exhibiting a general state of irritability). Motor tension (e.g., restlessness, tiredness, shakiness, muscle tension). Problems Addressed  Anxiety Goals 1. Enhance ability to effectively cope with the full variety of life's worries and anxieties. 2. Learn and implement coping skills that result in a reduction of anxiety and worry, and improved daily functioning. Objective Learn to accept limitations in life and commit to tolerating, rather than avoiding, unpleasant emotions while accomplishing meaningful goals. Target Date: 2022-10-10 Frequency: Biweekly Progress: 50 Modality: individual Related Interventions 1. Use techniques from Acceptance and Commitment Therapy to help client accept uncomfortable realities such as lack of complete control, imperfections, and uncertainty and tolerate unpleasant emotions and thoughts in order to accomplish value-consistent goals. Objective Learn and implement problem-solving strategies for realistically addressing worries. Target Date: 2022-10-10 Frequency: Biweekly Progress: 50 Modality: individual Related Interventions 1. Assign the client a homework exercise in which he/she problem-solves a current problem.  review, reinforce success, and provide corrective feedback toward improvement. 2. Teach the client problem-solving strategies  involving  specifically defining a problem, generating options for addressing it, evaluating the pros and cons of each option, selecting and implementing an optional action, and reevaluating and refining the action. Objective Learn and implement calming skills to reduce overall anxiety and manage anxiety symptoms. Target Date: 2022-10-10 Frequency: Biweekly Progress: 50 Modality: individual Related Interventions 1. Assign the client to read about progressive muscle relaxation and other calming strategies in relevant books or treatment manuals (e.g., Progressive Relaxation Training by Gwynneth Aliment and Dani Gobble; Mastery of Your Anxiety and Worry: Workbook by Beckie Busing). 2. Assign the client homework each session in which he/she practices relaxation exercises daily, gradually applying them progressively from non-anxiety-provoking to anxiety-provoking situations; review and reinforce success while providing corrective feedback toward improvement. 3. Teach the client calming/relaxation skills (e.g., applied relaxation, progressive muscle relaxation, cue controlled relaxation; mindful breathing; biofeedback) and how to discriminate better between relaxation and tension; teach the client how to apply these skills to his/her daily life. 3. Reduce overall frequency, intensity, and duration of the anxiety so that daily functioning is not impaired. 4. Resolve the core conflict that is the source of anxiety. 5. Stabilize anxiety level while increasing ability to function on a daily basis. Diagnosis :    F43.22 Conditions For Discharge Achievement of treatment goals and objectives.  Danisa Kopec, LCSW

## 2022-07-02 ENCOUNTER — Ambulatory Visit (INDEPENDENT_AMBULATORY_CARE_PROVIDER_SITE_OTHER): Payer: Medicare Other | Admitting: Psychology

## 2022-07-02 DIAGNOSIS — F4322 Adjustment disorder with anxiety: Secondary | ICD-10-CM | POA: Diagnosis not present

## 2022-07-02 NOTE — Progress Notes (Signed)
[facility] Counselor/Therapist Progress Note  Patient ID: Gregory Wilkins, MRN: [id number],    Date: 07/02/2022  Time Spent: 9:00am - 9:50am   50 minutes   Treatment Type: Individual Therapy  Reported Symptoms: stress  Mental Status Exam: Appearance:  Casual     Behavior: Appropriate  Motor: Normal  Speech/Language:  Normal Rate  Affect: Appropriate  Mood: normal  Thought process: normal  Thought content:   WNL  Sensory/Perceptual disturbances:   WNL  Orientation: oriented to person, place, time/date, and situation  Attention: Good  Concentration: Good  Memory: WNL  Fund of knowledge:  Good  Insight:   Good  Judgment:  Good  Impulse Control: Good   Risk Assessment: Danger to Self:  No Self-injurious Behavior: No Danger to Others: No Duty to Warn:no Physical Aggression / Violence:No  Access to Firearms a concern: No  Gang Involvement:No   Subjective:  Pt present for face-to-face individual therapy via video Webex.  Pt consents to telehealth video session due to COVID 19 pandemic. Location of pt: home Location of therapist: home office.   Pt talked about  Thanksgiving.  He was with extended family and felt exhausted afterward.   His son did not attend which was a relief but also something pt felt badly about. Helped pt process his feelings and family dynamics.  Addressed pt's thoughts and worries.  Helped pt process his feelings and worked on thought reframing.    Pt talked about his son.   Addressed the interactions and helped pt process his feelings and relationship dynamics.  Worked on Radiographer, therapeutic.    Provided supportive therapy.     Interventions: Cognitive Behavioral Therapy and Insight-Oriented  Diagnosis: F43.22   Plan of Care: Pt participated in setting treatment goals.   He is progressing toward goals.   Plan to continue to meet every two weeks.   Treatment Plan  (Treatment Plan Target Date 10/10/2022) Client Abilities/Strengths   Pt is bright, engaging and motivated for therapy.  Client Treatment Preferences  Individual therapy.  Client Statement of Needs  Improve coping skills.  Symptoms  Autonomic hyperactivity (e.g., palpitations, shortness of breath, dry mouth, trouble swallowing, nausea, diarrhea). Excessive and/or unrealistic worry that is difficult to control occurring more days than not for at least 6 months about a number of events or activities. Hypervigilance (e.g., feeling constantly on edge, experiencing concentration difficulties, having trouble falling or staying asleep, exhibiting a general state of irritability). Motor tension (e.g., restlessness, tiredness, shakiness, muscle tension). Problems Addressed  Anxiety Goals 1. Enhance ability to effectively cope with the full variety of life's worries and anxieties. 2. Learn and implement coping skills that result in a reduction of anxiety and worry, and improved daily functioning. Objective Learn to accept limitations in life and commit to tolerating, rather than avoiding, unpleasant emotions while accomplishing meaningful goals. Target Date: 2022-10-10 Frequency: Biweekly Progress: 50 Modality: individual Related Interventions 1. Use techniques from Acceptance and Commitment Therapy to help client accept uncomfortable realities such as lack of complete control, imperfections, and uncertainty and tolerate unpleasant emotions and thoughts in order to accomplish value-consistent goals. Objective Learn and implement problem-solving strategies for realistically addressing worries. Target Date: 2022-10-10 Frequency: Biweekly Progress: 50 Modality: individual Related Interventions 1. Assign the client a homework exercise in which he/she problem-solves a current problem.  review, reinforce success, and provide corrective feedback toward improvement. 2. Teach the client problem-solving strategies involving specifically defining a problem, generating options for  addressing it, evaluating the pros  and cons of each option, selecting and implementing an optional action, and reevaluating and refining the action. Objective Learn and implement calming skills to reduce overall anxiety and manage anxiety symptoms. Target Date: 2022-10-10 Frequency: Biweekly Progress: 50 Modality: individual Related Interventions 1. Assign the client to read about progressive muscle relaxation and other calming strategies in relevant books or treatment manuals (e.g., Progressive Relaxation Training by Gwynneth Aliment and Dani Gobble; Mastery of Your Anxiety and Worry: Workbook by Beckie Busing). 2. Assign the client homework each session in which he/she practices relaxation exercises daily, gradually applying them progressively from non-anxiety-provoking to anxiety-provoking situations; review and reinforce success while providing corrective feedback toward improvement. 3. Teach the client calming/relaxation skills (e.g., applied relaxation, progressive muscle relaxation, cue controlled relaxation; mindful breathing; biofeedback) and how to discriminate better between relaxation and tension; teach the client how to apply these skills to his/her daily life. 3. Reduce overall frequency, intensity, and duration of the anxiety so that daily functioning is not impaired. 4. Resolve the core conflict that is the source of anxiety. 5. Stabilize anxiety level while increasing ability to function on a daily basis. Diagnosis :    F43.22 Conditions For Discharge Achievement of treatment goals and objectives.  Babita Amaker, LCSW

## 2022-07-11 ENCOUNTER — Telehealth: Payer: Self-pay | Admitting: Internal Medicine

## 2022-07-11 DIAGNOSIS — G2581 Restless legs syndrome: Secondary | ICD-10-CM

## 2022-07-14 NOTE — Telephone Encounter (Signed)
Requesting: clonazepam 0.'5mg'$   Contract: 08/28/20 UDS: 08/28/20 Last Visit: 02/25/22 Next Visit: 09/02/22 Last Refill: 12/12/21 #90 and 1RF  Please Advise

## 2022-07-14 NOTE — Telephone Encounter (Signed)
PDMP okay, Rx sent 

## 2022-07-16 ENCOUNTER — Ambulatory Visit (INDEPENDENT_AMBULATORY_CARE_PROVIDER_SITE_OTHER): Payer: Medicare Other | Admitting: Psychology

## 2022-07-16 DIAGNOSIS — F4322 Adjustment disorder with anxiety: Secondary | ICD-10-CM | POA: Diagnosis not present

## 2022-07-16 NOTE — Progress Notes (Signed)
[facility] Counselor/Therapist Progress Note  Patient ID: Gregory Wilkins, MRN: [id number],    Date: 07/16/2022  Time Spent: 9:00am - 9:50am   50 minutes   Treatment Type: Individual Therapy  Reported Symptoms: stress  Mental Status Exam: Appearance:  Casual     Behavior: Appropriate  Motor: Normal  Speech/Language:  Normal Rate  Affect: Appropriate  Mood: normal  Thought process: normal  Thought content:   WNL  Sensory/Perceptual disturbances:   WNL  Orientation: oriented to person, place, time/date, and situation  Attention: Good  Concentration: Good  Memory: WNL  Fund of knowledge:  Good  Insight:   Good  Judgment:  Good  Impulse Control: Good   Risk Assessment: Danger to Self:  No Self-injurious Behavior: No Danger to Others: No Duty to Warn:no Physical Aggression / Violence:No  Access to Firearms a concern: No  Gang Involvement:No   Subjective:  Pt present for face-to-face individual therapy via video Webex.  Pt consents to telehealth video session due to COVID 19 pandemic. Location of pt: home Location of therapist: home office.   Pt talked about the upcoming Christmas holiday.   He will have a Christmas Eve dinner with extended family and is sad that his son is not in good enough shape emotionally to go. Helped pt process his feelings and family dynamics.  Pt talked about his son.  His son is depressed and has talked with pt about his feelings about his life.   Addressed the interactions and helped pt process his feelings and relationship dynamics.  Worked on Radiographer, therapeutic.    Provided supportive therapy.     Interventions: Cognitive Behavioral Therapy and Insight-Oriented  Diagnosis: F43.22   Plan of Care: Pt participated in setting treatment goals.   He is progressing toward goals.   Plan to continue to meet every two weeks.   Treatment Plan  (Treatment Plan Target Date 10/10/2022) Client Abilities/Strengths  Pt is bright,  engaging and motivated for therapy.  Client Treatment Preferences  Individual therapy.  Client Statement of Needs  Improve coping skills.  Symptoms  Autonomic hyperactivity (e.g., palpitations, shortness of breath, dry mouth, trouble swallowing, nausea, diarrhea). Excessive and/or unrealistic worry that is difficult to control occurring more days than not for at least 6 months about a number of events or activities. Hypervigilance (e.g., feeling constantly on edge, experiencing concentration difficulties, having trouble falling or staying asleep, exhibiting a general state of irritability). Motor tension (e.g., restlessness, tiredness, shakiness, muscle tension). Problems Addressed  Anxiety Goals 1. Enhance ability to effectively cope with the full variety of life's worries and anxieties. 2. Learn and implement coping skills that result in a reduction of anxiety and worry, and improved daily functioning. Objective Learn to accept limitations in life and commit to tolerating, rather than avoiding, unpleasant emotions while accomplishing meaningful goals. Target Date: 2022-10-10 Frequency: Biweekly Progress: 50 Modality: individual Related Interventions 1. Use techniques from Acceptance and Commitment Therapy to help client accept uncomfortable realities such as lack of complete control, imperfections, and uncertainty and tolerate unpleasant emotions and thoughts in order to accomplish value-consistent goals. Objective Learn and implement problem-solving strategies for realistically addressing worries. Target Date: 2022-10-10 Frequency: Biweekly Progress: 50 Modality: individual Related Interventions 1. Assign the client a homework exercise in which he/she problem-solves a current problem.  review, reinforce success, and provide corrective feedback toward improvement. 2. Teach the client problem-solving strategies involving specifically defining a problem, generating options for addressing it,  evaluating the pros and cons  of each option, selecting and implementing an optional action, and reevaluating and refining the action. Objective Learn and implement calming skills to reduce overall anxiety and manage anxiety symptoms. Target Date: 2022-10-10 Frequency: Biweekly Progress: 50 Modality: individual Related Interventions 1. Assign the client to read about progressive muscle relaxation and other calming strategies in relevant books or treatment manuals (e.g., Progressive Relaxation Training by Gwynneth Aliment and Dani Gobble; Mastery of Your Anxiety and Worry: Workbook by Beckie Busing). 2. Assign the client homework each session in which he/she practices relaxation exercises daily, gradually applying them progressively from non-anxiety-provoking to anxiety-provoking situations; review and reinforce success while providing corrective feedback toward improvement. 3. Teach the client calming/relaxation skills (e.g., applied relaxation, progressive muscle relaxation, cue controlled relaxation; mindful breathing; biofeedback) and how to discriminate better between relaxation and tension; teach the client how to apply these skills to his/her daily life. 3. Reduce overall frequency, intensity, and duration of the anxiety so that daily functioning is not impaired. 4. Resolve the core conflict that is the source of anxiety. 5. Stabilize anxiety level while increasing ability to function on a daily basis. Diagnosis :    F43.22 Conditions For Discharge Achievement of treatment goals and objectives.  Corbyn Wildey, LCSW

## 2022-07-30 ENCOUNTER — Ambulatory Visit (INDEPENDENT_AMBULATORY_CARE_PROVIDER_SITE_OTHER): Payer: Medicare Other | Admitting: Psychology

## 2022-07-30 DIAGNOSIS — F4322 Adjustment disorder with anxiety: Secondary | ICD-10-CM

## 2022-07-30 NOTE — Progress Notes (Signed)
[facility] Counselor/Therapist Progress Note  Patient ID: Gregory Wilkins, MRN: [id number],    Date: 07/30/2022  Time Spent: 9:00am - 9:50am   50 minutes   Treatment Type: Individual Therapy  Reported Symptoms: stress  Mental Status Exam: Appearance:  Casual     Behavior: Appropriate  Motor: Normal  Speech/Language:  Normal Rate  Affect: Appropriate  Mood: normal  Thought process: normal  Thought content:   WNL  Sensory/Perceptual disturbances:   WNL  Orientation: oriented to person, place, time/date, and situation  Attention: Good  Concentration: Good  Memory: WNL  Fund of knowledge:  Good  Insight:   Good  Judgment:  Good  Impulse Control: Good   Risk Assessment: Danger to Self:  No Self-injurious Behavior: No Danger to Others: No Duty to Warn:no Physical Aggression / Violence:No  Access to Firearms a concern: No  Gang Involvement:No   Subjective:  Pt present for face-to-face individual therapy via video Webex.  Pt consents to telehealth video session due to COVID 19 pandemic. Location of pt: home Location of therapist: home office.   Pt talked about things being difficult at home.   His son is exhibiting a lot of paranoid behaviors.   It is really getting to him and his wife.   Pt is feeling very frustrated.  His son's behavior is very disruptive and his wife blows up at times which makes things more difficult.   Addressed the interactions and helped pt process his feelings and relationship dynamics.  Worked on Radiographer, therapeutic.    Provided supportive therapy.     Interventions: Cognitive Behavioral Therapy and Insight-Oriented  Diagnosis: F43.22   Plan of Care: Pt participated in setting treatment goals.   He is progressing toward goals.   Plan to continue to meet every two weeks.   Treatment Plan  (Treatment Plan Target Date 10/10/2022) Client Abilities/Strengths  Pt is bright, engaging and motivated for therapy.  Client Treatment  Preferences  Individual therapy.  Client Statement of Needs  Improve coping skills.  Symptoms  Autonomic hyperactivity (e.g., palpitations, shortness of breath, dry mouth, trouble swallowing, nausea, diarrhea). Excessive and/or unrealistic worry that is difficult to control occurring more days than not for at least 6 months about a number of events or activities. Hypervigilance (e.g., feeling constantly on edge, experiencing concentration difficulties, having trouble falling or staying asleep, exhibiting a general state of irritability). Motor tension (e.g., restlessness, tiredness, shakiness, muscle tension). Problems Addressed  Anxiety Goals 1. Enhance ability to effectively cope with the full variety of life's worries and anxieties. 2. Learn and implement coping skills that result in a reduction of anxiety and worry, and improved daily functioning. Objective Learn to accept limitations in life and commit to tolerating, rather than avoiding, unpleasant emotions while accomplishing meaningful goals. Target Date: 2022-10-10 Frequency: Biweekly Progress: 50 Modality: individual Related Interventions 1. Use techniques from Acceptance and Commitment Therapy to help client accept uncomfortable realities such as lack of complete control, imperfections, and uncertainty and tolerate unpleasant emotions and thoughts in order to accomplish value-consistent goals. Objective Learn and implement problem-solving strategies for realistically addressing worries. Target Date: 2022-10-10 Frequency: Biweekly Progress: 50 Modality: individual Related Interventions 1. Assign the client a homework exercise in which he/she problem-solves a current problem.  review, reinforce success, and provide corrective feedback toward improvement. 2. Teach the client problem-solving strategies involving specifically defining a problem, generating options for addressing it, evaluating the pros and cons of each option, selecting  and implementing an optional  action, and reevaluating and refining the action. Objective Learn and implement calming skills to reduce overall anxiety and manage anxiety symptoms. Target Date: 2022-10-10 Frequency: Biweekly Progress: 50 Modality: individual Related Interventions 1. Assign the client to read about progressive muscle relaxation and other calming strategies in relevant books or treatment manuals (e.g., Progressive Relaxation Training by Gwynneth Aliment and Dani Gobble; Mastery of Your Anxiety and Worry: Workbook by Beckie Busing). 2. Assign the client homework each session in which he/she practices relaxation exercises daily, gradually applying them progressively from non-anxiety-provoking to anxiety-provoking situations; review and reinforce success while providing corrective feedback toward improvement. 3. Teach the client calming/relaxation skills (e.g., applied relaxation, progressive muscle relaxation, cue controlled relaxation; mindful breathing; biofeedback) and how to discriminate better between relaxation and tension; teach the client how to apply these skills to his/her daily life. 3. Reduce overall frequency, intensity, and duration of the anxiety so that daily functioning is not impaired. 4. Resolve the core conflict that is the source of anxiety. 5. Stabilize anxiety level while increasing ability to function on a daily basis. Diagnosis :    F43.22 Conditions For Discharge Achievement of treatment goals and objectives.  Gregory Coleson, LCSW

## 2022-08-04 ENCOUNTER — Other Ambulatory Visit: Payer: Self-pay | Admitting: Internal Medicine

## 2022-08-13 ENCOUNTER — Ambulatory Visit (INDEPENDENT_AMBULATORY_CARE_PROVIDER_SITE_OTHER): Payer: Medicare Other | Admitting: Psychology

## 2022-08-13 DIAGNOSIS — F4322 Adjustment disorder with anxiety: Secondary | ICD-10-CM | POA: Diagnosis not present

## 2022-08-13 NOTE — Progress Notes (Signed)
[facility] Counselor/Therapist Progress Note  Patient ID: Gregory Wilkins, MRN: [id number],    Date: 08/13/2022  Time Spent: 9:00am - 9:50am   50 minutes   Treatment Type: Individual Therapy  Reported Symptoms: stress  Mental Status Exam: Appearance:  Casual     Behavior: Appropriate  Motor: Normal  Speech/Language:  Normal Rate  Affect: Appropriate  Mood: normal  Thought process: normal  Thought content:   WNL  Sensory/Perceptual disturbances:   WNL  Orientation: oriented to person, place, time/date, and situation  Attention: Good  Concentration: Good  Memory: WNL  Fund of knowledge:  Good  Insight:   Good  Judgment:  Good  Impulse Control: Good   Risk Assessment: Danger to Self:  No Self-injurious Behavior: No Danger to Others: No Duty to Warn:no Physical Aggression / Violence:No  Access to Firearms a concern: No  Gang Involvement:No   Subjective:  Pt present for face-to-face individual therapy via video Webex.  Pt consents to telehealth video session due to COVID 19 pandemic. Location of pt: home Location of therapist: home office.   Pt talked about things at home.   Issues are the same with his wife and son.   Addressed the interactions and helped pt process his feelings and relationship dynamics.  Worked on Radiographer, therapeutic.    Pt has been working on mindfulness and meditation. Provided supportive therapy.     Interventions: Cognitive Behavioral Therapy and Insight-Oriented  Diagnosis: F43.22   Plan of Care: Pt participated in setting treatment goals.   He is progressing toward goals.   Plan to continue to meet every two weeks.   Treatment Plan  (Treatment Plan Target Date 10/10/2022) Client Abilities/Strengths  Pt is bright, engaging and motivated for therapy.  Client Treatment Preferences  Individual therapy.  Client Statement of Needs  Improve coping skills.  Symptoms  Autonomic hyperactivity (e.g., palpitations, shortness of  breath, dry mouth, trouble swallowing, nausea, diarrhea). Excessive and/or unrealistic worry that is difficult to control occurring more days than not for at least 6 months about a number of events or activities. Hypervigilance (e.g., feeling constantly on edge, experiencing concentration difficulties, having trouble falling or staying asleep, exhibiting a general state of irritability). Motor tension (e.g., restlessness, tiredness, shakiness, muscle tension). Problems Addressed  Anxiety Goals 1. Enhance ability to effectively cope with the full variety of life's worries and anxieties. 2. Learn and implement coping skills that result in a reduction of anxiety and worry, and improved daily functioning. Objective Learn to accept limitations in life and commit to tolerating, rather than avoiding, unpleasant emotions while accomplishing meaningful goals. Target Date: 2022-10-10 Frequency: Biweekly Progress: 50 Modality: individual Related Interventions 1. Use techniques from Acceptance and Commitment Therapy to help client accept uncomfortable realities such as lack of complete control, imperfections, and uncertainty and tolerate unpleasant emotions and thoughts in order to accomplish value-consistent goals. Objective Learn and implement problem-solving strategies for realistically addressing worries. Target Date: 2022-10-10 Frequency: Biweekly Progress: 50 Modality: individual Related Interventions 1. Assign the client a homework exercise in which he/she problem-solves a current problem.  review, reinforce success, and provide corrective feedback toward improvement. 2. Teach the client problem-solving strategies involving specifically defining a problem, generating options for addressing it, evaluating the pros and cons of each option, selecting and implementing an optional action, and reevaluating and refining the action. Objective Learn and implement calming skills to reduce overall anxiety and  manage anxiety symptoms. Target Date: 2022-10-10 Frequency: Biweekly Progress: 50 Modality: individual Related  Interventions 1. Assign the client to read about progressive muscle relaxation and other calming strategies in relevant books or treatment manuals (e.g., Progressive Relaxation Training by Gwynneth Aliment and Dani Gobble; Mastery of Your Anxiety and Worry: Workbook by Beckie Busing). 2. Assign the client homework each session in which he/she practices relaxation exercises daily, gradually applying them progressively from non-anxiety-provoking to anxiety-provoking situations; review and reinforce success while providing corrective feedback toward improvement. 3. Teach the client calming/relaxation skills (e.g., applied relaxation, progressive muscle relaxation, cue controlled relaxation; mindful breathing; biofeedback) and how to discriminate better between relaxation and tension; teach the client how to apply these skills to his/her daily life. 3. Reduce overall frequency, intensity, and duration of the anxiety so that daily functioning is not impaired. 4. Resolve the core conflict that is the source of anxiety. 5. Stabilize anxiety level while increasing ability to function on a daily basis. Diagnosis :    F43.22 Conditions For Discharge Achievement of treatment goals and objectives.  Ravan Schlemmer, LCSW

## 2022-08-26 ENCOUNTER — Other Ambulatory Visit: Payer: Self-pay | Admitting: Cardiology

## 2022-08-27 ENCOUNTER — Ambulatory Visit (INDEPENDENT_AMBULATORY_CARE_PROVIDER_SITE_OTHER): Payer: Medicare Other | Admitting: Psychology

## 2022-08-27 DIAGNOSIS — F4322 Adjustment disorder with anxiety: Secondary | ICD-10-CM

## 2022-08-27 NOTE — Progress Notes (Signed)
[place] Counselor/Therapist Progress Note  Patient ID: TREVELLE MCGURN, MRN: [id number],    Date: 08/27/2022  Time Spent: 9:00am - 9:50am   50 minutes   Treatment Type: Individual Therapy  Reported Symptoms: stress  Mental Status Exam: Appearance:  Casual     Behavior: Appropriate  Motor: Normal  Speech/Language:  Normal Rate  Affect: Appropriate  Mood: normal  Thought process: normal  Thought content:   WNL  Sensory/Perceptual disturbances:   WNL  Orientation: oriented to person, place, time/date, and situation  Attention: Good  Concentration: Good  Memory: WNL  Fund of knowledge:  Good  Insight:   Good  Judgment:  Good  Impulse Control: Good   Risk Assessment: Danger to Self:  No Self-injurious Behavior: No Danger to Others: No Duty to Warn:no Physical Aggression / Violence:No  Access to Firearms a concern: No  Gang Involvement:No   Subjective:  Pt present for face-to-face individual therapy via video Webex.  Pt consents to telehealth video session due to COVID 19 pandemic. Location of pt: home Location of therapist: home office.   Pt talked about things at home.   Pt states he and his wife and son went to see a counselor last week.   They are going for family counseling.  Pt states his wife initiated the counseling so she and her son can try to understand each other better.   They also want their son to get help with his mental health and substance abuse issues.   They will work on communication issues and pt wants to work on dealing with his frustrations with his son and wife. Pt states he feels exhausted.   Helped pt process his feelings and family dynamics.  Worked on Radiographer, therapeutic.    Provided supportive therapy.     Interventions: Cognitive Behavioral Therapy and Insight-Oriented  Diagnosis: F43.22   Plan of Care: Pt participated in setting treatment goals.   He is progressing toward goals.   Plan to continue to meet every two  weeks.   Treatment Plan  (Treatment Plan Target Date 10/10/2022) Client Abilities/Strengths  Pt is bright, engaging and motivated for therapy.  Client Treatment Preferences  Individual therapy.  Client Statement of Needs  Improve coping skills.  Symptoms  Autonomic hyperactivity (e.g., palpitations, shortness of breath, dry mouth, trouble swallowing, nausea, diarrhea). Excessive and/or unrealistic worry that is difficult to control occurring more days than not for at least 6 months about a number of events or activities. Hypervigilance (e.g., feeling constantly on edge, experiencing concentration difficulties, having trouble falling or staying asleep, exhibiting a general state of irritability). Motor tension (e.g., restlessness, tiredness, shakiness, muscle tension). Problems Addressed  Anxiety Goals 1. Enhance ability to effectively cope with the full variety of life's worries and anxieties. 2. Learn and implement coping skills that result in a reduction of anxiety and worry, and improved daily functioning. Objective Learn to accept limitations in life and commit to tolerating, rather than avoiding, unpleasant emotions while accomplishing meaningful goals. Target Date: 2022-10-10 Frequency: Biweekly Progress: 50 Modality: individual Related Interventions 1. Use techniques from Acceptance and Commitment Therapy to help client accept uncomfortable realities such as lack of complete control, imperfections, and uncertainty and tolerate unpleasant emotions and thoughts in order to accomplish value-consistent goals. Objective Learn and implement problem-solving strategies for realistically addressing worries. Target Date: 2022-10-10 Frequency: Biweekly Progress: 50 Modality: individual Related Interventions 1. Assign the client a homework exercise in which he/she problem-solves a current problem.  review, reinforce  success, and provide corrective feedback toward improvement. 2. Teach the  client problem-solving strategies involving specifically defining a problem, generating options for addressing it, evaluating the pros and cons of each option, selecting and implementing an optional action, and reevaluating and refining the action. Objective Learn and implement calming skills to reduce overall anxiety and manage anxiety symptoms. Target Date: 2022-10-10 Frequency: Biweekly Progress: 50 Modality: individual Related Interventions 1. Assign the client to read about progressive muscle relaxation and other calming strategies in relevant books or treatment manuals (e.g., Progressive Relaxation Training by Gwynneth Aliment and Dani Gobble; Mastery of Your Anxiety and Worry: Workbook by Beckie Busing). 2. Assign the client homework each session in which he/she practices relaxation exercises daily, gradually applying them progressively from non-anxiety-provoking to anxiety-provoking situations; review and reinforce success while providing corrective feedback toward improvement. 3. Teach the client calming/relaxation skills (e.g., applied relaxation, progressive muscle relaxation, cue controlled relaxation; mindful breathing; biofeedback) and how to discriminate better between relaxation and tension; teach the client how to apply these skills to his/her daily life. 3. Reduce overall frequency, intensity, and duration of the anxiety so that daily functioning is not impaired. 4. Resolve the core conflict that is the source of anxiety. 5. Stabilize anxiety level while increasing ability to function on a daily basis. Diagnosis :    F43.22 Conditions For Discharge Achievement of treatment goals and objectives.  Heitor Steinhoff, LCSW

## 2022-08-29 ENCOUNTER — Ambulatory Visit (INDEPENDENT_AMBULATORY_CARE_PROVIDER_SITE_OTHER): Payer: Medicare Other | Admitting: *Deleted

## 2022-08-29 DIAGNOSIS — Z0000 Encounter for general adult medical examination without abnormal findings: Secondary | ICD-10-CM

## 2022-08-29 NOTE — Progress Notes (Signed)
Subjective:  Pt completed ADLs, Fall screen, SDOH during e-check on 08/28/22.  Answers verified with pt.    Gregory Wilkins is a 82 y.o. male who presents for Medicare Annual/Subsequent preventive examination.  I connected with  Gregory Wilkins on 08/29/22 by a audio enabled telemedicine application and verified that I am speaking with the correct person using two identifiers.  Patient Location: Home  Provider Location: Office/Clinic  I discussed the limitations of evaluation and management by telemedicine. The patient expressed understanding and agreed to proceed.   Review of Systems    Defer to PCP Cardiac Risk Factors include: advanced age (>60mn, >>38women);male gender;dyslipidemia;hypertension     Objective:    There were no vitals filed for this visit. There is no height or weight on file to calculate BMI.     08/29/2022    1:04 PM 08/06/2021    1:52 PM 05/17/2020    7:52 PM 03/04/2020    9:16 PM 05/16/2019    4:41 PM 12/17/2018    6:48 PM 05/18/2013    1:47 PM  Advanced Directives  Does Patient Have a Medical Advance Directive? Yes Yes No Yes Yes Yes Patient has advance directive, copy not in chart  Type of Advance Directive Healthcare Power of [place] Living will;Healthcare Power of Attorney   Does patient want to make changes to medical advance directive? No - Patient declined        Copy of HCol[place]in Chart? No - copy requested No - copy requested       Would patient like information on creating a medical advance directive?   No - Patient declined        Current Medications (verified) Outpatient Encounter Medications as of 08/29/2022  Medication Sig   cholecalciferol (VITAMIN D3) 25 MCG (1000 UNIT) tablet Take 1,000 Units by mouth daily.   aspirin EC 81 MG tablet Take 1 tablet (81 mg total) by mouth daily. Swallow whole.   atorvastatin (LIPITOR) 10 MG tablet Take 1  tablet (10 mg total) by mouth at bedtime.   cetirizine (ZYRTEC) 10 MG tablet Take 10 mg by mouth daily.   clonazePAM (KLONOPIN) 0.5 MG tablet TAKE 1 TABLET BY MOUTH EVERY NIGHT AT BEDTIME AS NEEDED FOR RESTLESS LEG   ELIQUIS 5 MG TABS tablet TAKE 1 TABLET BY MOUTH TWICE A DAY   hydrochlorothiazide (HYDRODIURIL) 12.5 MG tablet Take 1 tablet (12.5 mg total) by mouth daily.   losartan (COZAAR) 25 MG tablet Take 1 tablet (25 mg total) by mouth daily.   metoprolol tartrate (LOPRESSOR) 50 MG tablet Take 12.5 mg by mouth as needed for heart rate control (fast heart rate).   Multiple Vitamin (MULTIVITAMIN WITH MINERALS) TABS tablet Take 1 tablet by mouth daily.   Multiple Vitamins-Minerals (ICAPS AREDS 2 PO) Take 1 tablet by mouth daily in the afternoon.   No facility-administered encounter medications on file as of 08/29/2022.    Allergies (verified) Penicillins and Ramipril   History: Past Medical History:  Diagnosis Date   Acne rosacea 12/23/2006   Qualifier: Diagnosis of  By: HLinna DarnerMD, WGwyndolyn Wilkins  Dr HCrista Wilkins  Formatting of this note might be different from the original. Overview:  Qualifier: Diagnosis of  By: HLinna DarnerMD, WGwyndolyn Wilkins  Dr HCrista LuriaOverview:  Overview:  Qualifier: Diagnosis of  By: HLinna DarnerMD, WGwyndolyn Wilkins  Dr HCrista Wilkins Overview:  Qualifier: Diagnosis of  By: Linna Darner MD, Gregory Wilkins   Dr Gregory Wilkins Overview:  Overview:  Overview:   Acute meniscal tear, lateral 10/01/2021   Alcohol abuse    Allergic rhinitis 12/23/2006   Qualifier: Diagnosis of  By: Linna Darner MD, Gregory Wilkins   Onset:in high school Character: perennial but increased seasonally Triggers :cat, dust, pollen Allergy testing: Dr Gregory Wilkins Maintenance medications/ response:Flonase/ Nasonex; saline wash; Allegra Smoking history:age 13- 44, up to 2 ppd Family history pulmonary disease: no    Formatting of this note might be different from the original. 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 Overview:  Qualifier: Diagnosis of  By: Linna Darner MD, Gregory Wilkins of this note might be different from the original. Overview:  Qualifier: Diagnosis of  By: Linna Darner MD, Gregory Wilkins  IMO 10/01 Updates   Enlarged prostate without lower urinary tract symptoms (luts) 11/09/2008   Formatting of this note might be different from the original. Overview:  Qualifier: Diagnosis of  By: Linna Darner MD, Gregory Wilkins   No PMH of elevated PSA ; no biopsy Overview:  Overview:  Qualifier: Diagnosis of  By: Linna Darner MD, Gregory Wilkins   No PMH of elevated PSA ; no biopsy Overview:  Overview:  Overview:  Qualifier: Diagnosis of  By: Linna Darner MD, Gregory Wilkins   No PMH of elevated PSA ; no biopsy   Essential (primary) hypertension 09/21/2007   Qualifier: Diagnosis of  By: Linna Darner MD, Gregory Wilkins of this note might be different from the original. Overview:  Qualifier: Diagnosis of  By: Linna Darner MD, [place]:  Blood pressure appears to be well controlled. Renal function will be checked.  He has a minor cough; this does not appear to be significant enough to warrant change to losartan Overview:  Overview:  Qu   Family history of type C viral hepatitis 06/30/2012   Brother had liver transplant    Hepatitis A 04/2019   History of colonic  polyps 12/23/2006   Qualifier: Diagnosis of  By: Linna Darner MD, Gregory Wilkins   Dr Earlie Raveling, GI Polypectomy X2 , 4/16 /2014. One was  transitional cell adenoma Due annually No FH colon cancer   Formatting of this note might be different from the original. Overview:  Qualifier: Diagnosis of  By: Linna Darner MD, Gregory Wilkins   Dr Earlie Raveling, GI Polypectomy X2 , 4/16 /2014. One was  transitional cell adenoma Due annually No FH colon ca   History of urinary stone 09/21/2007   Qualifier: Diagnosis of  By: Linna Darner MD, Jessie Foot , passed spontaneously No Urologist involvement   Formatting of this note might be different from the original.  Overview:  Qualifier: Diagnosis of  By: Linna Darner MD, Jessie Foot , passed spontaneously No Urologist involvement Overview:  Overview:  Qualifier: Diagnosis of  By: Linna Darner MD, Jessie Foot , passed spontaneously No Urologist involvement    HYPERLIPIDEMIA 09/21/2007   Qualifier: Diagnosis of  By: Linna Darner MD, Cristopher Estimable Lipoprofile 2006 : LDL  108 ( 1072  / 575  ),  HDL 63 , Triglycerides  40. LDL goal = < 135 ; ideally < 105. No FH CAD   Formatting of this note might be different from the original. Overview:  Qualifier: Diagnosis of  By: Linna Darner MD, Cristopher Estimable Lipoprofile 2006 : LDL  108 ( 1072  / 575  ),  HDL 63 , Triglycerides  40. LDL goal = < 135 ; idea   Hypertension    Joint pain    Liver injury, initial encounter 05/17/2019   Macular degeneration 01/06/2018   Malignant neoplasm of skin    Mallet finger 2012     4th L DIP,Dr Sypher   Mixed dyslipidemia 03/23/2020   Nephrolithiasis    Nocturnal hypoxia 11/04/2019   Nuclear sclerotic cataract of both eyes    Obesity (BMI 30-39.9) 01/04/2014   OSA (obstructive sleep apnea) 05/17/2020   Osteoarthritis    Overweight 03/23/2020   PCP NOTES >>>>>>>>>>>>>>>> 05/26/2019   Personal history of other malignant neoplasm of skin 06/30/2012   Dr Wilhemina Bonito, Derm X3 Local resection from  right forearm and forehead.   Mohs surgery of the  nose 06/2012, Dr Verlon Au of this note might be different from the original. Overview:  Dr Wilhemina Bonito, Derm X3 Local resection from  right forearm and forehead.   Mohs surgery of the nose 06/2012, Dr Sarajane Jews Overview:  Overview:  Dr Wilhemina Bonito, Derm X3 Local resection from  right forearm and fo   RLS (restless legs syndrome)    Sleep apnea    Snoring 02/01/2014   Formatting of this note might be different from the original. Last Assessment & Plan:  The patient has been noted to have significant snoring, but no one has ever really commented on an abnormal breathing pattern during sleep. Although he is overweight and does have restless sleep, the rest of his history is not overly impressive for sleep disordered breathing. If he does have sleep apnea, I suspe   Past Surgical History:  Procedure Laterality Date   basal cell cancer     X 3   INGUINAL HERNIA REPAIR  [age]   right,left   OPEN REDUCTION INTERNAL FIXATION (ORIF) METACARPAL Right 05/19/2013   Procedure: OPEN REDUCTION INTERNAL FIXATION  RIGHT SMALL  METACARPAL FRACTURE;  Surgeon: Cammie Sickle., MD;  Location: [facility];  Service: Orthopedics;  Laterality: Right;   POLYPECTOMY     X 3; Dr Earlean Shawl   WISDOM TOOTH EXTRACTION     Family History  Problem Relation Age of Onset   Diabetes Father    Kidney disease Father    Stroke Mother 51   Hypertension Mother    Alcohol abuse Mother    Obesity Mother    Heart disease Neg Hx    Cancer Neg Hx    Social History   Socioeconomic History   Marital status: Married    Spouse name: Butch Penny    Number of children: Not on file   Years of education: Not on file   Highest education level: Not on file  Occupational  History   Occupation: retired---Financial Analyst  Tobacco Use   Smoking status: Former    Packs/day: 2.00    Years: 26.00    Total pack years: 52.00    Types: Cigarettes    Quit date: 07/29/1982    Years since quitting: 40.1   Smokeless  tobacco: Never   Tobacco comments:    ages [age], up to 2 ppd  Vaping Use   Vaping Use: Never used  Substance and Sexual Activity   Alcohol use: No    Alcohol/week: 0.0 standard drinks of alcohol   Drug use: No   Sexual activity: Not on file  Other Topics Concern   Not on file  Social History Narrative   Household: pt, wife, child   Social Determinants of Health   Financial Resource Strain: Low Risk  (08/28/2022)   Overall Financial Resource Strain (CARDIA)    Difficulty of Paying Living Expenses: Not hard at all  Food Insecurity: No Food Insecurity (08/28/2022)   Hunger Vital Sign    Worried About Running Out of Food in the Last Year: Never true    [place] in the Last Year: Never true  Transportation Needs: No Transportation Needs (08/28/2022)   PRAPARE - Hydrologist (Medical): No    Lack of Transportation (Non-Medical): No  Physical Activity: Sufficiently Active (08/28/2022)   Exercise Vital Sign    Days of Exercise per Week: 7 days    Minutes of Exercise per Session: 60 min  Stress: No Stress Concern Present (08/28/2022)   [place]    Feeling of Stress : Only a little  Social Connections: Socially Isolated (08/28/2022)   Social Connection and Isolation Panel [NHANES]    Frequency of Communication with Friends and Family: Never    Frequency of Social Gatherings with Friends and Family: Never    Attends Religious Services: Never    Marine scientist or Organizations: No    Attends Music therapist: Never    Marital Status: Married    Tobacco Counseling Counseling given: Not Answered Tobacco comments: ages 69-40, up to 2 ppd   Clinical Intake:  Pre-visit preparation completed: Yes  Pain : No/denies pain  Diabetes: No  How often do you need to have someone help you when you read instructions, pamphlets, or other written materials from your doctor or  pharmacy?: 1 - Never   Activities of Daily Living    08/28/2022    2:20 PM  In your present state of health, do you have any difficulty performing the following activities:  Hearing? 0  Vision? 0  Difficulty concentrating or making decisions? 0  Walking or climbing stairs? 0  Dressing or bathing? 0  Doing errands, shopping? 0  Preparing Food and eating ? N  Using the Toilet? N  In the past six months, have you accidently leaked urine? Y  Do you have problems with loss of bowel control? Y  Managing your Medications? N  Managing your Finances? N  Housekeeping or managing your Housekeeping? N    Patient Care Team: Colon Branch, MD as PCP - General (Internal Medicine)  Indicate any recent Medical Services you may have received from other than [facility] providers in the past year (date may be approximate).     Assessment:   This is a routine wellness examination for Rande.  Hearing/Vision screen No results found.  Dietary issues and exercise activities discussed: Current  Exercise Habits: Home exercise routine, Type of exercise: walking;yoga;calisthenics (stationary bike, [place] Chi, mat pilates), Time (Minutes): 60, Frequency (Times/Week): 7, Weekly Exercise (Minutes/Week): 420, Intensity: Moderate, Exercise limited by: None identified   Goals Addressed   None    Depression Screen    08/29/2022    1:09 PM 02/25/2022    8:00 AM 08/06/2021    1:50 PM 02/26/2021    7:58 AM 09/17/2020   10:32 AM 08/28/2020    8:29 AM 05/16/2019    3:44 PM  PHQ 2/9 Scores  PHQ - 2 Score 0 0 0 0 0 2 0  PHQ- 9 Score     4 5     Fall Risk    08/28/2022    2:20 PM 02/25/2022    8:00 AM 08/06/2021    1:54 PM 02/26/2021    7:58 AM 08/28/2020    8:00 AM  Fall Risk   Falls in the past year? 0 0 0 0 0  Number falls in past yr: 0 0 0 0 0  Injury with Fall? 0 0 0 0 0  Risk for fall due to : No Fall Risks  Impaired vision    Follow up Falls evaluation completed Falls evaluation completed Falls prevention discussed  Falls evaluation completed     FALL RISK PREVENTION PERTAINING TO THE HOME:  Any stairs in or around the home? Yes  If so, are there any without handrails? No  Home free of loose throw rugs in walkways, pet beds, electrical cords, etc? Yes  Adequate lighting in your home to reduce risk of falls? Yes   ASSISTIVE DEVICES UTILIZED TO PREVENT FALLS:  Life alert? No  Use of a cane, walker or w/c? No  Grab bars in the bathroom? No  Shower chair or bench in shower? No  Elevated toilet seat or a handicapped toilet?  Comfort height  TIMED UP AND GO:  Was the test performed?  No, audio visit .   Cognitive Function:        08/29/2022    1:12 PM 08/06/2021    1:58 PM  6CIT Screen  What Year? 0 points 0 points  What month? 0 points 0 points  What time? 0 points 0 points  Count back from 20 0 points 0 points  Months in reverse 0 points 0 points  Repeat phrase 0 points 0 points  Total Score 0 points 0 points    Immunizations Immunization History  Administered Date(s) Administered   Fluad Quad(high Dose 65+) 05/15/2021, 05/06/2022   Influenza Split 05/28/2013   Influenza, High Dose Seasonal PF 06/02/2014, 04/08/2018, 03/07/2019, 04/17/2020   Influenza, Seasonal, Injecte, Preservative Fre 06/30/2012   Influenza,inj,Quad PF,6+ Mos 04/19/2015   PFIZER Comirnaty(Gray Top)Covid-19 Tri-Sucrose Vaccine 11/21/2020, 05/06/2022   PFIZER(Purple Top)SARS-COV-2 Vaccination 08/10/2019, 08/30/2019, 04/24/2020   Pfizer Covid-19 Vaccine Bivalent Booster 70yr & up 07/05/2021, 01/30/2022   Pneumococcal Conjugate-13 04/29/2016   Pneumococcal Polysaccharide-23 05/09/2010, 08/28/2020   Td 12/26/2005, 01/09/2011   Tdap 02/26/2021   Zoster Recombinat (Shingrix) 08/26/2018, 03/07/2019   Zoster, Live 11/09/2008    Pneumococcal vaccine status: Up to date  Flu Vaccine status: Up to date  Pneumococcal vaccine status: Up to date  Covid-19 vaccine status: Information provided on how to obtain  vaccines.   Qualifies for Shingles Vaccine? Yes   Zostavax completed Yes   Shingrix Completed?: Yes  Screening Tests Health Maintenance  Topic Date Due   COVID-19 Vaccine (8 - 2023-24 season) 07/01/2022   Medicare Annual Wellness (AWV)  08/06/2022   DTaP/Tdap/Td (4 - Td or Tdap) 02/27/2031   Pneumonia Vaccine 80+ Years old  Completed   INFLUENZA VACCINE  Completed   Hepatitis C Screening  Completed   Zoster Vaccines- Shingrix  Completed   HPV VACCINES  Aged Out   COLONOSCOPY (Pts 45-49yr Insurance coverage will need to be confirmed)  [place] Due  Topic Date Due   COVID-19 Vaccine (8 - 2023-24 season) 07/01/2022   Medicare Annual Wellness (AWV)  08/06/2022    Colorectal cancer screening: Type of screening: Colonoscopy. Completed 02/07/14. Repeat every N/a years  Lung Cancer Screening: (Low Dose CT Chest recommended if Age 80-80years, 30 pack-year currently smoking OR have quit w/in 15years.) does not qualify.   Additional Screening:  Hepatitis C Screening: does qualify; Completed 05/16/19  Vision Screening: Recommended annual ophthalmology exams for early detection of glaucoma and other disorders of the eye. Is the patient up to date with their annual eye exam?  Yes  Who is the provider or what is the name of the office in which the patient attends annual eye exams? Dr. TSatira SarkIf pt is not established with a provider, would they like to be referred to a provider to establish care? No .   Dental Screening: Recommended annual dental exams for proper oral hygiene  Community Resource Referral / Chronic Care Management: CRR required this visit?  No   CCM required this visit?  No      Plan:     I have personally reviewed and noted the following in the patient's chart:   Medical and social history Use of alcohol, tobacco or illicit drugs  Current medications and supplements including opioid prescriptions. Patient is not  currently taking opioid prescriptions. Functional ability and status Nutritional status Physical activity Advanced directives List of other physicians Hospitalizations, surgeries, and ER visits in previous 12 months Vitals Screenings to include cognitive, depression, and falls Referrals and appointments  In addition, I have reviewed and discussed with patient certain preventive protocols, quality metrics, and best practice recommendations. A written personalized care plan for preventive services as well as general preventive health recommendations were provided to patient.   Due to this being a telephonic visit, the after visit summary with patients personalized plan was offered to patient via mail or my-chart. Patient would like to access on my-chart.  BBeatris Ship [place]  08/29/2022   Nurse Notes: None

## 2022-08-29 NOTE — Patient Instructions (Signed)
[name] , Thank you for taking time to come for your Medicare Wellness Visit. I appreciate your ongoing commitment to your health goals. Please review the following plan we discussed and let me know if I can assist you in the future.   These are the goals we discussed:  Goals      Patient Stated     Lose weight         This is a list of the screening recommended for you and due dates:  Health Maintenance  Topic Date Due   COVID-19 Vaccine (8 - 2023-24 season) 07/01/2022   Medicare Annual Wellness Visit  08/30/2023   DTaP/Tdap/Td vaccine (4 - Td or Tdap) 02/27/2031   Pneumonia Vaccine  Completed   Flu Shot  Completed   Hepatitis C Screening: USPSTF Recommendation to screen - Ages 18-79 yo.  Completed   Zoster (Shingles) Vaccine  Completed   HPV Vaccine  Aged Out   Colon Cancer Screening  Discontinued     Next appointment: Follow up in one year for your annual wellness visit.   Preventive Care 48 Years and Older, Male Preventive care refers to lifestyle choices and visits with your health care provider that can promote health and wellness. What does preventive care include? A yearly physical exam. This is also called an annual well check. Dental exams once or twice a year. Routine eye exams. Ask your health care provider how often you should have your eyes checked. Personal lifestyle choices, including: Daily care of your teeth and gums. Regular physical activity. Eating a healthy diet. Avoiding tobacco and drug use. Limiting alcohol use. Practicing safe sex. Taking low doses of aspirin every day. Taking vitamin and mineral supplements as recommended by your health care provider. What happens during an annual well check? The services and screenings done by your health care provider during your annual well check will depend on your age, overall health, lifestyle risk factors, and family history of disease. Counseling  Your health care provider may ask you questions  about your: Alcohol use. Tobacco use. Drug use. Emotional well-being. Home and relationship well-being. Sexual activity. Eating habits. History of falls. Memory and ability to understand (cognition). Work and work Statistician. Screening  You may have the following tests or measurements: Height, weight, and BMI. Blood pressure. Lipid and cholesterol levels. These may be checked every 5 years, or more frequently if you are over 58 years old. Skin check. Lung cancer screening. You may have this screening every year starting at age 11 if you have a 30-pack-year history of smoking and currently smoke or have quit within the past 15 years. Fecal occult blood test (FOBT) of the stool. You may have this test every year starting at age 71. Flexible sigmoidoscopy or colonoscopy. You may have a sigmoidoscopy every 5 years or a colonoscopy every 10 years starting at age 54. Prostate cancer screening. Recommendations will vary depending on your family history and other risks. Hepatitis C blood test. Hepatitis B blood test. Sexually transmitted disease (STD) testing. Diabetes screening. This is done by checking your blood sugar (glucose) after you have not eaten for a while (fasting). You may have this done every 1-3 years. Abdominal aortic aneurysm (AAA) screening. You may need this if you are a current or former smoker. Osteoporosis. You may be screened starting at age 74 if you are at high risk. Talk with your health care provider about your test results, treatment options, and if necessary, the need for more tests.  Vaccines  Your health care provider may recommend certain vaccines, such as: Influenza vaccine. This is recommended every year. Tetanus, diphtheria, and acellular pertussis (Tdap, Td) vaccine. You may need a Td booster every 10 years. Zoster vaccine. You may need this after age 5. Pneumococcal 13-valent conjugate (PCV13) vaccine. One dose is recommended after age 30. Pneumococcal  polysaccharide (PPSV23) vaccine. One dose is recommended after age 51. Talk to your health care provider about which screenings and vaccines you need and how often you need them. This information is not intended to replace advice given to you by your health care provider. Make sure you discuss any questions you have with your health care provider. Document Released: 08/10/2015 Document Revised: 04/02/2016 Document Reviewed: 05/15/2015 Elsevier Interactive Patient Education  2017 [place] Prevention in the Home Falls can cause injuries. They can happen to people of all ages. There are many things you can do to make your home safe and to help prevent falls. What can I do on the outside of my home? Regularly fix the edges of walkways and driveways and fix any cracks. Remove anything that might make you trip as you walk through a door, such as a raised step or threshold. Trim any bushes or trees on the path to your home. Use bright outdoor lighting. Clear any walking paths of anything that might make someone trip, such as rocks or tools. Regularly check to see if handrails are loose or broken. Make sure that both sides of any steps have handrails. Any raised decks and porches should have guardrails on the edges. Have any leaves, snow, or ice cleared regularly. Use sand or salt on walking paths during winter. Clean up any spills in your garage right away. This includes oil or grease spills. What can I do in the bathroom? Use night lights. Install grab bars by the toilet and in the tub and shower. Do not use towel bars as grab bars. Use non-skid mats or decals in the tub or shower. If you need to sit down in the shower, use a plastic, non-slip stool. Keep the floor dry. Clean up any water that spills on the floor as soon as it happens. Remove soap buildup in the tub or shower regularly. Attach bath mats securely with double-sided non-slip rug tape. Do not have throw rugs and other  things on the floor that can make you trip. What can I do in the bedroom? Use night lights. Make sure that you have a light by your bed that is easy to reach. Do not use any sheets or blankets that are too big for your bed. They should not hang down onto the floor. Have a firm chair that has side arms. You can use this for support while you get dressed. Do not have throw rugs and other things on the floor that can make you trip. What can I do in the kitchen? Clean up any spills right away. Avoid walking on wet floors. Keep items that you use a lot in easy-to-reach places. If you need to reach something above you, use a strong step stool that has a grab bar. Keep electrical cords out of the way. Do not use floor polish or wax that makes floors slippery. If you must use wax, use non-skid floor wax. Do not have throw rugs and other things on the floor that can make you trip. What can I do with my stairs? Do not leave any items on the stairs. Make sure that  there are handrails on both sides of the stairs and use them. Fix handrails that are broken or loose. Make sure that handrails are as long as the stairways. Check any carpeting to make sure that it is firmly attached to the stairs. Fix any carpet that is loose or worn. Avoid having throw rugs at the top or bottom of the stairs. If you do have throw rugs, attach them to the floor with carpet tape. Make sure that you have a light switch at the top of the stairs and the bottom of the stairs. If you do not have them, ask someone to add them for you. What else can I do to help prevent falls? Wear shoes that: Do not have high heels. Have rubber bottoms. Are comfortable and fit you well. Are closed at the toe. Do not wear sandals. If you use a stepladder: Make sure that it is fully opened. Do not climb a closed stepladder. Make sure that both sides of the stepladder are locked into place. Ask someone to hold it for you, if possible. Clearly  mark and make sure that you can see: Any grab bars or handrails. First and last steps. Where the edge of each step is. Use tools that help you move around (mobility aids) if they are needed. These include: Canes. Walkers. Scooters. Crutches. Turn on the lights when you go into a dark area. Replace any light bulbs as soon as they burn out. Set up your furniture so you have a clear path. Avoid moving your furniture around. If any of your floors are uneven, fix them. If there are any pets around you, be aware of where they are. Review your medicines with your doctor. Some medicines can make you feel dizzy. This can increase your chance of falling. Ask your doctor what other things that you can do to help prevent falls. This information is not intended to replace advice given to you by your health care provider. Make sure you discuss any questions you have with your health care provider. Document Released: 05/10/2009 Document Revised: 12/20/2015 Document Reviewed: 08/18/2014 Elsevier Interactive Patient Education  2017 [employer].

## 2022-08-29 NOTE — Progress Notes (Signed)
I have reviewed and agree with [facility] documentation.  Kathlene November, MD

## 2022-09-02 ENCOUNTER — Ambulatory Visit: Payer: Medicare Other | Admitting: Internal Medicine

## 2022-09-10 ENCOUNTER — Ambulatory Visit (INDEPENDENT_AMBULATORY_CARE_PROVIDER_SITE_OTHER): Payer: Medicare Other | Admitting: Psychology

## 2022-09-10 DIAGNOSIS — F4322 Adjustment disorder with anxiety: Secondary | ICD-10-CM

## 2022-09-10 NOTE — Progress Notes (Signed)
[place] Counselor/Therapist Progress Note  Patient ID: Gregory Wilkins, MRN: [id number],    Date: 09/10/2022  Time Spent: 9:00am - 9:50am   50 minutes   Treatment Type: Individual Therapy  Reported Symptoms: stress  Mental Status Exam: Appearance:  Casual     Behavior: Appropriate  Motor: Normal  Speech/Language:  Normal Rate  Affect: Appropriate  Mood: normal  Thought process: normal  Thought content:   WNL  Sensory/Perceptual disturbances:   WNL  Orientation: oriented to person, place, time/date, and situation  Attention: Good  Concentration: Good  Memory: WNL  Fund of knowledge:  Good  Insight:   Good  Judgment:  Good  Impulse Control: Good   Risk Assessment: Danger to Self:  No Self-injurious Behavior: No Danger to Others: No Duty to Warn:no Physical Aggression / Violence:No  Access to Firearms a concern: No  Gang Involvement:No   Subjective:  Pt present for face-to-face individual therapy via video Webex.  Pt consents to telehealth video session due to COVID 19 pandemic. Location of pt: home Location of therapist: home office.   Pt talked about his relationship with his wife.   They continue to go to family therapy with their son.   Pt talked about the positive things about his wife that he appreciates.   Acknowledged that most of their conflict is bc of their adult son's substance abuse and mental health issues and that he still lives with them.   Helped pt process his feelings and family dynamics.  Worked on Radiographer, therapeutic.    Provided supportive therapy.     Interventions: Cognitive Behavioral Therapy and Insight-Oriented  Diagnosis: F43.22   Plan of Care: Pt participated in setting treatment goals.   He is progressing toward goals.   Plan to continue to meet every two weeks.   Treatment Plan  (Treatment Plan Target Date 10/10/2022) Client Abilities/Strengths  Pt is bright, engaging and motivated for therapy.  Client Treatment  Preferences  Individual therapy.  Client Statement of Needs  Improve coping skills.  Symptoms  Autonomic hyperactivity (e.g., palpitations, shortness of breath, dry mouth, trouble swallowing, nausea, diarrhea). Excessive and/or unrealistic worry that is difficult to control occurring more days than not for at least 6 months about a number of events or activities. Hypervigilance (e.g., feeling constantly on edge, experiencing concentration difficulties, having trouble falling or staying asleep, exhibiting a general state of irritability). Motor tension (e.g., restlessness, tiredness, shakiness, muscle tension). Problems Addressed  Anxiety Goals 1. Enhance ability to effectively cope with the full variety of life's worries and anxieties. 2. Learn and implement coping skills that result in a reduction of anxiety and worry, and improved daily functioning. Objective Learn to accept limitations in life and commit to tolerating, rather than avoiding, unpleasant emotions while accomplishing meaningful goals. Target Date: 2022-10-10 Frequency: Biweekly Progress: 50 Modality: individual Related Interventions 1. Use techniques from Acceptance and Commitment Therapy to help client accept uncomfortable realities such as lack of complete control, imperfections, and uncertainty and tolerate unpleasant emotions and thoughts in order to accomplish value-consistent goals. Objective Learn and implement problem-solving strategies for realistically addressing worries. Target Date: 2022-10-10 Frequency: Biweekly Progress: 50 Modality: individual Related Interventions 1. Assign the client a homework exercise in which he/she problem-solves a current problem.  review, reinforce success, and provide corrective feedback toward improvement. 2. Teach the client problem-solving strategies involving specifically defining a problem, generating options for addressing it, evaluating the pros and cons of each option, selecting  and implementing an  optional action, and reevaluating and refining the action. Objective Learn and implement calming skills to reduce overall anxiety and manage anxiety symptoms. Target Date: 2022-10-10 Frequency: Biweekly Progress: 50 Modality: individual Related Interventions 1. Assign the client to read about progressive muscle relaxation and other calming strategies in relevant books or treatment manuals (e.g., Progressive Relaxation Training by Gwynneth Aliment and Dani Gobble; Mastery of Your Anxiety and Worry: Workbook by Beckie Busing). 2. Assign the client homework each session in which he/she practices relaxation exercises daily, gradually applying them progressively from non-anxiety-provoking to anxiety-provoking situations; review and reinforce success while providing corrective feedback toward improvement. 3. Teach the client calming/relaxation skills (e.g., applied relaxation, progressive muscle relaxation, cue controlled relaxation; mindful breathing; biofeedback) and how to discriminate better between relaxation and tension; teach the client how to apply these skills to his/her daily life. 3. Reduce overall frequency, intensity, and duration of the anxiety so that daily functioning is not impaired. 4. Resolve the core conflict that is the source of anxiety. 5. Stabilize anxiety level while increasing ability to function on a daily basis. Diagnosis :    F43.22 Conditions For Discharge Achievement of treatment goals and objectives.  Destony Prevost, LCSW

## 2022-09-17 ENCOUNTER — Other Ambulatory Visit: Payer: Self-pay | Admitting: Cardiology

## 2022-09-17 DIAGNOSIS — I4892 Unspecified atrial flutter: Secondary | ICD-10-CM

## 2022-09-18 NOTE — Telephone Encounter (Signed)
Prescription refill request for Eliquis received. Indication: Afib  Last office visit: 11/19/21 (Revankar) Scr: 1.09 (02/25/22)  Age: 82 Weight: 91.3kg  Appropriate dose. Refill sent.

## 2022-09-24 ENCOUNTER — Ambulatory Visit (INDEPENDENT_AMBULATORY_CARE_PROVIDER_SITE_OTHER): Payer: Medicare Other | Admitting: Psychology

## 2022-09-24 DIAGNOSIS — F4322 Adjustment disorder with anxiety: Secondary | ICD-10-CM | POA: Diagnosis not present

## 2022-09-24 NOTE — Progress Notes (Signed)
[facility] [place] Counselor/Therapist Progress Note  Patient ID: ESSEX HABA, MRN: [id number],    Date: 09/24/2022  Time Spent: 9:00am - 9:50am   50 minutes   Treatment Type: Individual Therapy  Reported Symptoms: stress  Mental Status Exam: Appearance:  Casual     Behavior: Appropriate  Motor: Normal  Speech/Language:  Normal Rate  Affect: Appropriate  Mood: normal  Thought process: normal  Thought content:   WNL  Sensory/Perceptual disturbances:   WNL  Orientation: oriented to person, place, time/date, and situation  Attention: Good  Concentration: Good  Memory: WNL  Fund of knowledge:  Good  Insight:   Good  Judgment:  Good  Impulse Control: Good   Risk Assessment: Danger to Self:  No Self-injurious Behavior: No Danger to Others: No Duty to Warn:no Physical Aggression / Violence:No  Access to Firearms a concern: No  Gang Involvement:No   Subjective:  Pt present for face-to-face individual therapy via video Webex.  Pt consents to telehealth video session due to COVID 19 pandemic. Location of pt: home Location of therapist: home office.   Pt talked about his relationship with his wife and son.   They had another family therapy session that went very well.  They were able to communicate with each other better and without conflict.   They have had a family meeting on their own as well that went well.   Pt has realized if he tries to interact the way he did in his work setting he is able to be less emotional and reactive.  Helped pt process his feelings and family dynamics.  Worked on Radiographer, therapeutic.    Provided supportive therapy.     Interventions: Cognitive Behavioral Therapy and Insight-Oriented  Diagnosis: F43.22   Plan of Care: Pt participated in setting treatment goals.   He is progressing toward goals.   Plan to continue to meet every two weeks.   Treatment Plan  (Treatment Plan Target Date 10/10/2022) Client Abilities/Strengths  Pt is  bright, engaging and motivated for therapy.  Client Treatment Preferences  Individual therapy.  Client Statement of Needs  Improve coping skills.  Symptoms  Autonomic hyperactivity (e.g., palpitations, shortness of breath, dry mouth, trouble swallowing, nausea, diarrhea). Excessive and/or unrealistic worry that is difficult to control occurring more days than not for at least 6 months about a number of events or activities. Hypervigilance (e.g., feeling constantly on edge, experiencing concentration difficulties, having trouble falling or staying asleep, exhibiting a general state of irritability). Motor tension (e.g., restlessness, tiredness, shakiness, muscle tension). Problems Addressed  Anxiety Goals 1. Enhance ability to effectively cope with the full variety of life's worries and anxieties. 2. Learn and implement coping skills that result in a reduction of anxiety and worry, and improved daily functioning. Objective Learn to accept limitations in life and commit to tolerating, rather than avoiding, unpleasant emotions while accomplishing meaningful goals. Target Date: 2022-10-10 Frequency: Biweekly Progress: 50 Modality: individual Related Interventions 1. Use techniques from Acceptance and Commitment Therapy to help client accept uncomfortable realities such as lack of complete control, imperfections, and uncertainty and tolerate unpleasant emotions and thoughts in order to accomplish value-consistent goals. Objective Learn and implement problem-solving strategies for realistically addressing worries. Target Date: 2022-10-10 Frequency: Biweekly Progress: 50 Modality: individual Related Interventions 1. Assign the client a homework exercise in which he/she problem-solves a current problem.  review, reinforce success, and provide corrective feedback toward improvement. 2. Teach the client problem-solving strategies involving specifically defining a problem, generating options  for  addressing it, evaluating the pros and cons of each option, selecting and implementing an optional action, and reevaluating and refining the action. Objective Learn and implement calming skills to reduce overall anxiety and manage anxiety symptoms. Target Date: 2022-10-10 Frequency: Biweekly Progress: 50 Modality: individual Related Interventions 1. Assign the client to read about progressive muscle relaxation and other calming strategies in relevant books or treatment manuals (e.g., Progressive Relaxation Training by Gwynneth Aliment and Dani Gobble; Mastery of Your Anxiety and Worry: Workbook by Beckie Busing). 2. Assign the client homework each session in which he/she practices relaxation exercises daily, gradually applying them progressively from non-anxiety-provoking to anxiety-provoking situations; review and reinforce success while providing corrective feedback toward improvement. 3. Teach the client calming/relaxation skills (e.g., applied relaxation, progressive muscle relaxation, cue controlled relaxation; mindful breathing; biofeedback) and how to discriminate better between relaxation and tension; teach the client how to apply these skills to his/her daily life. 3. Reduce overall frequency, intensity, and duration of the anxiety so that daily functioning is not impaired. 4. Resolve the core conflict that is the source of anxiety. 5. Stabilize anxiety level while increasing ability to function on a daily basis. Diagnosis :    F43.22 Conditions For Discharge Achievement of treatment goals and objectives.  Tyreonna Czaplicki, LCSW

## 2022-10-07 ENCOUNTER — Encounter: Payer: Self-pay | Admitting: Internal Medicine

## 2022-10-07 ENCOUNTER — Ambulatory Visit (INDEPENDENT_AMBULATORY_CARE_PROVIDER_SITE_OTHER): Payer: Medicare Other | Admitting: Internal Medicine

## 2022-10-07 VITALS — BP 128/80 | HR 62 | Temp 97.7°F | Resp 16 | Ht 69.0 in | Wt 196.2 lb

## 2022-10-07 DIAGNOSIS — K7460 Unspecified cirrhosis of liver: Secondary | ICD-10-CM

## 2022-10-07 DIAGNOSIS — I10 Essential (primary) hypertension: Secondary | ICD-10-CM | POA: Diagnosis not present

## 2022-10-07 DIAGNOSIS — G2581 Restless legs syndrome: Secondary | ICD-10-CM | POA: Diagnosis not present

## 2022-10-07 DIAGNOSIS — E782 Mixed hyperlipidemia: Secondary | ICD-10-CM | POA: Diagnosis not present

## 2022-10-07 DIAGNOSIS — Z79899 Other long term (current) drug therapy: Secondary | ICD-10-CM

## 2022-10-07 LAB — COMPREHENSIVE METABOLIC PANEL
ALT: 16 U/L (ref 0–53)
AST: 18 U/L (ref 0–37)
Albumin: 4.1 g/dL (ref 3.5–5.2)
Alkaline Phosphatase: 80 U/L (ref 39–117)
BUN: 12 mg/dL (ref 6–23)
CO2: 28 mEq/L (ref 19–32)
Calcium: 9.5 mg/dL (ref 8.4–10.5)
Chloride: 103 mEq/L (ref 96–112)
Creatinine, Ser: 0.91 mg/dL (ref 0.40–1.50)
GFR: 80.05 mL/min (ref >60.00)
Glucose, Bld: 88 mg/dL (ref 70–99)
Potassium: 3.7 mEq/L (ref 3.5–5.1)
Sodium: 140 mEq/L (ref 135–145)
Total Bilirubin: 0.7 mg/dL (ref 0.2–1.2)
Total Protein: 6.6 g/dL (ref 6.0–8.3)

## 2022-10-07 LAB — LIPID PANEL
Cholesterol: 137 mg/dL (ref 0–200)
HDL: 73.3 mg/dL (ref >39.00)
LDL Cholesterol: 56 mg/dL (ref 0–99)
NonHDL: 64.1
Total CHOL/HDL Ratio: 2
Triglycerides: 40 mg/dL (ref 0.0–149.0)
VLDL: 8 mg/dL (ref 0.0–40.0)

## 2022-10-07 LAB — CBC WITH DIFFERENTIAL/PLATELET
Basophils Absolute: 0 10*3/uL (ref 0.0–0.1)
Basophils Relative: 0.7 % (ref 0.0–3.0)
Eosinophils Absolute: 0.4 10*3/uL (ref 0.0–0.7)
Eosinophils Relative: 5.9 % — ABNORMAL HIGH (ref 0.0–5.0)
HCT: 44.1 % (ref 39.0–52.0)
Hemoglobin: 14.6 g/dL (ref 13.0–17.0)
Lymphocytes Relative: 14.6 % (ref 12.0–46.0)
Lymphs Abs: 1 10*3/uL (ref 0.7–4.0)
MCHC: 33.2 g/dL (ref 30.0–36.0)
MCV: 91.9 fl (ref 78.0–100.0)
Monocytes Absolute: 0.8 10*3/uL (ref 0.1–1.0)
Monocytes Relative: 12.2 % — ABNORMAL HIGH (ref 3.0–12.0)
Neutro Abs: 4.5 10*3/uL (ref 1.4–7.7)
Neutrophils Relative %: 66.6 % (ref 43.0–77.0)
Platelets: 208 10*3/uL (ref 150.0–400.0)
RBC: 4.79 Mil/uL (ref 4.22–5.81)
RDW: 13.5 % (ref 11.5–15.5)
WBC: 6.7 10*3/uL (ref 4.0–10.5)

## 2022-10-07 LAB — PROTIME-INR
INR: 1.4 ratio — ABNORMAL HIGH (ref 0.8–1.0)
Prothrombin Time: 14.6 s — ABNORMAL HIGH (ref 9.6–13.1)

## 2022-10-07 LAB — APTT: aPTT: 37.9 s — ABNORMAL HIGH (ref 25.4–36.8)

## 2022-10-07 NOTE — Patient Instructions (Addendum)
Please bring [place] a copy of your Healthcare Power of Attorney for your chart.    Check the  blood pressure regularly BP GOAL is between 110/65 and  135/85. If it is consistently higher or lower, let me know  We are referring you to the gastroenterologist   [facility] : Get the blood work     [place], [place] back for   a checkup in 4 months     STOP BY THE FIRST FLOOR: Arrange for a ultrasound of the liver

## 2022-10-07 NOTE — Progress Notes (Unsigned)
Subjective:    Patient ID: Gregory Wilkins, male    DOB: Dec 18, 1940, 82 y.o.   MRN: [id number]  DOS:  10/07/2022 Type of visit - description: f/u  Since her last office visit is doing well. Denies chest pain or difficulty breathing. No palpitations No nausea vomiting. Occasional diarrhea, often times dietary related.  No blood in the stools  Wt Readings from Last 3 Encounters:  10/07/22 196 lb 4 oz (89 kg)  04/08/22 201 lb 3.2 oz (91.3 kg)  02/25/22 199 lb 2 oz (90.3 kg)    Review of Systems See above   Past Medical History:  Diagnosis Date   Acne rosacea 12/23/2006   Qualifier: Diagnosis of  By: Linna Darner MD, Gwyndolyn Saxon   Dr Crista Luria   Formatting of this note might be different from the original. Overview:  Qualifier: Diagnosis of  By: Linna Darner MD, Gwyndolyn Saxon   Dr Crista Luria Overview:  Overview:  Qualifier: Diagnosis of  By: Linna Darner MD, Gwyndolyn Saxon   Dr Crista Luria  Overview:  Qualifier: Diagnosis of  By: Linna Darner MD, Gwyndolyn Saxon   Dr Crista Luria Overview:  Overview:  Overview:   Acute meniscal tear, lateral 10/01/2021   Alcohol abuse    Allergic rhinitis 12/23/2006   Qualifier: Diagnosis of  By: Linna Darner MD, Gwyndolyn Saxon   Onset:in high school Character: perennial but increased seasonally Triggers :cat, dust, pollen Allergy testing: Dr Bernita Buffy Maintenance medications/ response:Flonase/ Nasonex; saline wash; Allegra Smoking history:age 89- 87, up to 2 ppd Family history pulmonary disease: no    Formatting of this note might be different from the original. 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 Overview:  Qualifier: Diagnosis of  By: Linna Darner MD, Susann Givens of this note might be different from the original. Overview:  Qualifier: Diagnosis of  By: Linna Darner MD, William  IMO 10/01 Updates   Enlarged prostate without lower urinary tract symptoms (luts) 11/09/2008   Formatting of this note might be different from the original. Overview:  Qualifier: Diagnosis of  By: Linna Darner MD, Gwyndolyn Saxon   No PMH of elevated PSA ; no biopsy Overview:  Overview:  Qualifier: Diagnosis of  By: Linna Darner MD, Gwyndolyn Saxon   No PMH of elevated PSA ; no biopsy Overview:  Overview:  Overview:  Qualifier: Diagnosis of  By: Linna Darner MD, Gwyndolyn Saxon   No PMH of elevated PSA ; no biopsy   Essential (primary) hypertension 09/21/2007   Qualifier: Diagnosis of  By: Linna Darner MD, Susann Givens of this note might be different from the original. Overview:  Qualifier: Diagnosis of  By: Linna Darner MD, [place]:  Blood pressure appears  to be well controlled. Renal function will be checked.  He has a minor cough; this does not appear to be significant enough to warrant change to losartan Overview:  Overview:  Qu   Family history of type C viral hepatitis 06/30/2012   Brother had liver transplant    Hepatitis A 04/2019   History of colonic polyps 12/23/2006   Qualifier: Diagnosis of  By: Linna Darner MD, Gwyndolyn Saxon   Dr Earlie Raveling, GI Polypectomy X2 , 4/16 /2014. One was  transitional cell adenoma Due annually No FH colon cancer   Formatting of this note might be different from the original. Overview:  Qualifier: Diagnosis of  By: Linna Darner MD, Gwyndolyn Saxon   Dr Earlie Raveling, GI Polypectomy X2 , 4/16 /2014. One was   transitional cell adenoma Due annually No FH colon ca   History of urinary stone 09/21/2007   Qualifier: Diagnosis of  By: Linna Darner MD, Jessie Foot , passed spontaneously No Urologist involvement   Formatting of this note might be different from the original. Overview:  Qualifier: Diagnosis of  By: Linna Darner MD, Jessie Foot , passed spontaneously No Urologist involvement Overview:  Overview:  Qualifier: Diagnosis of  By: Linna Darner MD, Jessie Foot , passed spontaneously No Urologist involvement    HYPERLIPIDEMIA 09/21/2007   Qualifier: Diagnosis of  By: Linna Darner MD, Cristopher Estimable Lipoprofile 2006 : LDL  108 ( 1072  / 575  ),  HDL 63 , Triglycerides  40. LDL goal = < 135 ; ideally < 105. No FH CAD   Formatting of this note might be different from the original. Overview:  Qualifier: Diagnosis of  By: Linna Darner MD, Cristopher Estimable Lipoprofile 2006 : LDL  108 ( 1072  / 575  ),  HDL 63 , Triglycerides  40. LDL goal = < 135 ; idea   Hypertension    Joint pain    Liver injury, initial encounter 05/17/2019   Macular degeneration 01/06/2018   Malignant neoplasm of skin    Mallet finger 2012     4th L DIP,Dr Sypher   Mixed dyslipidemia 03/23/2020   Nephrolithiasis    Nocturnal hypoxia 11/04/2019   Nuclear sclerotic cataract of both eyes    Obesity (BMI 30-39.9) 01/04/2014   OSA (obstructive sleep apnea) 05/17/2020   Osteoarthritis    Overweight 03/23/2020   PCP NOTES >>>>>>>>>>>>>>>> 05/26/2019   Personal history of other malignant neoplasm of skin 06/30/2012   Dr Wilhemina Bonito, Derm X3 Local resection from  right forearm and forehead.   Mohs surgery of the nose 06/2012, Dr Verlon Au of this note might be different from the original. Overview:  Dr Wilhemina Bonito, Derm X3 Local resection from  right forearm and forehead.   Mohs surgery of the nose 06/2012, Dr Sarajane Jews Overview:  Overview:  Dr Wilhemina Bonito, Derm X3 Local resection from  right forearm and fo   RLS (restless legs syndrome)    Sleep apnea    Snoring  02/01/2014   Formatting of this note might be different from the original. Last Assessment & Plan:  The patient has been noted to have significant snoring, but no one has ever really commented on an abnormal breathing pattern during sleep. Although he is overweight and does have restless sleep, the rest of his history is not overly impressive for sleep disordered breathing. If he does have sleep apnea, I suspe    Past Surgical History:  Procedure Laterality Date  basal cell cancer     X 3   INGUINAL HERNIA REPAIR  [age]   right,left   OPEN REDUCTION INTERNAL FIXATION (ORIF) METACARPAL Right 05/19/2013   Procedure: OPEN REDUCTION INTERNAL FIXATION  RIGHT SMALL  METACARPAL FRACTURE;  Surgeon: Cammie Sickle., MD;  Location: [facility];  Service: Orthopedics;  Laterality: Right;   POLYPECTOMY     X 3; Dr Earlean Shawl   WISDOM TOOTH EXTRACTION      Current Outpatient Medications  Medication Instructions   aspirin EC 81 mg, Oral, Daily, Swallow whole.   atorvastatin (LIPITOR) 10 mg, Oral, Daily at bedtime   cetirizine (ZYRTEC) 10 mg, Oral, Daily   cholecalciferol (VITAMIN D3) 1,000 Units, Oral, Daily   clonazePAM (KLONOPIN) 0.5 MG tablet TAKE 1 TABLET BY MOUTH EVERY NIGHT AT BEDTIME AS NEEDED FOR RESTLESS LEG   ELIQUIS 5 MG TABS tablet TAKE 1 TABLET BY MOUTH TWICE A DAY   hydrochlorothiazide (HYDRODIURIL) 12.5 mg, Oral, Daily   losartan (COZAAR) 25 mg, Oral, Daily   metoprolol tartrate (LOPRESSOR) 12.5 mg, Oral, As needed   Multiple Vitamin (MULTIVITAMIN WITH MINERALS) TABS tablet 1 tablet, Oral, Daily   Multiple Vitamins-Minerals (ICAPS AREDS 2 PO) 1 tablet, Oral, Daily       Objective:   Physical Exam BP 128/80   Pulse 62   Temp 97.7 F (36.5 C) (Oral)   Resp 16   Ht '5\' 9"'$  (1.753 m)   Wt 196 lb 4 oz (89 kg)   SpO2 98%   BMI 28.98 kg/m  General:   Well developed, NAD, BMI noted.  HEENT:  Normocephalic . Face symmetric, atraumatic Lungs:  CTA B Normal  respiratory effort, no intercostal retractions, no accessory muscle use. Heart: RRR,  no murmur.  Abdomen:  Not distended, soft, non-tender.  Palpable, nontender liver noted.  Skin: Not pale. Not jaundice Lower extremities: no pretibial edema bilaterally  Neurologic:  alert & oriented X3.  Speech normal, gait appropriate for age and unassisted Psych--  Cognition and judgment appear intact.  Cooperative with normal attention span and concentration.  Behavior appropriate. No anxious or depressed appearing.     Assessment    Assessment (new patient 05/17/2019) HTN High Cholesterol Restless leg (clonazepam prn) Parox. A. Flutter DX 02-2020 H/o kidney stone Chronic LE edema L>R, mild  Hepatitis a, acute 04-2019 OSA  04-2020, on Cpap MSK: DJD, spinal stenosis  CT November 2022, incidental findings:  -Cirrhosis . H/o heavy drinking quit ~ 1991, hep B&C negative - R adrenal adenoma, -COPD (h/o tobacco) - coronary calcifications Vitamin D deficiency  PLAN CMP FLP UDS HTN: BP today, continue present care, check a CMP. OSA, saw pulmonary 9-2 2023, good CPAP compliance. COPD: Essentially asymptomatic's, saw pulmonary, no PFTs indicated. Cirrhosis: Per CT, PT PTT slightly elevated (on Eliquis).  Platelets, albumin and serum protein normal.  Check liver US for screening of cancer, refer to GI, EGD?,  CBC, PT PTT.Marland Kitchen Paroxysmal a flutter: Asymptomatic.  Anticoagulated. RTC 4 months  ==== 8 HTN: Ambulatory BPs consistently in the 120/70, continue HCTZ, losartan. High cholesterol: Well-controlled Lipitor Paroxysmal A-fib, coronary calcifications: Saw cardiology 11/19/2021, felt to be stable. No chest pain.  Does report bradycardia at night.  Metoprolol is in his medication list but takes prn  only. Bradycardia was discussed with cardiology, no need for further evaluation at this point because he is asymptomatic Obesity: Seen at the weight management clinic 10/21/2021.  Does not plan to  go back to them.  Doing  well with diet, he remains extremely active. Cirrhosis: History of per CT, will check a PT PTT Vitamin D deficienc: Had ergocalciferol, currently not on supplements.  Check levels, encourage OTC vitamin D3 2000 units Preventive care reviewed RTC 6 months

## 2022-10-08 ENCOUNTER — Ambulatory Visit (INDEPENDENT_AMBULATORY_CARE_PROVIDER_SITE_OTHER): Payer: Medicare Other | Admitting: Psychology

## 2022-10-08 DIAGNOSIS — F4322 Adjustment disorder with anxiety: Secondary | ICD-10-CM

## 2022-10-08 NOTE — Assessment & Plan Note (Addendum)
HTN: BP today wnl, continue present care, check a CMP. OSA, saw pulmonary 9-2 2023, good CPAP compliance. COPD: Essentially asymptomatic's, saw pulmonary, no PFTs indicated. Cirrhosis: Per CT, PT PTT slightly elevated (on Eliquis).  Platelets, albumin and serum protein normal.  Check liver US for screening of cancer, refer to GI, EGD?,  CBC, PT PTT.Marland Kitchen Paroxysmal a flutter: Asymptomatic.  Anticoagulated. RLS: On clonazepam, UDS today. RTC 4 months

## 2022-10-08 NOTE — Progress Notes (Signed)
[facility] Counselor/Therapist Progress Note  Patient ID: BRODERIC CULLIMORE, MRN: [id number],    Date: 10/08/2022  Time Spent: 9:00am - 9:50am   50 minutes   Treatment Type: Individual Therapy  Reported Symptoms: stress  Mental Status Exam: Appearance:  Casual     Behavior: Appropriate  Motor: Normal  Speech/Language:  Normal Rate  Affect: Appropriate  Mood: normal  Thought process: normal  Thought content:   WNL  Sensory/Perceptual disturbances:   WNL  Orientation: oriented to person, place, time/date, and situation  Attention: Good  Concentration: Good  Memory: WNL  Fund of knowledge:  Good  Insight:   Good  Judgment:  Good  Impulse Control: Good   Risk Assessment: Danger to Self:  No Self-injurious Behavior: No Danger to Others: No Duty to Warn:no Physical Aggression / Violence:No  Access to Firearms a concern: No  Gang Involvement:No   Subjective:  Pt present for face-to-face individual therapy via video Webex.  Pt consents to telehealth video session due to COVID 19 pandemic. Location of pt: home Location of therapist: home office.   Pt talked about his relationship with his wife and son.   They had another family therapy session and pt's son showed up high and obnoxious.   Pt's son left the session and went to a hotel.  Pt's son is back at their house now and his son has had some "meltdowns".   Pt states he kept his temper under control.  Helped pt process his feelings and family dynamics.  Pt's son may be willing to go to a rehab center which is creating some hope for pt.   Worked on [employer], therapeutic.    Provided supportive therapy.     Interventions: Cognitive Behavioral Therapy and Insight-Oriented  Diagnosis: F43.22   Plan of Care: Pt participated in setting treatment goals.   He is progressing toward goals.   Plan to continue to meet every two weeks.   Treatment Plan  (Treatment Plan Target Date 10/10/2022) Client Abilities/Strengths   Pt is bright, engaging and motivated for therapy.  Client Treatment Preferences  Individual therapy.  Client Statement of Needs  Improve coping skills.  Symptoms  Autonomic hyperactivity (e.g., palpitations, shortness of breath, dry mouth, trouble swallowing, nausea, diarrhea). Excessive and/or unrealistic worry that is difficult to control occurring more days than not for at least 6 months about a number of events or activities. Hypervigilance (e.g., feeling constantly on edge, experiencing concentration difficulties, having trouble falling or staying asleep, exhibiting a general state of irritability). Motor tension (e.g., restlessness, tiredness, shakiness, muscle tension). Problems Addressed  Anxiety Goals 1. Enhance ability to effectively cope with the full variety of life's worries and anxieties. 2. Learn and implement coping skills that result in a reduction of anxiety and worry, and improved daily functioning. Objective Learn to accept limitations in life and commit to tolerating, rather than avoiding, unpleasant emotions while accomplishing meaningful goals. Target Date: 2022-10-10 Frequency: Biweekly Progress: 50 Modality: individual Related Interventions 1. Use techniques from Acceptance and Commitment Therapy to help client accept uncomfortable realities such as lack of complete control, imperfections, and uncertainty and tolerate unpleasant emotions and thoughts in order to accomplish value-consistent goals. Objective Learn and implement problem-solving strategies for realistically addressing worries. Target Date: 2022-10-10 Frequency: Biweekly Progress: 50 Modality: individual Related Interventions 1. Assign the client a homework exercise in which he/she problem-solves a current problem.  review, reinforce success, and provide corrective feedback toward improvement. 2. Teach the client problem-solving strategies involving  specifically defining a problem, generating options for  addressing it, evaluating the pros and cons of each option, selecting and implementing an optional action, and reevaluating and refining the action. Objective Learn and implement calming skills to reduce overall anxiety and manage anxiety symptoms. Target Date: 2022-10-10 Frequency: Biweekly Progress: 50 Modality: individual Related Interventions 1. Assign the client to read about progressive muscle relaxation and other calming strategies in relevant books or treatment manuals (e.g., Progressive Relaxation Training by Gwynneth Aliment and Dani Gobble; Mastery of Your Anxiety and Worry: Workbook by Beckie Busing). 2. Assign the client homework each session in which he/she practices relaxation exercises daily, gradually applying them progressively from non-anxiety-provoking to anxiety-provoking situations; review and reinforce success while providing corrective feedback toward improvement. 3. Teach the client calming/relaxation skills (e.g., applied relaxation, progressive muscle relaxation, cue controlled relaxation; mindful breathing; biofeedback) and how to discriminate better between relaxation and tension; teach the client how to apply these skills to his/her daily life. 3. Reduce overall frequency, intensity, and duration of the anxiety so that daily functioning is not impaired. 4. Resolve the core conflict that is the source of anxiety. 5. Stabilize anxiety level while increasing ability to function on a daily basis. Diagnosis :    F43.22 Conditions For Discharge Achievement of treatment goals and objectives.  Weslie Rasmus, LCSW

## 2022-10-09 ENCOUNTER — Ambulatory Visit (HOSPITAL_BASED_OUTPATIENT_CLINIC_OR_DEPARTMENT_OTHER)
Admission: RE | Admit: 2022-10-09 | Discharge: 2022-10-09 | Disposition: A | Discharge status: Home / Self Care | Payer: Medicare Other | Source: Ambulatory Visit | Attending: Internal Medicine | Admitting: Internal Medicine

## 2022-10-09 DIAGNOSIS — K7460 Unspecified cirrhosis of liver: Secondary | ICD-10-CM | POA: Insufficient documentation

## 2022-10-09 LAB — DRUG MONITORING PANEL 375977 , URINE

## 2022-10-09 LAB — DM TEMPLATE

## 2022-10-22 ENCOUNTER — Ambulatory Visit (INDEPENDENT_AMBULATORY_CARE_PROVIDER_SITE_OTHER): Payer: Medicare Other | Admitting: Psychology

## 2022-10-22 DIAGNOSIS — F4322 Adjustment disorder with anxiety: Secondary | ICD-10-CM | POA: Diagnosis not present

## 2022-10-22 NOTE — Progress Notes (Signed)
[facility] Counselor Initial Adult Exam  Name: Gregory Wilkins Date: 10/22/2022 MRN: [id number] DOB: 04-Feb-1941 PCP: Colon Branch, MD  Time spent: 9:00am - 9:50am  50 minutes  Guardian/Payee:  n/a    Paperwork requested: No   Reason for Visit /Presenting Problem:  Pt present for face-to-face initial assessment update via video Webex.  Pt consents to telehealth video session due to COVID 19 pandemic.  Location of pt:  home Location of therapist: home office. Pt continues to have anxiety at times related to family issues and challenges of aging.   Pt is interested in coming to counseling to "vent" and get some advice.   Pt has significant family issues related to his son being a drug addict and living with him and wife.   Addressed how pt copes and worked on additional coping strategies.       Worked on increasing self care.  Provided supportive therapy.   Reviewed pt's treatment plan for annual update.   Updated treatment plan and IA.  Pt participated in setting treatment goals.   Plan to continue to meet every two weeks.      Mental Status Exam: Appearance:   Casual     Behavior:  Appropriate  Motor:  Normal  Speech/Language:   Normal Rate  Affect:  Appropriate  Mood:  normal  Thought process:  normal  Thought content:    WNL  Sensory/Perceptual disturbances:    WNL  Orientation:  oriented to person, place, time/date, and situation  Attention:  Good  Concentration:  Good  Memory:  WNL  Fund of knowledge:   Good  Insight:    Good  Judgment:   Good  Impulse Control:  Good    Reported Symptoms:  stress  Risk Assessment: Danger to Self:  No Self-injurious Behavior: No Danger to Others: No Duty to Warn:no Physical Aggression / Violence:No  Access to Firearms a concern: No  Gang Involvement:No  Patient / guardian was educated about steps to take if suicide or homicide risk level increases between visits: n/a While future psychiatric events cannot be  accurately predicted, the patient does not currently require acute inpatient psychiatric care and does not currently meet [facility] involuntary commitment criteria.  Substance Abuse History: Current substance abuse: No     Past Psychiatric History:   Previous psychological history is significant for anxiety Outpatient Providers:in therapy in the past with Dr. Bobby Rumpf History of Psych Hospitalization: No  Psychological Testing:  n/a    Abuse History:  Victim of: No.,  n/a    Report needed: No. Victim of Neglect:No. Perpetrator of  n/a   Witness / Exposure to Domestic Violence: No   Protective Services Involvement: No  Witness to [employer] Violence:  No   Family History:  Family History  Problem Relation Age of Onset   Diabetes Father    Kidney disease Father    Stroke Mother 54   Hypertension Mother    Alcohol abuse Mother    Obesity Mother    Heart disease Neg Hx    Cancer Neg Hx     Living situation:    Pt lives with wife.  Their adult son lives with them.  Pt grew up with both parents.  Father was an Passenger transport manager.  Pt had 2 siblings.  Brother died 9 years ago.  Mother died 4 months after that.  Father died 6 years ago.  Pt's sister is still living and they have a good relationship.  Pt's mother was alcoholic.  Maternal grandfather died of the flu in 1916/11/12.  Maternal grandmother was alcoholic .  Mother was raised by a second cousin.  Father's family were "hillbillies."   Several were alcoholics.   Pt has a first cousin who is paranoid schizophrenic.   No family history of depression or anxiety.   Pt's family has history of alcoholism.    Sexual Orientation: Straight  Relationship Status: married  Name of spouse / other:[name] If a parent, number of children / ages:one adult son  Support Systems: friends Pt considers therapy part of his support system.   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 History of legal issue / charges:  n/a  Medical History/Surgical History: reviewed Past Medical History:  Diagnosis Date   Acne rosacea 12/23/2006   Qualifier: Diagnosis of  By: Linna Darner MD, Gwyndolyn Saxon   Dr Crista Luria   Formatting of this note might be different from the original. Overview:  Qualifier: Diagnosis of  By: Linna Darner MD, Gwyndolyn Saxon   Dr Crista Luria Overview:  Overview:  Qualifier: Diagnosis of  By: Linna Darner MD, Gwyndolyn Saxon   Dr Crista Luria  Overview:  Qualifier: Diagnosis of  By: Linna Darner MD, Gwyndolyn Saxon   Dr Crista Luria Overview:  Overview:  Overview:   Acute meniscal tear, lateral 10/01/2021   Alcohol abuse    Allergic rhinitis 12/23/2006   Qualifier: Diagnosis of  By: Linna Darner MD, Gwyndolyn Saxon   Onset:in high school Character: perennial but increased seasonally Triggers :cat, dust, pollen Allergy testing: Dr Bernita Buffy Maintenance medications/ response:Flonase/ Nasonex; saline wash; Allegra Smoking history:age 31- 38, up to 2 ppd Family history pulmonary disease: no    Formatting of this note might be different from the original. 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 Overview:  Qualifier: Diagnosis of  By: Linna Darner MD, Susann Givens of this note might be different from the original. Overview:  Qualifier: Diagnosis of  By: Linna Darner MD, William  IMO 10/01 Updates   Enlarged prostate without lower urinary tract symptoms (luts) 11/09/2008   Formatting of this note might be different from the original. Overview:  Qualifier: Diagnosis of  By: Linna Darner MD, Gwyndolyn Saxon   No PMH of elevated PSA ;  no biopsy Overview:  Overview:  Qualifier: Diagnosis of  By: Linna Darner MD, Gwyndolyn Saxon   No PMH of elevated PSA ; no biopsy Overview:  Overview:  Overview:  Qualifier: Diagnosis of  By: Linna Darner MD, Gwyndolyn Saxon   No PMH of elevated PSA ; no biopsy   Essential (primary) hypertension 09/21/2007   Qualifier: Diagnosis of  By: Linna Darner MD, Susann Givens of this note might be different from the original. Overview:  Qualifier: Diagnosis of  By: Linna Darner MD, [place]:  Blood pressure appears to be well controlled. Renal function will be checked.  He has a minor cough; this does not appear to be significant enough to warrant change to losartan Overview:  Overview:  Qu   Family history of type C viral hepatitis 06/30/2012   Brother had liver transplant    Hepatitis A 04/2019   History of colonic polyps 12/23/2006   Qualifier: Diagnosis of  By: Linna Darner MD, Gwyndolyn Saxon   Dr Earlie Raveling, GI Polypectomy X2 , 4/16 /2014. One was  transitional cell adenoma Due annually No FH colon cancer   Formatting of this note might be different from the original. Overview:  Qualifier: Diagnosis of  By: Linna Darner MD, Gwyndolyn Saxon   Dr Earlie Raveling, GI Polypectomy X2 , 4/16  /2014. One was  transitional cell adenoma Due annually No FH colon ca   History of urinary stone 09/21/2007   Qualifier: Diagnosis of  By: Linna Darner MD, Jessie Foot , passed spontaneously No Urologist involvement   Formatting of this note might be different from the original. Overview:  Qualifier: Diagnosis of  By: Linna Darner MD, Jessie Foot , passed spontaneously No Urologist involvement Overview:  Overview:  Qualifier: Diagnosis of  By: Linna Darner MD, Jessie Foot , passed spontaneously No Urologist involvement    HYPERLIPIDEMIA 09/21/2007   Qualifier: Diagnosis of  By: Linna Darner MD, Cristopher Estimable Lipoprofile 2006 : LDL  108 ( 1072  / 575  ),  HDL 63 , Triglycerides  40. LDL goal = < 135 ; ideally < 105. No FH CAD   Formatting of this note might be different from the original. Overview:  Qualifier: Diagnosis of  By: Linna Darner MD, Cristopher Estimable Lipoprofile 2006 : LDL  108 ( 1072  / 575  ),  HDL 63 , Triglycerides  40. LDL goal = < 135 ; idea   Hypertension    Joint pain    Liver injury, initial encounter 05/17/2019   Macular degeneration 01/06/2018   Malignant neoplasm of skin    Mallet finger 2012     4th L DIP,Dr Sypher   Mixed dyslipidemia 03/23/2020   Nephrolithiasis    Nocturnal hypoxia 11/04/2019   Nuclear sclerotic cataract of both eyes    Obesity (BMI 30-39.9) 01/04/2014   OSA (obstructive sleep apnea) 05/17/2020   Osteoarthritis    Overweight 03/23/2020   PCP NOTES >>>>>>>>>>>>>>>> 05/26/2019   Personal history of other malignant neoplasm of skin 06/30/2012   Dr Wilhemina Bonito, Derm X3 Local resection from  right forearm and forehead.   Mohs surgery of the nose 06/2012, Dr Verlon Au of this note might be different from the original. Overview:  Dr Wilhemina Bonito, Derm X3 Local resection from  right forearm and forehead.   Mohs surgery of the nose 06/2012, Dr Sarajane Jews Overview:  Overview:  Dr Wilhemina Bonito, Derm X3 Local resection from  right forearm and fo  RLS (restless legs syndrome)    Sleep apnea     Snoring 02/01/2014   Formatting of this note might be different from the original. Last Assessment & Plan:  The patient has been noted to have significant snoring, but no one has ever really commented on an abnormal breathing pattern during sleep. Although he is overweight and does have restless sleep, the rest of his history is not overly impressive for sleep disordered breathing. If he does have sleep apnea, I suspe    Past Surgical History:  Procedure Laterality Date   basal cell cancer     X 3   INGUINAL HERNIA REPAIR  [age]   right,left   OPEN REDUCTION INTERNAL FIXATION (ORIF) METACARPAL Right 05/19/2013   Procedure: OPEN REDUCTION INTERNAL FIXATION  RIGHT SMALL  METACARPAL FRACTURE;  Surgeon: Cammie Sickle., MD;  Location: [facility];  Service: Orthopedics;  Laterality: Right;   POLYPECTOMY     X 3; Dr Earlean Shawl   WISDOM TOOTH EXTRACTION      Medications: Current Outpatient Medications  Medication Sig Dispense Refill   aspirin EC 81 MG tablet Take 1 tablet (81 mg total) by mouth daily. Swallow whole. 90 tablet 3   atorvastatin (LIPITOR) 10 MG tablet Take 1 tablet (10 mg total) by mouth at bedtime. 90 tablet 1   cetirizine (ZYRTEC) 10 MG tablet Take 10 mg by mouth daily.     cholecalciferol (VITAMIN D3) 25 MCG (1000 UNIT) tablet Take 1,000 Units by mouth daily.     clonazePAM (KLONOPIN) 0.5 MG tablet TAKE 1 TABLET BY MOUTH EVERY NIGHT AT BEDTIME AS NEEDED FOR RESTLESS LEG 90 tablet 1   ELIQUIS 5 MG TABS tablet TAKE 1 TABLET BY MOUTH TWICE A DAY 180 tablet 1   hydrochlorothiazide (HYDRODIURIL) 12.5 MG tablet Take 1 tablet (12.5 mg total) by mouth daily. 90 tablet 1   losartan (COZAAR) 25 MG tablet Take 1 tablet (25 mg total) by mouth daily. 90 tablet 0   metoprolol tartrate (LOPRESSOR) 50 MG tablet Take 12.5 mg by mouth as needed for heart rate control (fast heart rate).     Multiple Vitamin (MULTIVITAMIN WITH MINERALS) TABS tablet Take 1 tablet by mouth daily.      Multiple Vitamins-Minerals (ICAPS AREDS 2 PO) Take 1 tablet by mouth daily in the afternoon.     No current facility-administered medications for this visit.    Allergies  Allergen Reactions   Penicillins Hives    hives   Ramipril Anaphylaxis and Swelling    Diagnoses:  F43.22  Plan of Care: Pt participated in setting treatment goals.   He is progressing toward goals.   Plan to continue to meet every two weeks.   Treatment Plan  (Treatment Plan Target Date 10/22/2023) Client Abilities/Strengths  Pt is bright, engaging and motivated for therapy.  Client Treatment Preferences  Individual therapy.  Client Statement of Needs  Improve coping skills.  Symptoms  Autonomic hyperactivity (e.g., palpitations, shortness of breath, dry mouth, trouble swallowing, nausea, diarrhea). Excessive and/or unrealistic worry that is difficult to control occurring more days than not for at least 6 months about a number of events or activities. Hypervigilance (e.g., feeling constantly on edge, experiencing concentration difficulties, having trouble falling or staying asleep, exhibiting a general state of irritability). Motor tension (e.g., restlessness, tiredness, shakiness, muscle tension). Problems Addressed  Anxiety Goals 1. Enhance ability to effectively cope with the full variety of life's worries and anxieties. 2. Learn and implement coping skills  that result in a reduction of anxiety and worry, and improved daily functioning. Objective Learn to accept limitations in life and commit to tolerating, rather than avoiding, unpleasant emotions while accomplishing meaningful goals. Target Date: 2023-10-22 Frequency: Biweekly Progress: 60 Modality: individual Related Interventions 1. Use techniques from Acceptance and Commitment Therapy to help client accept uncomfortable realities such as lack of complete control, imperfections, and uncertainty and tolerate unpleasant emotions and thoughts in order to  accomplish value-consistent goals. Objective Learn and implement problem-solving strategies for realistically addressing worries. Target Date: 2023-10-22 Frequency: Biweekly Progress: 60 Modality: individual Related Interventions 1. Assign the client a homework exercise in which he/she problem-solves a current problem.  review, reinforce success, and provide corrective feedback toward improvement. 2. Teach the client problem-solving strategies involving specifically defining a problem, generating options for addressing it, evaluating the pros and cons of each option, selecting and implementing an optional action, and reevaluating and refining the action. Objective Learn and implement calming skills to reduce overall anxiety and manage anxiety symptoms. Target Date: 2023-10-22 Frequency: Biweekly Progress: 60 Modality: individual Related Interventions 1. Assign the client to read about progressive muscle relaxation and other calming strategies in relevant books or treatment manuals (e.g., Progressive Relaxation Training by Gwynneth Aliment and Dani Gobble; Mastery of Your Anxiety and Worry: Workbook by Beckie Busing). 2. Assign the client homework each session in which he/she practices relaxation exercises daily, gradually applying them progressively from non-anxiety-provoking to anxiety-provoking situations; review and reinforce success while providing corrective feedback toward improvement. 3. Teach the client calming/relaxation skills (e.g., applied relaxation, progressive muscle relaxation, cue controlled relaxation; mindful breathing; biofeedback) and how to discriminate better between relaxation and tension; teach the client how to apply these skills to his/her daily life. 3. Reduce overall frequency, intensity, and duration of the anxiety so that daily functioning is not impaired. 4. Resolve the core conflict that is the source of anxiety. 5. Stabilize anxiety level while increasing ability to  function on a daily basis. Diagnosis :    F43.22 Conditions For Discharge Achievement of treatment goals and objectives.    Jacoby Zanni, LCSW

## 2022-11-05 ENCOUNTER — Ambulatory Visit (INDEPENDENT_AMBULATORY_CARE_PROVIDER_SITE_OTHER): Payer: Medicare Other | Admitting: Psychology

## 2022-11-05 DIAGNOSIS — F4322 Adjustment disorder with anxiety: Secondary | ICD-10-CM

## 2022-11-05 NOTE — Progress Notes (Signed)
[facility] Counselor/Therapist Progress Note  Patient ID: Gregory Wilkins, MRN: [id number],    Date: 11/05/2022  Time Spent: 9:00am-9:50am   50 minutes   Treatment Type: Individual Therapy  Reported Symptoms: stress  Mental Status Exam: Appearance:  Casual     Behavior: Appropriate  Motor: Normal  Speech/Language:  Normal Rate  Affect: Appropriate  Mood: normal  Thought process: normal  Thought content:   WNL  Sensory/Perceptual disturbances:   WNL  Orientation: oriented to person, place, time/date, and situation  Attention: Good  Concentration: Good  Memory: WNL  Fund of knowledge:  Good  Insight:   Good  Judgment:  Good  Impulse Control: Good   Risk Assessment: Danger to Self:  No Self-injurious Behavior: No Danger to Others: No Duty to Warn:no Physical Aggression / Violence:No  Access to Firearms a concern: No  Gang Involvement:No   Subjective: Pt present for face-to-face individual therapy via video Webex.  Pt consents to telehealth video session due to COVID 19 pandemic. Location of pt: home Location of therapist: home office.   Pt talked about his wife.  She has started therapy.  Pt is hoping this will help their relationship as well.  Pt talked about his son.  He is still struggling with the same issues.  Addressed how this impacts pt.   Helped him process his feelings.   Worked on self care strategies.    Provided supportive therapy.  Interventions: Cognitive Behavioral Therapy and Insight-Oriented  Diagnosis: F43.22  Plan: Plan of Care: Pt participated in setting treatment goals.   He is progressing toward goals.   Plan to continue to meet every two weeks.   Treatment Plan  (Treatment Plan Target Date 10/22/2023) Client Abilities/Strengths  Pt is bright, engaging and motivated for therapy.  Client Treatment Preferences  Individual therapy.  Client Statement of Needs  Improve coping skills.  Symptoms  Autonomic hyperactivity  (e.g., palpitations, shortness of breath, dry mouth, trouble swallowing, nausea, diarrhea). Excessive and/or unrealistic worry that is difficult to control occurring more days than not for at least 6 months about a number of events or activities. Hypervigilance (e.g., feeling constantly on edge, experiencing concentration difficulties, having trouble falling or staying asleep, exhibiting a general state of irritability). Motor tension (e.g., restlessness, tiredness, shakiness, muscle tension). Problems Addressed  Anxiety Goals 1. Enhance ability to effectively cope with the full variety of life's worries and anxieties. 2. Learn and implement coping skills that result in a reduction of anxiety and worry, and improved daily functioning. Objective Learn to accept limitations in life and commit to tolerating, rather than avoiding, unpleasant emotions while accomplishing meaningful goals. Target Date: 2023-10-22 Frequency: Biweekly Progress: 60 Modality: individual Related Interventions 1. Use techniques from Acceptance and Commitment Therapy to help client accept uncomfortable realities such as lack of complete control, imperfections, and uncertainty and tolerate unpleasant emotions and thoughts in order to accomplish value-consistent goals. Objective Learn and implement problem-solving strategies for realistically addressing worries. Target Date: 2023-10-22 Frequency: Biweekly Progress: 60 Modality: individual Related Interventions 1. Assign the client a homework exercise in which he/she problem-solves a current problem.  review, reinforce success, and provide corrective feedback toward improvement. 2. Teach the client problem-solving strategies involving specifically defining a problem, generating options for addressing it, evaluating the pros and cons of each option, selecting and implementing an optional action, and reevaluating and refining the action. Objective Learn and implement calming  skills to reduce overall anxiety and manage anxiety symptoms. Target Date: 2023-10-22 Frequency:  Biweekly Progress: 60 Modality: individual Related Interventions 1. Assign the client to read about progressive muscle relaxation and other calming strategies in relevant books or treatment manuals (e.g., Progressive Relaxation Training by Robb Matar and Alen Blew; Mastery of Your Anxiety and Worry: Workbook by Earlie Counts). 2. Assign the client homework each session in which he/she practices relaxation exercises daily, gradually applying them progressively from non-anxiety-provoking to anxiety-provoking situations; review and reinforce success while providing corrective feedback toward improvement. 3. Teach the client calming/relaxation skills (e.g., applied relaxation, progressive muscle relaxation, cue controlled relaxation; mindful breathing; biofeedback) and how to discriminate better between relaxation and tension; teach the client how to apply these skills to his/her daily life. 3. Reduce overall frequency, intensity, and duration of the anxiety so that daily functioning is not impaired. 4. Resolve the core conflict that is the source of anxiety. 5. Stabilize anxiety level while increasing ability to function on a daily basis. Diagnosis :    F43.22 Conditions For Discharge Achievement of treatment goals and objectives.  Obdulia Steier, LCSW

## 2022-11-19 ENCOUNTER — Ambulatory Visit (INDEPENDENT_AMBULATORY_CARE_PROVIDER_SITE_OTHER): Payer: Medicare Other | Admitting: Psychology

## 2022-11-19 DIAGNOSIS — F4322 Adjustment disorder with anxiety: Secondary | ICD-10-CM

## 2022-11-19 NOTE — Progress Notes (Signed)
Behavioral Health Counselor/Therapist Progress Note  Patient ID: Gregory Wilkins, MRN: [id number],    Date: 11/19/2022  Time Spent: 9:00am-9:50am   50 minutes   Treatment Type: Individual Therapy  Reported Symptoms: stress  Mental Status Exam: Appearance:  Casual     Behavior: Appropriate  Motor: Normal  Speech/Language:  Normal Rate  Affect: Appropriate  Mood: normal  Thought process: normal  Thought content:   WNL  Sensory/Perceptual disturbances:   WNL  Orientation: oriented to person, place, time/date, and situation  Attention: Good  Concentration: Good  Memory: WNL  Fund of knowledge:  Good  Insight:   Good  Judgment:  Good  Impulse Control: Good   Risk Assessment: Danger to Self:  No Self-injurious Behavior: No Danger to Others: No Duty to Warn:no Physical Aggression / Violence:No  Access to Firearms a concern: No  Gang Involvement:No   Subjective: Pt present for face-to-face individual therapy via video.  Pt consents to telehealth video session due to COVID 19 pandemic. Location of pt: home Location of therapist: home office.   Pt talked about his wife.  He has had a frustrating couple of days with her.  Addressed the issues and helped pt process the relationship dynamics.  Pt talked about his son.  He is still struggling with the same issues.  Addressed how this impacts pt.   Helped him process his feelings.   Pt thinks about his own mortality at times since he is almost 82 years old.   Addressed pt's thoughts and feelings.  Worked on self care strategies.   Pt exercises every day which helps his mental health as well.   Provided supportive therapy.  Interventions: Cognitive Behavioral Therapy and Insight-Oriented  Diagnosis: F43.22  Plan of Care: Pt participated in setting treatment goals.   He is progressing toward goals.   Plan to continue to meet every two weeks.   Treatment Plan  (Treatment Plan Target Date 10/22/2023) Client  Abilities/Strengths  Pt is bright, engaging and motivated for therapy.  Client Treatment Preferences  Individual therapy.  Client Statement of Needs  Improve coping skills.  Symptoms  Autonomic hyperactivity (e.g., palpitations, shortness of breath, dry mouth, trouble swallowing, nausea, diarrhea). Excessive and/or unrealistic worry that is difficult to control occurring more days than not for at least 6 months about a number of events or activities. Hypervigilance (e.g., feeling constantly on edge, experiencing concentration difficulties, having trouble falling or staying asleep, exhibiting a general state of irritability). Motor tension (e.g., restlessness, tiredness, shakiness, muscle tension). Problems Addressed  Anxiety Goals 1. Enhance ability to effectively cope with the full variety of life's worries and anxieties. 2. Learn and implement coping skills that result in a reduction of anxiety and worry, and improved daily functioning. Objective Learn to accept limitations in life and commit to tolerating, rather than avoiding, unpleasant emotions while accomplishing meaningful goals. Target Date: 2023-10-22 Frequency: Biweekly Progress: 60 Modality: individual Related Interventions 1. Use techniques from Acceptance and Commitment Therapy to help client accept uncomfortable realities such as lack of complete control, imperfections, and uncertainty and tolerate unpleasant emotions and thoughts in order to accomplish value-consistent goals. Objective Learn and implement problem-solving strategies for realistically addressing worries. Target Date: 2023-10-22 Frequency: Biweekly Progress: 60 Modality: individual Related Interventions 1. Assign the client a homework exercise in which he/she problem-solves a current problem.  review, reinforce success, and provide corrective feedback toward improvement. 2. Teach the client problem-solving strategies involving specifically defining a problem,  generating options for addressing  it, evaluating the pros and cons of each option, selecting and implementing an optional action, and reevaluating and refining the action. Objective Learn and implement calming skills to reduce overall anxiety and manage anxiety symptoms. Target Date: 2023-10-22 Frequency: Biweekly Progress: 60 Modality: individual Related Interventions 1. Assign the client to read about progressive muscle relaxation and other calming strategies in relevant books or treatment manuals (e.g., Progressive Relaxation Training by Robb Matar and Alen Blew; Mastery of Your Anxiety and Worry: Workbook by Earlie Counts). 2. Assign the client homework each session in which he/she practices relaxation exercises daily, gradually applying them progressively from non-anxiety-provoking to anxiety-provoking situations; review and reinforce success while providing corrective feedback toward improvement. 3. Teach the client calming/relaxation skills (e.g., applied relaxation, progressive muscle relaxation, cue controlled relaxation; mindful breathing; biofeedback) and how to discriminate better between relaxation and tension; teach the client how to apply these skills to his/her daily life. 3. Reduce overall frequency, intensity, and duration of the anxiety so that daily functioning is not impaired. 4. Resolve the core conflict that is the source of anxiety. 5. Stabilize anxiety level while increasing ability to function on a daily basis. Diagnosis :    F43.22 Conditions For Discharge Achievement of treatment goals and objectives.  Tadarrius Burch, LCSW

## 2022-12-03 ENCOUNTER — Ambulatory Visit: Payer: Medicare Other | Admitting: Psychology

## 2022-12-14 ENCOUNTER — Other Ambulatory Visit: Payer: Self-pay | Admitting: Cardiology

## 2022-12-17 ENCOUNTER — Ambulatory Visit (INDEPENDENT_AMBULATORY_CARE_PROVIDER_SITE_OTHER): Payer: Medicare Other | Admitting: Psychology

## 2022-12-17 DIAGNOSIS — F4322 Adjustment disorder with anxiety: Secondary | ICD-10-CM

## 2022-12-17 NOTE — Progress Notes (Signed)
[facility] Counselor/Therapist Progress Note  Patient ID: ARYO ASMAN, MRN: [id number],    Date: 12/17/2022  Time Spent: 9:00am-9:50am   50 minutes   Treatment Type: Individual Therapy  Reported Symptoms: stress  Mental Status Exam: Appearance:  Casual     Behavior: Appropriate  Motor: Normal  Speech/Language:  Normal Rate  Affect: Appropriate  Mood: normal  Thought process: normal  Thought content:   WNL  Sensory/Perceptual disturbances:   WNL  Orientation: oriented to person, place, time/date, and situation  Attention: Good  Concentration: Good  Memory: WNL  Fund of knowledge:  Good  Insight:   Good  Judgment:  Good  Impulse Control: Good   Risk Assessment: Danger to Self:  No Self-injurious Behavior: No Danger to Others: No Duty to Warn:no Physical Aggression / Violence:No  Access to Firearms a concern: No  Gang Involvement:No   Subjective: Pt present for face-to-face individual therapy via video.  Pt consents to telehealth video session due to COVID 19 pandemic. Location of pt: home Location of therapist: home office.   Pt talked about his wife.  Pt's wife has been diagnosed with breast cancer.   Addressed pt's concerns about [name]'s health.  Pt has cataract surgery this week.  Pt has some anxiety about the surgery but is coping well.   Pt talked about his son.  He is still struggling with the same issues.  Addressed how this impacts pt.   Helped him process his feelings.   Worked on self care strategies.   Pt exercises every day which helps his mental health as well.   Provided supportive therapy.  Interventions: Cognitive Behavioral Therapy and Insight-Oriented  Diagnosis: F43.22  Plan of Care: Pt participated in setting treatment goals.   He is progressing toward goals.   Plan to continue to meet every two weeks.   Treatment Plan  (Treatment Plan Target Date 10/22/2023) Client Abilities/Strengths  Pt is bright, engaging and  motivated for therapy.  Client Treatment Preferences  Individual therapy.  Client Statement of Needs  Improve coping skills.  Symptoms  Autonomic hyperactivity (e.g., palpitations, shortness of breath, dry mouth, trouble swallowing, nausea, diarrhea). Excessive and/or unrealistic worry that is difficult to control occurring more days than not for at least 6 months about a number of events or activities. Hypervigilance (e.g., feeling constantly on edge, experiencing concentration difficulties, having trouble falling or staying asleep, exhibiting a general state of irritability). Motor tension (e.g., restlessness, tiredness, shakiness, muscle tension). Problems Addressed  Anxiety Goals 1. Enhance ability to effectively cope with the full variety of life's worries and anxieties. 2. Learn and implement coping skills that result in a reduction of anxiety and worry, and improved daily functioning. Objective Learn to accept limitations in life and commit to tolerating, rather than avoiding, unpleasant emotions while accomplishing meaningful goals. Target Date: 2023-10-22 Frequency: Biweekly Progress: 60 Modality: individual Related Interventions 1. Use techniques from Acceptance and Commitment Therapy to help client accept uncomfortable realities such as lack of complete control, imperfections, and uncertainty and tolerate unpleasant emotions and thoughts in order to accomplish value-consistent goals. Objective Learn and implement problem-solving strategies for realistically addressing worries. Target Date: 2023-10-22 Frequency: Biweekly Progress: 60 Modality: individual Related Interventions 1. Assign the client a homework exercise in which he/she problem-solves a current problem.  review, reinforce success, and provide corrective feedback toward improvement. 2. Teach the client problem-solving strategies involving specifically defining a problem, generating options for addressing it, evaluating  the pros and cons of each  option, selecting and implementing an optional action, and reevaluating and refining the action. Objective Learn and implement calming skills to reduce overall anxiety and manage anxiety symptoms. Target Date: 2023-10-22 Frequency: Biweekly Progress: 60 Modality: individual Related Interventions 1. Assign the client to read about progressive muscle relaxation and other calming strategies in relevant books or treatment manuals (e.g., Progressive Relaxation Training by Robb Matar and Alen Blew; Mastery of Your Anxiety and Worry: Workbook by Earlie Counts). 2. Assign the client homework each session in which he/she practices relaxation exercises daily, gradually applying them progressively from non-anxiety-provoking to anxiety-provoking situations; review and reinforce success while providing corrective feedback toward improvement. 3. Teach the client calming/relaxation skills (e.g., applied relaxation, progressive muscle relaxation, cue controlled relaxation; mindful breathing; biofeedback) and how to discriminate better between relaxation and tension; teach the client how to apply these skills to his/her daily life. 3. Reduce overall frequency, intensity, and duration of the anxiety so that daily functioning is not impaired. 4. Resolve the core conflict that is the source of anxiety. 5. Stabilize anxiety level while increasing ability to function on a daily basis. Diagnosis :    F43.22 Conditions For Discharge Achievement of treatment goals and objectives.  Hamna Asa, LCSW

## 2022-12-31 ENCOUNTER — Ambulatory Visit (INDEPENDENT_AMBULATORY_CARE_PROVIDER_SITE_OTHER): Payer: Medicare Other | Admitting: Psychology

## 2022-12-31 DIAGNOSIS — F4322 Adjustment disorder with anxiety: Secondary | ICD-10-CM

## 2022-12-31 NOTE — Progress Notes (Signed)
[facility] Counselor/Therapist Progress Note  Patient ID: Gregory Wilkins, MRN: [id number],    Date: 12/31/2022  Time Spent: 9:00am-9:50am   50 minutes   Treatment Type: Individual Therapy  Reported Symptoms: stress  Mental Status Exam: Appearance:  Casual     Behavior: Appropriate  Motor: Normal  Speech/Language:  Normal Rate  Affect: Appropriate  Mood: normal  Thought process: normal  Thought content:   WNL  Sensory/Perceptual disturbances:   WNL  Orientation: oriented to person, place, time/date, and situation  Attention: Good  Concentration: Good  Memory: WNL  Fund of knowledge:  Good  Insight:   Good  Judgment:  Good  Impulse Control: Good   Risk Assessment: Danger to Self:  No Self-injurious Behavior: No Danger to Others: No Duty to Warn:no Physical Aggression / Violence:No  Access to Firearms a concern: No  Gang Involvement:No   Subjective: Pt present for face-to-face individual therapy via video.  Pt consents to telehealth video session due to COVID 19 pandemic. Location of pt: home Location of therapist: home office.   [place]  [place] a week which has been therapeutic for him. Pt talked about his wife being diagnosed with breast cancer.  Pt and wife are worried about it.  Pt's wife will have surgery to remove the lump/mass next week.     Pt talked about his son.  He is still struggling with the same issues.  His son is still not engaging in the help/treatment he needs. Addressed how this impacts pt.   Helped him process his feelings.  Pt is concerned about what his son will do when pt dies.   Worked on self care strategies.   Pt exercises every day which helps his mental health as well.   Provided supportive therapy.  Interventions: Cognitive Behavioral Therapy and Insight-Oriented  Diagnosis: F43.22  Plan of Care: Pt participated in setting treatment  goals.   He is progressing toward goals.   Plan to continue to meet every two weeks.   Treatment Plan  (Treatment Plan Target Date 10/22/2023) Client Abilities/Strengths  Pt is bright, engaging and motivated for therapy.  Client Treatment Preferences  Individual therapy.  Client Statement of Needs  Improve coping skills.  Symptoms  Autonomic hyperactivity (e.g., palpitations, shortness of breath, dry mouth, trouble swallowing, nausea, diarrhea). Excessive and/or unrealistic worry that is difficult to control occurring more days than not for at least 6 months about a number of events or activities. Hypervigilance (e.g., feeling constantly on edge, experiencing concentration difficulties, having trouble falling or staying asleep, exhibiting a general state of irritability). Motor tension (e.g., restlessness, tiredness, shakiness, muscle tension). Problems Addressed  Anxiety Goals 1. Enhance ability to effectively cope with the full variety of life's worries and anxieties. 2. Learn and implement coping skills that result in a reduction of anxiety and worry, and improved daily functioning. Objective Learn to accept limitations in life and commit to tolerating, rather than avoiding, unpleasant emotions while accomplishing meaningful goals. Target Date: 2023-10-22 Frequency: Biweekly Progress: 60 Modality: individual Related Interventions 1. Use techniques from Acceptance and Commitment Therapy to help client accept uncomfortable realities such as lack of complete control, imperfections, and uncertainty and tolerate unpleasant emotions and thoughts in order to accomplish value-consistent goals. Objective Learn and implement problem-solving strategies for realistically addressing worries. Target Date: 2023-10-22 Frequency: Biweekly Progress: 60 Modality: individual Related Interventions 1. Assign the client  a homework exercise in which he/she problem-solves a current problem.  review, reinforce  success, and provide corrective feedback toward improvement. 2. Teach the client problem-solving strategies involving specifically defining a problem, generating options for addressing it, evaluating the pros and cons of each option, selecting and implementing an optional action, and reevaluating and refining the action. Objective Learn and implement calming skills to reduce overall anxiety and manage anxiety symptoms. Target Date: 2023-10-22 Frequency: Biweekly Progress: 60 Modality: individual Related Interventions 1. Assign the client to read about progressive muscle relaxation and other calming strategies in relevant books or treatment manuals (e.g., Progressive Relaxation Training by Robb Matar and Alen Blew; Mastery of Your Anxiety and Worry: Workbook by Earlie Counts). 2. Assign the client homework each session in which he/she practices relaxation exercises daily, gradually applying them progressively from non-anxiety-provoking to anxiety-provoking situations; review and reinforce success while providing corrective feedback toward improvement. 3. Teach the client calming/relaxation skills (e.g., applied relaxation, progressive muscle relaxation, cue controlled relaxation; mindful breathing; biofeedback) and how to discriminate better between relaxation and tension; teach the client how to apply these skills to his/her daily life. 3. Reduce overall frequency, intensity, and duration of the anxiety so that daily functioning is not impaired. 4. Resolve the core conflict that is the source of anxiety. 5. Stabilize anxiety level while increasing ability to function on a daily basis. Diagnosis :    F43.22 Conditions For Discharge Achievement of treatment goals and objectives.  Aydon Swamy, LCSW

## 2023-01-14 ENCOUNTER — Ambulatory Visit (INDEPENDENT_AMBULATORY_CARE_PROVIDER_SITE_OTHER): Payer: Medicare Other | Admitting: Psychology

## 2023-01-14 DIAGNOSIS — F4322 Adjustment disorder with anxiety: Secondary | ICD-10-CM

## 2023-01-14 NOTE — Progress Notes (Signed)
[facility] Counselor/Therapist Progress Note  Patient ID: Gregory Wilkins, MRN: [id number],    Date: 01/14/2023  Time Spent: 9:00am-9:50am   50 minutes   Treatment Type: Individual Therapy  Reported Symptoms: stress  Mental Status Exam: Appearance:  Casual     Behavior: Appropriate  Motor: Normal  Speech/Language:  Normal Rate  Affect: Appropriate  Mood: normal  Thought process: normal  Thought content:   WNL  Sensory/Perceptual disturbances:   WNL  Orientation: oriented to person, place, time/date, and situation  Attention: Good  Concentration: Good  Memory: WNL  Fund of knowledge:  Good  Insight:   Good  Judgment:  Good  Impulse Control: Good   Risk Assessment: Danger to Self:  No Self-injurious Behavior: No Danger to Others: No Duty to Warn:no Physical Aggression / Violence:No  Access to Firearms a concern: No  Gang Involvement:No   Subjective: Pt present for face-to-face individual therapy via video.  Pt consents to telehealth video session and is aware of limitations of virtual sessions. Location of pt: home Location of therapist: home office.   Pt talked about his son being in rehab for the past week.   Pt's son will be there for 28 days.     Pt talked about his wife who had surgery for breast cancer but they did not get it all so she has to have repeat surgery and radiation.  Addressed pt's concerns about his wife.  Pt has had his two cataract surgeries and they went well.  He is now getting back to the gym.  He missed going when recuperating from surgery.   Pt talked about having a day the past two weeks when he felt really down bc of everything with his wife and son.   Helped pt process his feelings and worked on coping strategies.  Worked on self care strategies.   Provided supportive therapy.  Interventions: Cognitive Behavioral Therapy and Insight-Oriented  Diagnosis: F43.22  Plan of Care: Recommend ongoing therapy.  Pt participated  in setting treatment goals.   He is progressing toward goals.   Plan to continue to meet every two weeks.   Treatment Plan  (Treatment Plan Target Date 10/22/2023) Client Abilities/Strengths  Pt is bright, engaging and motivated for therapy.  Client Treatment Preferences  Individual therapy.  Client Statement of Needs  Improve coping skills.  Symptoms  Autonomic hyperactivity (e.g., palpitations, shortness of breath, dry mouth, trouble swallowing, nausea, diarrhea). Excessive and/or unrealistic worry that is difficult to control occurring more days than not for at least 6 months about a number of events or activities. Hypervigilance (e.g., feeling constantly on edge, experiencing concentration difficulties, having trouble falling or staying asleep, exhibiting a general state of irritability). Motor tension (e.g., restlessness, tiredness, shakiness, muscle tension). Problems Addressed  Anxiety Goals 1. Enhance ability to effectively cope with the full variety of life's worries and anxieties. 2. Learn and implement coping skills that result in a reduction of anxiety and worry, and improved daily functioning. Objective Learn to accept limitations in life and commit to tolerating, rather than avoiding, unpleasant emotions while accomplishing meaningful goals. Target Date: 2023-10-22 Frequency: Biweekly Progress: 60 Modality: individual Related Interventions 1. Use techniques from Acceptance and Commitment Therapy to help client accept uncomfortable realities such as lack of complete control, imperfections, and uncertainty and tolerate unpleasant emotions and thoughts in order to accomplish value-consistent goals. Objective Learn and implement problem-solving strategies for realistically addressing worries. Target Date: 2023-10-22 Frequency: Biweekly Progress: 60 Modality: individual Related  Interventions 1. Assign the client a homework exercise in which he/she problem-solves a current problem.   review, reinforce success, and provide corrective feedback toward improvement. 2. Teach the client problem-solving strategies involving specifically defining a problem, generating options for addressing it, evaluating the pros and cons of each option, selecting and implementing an optional action, and reevaluating and refining the action. Objective Learn and implement calming skills to reduce overall anxiety and manage anxiety symptoms. Target Date: 2023-10-22 Frequency: Biweekly Progress: 60 Modality: individual Related Interventions 1. Assign the client to read about progressive muscle relaxation and other calming strategies in relevant books or treatment manuals (e.g., Progressive Relaxation Training by Robb Matar and Alen Blew; Mastery of Your Anxiety and Worry: Workbook by Earlie Counts). 2. Assign the client homework each session in which he/she practices relaxation exercises daily, gradually applying them progressively from non-anxiety-provoking to anxiety-provoking situations; review and reinforce success while providing corrective feedback toward improvement. 3. Teach the client calming/relaxation skills (e.g., applied relaxation, progressive muscle relaxation, cue controlled relaxation; mindful breathing; biofeedback) and how to discriminate better between relaxation and tension; teach the client how to apply these skills to his/her daily life. 3. Reduce overall frequency, intensity, and duration of the anxiety so that daily functioning is not impaired. 4. Resolve the core conflict that is the source of anxiety. 5. Stabilize anxiety level while increasing ability to function on a daily basis. Diagnosis :    F43.22 Conditions For Discharge Achievement of treatment goals and objectives.  Gregory Gillson, LCSW

## 2023-01-19 ENCOUNTER — Other Ambulatory Visit: Payer: Self-pay | Admitting: Internal Medicine

## 2023-01-28 ENCOUNTER — Ambulatory Visit: Payer: Medicare Other | Admitting: Psychology

## 2023-02-15 ENCOUNTER — Encounter: Payer: Self-pay | Admitting: Internal Medicine

## 2023-02-17 ENCOUNTER — Ambulatory Visit: Payer: Medicare Other | Admitting: Internal Medicine

## 2023-02-25 ENCOUNTER — Ambulatory Visit (INDEPENDENT_AMBULATORY_CARE_PROVIDER_SITE_OTHER): Payer: Medicare Other | Admitting: Psychology

## 2023-02-25 DIAGNOSIS — F4322 Adjustment disorder with anxiety: Secondary | ICD-10-CM | POA: Diagnosis not present

## 2023-02-25 NOTE — Progress Notes (Signed)
[facility] Counselor/Therapist Progress Note  Patient ID: Gregory Wilkins, MRN: [id number],    Date: 02/25/2023  Time Spent: 9:00am-9:50am   50 minutes   Treatment Type: Individual Therapy  Reported Symptoms: stress  Mental Status Exam: Appearance:  Casual     Behavior: Appropriate  Motor: Normal  Speech/Language:  Normal Rate  Affect: Appropriate  Mood: normal  Thought process: normal  Thought content:   WNL  Sensory/Perceptual disturbances:   WNL  Orientation: oriented to person, place, time/date, and situation  Attention: Good  Concentration: Good  Memory: WNL  Fund of knowledge:  Good  Insight:   Good  Judgment:  Good  Impulse Control: Good   Risk Assessment: Danger to Self:  No Self-injurious Behavior: No Danger to Others: No Duty to Warn:no Physical Aggression / Violence:No  Access to Firearms a concern: No  Gang Involvement:No   Subjective: Pt present for face-to-face individual therapy via video.  Pt consents to telehealth video session and is aware of limitations of virtual sessions. Location of pt: home Location of therapist: home office.   Pt talked about his wife's cancer.   She had surgery and is now getting radiation.  It is a difficult time for her.  Addressed how this impacts pt.  Pt's son was in substance abuse treatment for 6 weeks.  He came home last week.  He has been sleeping on the couch bc he had torn up his room so pt and wife took his things out of the room.   Pt has had to set limits on his son.  Addressed their interactions and how they impacted pt.  Helped pt process his feelings and relationship dynamics. Worked on coping strategies.  Worked on self care strategies.   Provided supportive therapy.  Interventions: Cognitive Behavioral Therapy and Insight-Oriented  Diagnosis: F43.22  Plan of Care: Recommend ongoing therapy.  Pt participated in setting treatment goals.   He is progressing toward goals.   Plan to continue  to meet every two weeks.   Treatment Plan  (Treatment Plan Target Date 10/22/2023) Client Abilities/Strengths  Pt is bright, engaging and motivated for therapy.  Client Treatment Preferences  Individual therapy.  Client Statement of Needs  Improve coping skills.  Symptoms  Autonomic hyperactivity (e.g., palpitations, shortness of breath, dry mouth, trouble swallowing, nausea, diarrhea). Excessive and/or unrealistic worry that is difficult to control occurring more days than not for at least 6 months about a number of events or activities. Hypervigilance (e.g., feeling constantly on edge, experiencing concentration difficulties, having trouble falling or staying asleep, exhibiting a general state of irritability). Motor tension (e.g., restlessness, tiredness, shakiness, muscle tension). Problems Addressed  Anxiety Goals 1. Enhance ability to effectively cope with the full variety of life's worries and anxieties. 2. Learn and implement coping skills that result in a reduction of anxiety and worry, and improved daily functioning. Objective Learn to accept limitations in life and commit to tolerating, rather than avoiding, unpleasant emotions while accomplishing meaningful goals. Target Date: 2023-10-22 Frequency: Biweekly Progress: 60 Modality: individual Related Interventions 1. Use techniques from Acceptance and Commitment Therapy to help client accept uncomfortable realities such as lack of complete control, imperfections, and uncertainty and tolerate unpleasant emotions and thoughts in order to accomplish value-consistent goals. Objective Learn and implement problem-solving strategies for realistically addressing worries. Target Date: 2023-10-22 Frequency: Biweekly Progress: 60 Modality: individual Related Interventions 1. Assign the client a homework exercise in which he/she problem-solves a current problem.  review, reinforce success, and  provide corrective feedback toward  improvement. 2. Teach the client problem-solving strategies involving specifically defining a problem, generating options for addressing it, evaluating the pros and cons of each option, selecting and implementing an optional action, and reevaluating and refining the action. Objective Learn and implement calming skills to reduce overall anxiety and manage anxiety symptoms. Target Date: 2023-10-22 Frequency: Biweekly Progress: 60 Modality: individual Related Interventions 1. Assign the client to read about progressive muscle relaxation and other calming strategies in relevant books or treatment manuals (e.g., Progressive Relaxation Training by Robb Matar and Alen Blew; Mastery of Your Anxiety and Worry: Workbook by Earlie Counts). 2. Assign the client homework each session in which he/she practices relaxation exercises daily, gradually applying them progressively from non-anxiety-provoking to anxiety-provoking situations; review and reinforce success while providing corrective feedback toward improvement. 3. Teach the client calming/relaxation skills (e.g., applied relaxation, progressive muscle relaxation, cue controlled relaxation; mindful breathing; biofeedback) and how to discriminate better between relaxation and tension; teach the client how to apply these skills to his/her daily life. 3. Reduce overall frequency, intensity, and duration of the anxiety so that daily functioning is not impaired. 4. Resolve the core conflict that is the source of anxiety. 5. Stabilize anxiety level while increasing ability to function on a daily basis. Diagnosis :    F43.22 Conditions For Discharge Achievement of treatment goals and objectives.  [place]anyon Willow, LCSW

## 2023-03-03 ENCOUNTER — Other Ambulatory Visit: Payer: Self-pay | Admitting: Cardiology

## 2023-03-06 ENCOUNTER — Other Ambulatory Visit (HOSPITAL_BASED_OUTPATIENT_CLINIC_OR_DEPARTMENT_OTHER): Payer: Self-pay

## 2023-03-06 ENCOUNTER — Ambulatory Visit (INDEPENDENT_AMBULATORY_CARE_PROVIDER_SITE_OTHER): Payer: Medicare Other | Admitting: Internal Medicine

## 2023-03-06 VITALS — BP 122/59 | HR 49 | Temp 97.8°F | Resp 16 | Wt 201.0 lb

## 2023-03-06 DIAGNOSIS — G4733 Obstructive sleep apnea (adult) (pediatric): Secondary | ICD-10-CM

## 2023-03-06 DIAGNOSIS — E782 Mixed hyperlipidemia: Secondary | ICD-10-CM

## 2023-03-06 DIAGNOSIS — I2510 Atherosclerotic heart disease of native coronary artery without angina pectoris: Secondary | ICD-10-CM | POA: Diagnosis not present

## 2023-03-06 DIAGNOSIS — I10 Essential (primary) hypertension: Secondary | ICD-10-CM

## 2023-03-06 DIAGNOSIS — I2584 Coronary atherosclerosis due to calcified coronary lesion: Secondary | ICD-10-CM

## 2023-03-06 LAB — BASIC METABOLIC PANEL
BUN: 13 mg/dL (ref 6–23)
CO2: 28 mEq/L (ref 19–32)
Calcium: 9.4 mg/dL (ref 8.4–10.5)
Chloride: 101 mEq/L (ref 96–112)
Creatinine, Ser: 0.96 mg/dL (ref 0.40–1.50)
GFR: 74.86 mL/min (ref >60.00)
Glucose, Bld: 83 mg/dL (ref 70–99)
Potassium: 3.6 mEq/L (ref 3.5–5.1)
Sodium: 137 mEq/L (ref 135–145)

## 2023-03-06 MED ORDER — AREXVY 120 MCG/0.5ML IM SUSR
INTRAMUSCULAR | 0 refills | Status: DC
  Filled 2023-03-06: qty 0.5, 1d supply, fill #0

## 2023-03-06 NOTE — Progress Notes (Unsigned)
Subjective:    Patient ID: Gregory Wilkins, male    DOB: 12-Apr-1941, 82 y.o.   MRN: [id number]  DOS:  03/06/2023 Type of visit - description: f/u  Chronic medical problems addressed. Feels well.  Exercises more than before, no CP, SOB, DOE.  Admits to chronic cough, at baseline. No dysuria or gross hematuria.  Does have some urinary frequency.   Review of Systems See above   Past Medical History:  Diagnosis Date   Acne rosacea 12/23/2006   Qualifier: Diagnosis of  By: Alwyn Ren MD, Chrissie Noa   Dr Campbell Stall   Formatting of this note might be different from the original. Overview:  Qualifier: Diagnosis of  By: Alwyn Ren MD, Chrissie Noa   Dr Campbell Stall Overview:  Overview:  Qualifier: Diagnosis of  By: Alwyn Ren MD, Chrissie Noa   Dr Campbell Stall  Overview:  Qualifier: Diagnosis of  By: Alwyn Ren MD, Chrissie Noa   Dr Campbell Stall Overview:  Overview:  Overview:   Acute meniscal tear, lateral 10/01/2021   Alcohol abuse    Allergic rhinitis 12/23/2006   Qualifier: Diagnosis of  By: Alwyn Ren MD, Chrissie Noa   Onset:in high school Character: perennial but increased seasonally Triggers :cat, dust, pollen Allergy testing: Dr Jethro Bolus Maintenance medications/ response:Flonase/ Nasonex; saline wash; Allegra Smoking history:age 68- 42, up to 2 ppd Family history pulmonary disease: no    Formatting of this note might be different from the original. 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 Overview:  Qualifier: Diagnosis of  By: Alwyn Ren MD, Gae Dry of this note might be different from the original. Overview:  Qualifier: Diagnosis of  By: Alwyn Ren MD, William  IMO 10/01 Updates   Enlarged prostate without lower urinary tract symptoms (luts) 11/09/2008   Formatting of this note might be different from the original. Overview:  Qualifier: Diagnosis of  By: Alwyn Ren MD, Chrissie Noa   No PMH of elevated PSA ; no biopsy Overview:  Overview:  Qualifier: Diagnosis of  By: Alwyn Ren MD, Chrissie Noa   No PMH of elevated PSA ; no biopsy Overview:  Overview:  Overview:  Qualifier: Diagnosis of  By: Alwyn Ren MD, Chrissie Noa   No PMH of elevated PSA ; no biopsy   Essential (primary) hypertension 09/21/2007   Qualifier: Diagnosis of  By: Alwyn Ren MD, Gae Dry of this note might be different from the original. Overview:  Qualifier: Diagnosis of  By: Alwyn Ren MD, Chrissie Noa   Last Assessment & Plan:  Blood pressure appears to be well controlled. Renal function will be checked.  He has a minor cough; this does not appear to be significant enough to warrant change to losartan Overview:  Overview:  Qu   Family history of type C viral hepatitis 06/30/2012   Brother had liver transplant    Hepatitis A 04/2019   History of colonic polyps 12/23/2006   Qualifier: Diagnosis of  By: Alwyn Ren MD, Chrissie Noa   Dr Ritta Slot, GI Polypectomy X2 , 4/16 /2014. One was  transitional cell adenoma Due annually No FH colon cancer   Formatting of this note might be different from the original. Overview:  Qualifier: Diagnosis of  By: Alwyn Ren MD, Chrissie Noa   Dr Ritta Slot, GI Polypectomy X2 , 4/16 /2014. One was  transitional cell adenoma Due annually No FH colon ca   History of urinary stone 09/21/2007   Qualifier: Diagnosis of  By: Alwyn Ren MD,  Francene Finders , passed spontaneously No Urologist involvement   Formatting of this note might be different from the original. Overview:  Qualifier: Diagnosis of  By: Alwyn Ren MD, Francene Finders , passed spontaneously No Urologist involvement Overview:  Overview:  Qualifier: Diagnosis of  By: Alwyn Ren MD, Francene Finders , passed spontaneously No Urologist involvement    HYPERLIPIDEMIA 09/21/2007   Qualifier: Diagnosis of  By: Alwyn Ren MD, Lynder Parents Lipoprofile 2006 : LDL  108 ( 1072  / 575  ),  HDL 63 , Triglycerides  40. LDL goal = < 135 ; ideally < 105. No FH CAD   Formatting of this note might be different from the original. Overview:  Qualifier: Diagnosis of  By: Alwyn Ren MD, Lynder Parents Lipoprofile 2006 : LDL  108 ( 1072  / 575  ),  HDL 63 , Triglycerides  40. LDL goal = < 135 ; idea   Hypertension    Joint pain    Liver injury, initial encounter 05/17/2019   Macular degeneration 01/06/2018   Malignant neoplasm of skin    Mallet finger 2012     4th L DIP,Dr Sypher   Mixed dyslipidemia 03/23/2020   Nephrolithiasis    Nocturnal hypoxia 11/04/2019   Nuclear sclerotic cataract of both eyes    Obesity (BMI 30-39.9) 01/04/2014   OSA (obstructive sleep apnea) 05/17/2020   Osteoarthritis    Overweight 03/23/2020   PCP NOTES >>>>>>>>>>>>>>>> 05/26/2019   Personal history of other malignant neoplasm of skin 06/30/2012   Dr Karlyn Agee, Derm X3 Local resection from  right forearm and forehead.   Mohs surgery of the nose 06/2012, Dr Polly Cobia of this note might be different from the original. Overview:  Dr Karlyn Agee, Derm X3 Local resection from  right forearm and forehead.   Mohs surgery of the nose 06/2012, Dr Irene Limbo Overview:  Overview:  Dr Karlyn Agee, Derm X3 Local resection from  right forearm and fo   RLS (restless legs syndrome)    Sleep apnea    Snoring 02/01/2014   Formatting of this note might be different from the original. Last Assessment & Plan:  The patient has been noted to have  significant snoring, but no one has ever really commented on an abnormal breathing pattern during sleep. Although he is overweight and does have restless sleep, the rest of his history is not overly impressive for sleep disordered breathing. If he does have sleep apnea, I suspe    Past Surgical History:  Procedure Laterality Date   basal cell cancer     X 3   INGUINAL HERNIA REPAIR  [age]   right,left   OPEN REDUCTION INTERNAL FIXATION (ORIF) METACARPAL Right 05/19/2013  Procedure: OPEN REDUCTION INTERNAL FIXATION  RIGHT SMALL  METACARPAL FRACTURE;  Surgeon: Wyn Forster., MD;  Location: [facility];  Service: Orthopedics;  Laterality: Right;   POLYPECTOMY     X 3; Dr Kinnie Scales   WISDOM TOOTH EXTRACTION      Current Outpatient Medications  Medication Instructions   aspirin EC 81 mg, Oral, Daily, Swallow whole.   atorvastatin (LIPITOR) 10 mg, Oral, Daily at bedtime   cetirizine (ZYRTEC) 10 mg, Oral, Daily   cholecalciferol (VITAMIN D3) 1,000 Units, Oral, Daily   clonazePAM (KLONOPIN) 0.5 MG tablet TAKE 1 TABLET BY MOUTH EVERY NIGHT AT BEDTIME AS NEEDED FOR RESTLESS LEG   ELIQUIS 5 MG TABS tablet TAKE 1 TABLET BY MOUTH TWICE A DAY   hydrochlorothiazide (HYDRODIURIL) 12.5 mg, Oral, Daily   losartan (COZAAR) 25 mg, Oral, Daily, Patient needs an appointment for further refills. 2 nd attempt   metoprolol tartrate (LOPRESSOR) 12.5 mg, Oral, As needed   Multiple Vitamin (MULTIVITAMIN WITH MINERALS) TABS tablet 1 tablet, Oral, Daily   Multiple Vitamins-Minerals (ICAPS AREDS 2 PO) 1 tablet, Oral, Daily   RSV vaccine recomb adjuvanted (AREXVY) 120 MCG/0.5ML injection Intramuscular       Objective:   Physical Exam BP (!) 122/59 (BP Location: Right Arm, Patient Position: Sitting, Cuff Size: Small)   Pulse (!) 49   Temp 97.8 F (36.6 C) (Oral)   Resp 16   Wt 201 lb (91.2 kg)   SpO2 97%   BMI 29.68 kg/m  General:   Well developed, NAD, BMI noted. HEENT:   Normocephalic . Face symmetric, atraumatic Lungs:  CTA B Normal respiratory effort, no intercostal retractions, no accessory muscle use. Heart: RRR,  no murmur. Abdomen: Palpable liver, no bruit. Lower extremities: no pretibial edema bilaterally  Skin: Not pale. Not jaundice Neurologic:  alert & oriented X3.  Speech normal, gait appropriate for age and unassisted Psych--  Cognition and judgment appear intact.  Cooperative with normal attention span and concentration.  Behavior appropriate. No anxious or depressed appearing.      Assessment     Assessment (new patient 05/17/2019) HTN High Cholesterol Restless leg (clonazepam prn) Parox. A. Flutter DX 02-2020 H/o kidney stone Chronic LE edema L>R, mild  Hepatitis a, acute 04-2019 OSA  04-2020, on Cpap MSK: DJD, spinal stenosis  CT November 2022, incidental findings:  -Cirrhosis . H/o heavy drinking quit ~ 1991, hep B&C negative - R adrenal adenoma, -COPD (h/o tobacco) - coronary calcifications Vitamin D deficiency  PLAN Routine visit HTN: Ambulatory BPs all WNL.  Heart rate at home typically in the 60s or 70s.  Has a monitor, and nighttime heart rate drops in the 40s, this is chronic per pt, denies any symptoms. Check BMP, continue HCTZ, losartan.  Takes metoprolol as needed only CV: - Coronary calcifications: On aspirin, Lipitor  -Paroxysmal A-fib: Anticoagulated without apparent problems.  Heart rate seems regular today. - Has not seen cardiology lately.  Stable. OSA: On CPAP Cirrhosis: Last LFTs and platelets normal.  [place] 09-2022: Changes consistent with cirrhosis Preventive care reviewed Tdap 2022  PNM 13: 2017; PNM  2022 S/p shingrix x 2  Vaccines I recommend: RSV, COVID booster if not done in the last few months, flu shot (fall) Prostate ca screening: last PSA 2023, WNL.  Per guidelines no further screening CCS:  C-scope 2014: Polyps 2015: No polyps 03-21-2019: no cscope report found but path report --  Tubular adenoma.   Next scheduled for 03-2023 per pt .  H- POA: Recommend to bring a copy  RTC 4-5 m

## 2023-03-06 NOTE — Patient Instructions (Signed)
This is a summary of the advice provided today.  Please read if you have questions about today's visit.  You can always call us back for more information.  If you have MyChart, please use it. Let the APP send you alerts and  reminders.   If you are not planning to use MyChart, please deactivate it.  If you have it and don't use it, you won't see my comments about your results.  If I order blood work, x-rays or referrals and you do not see your results or don't hear about the referrals within a week:  please call my office.   Vaccines I recommend: RSV, COVID-vaccine, flu shot.  Continue checking your blood pressures regularly Blood pressure goal:  between 110/65 and  135/85. If it is consistently higher or lower, let me know      GO TO THE LAB : Get the blood work     GO TO THE FRONT DESK, PLEASE SCHEDULE YOUR APPOINTMENTS Come back for   checkup in 4 to 5 months    "Health Care Power of attorney" ,  "Living will" (Advance care planning documents)  If you already have a living will or healthcare power of attorney, is recommended you bring the copy to be scanned in your chart.   The document will be available to all the doctors you see in the system.  Advance care planning is a process that supports adults in  understanding and sharing their preferences regarding future medical care.  The patient's preferences are recorded in documents called Advance Directives and the can be modified at any time while the patient is in full mental capacity.   If you don't have one, please consider create one.      More information at: [email or web address]

## 2023-03-08 ENCOUNTER — Encounter: Payer: Self-pay | Admitting: Internal Medicine

## 2023-03-08 NOTE — Assessment & Plan Note (Signed)
  Preventive care reviewed Tdap 2022  PNM 13: 2017; PNM  2022 S/p shingrix x 2  Vaccines I recommend: RSV, COVID booster if not done in the last few months, flu shot (fall) Prostate ca screening: last PSA 2023, WNL.  Per guidelines no further screening CCS:  C-scope 2014: Polyps 2015: No polyps 03-21-2019: no cscope report found but path report -- Tubular adenoma.   Next scheduled for 03-2023 per pt . H- POA: Recommend to bring a copy

## 2023-03-08 NOTE — Assessment & Plan Note (Signed)
Routine visit HTN: Ambulatory BPs all WNL.  Heart rate at home typically in the 60s or 70s.  Has a monitor, and nighttime heart rate drops in the 40s, this is chronic per pt, denies any symptoms. Check BMP, continue HCTZ, losartan.  Takes metoprolol as needed only CV: - Coronary calcifications: On aspirin, Lipitor  -Paroxysmal A-fib: Anticoagulated without apparent problems.  Heart rate seems regular today. - Has not seen cardiology lately.  Stable. OSA: On CPAP Cirrhosis: Last LFTs and platelets normal.  [place] 09-2022: Changes consistent with cirrhosis Preventive care reviewed  RTC 4-5 m

## 2023-03-11 ENCOUNTER — Ambulatory Visit (INDEPENDENT_AMBULATORY_CARE_PROVIDER_SITE_OTHER): Payer: Medicare Other | Admitting: Psychology

## 2023-03-11 ENCOUNTER — Other Ambulatory Visit: Payer: Self-pay | Admitting: Cardiology

## 2023-03-11 ENCOUNTER — Other Ambulatory Visit: Payer: Self-pay | Admitting: Internal Medicine

## 2023-03-11 DIAGNOSIS — F4322 Adjustment disorder with anxiety: Secondary | ICD-10-CM | POA: Diagnosis not present

## 2023-03-11 NOTE — Progress Notes (Signed)
[facility] Counselor/Therapist Progress Note  Patient ID: TMOTHY PAPALEO, MRN: [id number],    Date: 03/11/2023  Time Spent: 9:00am-9:50am   50 minutes   Treatment Type: Individual Therapy  Reported Symptoms: stress  Mental Status Exam: Appearance:  Casual     Behavior: Appropriate  Motor: Normal  Speech/Language:  Normal Rate  Affect: Appropriate  Mood: normal  Thought process: normal  Thought content:   WNL  Sensory/Perceptual disturbances:   WNL  Orientation: oriented to person, place, time/date, and situation  Attention: Good  Concentration: Good  Memory: WNL  Fund of knowledge:  Good  Insight:   Good  Judgment:  Good  Impulse Control: Good   Risk Assessment: Danger to Self:  No Self-injurious Behavior: No Danger to Others: No Duty to Warn:no Physical Aggression / Violence:No  Access to Firearms a concern: No  Gang Involvement:No   Subjective: Pt present for face-to-face individual therapy via video.  Pt consents to telehealth video session and is aware of limitations of virtual sessions. Location of pt: home Location of therapist: home office.   Pt talked about "being a little too optimistic" about how his son is doing.  His son is sleeping on their couch.   He is clean and sober at this point but pt is bracing himself for the next downturn.   Helped pt process his feelings and relationship dynamics. Worked on coping strategies.  Pt talked about his wife.  She is 3/4 way through radiation treatment.   She is experiencing some fatigue but otherwise is doing ok and coping well. Pt talked about his tendency to not feel good enough.   Addressed how this is related to family of origin issues.   Worked on self care strategies.   Provided supportive therapy.  Interventions: Cognitive Behavioral Therapy and Insight-Oriented  Diagnosis: F43.22  Plan of Care: Recommend ongoing therapy.  Pt participated in setting treatment goals.   He is progressing  toward goals.   Plan to continue to meet every two weeks.   Treatment Plan  (Treatment Plan Target Date 10/22/2023) Client Abilities/Strengths  Pt is bright, engaging and motivated for therapy.  Client Treatment Preferences  Individual therapy.  Client Statement of Needs  Improve coping skills.  Symptoms  Autonomic hyperactivity (e.g., palpitations, shortness of breath, dry mouth, trouble swallowing, nausea, diarrhea). Excessive and/or unrealistic worry that is difficult to control occurring more days than not for at least 6 months about a number of events or activities. Hypervigilance (e.g., feeling constantly on edge, experiencing concentration difficulties, having trouble falling or staying asleep, exhibiting a general state of irritability). Motor tension (e.g., restlessness, tiredness, shakiness, muscle tension). Problems Addressed  Anxiety Goals 1. Enhance ability to effectively cope with the full variety of life's worries and anxieties. 2. Learn and implement coping skills that result in a reduction of anxiety and worry, and improved daily functioning. Objective Learn to accept limitations in life and commit to tolerating, rather than avoiding, unpleasant emotions while accomplishing meaningful goals. Target Date: 2023-10-22 Frequency: Biweekly Progress: 60 Modality: individual Related Interventions 1. Use techniques from Acceptance and Commitment Therapy to help client accept uncomfortable realities such as lack of complete control, imperfections, and uncertainty and tolerate unpleasant emotions and thoughts in order to accomplish value-consistent goals. Objective Learn and implement problem-solving strategies for realistically addressing worries. Target Date: 2023-10-22 Frequency: Biweekly Progress: 60 Modality: individual Related Interventions 1. Assign the client a homework exercise in which he/she problem-solves a current problem.  review, reinforce success,  and provide  corrective feedback toward improvement. 2. Teach the client problem-solving strategies involving specifically defining a problem, generating options for addressing it, evaluating the pros and cons of each option, selecting and implementing an optional action, and reevaluating and refining the action. Objective Learn and implement calming skills to reduce overall anxiety and manage anxiety symptoms. Target Date: 2023-10-22 Frequency: Biweekly Progress: 60 Modality: individual Related Interventions 1. Assign the client to read about progressive muscle relaxation and other calming strategies in relevant books or treatment manuals (e.g., Progressive Relaxation Training by Robb Matar and Alen Blew; Mastery of Your Anxiety and Worry: Workbook by Earlie Counts). 2. Assign the client homework each session in which he/she practices relaxation exercises daily, gradually applying them progressively from non-anxiety-provoking to anxiety-provoking situations; review and reinforce success while providing corrective feedback toward improvement. 3. Teach the client calming/relaxation skills (e.g., applied relaxation, progressive muscle relaxation, cue controlled relaxation; mindful breathing; biofeedback) and how to discriminate better between relaxation and tension; teach the client how to apply these skills to his/her daily life. 3. Reduce overall frequency, intensity, and duration of the anxiety so that daily functioning is not impaired. 4. Resolve the core conflict that is the source of anxiety. 5. Stabilize anxiety level while increasing ability to function on a daily basis. Diagnosis :    F43.22 Conditions For Discharge Achievement of treatment goals and objectives.  Nicklos Gaxiola, LCSW

## 2023-03-16 ENCOUNTER — Other Ambulatory Visit: Payer: Self-pay

## 2023-03-16 ENCOUNTER — Ambulatory Visit
Admission: RE | Admit: 2023-03-16 | Discharge: 2023-03-16 | Disposition: A | Discharge status: Home / Self Care | Payer: Medicare Other | Source: Ambulatory Visit | Attending: Family Medicine | Admitting: Family Medicine

## 2023-03-16 ENCOUNTER — Ambulatory Visit: Payer: Medicare Other

## 2023-03-16 VITALS — BP 124/75 | HR 77 | Temp 98.9°F | Resp 18

## 2023-03-16 DIAGNOSIS — J040 Acute laryngitis: Secondary | ICD-10-CM | POA: Diagnosis not present

## 2023-03-16 DIAGNOSIS — R059 Cough, unspecified: Secondary | ICD-10-CM | POA: Diagnosis not present

## 2023-03-16 DIAGNOSIS — J0100 Acute maxillary sinusitis, unspecified: Secondary | ICD-10-CM

## 2023-03-16 DIAGNOSIS — H1033 Unspecified acute conjunctivitis, bilateral: Secondary | ICD-10-CM | POA: Diagnosis not present

## 2023-03-16 MED ORDER — MOXIFLOXACIN HCL 0.5 % OP SOLN: 1.0000 [drp] | Freq: Three times a day (TID) | OPHTHALMIC | 0 refills | Status: AC

## 2023-03-16 MED ORDER — DOXYCYCLINE HYCLATE 100 MG PO CAPS: 100.0000 mg | ORAL_CAPSULE | Freq: Two times a day (BID) | ORAL | 0 refills | Status: DC

## 2023-03-16 MED ORDER — METHYLPREDNISOLONE 4 MG PO TBPK: ORAL_TABLET | ORAL | 0 refills | Status: DC

## 2023-03-16 NOTE — Discharge Instructions (Signed)
You were seen today for upper respiratory symptoms.  Your chest xray appears normal today.  However, once the radiologist reads it, if there is anything concerning we will call and notify you.  I have sent out an oral antibiotic and steroid to help with you sinus infection and cough.  I have sent out eye drops as well.  You may take over the counter claritin/zyrtec to help with sinus congestion and drainage as well.  If you have worsening symptoms or not improving then please return for re-evaluation.

## 2023-03-16 NOTE — ED Provider Notes (Signed)
[facility]    CSN: [id number] Arrival date & time: 03/16/23  1023      History   Chief Complaint Chief Complaint  Patient presents with   Cough    Congestion started approximately last MondayYesterday, constant coughing appears to have caused laryngitis. This morning congestion, cough, and laryngitis continue. Eyes lids were stuck together. - Entered by patient    HPI Gregory Wilkins is a 82 y.o. male.    Cough Associated symptoms: eye discharge and rhinorrhea   Associated symptoms: no chills, no fever, no shortness of breath and no sore throat   Patient is here with URI symptoms.  Started about 1 week ago with sinus congestion.  Symptoms worsened, thick, heavy drainage, and with a deep cough.  Thick sputum.  He has since lost his voice.  Some sinus pain and pressure in the sinuses.  Woke up today with goopy eyes.  Also some right ear pain/pressure.  No wheezing or sob, but has been less active.  No fevers.   Has tried otc medications without help.  No h/o copd that he is aware of.  Quit smoking 30 years ago.        Past Medical History:  Diagnosis Date   Acne rosacea 12/23/2006   Qualifier: Diagnosis of  By: Alwyn Ren MD, Chrissie Noa   Dr Campbell Stall   Formatting of this note might be different from the original. Overview:  Qualifier: Diagnosis of  By: Alwyn Ren MD, Chrissie Noa   Dr Campbell Stall Overview:  Overview:  Qualifier: Diagnosis of  By: Alwyn Ren MD, Chrissie Noa   Dr Campbell Stall  Overview:  Qualifier: Diagnosis of  By: Alwyn Ren MD, Chrissie Noa   Dr Campbell Stall Overview:  Overview:  Overview:   Acute meniscal tear, lateral 10/01/2021   Alcohol abuse    Allergic rhinitis 12/23/2006   Qualifier: Diagnosis of  By: Alwyn Ren MD, Chrissie Noa   Onset:in high school Character: perennial but increased seasonally Triggers :cat, dust, pollen Allergy testing: Dr Jethro Bolus Maintenance medications/ response:Flonase/ Nasonex; saline wash; Allegra Smoking history:age 68- 42, up to 2 ppd  Family history pulmonary disease: no    Formatting of this note might be different from the original. 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 Overview:  Qualifier: Diagnosis of  By: Alwyn Ren MD, Gae Dry of this note might be different from the original. Overview:  Qualifier: Diagnosis of  By: Alwyn Ren MD, William  IMO 10/01 Updates   Enlarged prostate without lower urinary tract symptoms (luts) 11/09/2008   Formatting of this note might be different from the original. Overview:  Qualifier: Diagnosis of  By: Alwyn Ren MD, Chrissie Noa   No PMH  of elevated PSA ; no biopsy Overview:  Overview:  Qualifier: Diagnosis of  By: Alwyn Ren MD, Chrissie Noa   No PMH of elevated PSA ; no biopsy Overview:  Overview:  Overview:  Qualifier: Diagnosis of  By: Alwyn Ren MD, Chrissie Noa   No PMH of elevated PSA ; no biopsy   Essential (primary) hypertension 09/21/2007   Qualifier: Diagnosis of  By: Alwyn Ren MD, Gae Dry of this note might be different from the original. Overview:  Qualifier: Diagnosis of  By: Alwyn Ren MD, Chrissie Noa   Last Assessment & Plan:  Blood pressure appears to be well controlled. Renal function will be checked.  He has a minor cough; this does not appear to be significant enough to  warrant change to losartan Overview:  Overview:  Qu   Family history of type C viral hepatitis 06/30/2012   Brother had liver transplant    Hepatitis A 04/2019   History of colonic polyps 12/23/2006   Qualifier: Diagnosis of  By: Alwyn Ren MD, Chrissie Noa   Dr Ritta Slot, GI Polypectomy X2 , 4/16 /2014. One was  transitional cell adenoma Due annually No FH colon cancer   Formatting of this note might be different from the original. Overview:  Qualifier: Diagnosis of  By: Alwyn Ren MD, Chrissie Noa   Dr Ritta Slot, GI Polypectomy X2 , 4/16 /2014. One was  transitional cell adenoma Due annually No FH colon ca   History of urinary stone 09/21/2007   Qualifier: Diagnosis of  By: Alwyn Ren MD, Francene Finders , passed spontaneously No Urologist involvement   Formatting of this note might be different from the original. Overview:  Qualifier: Diagnosis of  By: Alwyn Ren MD, Francene Finders , passed spontaneously No Urologist involvement Overview:  Overview:  Qualifier: Diagnosis of  By: Alwyn Ren MD, Francene Finders , passed spontaneously No Urologist involvement    HYPERLIPIDEMIA 09/21/2007   Qualifier: Diagnosis of  By: Alwyn Ren MD, Lynder Parents Lipoprofile 2006 : LDL  108 ( 1072  / 575  ),  HDL 63 , Triglycerides  40. LDL goal = < 135 ; ideally < 105. No FH CAD   Formatting of this note might be different from the original. Overview:  Qualifier: Diagnosis of  By: Alwyn Ren MD, Lynder Parents Lipoprofile 2006 : LDL  108 ( 1072  / 575  ),  HDL 63 , Triglycerides  40. LDL goal = < 135 ; idea   Hypertension    Joint pain    Liver injury, initial encounter 05/17/2019   Macular degeneration 01/06/2018   Malignant neoplasm of skin    Mallet finger 2012     4th L DIP,Dr Sypher   Mixed dyslipidemia 03/23/2020   Nephrolithiasis    Nocturnal hypoxia 11/04/2019   Nuclear sclerotic cataract of both eyes    Obesity (BMI 30-39.9) 01/04/2014   OSA (obstructive sleep apnea) 05/17/2020   Osteoarthritis    Overweight 03/23/2020   PCP NOTES  >>>>>>>>>>>>>>>> 05/26/2019   Personal history of other malignant neoplasm of skin 06/30/2012   Dr Karlyn Agee, Derm X3 Local resection from  right forearm and forehead.   Mohs surgery of the nose 06/2012, Dr Polly Cobia of this note might be different from the original. Overview:  Dr Karlyn Agee, Derm X3 Local resection from  right forearm and forehead.   Mohs surgery of the nose 06/2012, Dr Irene Limbo Overview:  Overview:  Dr Karlyn Agee, Derm X3 Local resection from  right  forearm and fo   RLS (restless legs syndrome)    Sleep apnea    Snoring 02/01/2014   Formatting of this note might be different from the original. Last Assessment & Plan:  The patient has been noted to have significant snoring, but no one has ever really commented on an abnormal breathing pattern during sleep. Although he is overweight and does have restless sleep, the rest of his history is not overly impressive for sleep disordered breathing. 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   Coronary artery calcification 10/12/2020   Alcohol abuse    BPH (benign prostatic hyperplasia)    Chest pain    Hypertension    Joint pain    Malignant neoplasm of skin    Nephrolithiasis    Nuclear sclerotic cataract of both eyes    Osteoarthritis    OSA on CPAP 05/17/2020   Atrial flutter, paroxysmal (HCC) 03/23/2020   Annual physical exam 01/25/2020   Nocturnal hypoxia 11/04/2019   PCP NOTES >>>>>>>>>>>>>>>> 05/26/2019   Liver injury, initial encounter 05/17/2019   Hepatitis A 04/2019   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   Anxiety 01/18/2018   Macular degeneration 01/06/2018   Obesity (BMI 30-39.9) 01/04/2014   Blood donor 01/03/2014   Colonic polyp 11/10/2012   Personal history of other malignant neoplasm of skin 06/30/2012    RLS (restless legs syndrome) 06/30/2012   Family history of type C viral hepatitis 06/30/2012   Mallet finger 2012   Diverticulosis of colon 11/09/2008   Enlarged prostate without lower urinary tract symptoms (luts) 11/09/2008   HYPERLIPIDEMIA 09/21/2007   Essential (primary) hypertension 09/21/2007   History of urinary stone 09/21/2007   Allergic rhinitis 12/23/2006   Acne rosacea 12/23/2006   History of colonic polyps 12/23/2006    Past Surgical History:  Procedure Laterality Date   basal cell cancer     X 3   INGUINAL HERNIA REPAIR  [age]   right,left   OPEN REDUCTION INTERNAL FIXATION (ORIF) METACARPAL Right 05/19/2013   Procedure: OPEN REDUCTION INTERNAL FIXATION  RIGHT SMALL  METACARPAL FRACTURE;  Surgeon: Wyn Forster., MD;  Location: [facility];  Service: Orthopedics;  Laterality: Right;   POLYPECTOMY     X 3; Dr Kinnie Scales   WISDOM TOOTH EXTRACTION         Home Medications    Prior to Admission medications   Medication Sig Start Date End Date Taking? Authorizing Provider  aspirin EC 81 MG tablet Take 1 tablet (81 mg total) by mouth daily. Swallow whole. 10/12/20  Yes Revankar, Aundra Dubin, MD  atorvastatin (LIPITOR) 10 MG tablet Take 1 tablet (10 mg total) by mouth at bedtime. 01/19/23  Yes Paz, Nolon Rod, MD  cetirizine (ZYRTEC) 10 MG tablet Take 10 mg by mouth daily.   Yes [provider]  cholecalciferol (VITAMIN D3) 25 MCG (1000 UNIT) tablet Take 1,000 Units by mouth daily. 05/28/22  Yes [provider]  clonazePAM (KLONOPIN) 0.5 MG tablet TAKE 1 TABLET BY MOUTH EVERY NIGHT AT BEDTIME AS NEEDED FOR RESTLESS LEG 07/14/22  Yes Paz, Elita Quick E, MD  ELIQUIS 5 MG TABS tablet TAKE 1 TABLET BY MOUTH TWICE A DAY 09/18/22  Yes Revankar, Aundra Dubin, MD  hydrochlorothiazide (HYDRODIURIL) 12.5 MG tablet TAKE 1 TABLET BY  MOUTH EVERY DAY 03/11/23  Yes Wanda Plump, MD  losartan (COZAAR) 25 MG tablet Take 1 tablet (25 mg total) by mouth daily. Patient needs an  appointment for further refills. 2 nd attempt 03/03/23  Yes Revankar, Aundra Dubin, MD  Multiple Vitamins-Minerals (ICAPS AREDS 2 PO) Take 1 tablet by mouth daily in the afternoon.   Yes [provider]  metoprolol tartrate (LOPRESSOR) 50 MG tablet Take 12.5 mg by mouth as needed for heart rate control (fast heart rate).    [provider]  Multiple Vitamin (MULTIVITAMIN WITH MINERALS) TABS tablet Take 1 tablet by mouth daily.    [provider]  RSV vaccine recomb adjuvanted (AREXVY) 120 MCG/0.5ML injection Inject into the muscle. 03/06/23   Judyann Munson, MD    Family History Family History  Problem Relation Age of Onset   Stroke Mother 78   Hypertension Mother    Alcohol abuse Mother    Obesity Mother    Diabetes Father    Kidney disease Father    Heart disease Neg Hx    Cancer Neg Hx     Social History Social History   Tobacco Use   Smoking status: Former    Current packs/day: 0.00    Average packs/day: 2.0 packs/day for 26.0 years (52.0 ttl pk-yrs)    Types: Cigarettes    Start date: 07/29/1956    Quit date: 07/29/1982    Years since quitting: 40.6   Smokeless tobacco: Never   Tobacco comments:    ages [age], up to 2 ppd  Vaping Use   Vaping status: Never Used  Substance Use Topics   Alcohol use: No    Comment: quit 1997   Drug use: No     Allergies   Penicillins and Ramipril   Review of Systems Review of Systems  Constitutional:  Positive for activity change. Negative for chills and fever.  HENT:  Positive for congestion, rhinorrhea and sinus pressure. Negative for sore throat.   Eyes:  Positive for discharge and redness.  Respiratory:  Positive for cough. Negative for shortness of breath.   Cardiovascular: Negative.   Gastrointestinal: Negative.   Genitourinary: Negative.   Musculoskeletal: Negative.   Psychiatric/Behavioral: Negative.       Physical Exam Triage Vital Signs ED Triage Vitals  Encounter Vitals Group     BP 03/16/23  1046 124/75     Systolic BP Percentile --      Diastolic BP Percentile --      Pulse Rate 03/16/23 1046 77     Resp 03/16/23 1046 18     Temp 03/16/23 1046 98.9 F (37.2 C)     Temp Source 03/16/23 1046 Oral     SpO2 03/16/23 1046 94 %     Weight --      Height --      Head Circumference --      Peak Flow --      Pain Score 03/16/23 1041 3     Pain Loc --      Pain Education --      Exclude from Growth Chart --    No data found.  Updated Vital Signs BP 124/75 (BP Location: Right Arm)   Pulse 77   Temp 98.9 F (37.2 C) (Oral)   Resp 18   SpO2 94%   Visual Acuity Right Eye Distance:   Left Eye Distance:   Bilateral Distance:    Right Eye Near:   Left Eye Near:  Bilateral Near:     Physical Exam Constitutional:      Appearance: Normal appearance. He is normal weight.  HENT:     Right Ear: There is impacted cerumen.     Left Ear: There is impacted cerumen.     Nose: Congestion and rhinorrhea present.     Right Sinus: Maxillary sinus tenderness present.     Left Sinus: Maxillary sinus tenderness present.  Eyes:     General:        Right eye: Discharge present.        Left eye: Discharge present. Cardiovascular:     Rate and Rhythm: Normal rate and regular rhythm.  Pulmonary:     Effort: Pulmonary effort is normal.     Breath sounds: Wheezing present.     Comments: Very slight end expiratory wheezes at the bases Musculoskeletal:     Cervical back: Normal range of motion and neck supple. No tenderness.  Skin:    General: Skin is warm.  Neurological:     General: No focal deficit present.     Mental Status: He is alert.  Psychiatric:        Mood and Affect: Mood normal.      UC Treatments / Results  Labs (all labs ordered are listed, but only abnormal results are displayed) Labs Reviewed - No data to display  EKG   Radiology No results found.  Procedures Procedures (including critical care time)  Medications Ordered in UC Medications - No  data to display  Initial Impression / Assessment and Plan / UC Course  I have reviewed the triage vital signs and the nursing notes.  Pertinent labs & imaging results that were available during my care of the patient were reviewed by me and considered in my medical decision making (see chart for details).  Patient was seen today for URI symptoms for over a week.  Chest xray appears normal, although radiologist has not read this as of yet.  Will call patient with any positive results.  I have sent out doxy and medrol dose pack for sinus and chest issues.  Vigamox for eye symptoms.  Covid testing deferred as vitals stable and symptoms started over a week ago.  Advised to call/return if symptoms are not improving/worsening.    Final Clinical Impressions(s) / UC Diagnoses   Final diagnoses:  Acute bacterial conjunctivitis of both eyes  Laryngitis  Acute non-recurrent maxillary sinusitis     Discharge Instructions      You were seen today for upper respiratory symptoms.  Your chest xray appears normal today.  However, once the radiologist reads it, if there is anything concerning we will call and notify you.  I have sent out an oral antibiotic and steroid to help with you sinus infection and cough.  I have sent out eye drops as well.  You may take over the counter claritin/zyrtec to help with sinus congestion and drainage as well.  If you have worsening symptoms or not improving then please return for re-evaluation.     ED Prescriptions     Medication Sig Dispense Auth. Provider   moxifloxacin (VIGAMOX) 0.5 % ophthalmic solution Place 1 drop into both eyes 3 (three) times daily for 7 days. 3 mL Braylyn Kalter, MD   doxycycline (VIBRAMYCIN) 100 MG capsule Take 1 capsule (100 mg total) by mouth 2 (two) times daily. 20 capsule Tenecia Ignasiak, MD   methylPREDNISolone (MEDROL DOSEPAK) 4 MG TBPK tablet Take as directed 1 each Leocadio Heal,  MD      PDMP not reviewed this encounter.    Jannifer Franklin, MD 03/16/23 [phone]

## 2023-03-16 NOTE — ED Triage Notes (Signed)
Congestion , productive cough, right ear ache, laryngitis over the last week. Treating sx with otc meds at home

## 2023-03-18 ENCOUNTER — Other Ambulatory Visit (HOSPITAL_BASED_OUTPATIENT_CLINIC_OR_DEPARTMENT_OTHER): Payer: Self-pay

## 2023-03-24 ENCOUNTER — Ambulatory Visit
Admission: RE | Admit: 2023-03-24 | Discharge: 2023-03-24 | Disposition: A | Discharge status: Home / Self Care | Payer: Medicare Other | Source: Ambulatory Visit | Attending: Internal Medicine | Admitting: Internal Medicine

## 2023-03-24 VITALS — BP 127/60 | Temp 97.8°F | Resp 18

## 2023-03-24 DIAGNOSIS — H6123 Impacted cerumen, bilateral: Secondary | ICD-10-CM

## 2023-03-24 NOTE — ED Provider Notes (Signed)
[facility]  Note:  This document was prepared using Conservation officer, historic buildings and may include unintentional dictation errors.  MRN: [id number] DOB: [birth year]  Subjective:   Gregory Wilkins is a 82 y.o. male presenting for 1 week history of persistent bilateral ear fullness and earwax buildup.  Patient is currently getting treated for sinus infection, has a few days left of his antibiotic.  No history of earwax problems.  No dizziness, pain, fever, spontaneous drainage.  No current facility-administered medications for this encounter.  Current Outpatient Medications:    aspirin EC 81 MG tablet, Take 1 tablet (81 mg total) by mouth daily. Swallow whole., Disp: 90 tablet, Rfl: 3   atorvastatin (LIPITOR) 10 MG tablet, Take 1 tablet (10 mg total) by mouth at bedtime., Disp: 90 tablet, Rfl: 1   cetirizine (ZYRTEC) 10 MG tablet, Take 10 mg by mouth daily., Disp: , Rfl:    cholecalciferol (VITAMIN D3) 25 MCG (1000 UNIT) tablet, Take 1,000 Units by mouth daily., Disp: , Rfl:    clonazePAM (KLONOPIN) 0.5 MG tablet, TAKE 1 TABLET BY MOUTH EVERY NIGHT AT BEDTIME AS NEEDED FOR RESTLESS LEG, Disp: 90 tablet, Rfl: 1   doxycycline (VIBRAMYCIN) 100 MG capsule, Take 1 capsule (100 mg total) by mouth 2 (two) times daily., Disp: 20 capsule, Rfl: 0   ELIQUIS 5 MG TABS tablet, TAKE 1 TABLET BY MOUTH TWICE A DAY, Disp: 180 tablet, Rfl: 1   hydrochlorothiazide (HYDRODIURIL) 12.5 MG tablet, TAKE 1 TABLET BY MOUTH EVERY DAY, Disp: 90 tablet, Rfl: 1   losartan (COZAAR) 25 MG tablet, Take 1 tablet (25 mg total) by mouth daily. Patient needs an appointment for further refills. 2 nd attempt, Disp: 15 tablet, Rfl: 0   methylPREDNISolone (MEDROL DOSEPAK) 4 MG TBPK tablet, Take as directed, Disp: 1 each, Rfl: 0   metoprolol tartrate (LOPRESSOR) 50 MG tablet, Take 12.5 mg by mouth as needed for heart rate control (fast heart rate)., Disp: , Rfl:    Multiple Vitamin (MULTIVITAMIN WITH  MINERALS) TABS tablet, Take 1 tablet by mouth daily., Disp: , Rfl:    Multiple Vitamins-Minerals (ICAPS AREDS 2 PO), Take 1 tablet by mouth daily in the afternoon., Disp: , Rfl:    RSV vaccine recomb adjuvanted (AREXVY) 120 MCG/0.5ML injection, Inject into the muscle., Disp: 0.5 mL, Rfl: 0   Allergies  Allergen Reactions   Penicillins Hives    hives   Ramipril Anaphylaxis and Swelling    Past Medical History:  Diagnosis Date   Acne rosacea 12/23/2006   Qualifier: Diagnosis of  By: Alwyn Ren MD, Chrissie Noa   Dr Campbell Stall   Formatting of this note might be different from the original. Overview:  Qualifier: Diagnosis of  By: Alwyn Ren MD, Chrissie Noa   Dr Campbell Stall Overview:  Overview:  Qualifier: Diagnosis of  By: Alwyn Ren MD, Chrissie Noa   Dr Campbell Stall  Overview:  Qualifier: Diagnosis of  By: Alwyn Ren MD, Chrissie Noa   Dr Campbell Stall Overview:  Overview:  Overview:   Acute meniscal tear, lateral 10/01/2021   Alcohol abuse    Allergic rhinitis 12/23/2006   Qualifier: Diagnosis of  By: Alwyn Ren MD, Chrissie Noa   Onset:in high school Character: perennial but increased seasonally Triggers :cat, dust, pollen Allergy testing: Dr Jethro Bolus Maintenance medications/ response:Flonase/ Nasonex; saline wash; Allegra Smoking history:age 46- 42, up to 2 ppd Family history pulmonary disease: no    Formatting of this note might be different from the original. 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 Overview:  Qualifier: Diagnosis of  By: Alwyn Ren MD, Gae Dry of this note might be different from the original. Overview:  Qualifier: Diagnosis of  By: Alwyn Ren MD, William  IMO 10/01 Updates   Enlarged prostate without lower urinary tract symptoms (luts) 11/09/2008   Formatting of this note might be different from the original. Overview:  Qualifier: Diagnosis of  By: Alwyn Ren MD, Chrissie Noa   No PMH of elevated PSA ; no biopsy Overview:  Overview:  Qualifier: Diagnosis of  By: Alwyn Ren MD, Chrissie Noa   No PMH of elevated PSA ; no biopsy Overview:  Overview:  Overview:  Qualifier: Diagnosis of  By: Alwyn Ren MD, Chrissie Noa   No PMH of elevated PSA ; no biopsy   Essential (primary) hypertension 09/21/2007   Qualifier: Diagnosis of  By: Alwyn Ren MD, Gae Dry of this note might be different from the original. Overview:  Qualifier: Diagnosis of  By: Alwyn Ren MD, Chrissie Noa   Last Assessment & Plan:  Blood pressure appears to be well controlled. Renal function will be checked.  He has a minor cough; this does not appear to be significant enough to warrant change to losartan Overview:  Overview:  Qu   Family history of type C viral hepatitis 06/30/2012   Brother had liver transplant    Hepatitis A 04/2019   History of colonic polyps 12/23/2006   Qualifier: Diagnosis of  By: Alwyn Ren MD, Chrissie Noa   Dr Ritta Slot, GI Polypectomy X2 , 4/16 /2014. One was  transitional cell adenoma Due annually No FH colon cancer   Formatting of this note might be different from the original. Overview:  Qualifier: Diagnosis of  By: Alwyn Ren MD, Chrissie Noa   Dr Ritta Slot, GI Polypectomy X2 , 4/16 /2014. One was  transitional cell adenoma Due annually No FH colon ca   History of urinary stone 09/21/2007   Qualifier: Diagnosis of  By: Alwyn Ren MD,  Francene Finders , passed spontaneously No Urologist involvement   Formatting of this note might be different from the original. Overview:  Qualifier: Diagnosis of  By: Alwyn Ren MD, Francene Finders , passed spontaneously No Urologist involvement Overview:  Overview:  Qualifier: Diagnosis of  By: Alwyn Ren MD, Francene Finders , passed spontaneously No Urologist involvement    HYPERLIPIDEMIA 09/21/2007   Qualifier: Diagnosis of  By: Alwyn Ren MD, Lynder Parents Lipoprofile 2006 : LDL  108 ( 1072  / 575  ),  HDL 63 , Triglycerides  40. LDL goal = < 135 ; ideally < 105. No FH CAD   Formatting of this note might be different from the original. Overview:  Qualifier: Diagnosis of  By: Alwyn Ren MD, Lynder Parents Lipoprofile 2006 : LDL  108 ( 1072  / 575  ),  HDL 63 , Triglycerides  40. LDL goal = < 135 ; idea   Hypertension    Joint pain    Liver injury, initial encounter 05/17/2019   Macular degeneration 01/06/2018   Malignant neoplasm of skin    Mallet finger 2012     4th L DIP,Dr Sypher   Mixed dyslipidemia 03/23/2020   Nephrolithiasis    Nocturnal hypoxia 11/04/2019   Nuclear sclerotic cataract of both eyes    Obesity (BMI 30-39.9) 01/04/2014   OSA (obstructive sleep apnea) 05/17/2020   Osteoarthritis    Overweight 03/23/2020   PCP NOTES >>>>>>>>>>>>>>>> 05/26/2019   Personal history of other malignant neoplasm of skin 06/30/2012   Dr Karlyn Agee, Derm X3 Local resection from  right forearm and forehead.   Mohs surgery of the nose 06/2012, Dr Polly Cobia of this note might be different from the original. Overview:  Dr Karlyn Agee, Derm X3 Local resection from  right forearm and forehead.   Mohs surgery of the nose 06/2012, Dr Irene Limbo Overview:  Overview:  Dr Karlyn Agee, Derm X3 Local resection from  right forearm and fo   RLS (restless legs syndrome)    Sleep apnea    Snoring 02/01/2014   Formatting of this note might be different from the original. Last Assessment & Plan:  The patient has been noted to have  significant snoring, but no one has ever really commented on an abnormal breathing pattern during sleep. Although he is overweight and does have restless sleep, the rest of his history is not overly impressive for sleep disordered breathing. If he does have sleep apnea, I suspe     Past Surgical History:  Procedure Laterality Date   basal cell cancer     X 3   INGUINAL HERNIA REPAIR  [age]   right,left   OPEN REDUCTION INTERNAL FIXATION (ORIF) METACARPAL Right 05/19/2013   Procedure: OPEN REDUCTION INTERNAL FIXATION  RIGHT SMALL  METACARPAL FRACTURE;  Surgeon: Wyn Forster., MD;  Location: [facility];  Service: Orthopedics;  Laterality: Right;   POLYPECTOMY     X 3; Dr Kinnie Scales   WISDOM TOOTH EXTRACTION      Family History  Problem Relation Age of Onset   Stroke Mother 31   Hypertension Mother    Alcohol abuse Mother    Obesity Mother    Diabetes Father    Kidney disease Father    Heart disease Neg Hx    Cancer Neg Hx     Social History   Tobacco Use   Smoking status: Former    Current packs/day: 0.00    Average packs/day: 2.0 packs/day for 26.0 years (52.0 ttl pk-yrs)    Types: Cigarettes    Start date: 07/29/1956    Quit date: 07/29/1982    Years since quitting: 40.6   Smokeless tobacco: Never   Tobacco comments:    ages [age], up to 2 ppd  Vaping Use   Vaping status: Never Used  Substance Use Topics   Alcohol use: No    Comment: quit 1997   Drug use: No    ROS   Objective:   Vitals: BP 127/60 (BP Location: Right Arm)   Temp 97.8 F (36.6 C) (Oral)   Resp 18   SpO2 95%   Physical Exam Constitutional:      General:  He is not in acute distress.    Appearance: Normal appearance. He is well-developed and normal weight. He is not ill-appearing, toxic-appearing or diaphoretic.  HENT:     Head: Normocephalic and atraumatic.     Right Ear: Tympanic membrane, ear canal and external ear normal. There is impacted cerumen.     Left Ear:  Tympanic membrane, ear canal and external ear normal. There is impacted cerumen.     Ears:     Comments: TMs clear thereafter.    Nose: Nose normal.     Mouth/Throat:     Pharynx: Oropharynx is clear.  Eyes:     General: No scleral icterus.       Right eye: No discharge.        Left eye: No discharge.     Extraocular Movements: Extraocular movements intact.  Cardiovascular:     Rate and Rhythm: Normal rate.  Pulmonary:     Effort: Pulmonary effort is normal.  Musculoskeletal:     Cervical back: Normal range of motion.  Neurological:     Mental Status: He is alert and oriented to person, place, and time.  Psychiatric:        Mood and Affect: Mood normal.        Behavior: Behavior normal.        Thought Content: Thought content normal.        Judgment: Judgment normal.    Ear lavage performed using mixture of peroxide and water.  Pressure irrigation performed using a bottle and a thin ear tube.  Bilateral ear lavage.  No curette was used.   Assessment and Plan :   PDMP not reviewed this encounter.  1. Bilateral impacted cerumen    Successful bilateral ear lavage.  General management of cerumen impaction reviewed with patient.  Anticipatory guidance provided. Counseled patient on potential for adverse effects with medications prescribed/recommended today, ER and return-to-clinic precautions discussed, patient verbalized understanding.    Wallis Bamberg, [place] 03/24/23 1333

## 2023-03-24 NOTE — ED Triage Notes (Signed)
Pt reports ear wax buildup and feels he has fluid in the right ear x 1 week. Reports he was treated for sinus infection last week. Pt was told he has wax in right ear.

## 2023-03-25 ENCOUNTER — Ambulatory Visit (INDEPENDENT_AMBULATORY_CARE_PROVIDER_SITE_OTHER): Payer: Medicare Other | Admitting: Psychology

## 2023-03-25 DIAGNOSIS — F4322 Adjustment disorder with anxiety: Secondary | ICD-10-CM | POA: Diagnosis not present

## 2023-03-25 NOTE — Progress Notes (Signed)
[facility] Counselor/Therapist Progress Note  Patient ID: Gregory Wilkins, MRN: [id number],    Date: 03/25/2023  Time Spent: 9:00am-9:50am   50 minutes   Treatment Type: Individual Therapy  Reported Symptoms: stress  Mental Status Exam: Appearance:  Casual     Behavior: Appropriate  Motor: Normal  Speech/Language:  Normal Rate  Affect: Appropriate  Mood: normal  Thought process: normal  Thought content:   WNL  Sensory/Perceptual disturbances:   WNL  Orientation: oriented to person, place, time/date, and situation  Attention: Good  Concentration: Good  Memory: WNL  Fund of knowledge:  Good  Insight:   Good  Judgment:  Good  Impulse Control: Good   Risk Assessment: Danger to Self:  No Self-injurious Behavior: No Danger to Others: No Duty to Warn:no Physical Aggression / Violence:No  Access to Firearms a concern: No  Gang Involvement:No   Subjective: Pt present for face-to-face individual therapy via video.  Pt consents to telehealth video session and is aware of limitations and benefits of virtual sessions. Location of pt: home Location of therapist: home office.   Pt talked about his wife who is traveling in [place] this week.   She is feeling well enough to travel now.   Pt has been reflecting on his past.   He loved working and never really wanted to retire.   He does not have grand dreams of doing things like traveling.  He feels content with his day to day life of reading, exercising.  Pt believes the universe gives him what he needs when he needs it.   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  Addressed the potential challenges.  Worked on self care strategies.   Provided supportive therapy.  Interventions: Cognitive Behavioral Therapy and Insight-Oriented  Diagnosis: F43.22  Plan of Care: Recommend ongoing therapy.  Pt participated in setting treatment goals.   He is progressing toward goals.   Plan to continue to meet every two  weeks.   Treatment Plan  (Treatment Plan Target Date 10/22/2023) Client Abilities/Strengths  Pt is bright, engaging and motivated for therapy.  Client Treatment Preferences  Individual therapy.  Client Statement of Needs  Improve coping skills.  Symptoms  Autonomic hyperactivity (e.g., palpitations, shortness of breath, dry mouth, trouble swallowing, nausea, diarrhea). Excessive and/or unrealistic worry that is difficult to control occurring more days than not for at least 6 months about a number of events or activities. Hypervigilance (e.g., feeling constantly on edge, experiencing concentration difficulties, having trouble falling or staying asleep, exhibiting a general state of irritability). Motor tension (e.g., restlessness, tiredness, shakiness, muscle tension). Problems Addressed  Anxiety Goals 1. Enhance ability to effectively cope with the full variety of life's worries and anxieties. 2. Learn and implement coping skills that result in a reduction of anxiety and worry, and improved daily functioning. Objective Learn to accept limitations in life and commit to tolerating, rather than avoiding, unpleasant emotions while accomplishing meaningful goals. Target Date: 2023-10-22 Frequency: Biweekly Progress: 60 Modality: individual Related Interventions 1. Use techniques from Acceptance and Commitment Therapy to help client accept uncomfortable realities such as lack of complete control, imperfections, and uncertainty and tolerate unpleasant emotions and thoughts in order to accomplish value-consistent goals. Objective Learn and implement problem-solving strategies for realistically addressing worries. Target Date: 2023-10-22 Frequency: Biweekly Progress: 60 Modality: individual Related Interventions 1. Assign the client a homework exercise in which he/she problem-solves a current problem.  review, reinforce success, and provide corrective feedback toward improvement. 2.  Teach the  client problem-solving strategies involving specifically defining a problem, generating options for addressing it, evaluating the pros and cons of each option, selecting and implementing an optional action, and reevaluating and refining the action. Objective Learn and implement calming skills to reduce overall anxiety and manage anxiety symptoms. Target Date: 2023-10-22 Frequency: Biweekly Progress: 60 Modality: individual Related Interventions 1. Assign the client to read about progressive muscle relaxation and other calming strategies in relevant books or treatment manuals (e.g., Progressive Relaxation Training by Robb Matar and Alen Blew; Mastery of Your Anxiety and Worry: Workbook by Earlie Counts). 2. Assign the client homework each session in which he/she practices relaxation exercises daily, gradually applying them progressively from non-anxiety-provoking to anxiety-provoking situations; review and reinforce success while providing corrective feedback toward improvement. 3. Teach the client calming/relaxation skills (e.g., applied relaxation, progressive muscle relaxation, cue controlled relaxation; mindful breathing; biofeedback) and how to discriminate better between relaxation and tension; teach the client how to apply these skills to his/her daily life. 3. Reduce overall frequency, intensity, and duration of the anxiety so that daily functioning is not impaired. 4. Resolve the core conflict that is the source of anxiety. 5. Stabilize anxiety level while increasing ability to function on a daily basis. Diagnosis :    F43.22 Conditions For Discharge Achievement of treatment goals and objectives.  Uchenna Seufert, LCSW

## 2023-03-31 ENCOUNTER — Encounter: Payer: Self-pay | Admitting: Cardiology

## 2023-03-31 ENCOUNTER — Ambulatory Visit: Payer: Medicare Other | Attending: Cardiology | Admitting: Cardiology

## 2023-03-31 VITALS — BP 122/58 | HR 53 | Ht 69.0 in | Wt 203.8 lb

## 2023-03-31 DIAGNOSIS — E782 Mixed hyperlipidemia: Secondary | ICD-10-CM | POA: Diagnosis present

## 2023-03-31 DIAGNOSIS — I2510 Atherosclerotic heart disease of native coronary artery without angina pectoris: Secondary | ICD-10-CM

## 2023-03-31 DIAGNOSIS — I10 Essential (primary) hypertension: Secondary | ICD-10-CM | POA: Diagnosis present

## 2023-03-31 DIAGNOSIS — G4733 Obstructive sleep apnea (adult) (pediatric): Secondary | ICD-10-CM | POA: Diagnosis present

## 2023-03-31 DIAGNOSIS — I2584 Coronary atherosclerosis due to calcified coronary lesion: Secondary | ICD-10-CM | POA: Diagnosis present

## 2023-03-31 NOTE — Progress Notes (Signed)
Cardiology Office Note:    Date:  03/31/2023   ID:  Gregory Wilkins, DOB Mar 30, 1941, MRN [id number]  PCP:  Wanda Plump, MD  Cardiologist:  Garwin Brothers, MD   Referring MD: Wanda Plump, MD    ASSESSMENT:    1. Mixed dyslipidemia   2. Coronary artery calcification   3. Essential (primary) hypertension   4. OSA on CPAP   5. HYPERLIPIDEMIA    PLAN:    In order of problems listed above:  Coronary artery calcification: Secondary prevention stressed with the patient.  Importance of compliance with diet medication stressed and vocalized understanding.  He is doing excellent with his exercise. Essential hypertension: Blood pressure stable and diet was emphasized.  Lifestyle modification urged. Mixed dyslipidemia: On lipid-lowering medications followed by primary care.  Lipids are fine and I discussed with him. Obstructive sleep apnea: Sleep health issues were discussed. Patient will be seen in follow-up appointment in 6 months or earlier if the patient has any concerns.    Medication Adjustments/Labs and Tests Ordered: Current medicines are reviewed at length with the patient today.  Concerns regarding medicines are outlined above.  Orders Placed This Encounter  Procedures   EKG 12-Lead   No orders of the defined types were placed in this encounter.    No chief complaint on file.    History of Present Illness:    Gregory Wilkins is a 82 y.o. male.  Patient has past medical history of essential hypertension, mixed dyslipidemia, paroxysmal atrial fibrillation.  He denies any problems at this time and takes care of activities of daily living.  No chest pain orthopnea or PND.  He exercises very well on a regular basis.  He denies any palpitations or any recent symptoms.  At the time of my evaluation, the patient is alert awake oriented and in no distress.  Past Medical History:  Diagnosis Date   Acne rosacea 12/23/2006   Qualifier: Diagnosis of  By: Alwyn Ren MD, Chrissie Noa    Dr Campbell Stall   Formatting of this note might be different from the original. Overview:  Qualifier: Diagnosis of  By: Alwyn Ren MD, Chrissie Noa   Dr Campbell Stall Overview:  Overview:  Qualifier: Diagnosis of  By: Alwyn Ren MD, Chrissie Noa   Dr Campbell Stall  Overview:  Qualifier: Diagnosis of  By: Alwyn Ren MD, Chrissie Noa   Dr Campbell Stall Overview:  Overview:  Overview:   Acute meniscal tear, lateral 10/01/2021   Alcohol abuse    Allergic rhinitis 12/23/2006   Qualifier: Diagnosis of  By: Alwyn Ren MD, Chrissie Noa   Onset:in high school Character: perennial but increased seasonally Triggers :cat, dust, pollen Allergy testing: Dr Jethro Bolus Maintenance medications/ response:Flonase/ Nasonex; saline wash; Allegra Smoking history:age 89- 42, up to 2 ppd Family history pulmonary disease: no    Formatting of this note might be different from the original. 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 Overview:  Qualifier: Diagnosis of  By: Alwyn Ren MD, Gae Dry of this note might be different from the original. Overview:  Qualifier: Diagnosis of  By: Alwyn Ren MD, Duard Brady 10/01 Updates   Enlarged prostate without lower urinary tract symptoms (luts) 11/09/2008   Formatting of this note might be different from the original. Overview:  Qualifier: Diagnosis of  By: Alwyn Ren MD, Chrissie Noa   No PMH of elevated PSA ; no biopsy Overview:  Overview:   Qualifier: Diagnosis of  By: Alwyn Ren MD, Chrissie Noa   No PMH of elevated PSA ; no biopsy Overview:  Overview:  Overview:  Qualifier: Diagnosis of  By: Alwyn Ren MD, Chrissie Noa   No PMH of elevated PSA ; no biopsy   Essential (primary) hypertension 09/21/2007   Qualifier: Diagnosis of  By: Alwyn Ren MD, Gae Dry of this note might be different from the original. Overview:  Qualifier: Diagnosis of  By: Alwyn Ren MD, Chrissie Noa   Last Assessment & Plan:  Blood pressure appears to be well controlled. Renal function will be checked.  He has a minor cough; this does not appear to be significant enough to warrant change to losartan Overview:  Overview:  Qu   Family history of type C viral hepatitis 06/30/2012   Brother had liver transplant    Hepatitis A 04/2019   History of colonic polyps 12/23/2006   Qualifier: Diagnosis of  By: Alwyn Ren MD, Chrissie Noa   Dr Ritta Slot, GI Polypectomy X2 , 4/16 /2014. One was  transitional cell adenoma Due annually No FH colon cancer   Formatting of this note might be different from the original. Overview:  Qualifier: Diagnosis of  By: Alwyn Ren MD, Chrissie Noa   Dr Ritta Slot, GI Polypectomy X2 , 4/16 /2014. One was  transitional cell adenoma Due annually No FH colon ca   History of urinary stone 09/21/2007   Qualifier: Diagnosis of  By: Alwyn Ren MD, Francene Finders , passed spontaneously No Urologist involvement   Formatting of this note might be different from the original. Overview:  Qualifier: Diagnosis of  By: Alwyn Ren MD, Francene Finders , passed spontaneously No Urologist involvement Overview:  Overview:  Qualifier: Diagnosis of  By: Alwyn Ren MD, Francene Finders , passed spontaneously No Urologist involvement    HYPERLIPIDEMIA 09/21/2007   Qualifier: Diagnosis of  By: Alwyn Ren MD, Lynder Parents Lipoprofile 2006 : LDL  108 ( 1072  / 575  ),  HDL 63 , Triglycerides  40. LDL goal = < 135 ; ideally < 105. No FH CAD   Formatting of this note might be different from the original. Overview:  Qualifier:  Diagnosis of  By: Alwyn Ren MD, Lynder Parents Lipoprofile 2006 : LDL  108 ( 1072  / 575  ),  HDL 63 , Triglycerides  40. LDL goal = < 135 ; idea   Hypertension    Joint pain    Liver injury, initial encounter 05/17/2019   Macular degeneration 01/06/2018   Malignant neoplasm of skin    Mallet finger 2012     4th L DIP,Dr Sypher   Nephrolithiasis    Nocturnal hypoxia 11/04/2019   Nuclear sclerotic cataract of both eyes    Obesity (BMI 30-39.9) 01/04/2014   OSA on CPAP 05/17/2020   Osteoarthritis    PCP NOTES >>>>>>>>>>>>>>>> 05/26/2019   Personal history of other malignant neoplasm of skin 06/30/2012   Dr  Karlyn Agee, Derm X3 Local resection from  right forearm and forehead.   Mohs surgery of the nose 06/2012, Dr Polly Cobia of this note might be different from the original. Overview:  Dr Karlyn Agee, Derm X3 Local resection from  right forearm and forehead.   Mohs surgery of the nose 06/2012, Dr Irene Limbo Overview:  Overview:  Dr Karlyn Agee, Derm X3 Local resection from  right forearm and fo   RLS (restless legs syndrome)     Past Surgical History:  Procedure Laterality Date   basal cell cancer     X 3   INGUINAL HERNIA REPAIR  [age]   right,left   OPEN REDUCTION INTERNAL FIXATION (ORIF) METACARPAL Right 05/19/2013   Procedure: OPEN REDUCTION INTERNAL FIXATION  RIGHT SMALL  METACARPAL FRACTURE;  Surgeon: Wyn Forster., MD;  Location: [facility];  Service: Orthopedics;  Laterality: Right;   POLYPECTOMY     X 3; Dr Kinnie Scales   WISDOM TOOTH EXTRACTION      Current Medications: Current Meds  Medication Sig   aspirin EC 81 MG tablet Take 1 tablet (81 mg total) by mouth daily. Swallow whole.   atorvastatin (LIPITOR) 10 MG tablet Take 1 tablet (10 mg total) by mouth at bedtime.   cetirizine (ZYRTEC) 10 MG tablet Take 10 mg by mouth daily.   cholecalciferol (VITAMIN D3) 25 MCG (1000 UNIT) tablet Take 1,000 Units by mouth daily.   clonazePAM (KLONOPIN) 0.5 MG tablet  TAKE 1 TABLET BY MOUTH EVERY NIGHT AT BEDTIME AS NEEDED FOR RESTLESS LEG   ELIQUIS 5 MG TABS tablet TAKE 1 TABLET BY MOUTH TWICE A DAY   hydrochlorothiazide (HYDRODIURIL) 12.5 MG tablet TAKE 1 TABLET BY MOUTH EVERY DAY   losartan (COZAAR) 25 MG tablet Take 1 tablet (25 mg total) by mouth daily. Patient needs an appointment for further refills. 2 nd attempt   metoprolol tartrate (LOPRESSOR) 50 MG tablet Take 12.5 mg by mouth as needed for heart rate control (fast heart rate).   Multiple Vitamin (MULTIVITAMIN WITH MINERALS) TABS tablet Take 1 tablet by mouth daily.   Multiple Vitamins-Minerals (ICAPS AREDS 2 PO) Take 1 tablet by mouth daily in the afternoon.     Allergies:   Penicillins and Ramipril   Social History   Socioeconomic History   Marital status: Married    Spouse name: Gregory Wilkins    Number of children: Not on file   Years of education: Not on file   Highest education level: Master's degree (e.g., MA, MS, MEng, MEd, MSW, MBA)  Occupational History   Occupation: retired---Financial Analyst  Tobacco Use   Smoking status: Former    Current packs/day: 0.00    Average packs/day: 2.0 packs/day for 26.0 years (52.0 ttl pk-yrs)    Types: Cigarettes    Start date: 07/29/1956    Quit date: 07/29/1982    Years since quitting: 40.6   Smokeless tobacco: Never   Tobacco comments:    ages [age], up to 2 ppd  Vaping Use   Vaping status: Never Used  Substance and Sexual Activity   Alcohol use: No    Comment: quit 1997   Drug use: No   Sexual activity: Not on file  Other Topics Concern   Not on file  Social History Narrative   Household: pt, wife, child   Social Determinants of Health   Financial Resource Strain: Low Risk  (03/04/2023)   Overall Financial Resource Strain (CARDIA)    Difficulty of Paying Living  Expenses: Not very hard  Food Insecurity: Low Risk  (03/17/2023)   Received from [facility]   Hunger Vital Sign    Worried About Running Out of Food in the Last Year: Never  true    Ran Out of Food in the Last Year: Never true  Transportation Needs: Not on file (03/17/2023)  Physical Activity: Sufficiently Active (03/04/2023)   Exercise Vital Sign    Days of Exercise per Week: 6 days    Minutes of Exercise per Session: 60 min  Stress: No Stress Concern Present (03/04/2023)   [employer] of Occupational Health - Occupational Stress Questionnaire    Feeling of Stress : Only a little  Social Connections: Moderately Isolated (03/04/2023)   Social Connection and Isolation Panel [NHANES]    Frequency of Communication with Friends and Family: Once a week    Frequency of Social Gatherings with Friends and Family: Once a week    Attends Religious Services: 1 to 4 times per year    Active Member of [employer] or Organizations: No    Attends Engineer, structural: Never    Marital Status: Married     Family History: The patient's family history includes Alcohol abuse in his mother; Diabetes in his father; Hypertension in his mother; Kidney disease in his father; Obesity in his mother; Stroke (age of onset: 70) in his mother. There is no history of Heart disease or Cancer.  ROS:   Please see the history of present illness.    All other systems reviewed and are negative.  EKGs/Labs/Other Studies Reviewed:    The following studies were reviewed today: .Marland KitchenEKG Interpretation Date/Time:  Tuesday March 31 2023 08:15:26 EDT Ventricular Rate:  53 PR Interval:  144 QRS Duration:  98 QT Interval:  422 QTC Calculation: 395 R Axis:   39  Text Interpretation: Sinus bradycardia Septal infarct , age undetermined Abnormal ECG When compared with ECG of 04-Mar-2020 23:33, PREVIOUS ECG IS PRESENT Confirmed by Belva Crome [phone]) on 03/31/2023 9:11:05 AM       Recent Labs: 10/07/2022: ALT 16; Hemoglobin 14.6; Platelets 208.0 03/06/2023: BUN 13; Creatinine, Ser 0.96; Potassium 3.6; Sodium 137  Recent Lipid Panel    Component Value Date/Time   CHOL 137 10/07/2022  0835   CHOL 130 08/26/2021 0853   TRIG 40.0 10/07/2022 0835   HDL 73.30 10/07/2022 0835   HDL 67 08/26/2021 0853   CHOLHDL 2 10/07/2022 0835   VLDL 8.0 10/07/2022 0835   LDLCALC 56 10/07/2022 0835   LDLCALC 54 08/26/2021 0853   LDLDIRECT 124.6 11/10/2008 0000    Physical Exam:    VS:  BP (!) 122/58   Pulse (!) 53   Ht 5\' 9"  (1.753 m)   Wt 203 lb 12.8 oz (92.4 kg)   SpO2 96%   BMI 30.10 kg/m     Wt Readings from Last 3 Encounters:  03/31/23 203 lb 12.8 oz (92.4 kg)  03/06/23 201 lb (91.2 kg)  10/07/22 196 lb 4 oz (89 kg)     GEN: Patient is in no acute distress HEENT: Normal NECK: No JVD; No carotid bruits LYMPHATICS: No lymphadenopathy CARDIAC: Hear sounds regular, 2/6 systolic murmur at the apex. RESPIRATORY:  Clear to auscultation without rales, wheezing or rhonchi  ABDOMEN: Soft, non-tender, non-distended MUSCULOSKELETAL:  No edema; No deformity  SKIN: Warm and dry NEUROLOGIC:  Alert and oriented x 3 PSYCHIATRIC:  Normal affect   Signed, Garwin Brothers, MD  03/31/2023 9:12 AM    [facility]

## 2023-03-31 NOTE — Patient Instructions (Signed)

## 2023-04-05 ENCOUNTER — Other Ambulatory Visit: Payer: Self-pay | Admitting: Cardiology

## 2023-04-05 DIAGNOSIS — I4892 Unspecified atrial flutter: Secondary | ICD-10-CM

## 2023-04-06 NOTE — Telephone Encounter (Signed)
Prescription refill request for Eliquis received. Indication:aflutter Last office visit:9/24 Scr:0.82  8/24 Age: 82 Weight:92.4  kg  Prescription refilled

## 2023-04-08 ENCOUNTER — Ambulatory Visit (INDEPENDENT_AMBULATORY_CARE_PROVIDER_SITE_OTHER): Payer: Medicare Other | Admitting: Psychology

## 2023-04-08 DIAGNOSIS — F4322 Adjustment disorder with anxiety: Secondary | ICD-10-CM

## 2023-04-08 NOTE — Progress Notes (Signed)
[facility] Counselor/Therapist Progress Note  Patient ID: Gregory Wilkins, MRN: [id number],    Date: 04/08/2023  Time Spent: 9:00am-9:50am   50 minutes   Treatment Type: Individual Therapy  Reported Symptoms: stress  Mental Status Exam: Appearance:  Casual     Behavior: Appropriate  Motor: Normal  Speech/Language:  Normal Rate  Affect: Appropriate  Mood: normal  Thought process: normal  Thought content:   WNL  Sensory/Perceptual disturbances:   WNL  Orientation: oriented to person, place, time/date, and situation  Attention: Good  Concentration: Good  Memory: WNL  Fund of knowledge:  Good  Insight:   Good  Judgment:  Good  Impulse Control: Good   Risk Assessment: Danger to Self:  No Self-injurious Behavior: No Danger to Others: No Duty to Warn:no Physical Aggression / Violence:No  Access to Firearms a concern: No  Gang Involvement:No   Subjective: Pt present for face-to-face individual therapy via video.  Pt consents to telehealth video session and is aware of limitations and benefits of virtual sessions. Location of pt: home Location of therapist: home office.   Pt talked about  being in a "bad place" emotionally yesterday.   He felt so down that he did not even go to exercise.  Pt was feeling badly about himself.    It has also been very tense at home bc of issues with his son and wife.   Addressed the issues and helped pt process his feelings.  He had an argument with his wife and felt badly about how he handled it.  Pt has had negative thoughts about not fitting in.  Worked on self compassion and giving himself grace.  Worked on self care strategies.   Provided supportive therapy.  Interventions: Cognitive Behavioral Therapy and Insight-Oriented  Diagnosis: F43.22  Plan of Care: Recommend ongoing therapy.  Pt participated in setting treatment goals.   He is progressing toward goals.   Plan to continue to meet every two weeks.   Treatment  Plan  (Treatment Plan Target Date 10/22/2023) Client Abilities/Strengths  Pt is bright, engaging and motivated for therapy.  Client Treatment Preferences  Individual therapy.  Client Statement of Needs  Improve coping skills.  Symptoms  Autonomic hyperactivity (e.g., palpitations, shortness of breath, dry mouth, trouble swallowing, nausea, diarrhea). Excessive and/or unrealistic worry that is difficult to control occurring more days than not for at least 6 months about a number of events or activities. Hypervigilance (e.g., feeling constantly on edge, experiencing concentration difficulties, having trouble falling or staying asleep, exhibiting a general state of irritability). Motor tension (e.g., restlessness, tiredness, shakiness, muscle tension). Problems Addressed  Anxiety Goals 1. Enhance ability to effectively cope with the full variety of life's worries and anxieties. 2. Learn and implement coping skills that result in a reduction of anxiety and worry, and improved daily functioning. Objective Learn to accept limitations in life and commit to tolerating, rather than avoiding, unpleasant emotions while accomplishing meaningful goals. Target Date: 2023-10-22 Frequency: Biweekly Progress: 60 Modality: individual Related Interventions 1. Use techniques from Acceptance and Commitment Therapy to help client accept uncomfortable realities such as lack of complete control, imperfections, and uncertainty and tolerate unpleasant emotions and thoughts in order to accomplish value-consistent goals. Objective Learn and implement problem-solving strategies for realistically addressing worries. Target Date: 2023-10-22 Frequency: Biweekly Progress: 60 Modality: individual Related Interventions 1. Assign the client a homework exercise in which he/she problem-solves a current problem.  review, reinforce success, and provide corrective feedback toward improvement. 2. Teach  the client problem-solving  strategies involving specifically defining a problem, generating options for addressing it, evaluating the pros and cons of each option, selecting and implementing an optional action, and reevaluating and refining the action. Objective Learn and implement calming skills to reduce overall anxiety and manage anxiety symptoms. Target Date: 2023-10-22 Frequency: Biweekly Progress: 60 Modality: individual Related Interventions 1. Assign the client to read about progressive muscle relaxation and other calming strategies in relevant books or treatment manuals (e.g., Progressive Relaxation Training by Robb Matar and Alen Blew; Mastery of Your Anxiety and Worry: Workbook by Earlie Counts). 2. Assign the client homework each session in which he/she practices relaxation exercises daily, gradually applying them progressively from non-anxiety-provoking to anxiety-provoking situations; review and reinforce success while providing corrective feedback toward improvement. 3. Teach the client calming/relaxation skills (e.g., applied relaxation, progressive muscle relaxation, cue controlled relaxation; mindful breathing; biofeedback) and how to discriminate better between relaxation and tension; teach the client how to apply these skills to his/her daily life. 3. Reduce overall frequency, intensity, and duration of the anxiety so that daily functioning is not impaired. 4. Resolve the core conflict that is the source of anxiety. 5. Stabilize anxiety level while increasing ability to function on a daily basis. Diagnosis :    F43.22 Conditions For Discharge Achievement of treatment goals and objectives.  Teron Blais, LCSW

## 2023-04-09 ENCOUNTER — Ambulatory Visit (INDEPENDENT_AMBULATORY_CARE_PROVIDER_SITE_OTHER): Payer: Medicare Other | Admitting: Primary Care

## 2023-04-09 ENCOUNTER — Encounter: Payer: Self-pay | Admitting: Primary Care

## 2023-04-09 VITALS — BP 116/62 | HR 53 | Ht 70.0 in | Wt 205.0 lb

## 2023-04-09 DIAGNOSIS — G4734 Idiopathic sleep related nonobstructive alveolar hypoventilation: Secondary | ICD-10-CM | POA: Diagnosis not present

## 2023-04-09 DIAGNOSIS — G4733 Obstructive sleep apnea (adult) (pediatric): Secondary | ICD-10-CM

## 2023-04-09 NOTE — Patient Instructions (Addendum)
Great seeing you Gregory Wilkins  CPAP download showed great compliance Sleep apnea is well-controlled on current pressure setting No changes Continue to wear CPAP nightly 4 to 6 hours or longer Continue Zyrtec 10 mg daily and Mucinex as needed  Follow-up 1 year with [facility] NP or sooner

## 2023-04-09 NOTE — Assessment & Plan Note (Signed)
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  No indication for maintenance bronchodilators.

## 2023-04-09 NOTE — Assessment & Plan Note (Signed)
-   Stable;Continue Cetirizine 10mg  daily, ocean saline nasal spray twice daily and prn mucinex

## 2023-04-09 NOTE — Assessment & Plan Note (Addendum)
-   Hx severe OSA, AHI 57/hour. Maintained on CPAP. He is 93% compliant with use over the last 30 days.  Average usage 5 hours 11 minutes.  Current pressure 9 cm H2O; Residual AHI 4.3/hour.  No changes today.  Advised patient continue to wear CPAP nightly.  Encouraged side sleeping position and weight loss efforts.  Follow-up in 1 year or sooner if needed.

## 2023-04-09 NOTE — Progress Notes (Signed)
@Patient  ID: Gregory Wilkins, male    DOB: November 25, 1940, 82 y.o.   MRN: [id number]  Chief Complaint  Patient presents with   Follow-up    Cpap f/u    Referring provider: Wanda Plump, MD  HPI: 82 year old male, former smoker quit 1984 (52-pack-year history).  Past medical history significant for obstructive sleep apnea, nocturnal hypoxia, allergic rhinitis, hypertension, a flutter, mixed dyslipidemia, restless leg syndrome, skin cancer, obesity.  Patient of Dr. Vassie Loll, seen for initial consult on 11/04/2019.    Home sleep study 04/03/20 showed severe obstructive sleep apnea, AHI 57/hr.  CPAP titration completed 05/17/20, recommended pressure 9 cm H20 with no oxygen requirement.    04/09/2023- interim hx  Hx severe OSA, AHI 57/hour  Patient is doing well He is compliant with CPAP use  Current pressure 9cm h20; Residual AHI 4.3  He is getting between 5-6 hours of sleep a night He may take a nap after dinner He will at times wake up in the middle of the night, he has a very active mind which will occasionally keep him up  He takes clonazepam on average twice a month as needed for RLS He has seasonal allergies. Triggers are cats, dust and pollen  Maintained on Cetirizine 10mg  daily, ocean saline nasal spray twice daily and prn mucinex   Reactive health compliance report 03/10/2023 - 04/08/2023 Usage days 28/30 days (93%); 24 days (80%) greater than 4 hours Average usage 5 hours 11 minutes Pressure 9 cm H2O (ramp/initial pressure 4 cm H2O) Average AHI 4.3  Allergies  Allergen Reactions   Penicillins Hives    hives   Ramipril Anaphylaxis and Swelling    Immunization History  Administered Date(s) Administered   Covid-19, Mrna,Vaccine(Spikevax)57yrs and older 04/01/2023   Fluad Quad(high Dose 65+) 05/15/2021, 05/06/2022   Influenza Split 05/28/2013   Influenza, High Dose Seasonal PF 06/02/2014, 04/08/2018, 03/07/2019, 04/17/2020, 04/01/2023   Influenza, Seasonal, Injecte,  Preservative Fre 06/30/2012   Influenza,inj,Quad PF,6+ Mos 04/19/2015   PFIZER Comirnaty(Gray Top)Covid-19 Tri-Sucrose Vaccine 11/21/2020, 05/06/2022   PFIZER(Purple Top)SARS-COV-2 Vaccination 08/10/2019, 08/30/2019, 04/24/2020   Pfizer Covid-19 Vaccine Bivalent Booster 80yrs & up 07/05/2021, 01/30/2022   Pneumococcal Conjugate-13 04/29/2016   Pneumococcal Polysaccharide-23 05/09/2010, 08/28/2020   Respiratory Syncytial Virus Vaccine,Recomb Aduvanted(Arexvy) 03/06/2023   Td 12/26/2005, 01/09/2011   Tdap 02/26/2021   Zoster Recombinant(Shingrix) 08/26/2018, 03/07/2019   Zoster, Live 11/09/2008    Past Medical History:  Diagnosis Date   Acne rosacea 12/23/2006   Qualifier: Diagnosis of  By: Alwyn Ren MD, Chrissie Noa   Dr Campbell Stall   Formatting of this note might be different from the original. Overview:  Qualifier: Diagnosis of  By: Alwyn Ren MD, Chrissie Noa   Dr Campbell Stall Overview:  Overview:  Qualifier: Diagnosis of  By: Alwyn Ren MD, Chrissie Noa   Dr Campbell Stall  Overview:  Qualifier: Diagnosis of  By: Alwyn Ren MD, Chrissie Noa   Dr Campbell Stall Overview:  Overview:  Overview:   Acute meniscal tear, lateral 10/01/2021   Alcohol abuse    Allergic rhinitis 12/23/2006   Qualifier: Diagnosis of  By: Alwyn Ren MD, Chrissie Noa   Onset:in high school Character: perennial but increased seasonally Triggers :cat, dust, pollen Allergy testing: Dr Jethro Bolus Maintenance medications/ response:Flonase/ Nasonex; saline wash; Allegra Smoking history:age 73- 42, up to 2 ppd Family history pulmonary disease: no    Formatting of this note might be different from the original. 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 Overview:  Qualifier: Diagnosis of  By: Alwyn Ren MD, Gae Dry of this note might be different from the original. Overview:  Qualifier: Diagnosis of  By: Alwyn Ren MD, William  IMO 10/01 Updates   Enlarged prostate without lower urinary tract symptoms (luts) 11/09/2008   Formatting of this note might be different from the original. Overview:  Qualifier: Diagnosis of  By: Alwyn Ren MD, Chrissie Noa   No PMH of elevated PSA ; no biopsy Overview:  Overview:  Qualifier: Diagnosis of  By: Alwyn Ren MD, Chrissie Noa   No PMH of elevated PSA ; no biopsy Overview:  Overview:  Overview:  Qualifier: Diagnosis of  By: Alwyn Ren MD, Chrissie Noa   No PMH of elevated PSA ; no biopsy   Essential (primary) hypertension 09/21/2007   Qualifier: Diagnosis of  By: Alwyn Ren MD, Gae Dry of this note might be different from the original. Overview:  Qualifier: Diagnosis of  By: Alwyn Ren MD, Chrissie Noa   Last Assessment & Plan:  Blood pressure appears to be well controlled. Renal function will be checked.  He has a minor cough; this does not appear to be significant enough to warrant change to losartan Overview:  Overview:  Qu   Family history of type C viral hepatitis 06/30/2012   Brother had liver transplant    Hepatitis A 04/2019   History of colonic polyps 12/23/2006   Qualifier: Diagnosis of  By: Alwyn Ren MD, Chrissie Noa   Dr Ritta Slot, GI Polypectomy X2 , 4/16 /2014. One was  transitional cell adenoma Due annually No FH colon cancer   Formatting of this note might be different from the original. Overview:  Qualifier: Diagnosis of  By: Alwyn Ren MD, Chrissie Noa   Dr Ritta Slot, GI Polypectomy X2 , 4/16 /2014. One was  transitional cell adenoma Due annually No FH colon ca   History  of urinary stone 09/21/2007   Qualifier: Diagnosis of  By: Alwyn Ren MD, Francene Finders , passed spontaneously No Urologist involvement   Formatting of this note might be different from the original. Overview:  Qualifier: Diagnosis of  By: Alwyn Ren MD, Francene Finders , passed spontaneously No Urologist involvement Overview:  Overview:  Qualifier: Diagnosis of  By: Alwyn Ren MD, Francene Finders , passed spontaneously No Urologist involvement    HYPERLIPIDEMIA 09/21/2007   Qualifier: Diagnosis of  By: Alwyn Ren MD, Lynder Parents Lipoprofile 2006 : LDL  108 ( 1072  / 575  ),  HDL 63 , Triglycerides  40. LDL goal = < 135 ; ideally < 105. No FH CAD   Formatting of this note might be different from the original. Overview:  Qualifier: Diagnosis of  By: Alwyn Ren MD, Lynder Parents Lipoprofile 2006 : LDL  108 ( 1072  / 575  ),  HDL 63 , Triglycerides  40. LDL goal = < 135 ; idea   Hypertension    Joint  pain    Liver injury, initial encounter 05/17/2019   Macular degeneration 01/06/2018   Malignant neoplasm of skin    Mallet finger 2012     4th L DIP,Dr Sypher   Nephrolithiasis    Nocturnal hypoxia 11/04/2019   Nuclear sclerotic cataract of both eyes    Obesity (BMI 30-39.9) 01/04/2014   OSA on CPAP 05/17/2020   Osteoarthritis    PCP NOTES >>>>>>>>>>>>>>>> 05/26/2019   Personal history of other malignant neoplasm of skin 06/30/2012   Dr Karlyn Agee, Derm X3 Local resection from  right forearm and forehead.   Mohs surgery of the nose 06/2012, Dr Polly Cobia of this note might be different from the original. Overview:  Dr Karlyn Agee, Derm X3 Local resection from  right forearm and forehead.   Mohs surgery of the nose 06/2012, Dr Irene Limbo Overview:  Overview:  Dr Karlyn Agee, Derm X3 Local resection from  right forearm and fo   RLS (restless legs syndrome)     Tobacco History: Social History   Tobacco Use  Smoking Status Former   Current packs/day: 0.00   Average packs/day: 2.0 packs/day for 26.0 years (52.0 ttl  pk-yrs)   Types: Cigarettes   Start date: 07/29/1956   Quit date: 07/29/1982   Years since quitting: 40.7  Smokeless Tobacco Never  Tobacco Comments   ages [age], up to 2 ppd   Counseling given: Not Answered Tobacco comments: ages [age], up to 2 ppd   Outpatient Medications Prior to Visit  Medication Sig Dispense Refill   aspirin EC 81 MG tablet Take 1 tablet (81 mg total) by mouth daily. Swallow whole. 90 tablet 3   atorvastatin (LIPITOR) 10 MG tablet Take 1 tablet (10 mg total) by mouth at bedtime. 90 tablet 1   cetirizine (ZYRTEC) 10 MG tablet Take 10 mg by mouth daily.     cholecalciferol (VITAMIN D3) 25 MCG (1000 UNIT) tablet Take 1,000 Units by mouth daily.     clonazePAM (KLONOPIN) 0.5 MG tablet TAKE 1 TABLET BY MOUTH EVERY NIGHT AT BEDTIME AS NEEDED FOR RESTLESS LEG 90 tablet 1   ELIQUIS 5 MG TABS tablet TAKE 1 TABLET BY MOUTH TWICE A DAY 180 tablet 1   hydrochlorothiazide (HYDRODIURIL) 12.5 MG tablet TAKE 1 TABLET BY MOUTH EVERY DAY 90 tablet 1   losartan (COZAAR) 25 MG tablet Take 1 tablet (25 mg total) by mouth daily. Patient needs an appointment for further refills. 2 nd attempt 15 tablet 0   metoprolol tartrate (LOPRESSOR) 50 MG tablet Take 12.5 mg by mouth as needed for heart rate control (fast heart rate).     Multiple Vitamin (MULTIVITAMIN WITH MINERALS) TABS tablet Take 1 tablet by mouth daily.     Multiple Vitamins-Minerals (ICAPS AREDS 2 PO) Take 1 tablet by mouth daily in the afternoon.     No facility-administered medications prior to visit.   Review of Systems  Review of Systems  Constitutional: Negative.   Respiratory: Negative.    Cardiovascular: Negative.      Physical Exam  BP 116/62   Pulse (!) 53   Ht 5\' 10"  (1.778 m)   Wt 205 lb (93 kg)   SpO2 97%   BMI 29.41 kg/m  Physical Exam Constitutional:      General: He is not in acute distress.    Appearance: Normal appearance. He is not ill-appearing.  HENT:     Head: Normocephalic and atraumatic.      Mouth/Throat:     Mouth:  Mucous membranes are moist.     Pharynx: Oropharynx is clear.  Cardiovascular:     Rate and Rhythm: Normal rate and regular rhythm.  Pulmonary:     Effort: Pulmonary effort is normal.     Breath sounds: Normal breath sounds.  Musculoskeletal:        General: Normal range of motion.  Skin:    General: Skin is warm and dry.  Neurological:     General: No focal deficit present.     Mental Status: He is alert and oriented to person, place, and time. Mental status is at baseline.  Psychiatric:        Mood and Affect: Mood normal.        Behavior: Behavior normal.        Thought Content: Thought content normal.        Judgment: Judgment normal.      Lab Results:  CBC    Component Value Date/Time   WBC 6.7 10/07/2022 0835   RBC 4.79 10/07/2022 0835   HGB 14.6 10/07/2022 0835   HCT 44.1 10/07/2022 0835   PLT 208.0 10/07/2022 0835   MCV 91.9 10/07/2022 0835   MCH 30.5 03/04/2020 2131   MCHC 33.2 10/07/2022 0835   RDW 13.5 10/07/2022 0835   LYMPHSABS 1.0 10/07/2022 0835   MONOABS 0.8 10/07/2022 0835   EOSABS 0.4 10/07/2022 0835   BASOSABS 0.0 10/07/2022 0835    BMET    Component Value Date/Time   NA 137 03/06/2023 0840   NA 145 (H) 08/26/2021 0853   K 3.6 03/06/2023 0840   CL 101 03/06/2023 0840   CO2 28 03/06/2023 0840   GLUCOSE 83 03/06/2023 0840   BUN 13 03/06/2023 0840   BUN 12 08/26/2021 0853   CREATININE 0.96 03/06/2023 0840   CALCIUM 9.4 03/06/2023 0840   GFRNONAA 82 09/17/2020 1400   GFRAA 95 09/17/2020 1400    BNP No results found for: "BNP"  ProBNP No results found for: "PROBNP"  Imaging: DG Chest 2 View  Result Date: 03/16/2023 CLINICAL DATA:  Cough and shortness of breath EXAM: CHEST - 2 VIEW COMPARISON:  Chest x-ray dated August eighth 2021 FINDINGS: The heart size and mediastinal contours are within normal limits. Mild linear opacities of the right lung base, likely due to scarring or atelectasis. Both lungs are  otherwise clear. Eventration of the right hemidiaphragm. The visualized skeletal structures are unremarkable. IMPRESSION: No active cardiopulmonary disease. Electronically Signed   By: Allegra Lai M.D.   On: 03/16/2023 14:07     Assessment & Plan:   OSA on CPAP - Hx severe OSA, AHI 57/hour. Maintained on CPAP. He is 93% compliant with use over the last 30 days.  Average usage 5 hours 11 minutes.  Current pressure 9 cm H2O; Residual AHI 4.3/hour.  No changes today.  Advised patient continue to wear CPAP nightly.  Encouraged side sleeping position and weight loss efforts.  Follow-up in 1 year or sooner if needed.  Nocturnal hypoxia CPAP titration completed 05/17/20, recommended pressure 9 cm H20 with no oxygen requirement.   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  No indication for maintenance bronchodilators.  Allergic rhinitis - Stable;Continue Cetirizine 10mg  daily, ocean saline nasal spray twice daily and prn mucinex      Glenford Bayley, NP 04/09/2023

## 2023-04-09 NOTE — Assessment & Plan Note (Signed)
CPAP titration completed 05/17/20, recommended pressure 9 cm H20 with no oxygen requirement.

## 2023-04-10 ENCOUNTER — Other Ambulatory Visit: Payer: Self-pay | Admitting: Cardiology

## 2023-04-22 ENCOUNTER — Ambulatory Visit (INDEPENDENT_AMBULATORY_CARE_PROVIDER_SITE_OTHER): Payer: Medicare Other | Admitting: Psychology

## 2023-04-22 DIAGNOSIS — F4322 Adjustment disorder with anxiety: Secondary | ICD-10-CM

## 2023-04-22 NOTE — Progress Notes (Signed)
[facility] Counselor/Therapist Progress Note  Patient ID: Gregory Wilkins, MRN: [id number],    Date: 04/22/2023  Time Spent: 9:00am-9:50am   50 minutes   Treatment Type: Individual Therapy  Reported Symptoms: stress  Mental Status Exam: Appearance:  Casual     Behavior: Appropriate  Motor: Normal  Speech/Language:  Normal Rate  Affect: Appropriate  Mood: normal  Thought process: normal  Thought content:   WNL  Sensory/Perceptual disturbances:   WNL  Orientation: oriented to person, place, time/date, and situation  Attention: Good  Concentration: Good  Memory: WNL  Fund of knowledge:  Good  Insight:   Good  Judgment:  Good  Impulse Control: Good   Risk Assessment: Danger to Self:  No Self-injurious Behavior: No Danger to Others: No Duty to Warn:no Physical Aggression / Violence:No  Access to Firearms a concern: No  Gang Involvement:No   Subjective: Pt present for face-to-face individual therapy via video.  Pt consents to telehealth video session and is aware of limitations and benefits of virtual sessions. Location of pt: home Location of therapist: home office.   Pt talked about having a difficult morning so far.  Addressed the issues and helped pt process his feelings.    Worked on self compassion and giving himself grace.  Pt talked about his family.  He and his wife feel tired from the chronic stress of dealing with their son.   Worked on self care strategies.   Provided supportive therapy.  Interventions: Cognitive Behavioral Therapy and Insight-Oriented  Diagnosis: F43.22  Plan of Care: Recommend ongoing therapy.  Pt participated in setting treatment goals.   He is progressing toward goals.   Plan to continue to meet every two weeks.   Treatment Plan  (Treatment Plan Target Date 10/22/2023) Client Abilities/Strengths  Pt is bright, engaging and motivated for therapy.  Client Treatment Preferences  Individual therapy.  Client Statement  of Needs  Improve coping skills.  Symptoms  Autonomic hyperactivity (e.g., palpitations, shortness of breath, dry mouth, trouble swallowing, nausea, diarrhea). Excessive and/or unrealistic worry that is difficult to control occurring more days than not for at least 6 months about a number of events or activities. Hypervigilance (e.g., feeling constantly on edge, experiencing concentration difficulties, having trouble falling or staying asleep, exhibiting a general state of irritability). Motor tension (e.g., restlessness, tiredness, shakiness, muscle tension). Problems Addressed  Anxiety Goals 1. Enhance ability to effectively cope with the full variety of life's worries and anxieties. 2. Learn and implement coping skills that result in a reduction of anxiety and worry, and improved daily functioning. Objective Learn to accept limitations in life and commit to tolerating, rather than avoiding, unpleasant emotions while accomplishing meaningful goals. Target Date: 2023-10-22 Frequency: Biweekly Progress: 60 Modality: individual Related Interventions 1. Use techniques from Acceptance and Commitment Therapy to help client accept uncomfortable realities such as lack of complete control, imperfections, and uncertainty and tolerate unpleasant emotions and thoughts in order to accomplish value-consistent goals. Objective Learn and implement problem-solving strategies for realistically addressing worries. Target Date: 2023-10-22 Frequency: Biweekly Progress: 60 Modality: individual Related Interventions 1. Assign the client a homework exercise in which he/she problem-solves a current problem.  review, reinforce success, and provide corrective feedback toward improvement. 2. Teach the client problem-solving strategies involving specifically defining a problem, generating options for addressing it, evaluating the pros and cons of each option, selecting and implementing an optional action, and reevaluating  and refining the action. Objective Learn and implement calming skills to reduce overall  anxiety and manage anxiety symptoms. Target Date: 2023-10-22 Frequency: Biweekly Progress: 60 Modality: individual Related Interventions 1. Assign the client to read about progressive muscle relaxation and other calming strategies in relevant books or treatment manuals (e.g., Progressive Relaxation Training by Robb Matar and Alen Blew; Mastery of Your Anxiety and Worry: Workbook by Earlie Counts). 2. Assign the client homework each session in which he/she practices relaxation exercises daily, gradually applying them progressively from non-anxiety-provoking to anxiety-provoking situations; review and reinforce success while providing corrective feedback toward improvement. 3. Teach the client calming/relaxation skills (e.g., applied relaxation, progressive muscle relaxation, cue controlled relaxation; mindful breathing; biofeedback) and how to discriminate better between relaxation and tension; teach the client how to apply these skills to his/her daily life. 3. Reduce overall frequency, intensity, and duration of the anxiety so that daily functioning is not impaired. 4. Resolve the core conflict that is the source of anxiety. 5. Stabilize anxiety level while increasing ability to function on a daily basis. Diagnosis :    F43.22 Conditions For Discharge Achievement of treatment goals and objectives.  Salvator Seppala, LCSW

## 2023-05-06 ENCOUNTER — Ambulatory Visit (INDEPENDENT_AMBULATORY_CARE_PROVIDER_SITE_OTHER): Payer: Medicare Other | Admitting: Psychology

## 2023-05-06 DIAGNOSIS — F4322 Adjustment disorder with anxiety: Secondary | ICD-10-CM

## 2023-05-06 NOTE — Progress Notes (Signed)
[facility] Counselor/Therapist Progress Note  Patient ID: MARRELL DICAPRIO, MRN: [id number],    Date: 05/06/2023  Time Spent: 9:00am-9:50am   50 minutes   Treatment Type: Individual Therapy  Reported Symptoms: stress  Mental Status Exam: Appearance:  Casual     Behavior: Appropriate  Motor: Normal  Speech/Language:  Normal Rate  Affect: Appropriate  Mood: normal  Thought process: normal  Thought content:   WNL  Sensory/Perceptual disturbances:   WNL  Orientation: oriented to person, place, time/date, and situation  Attention: Good  Concentration: Good  Memory: WNL  Fund of knowledge:  Good  Insight:   Good  Judgment:  Good  Impulse Control: Good   Risk Assessment: Danger to Self:  No Self-injurious Behavior: No Danger to Others: No Duty to Warn:no Physical Aggression / Violence:No  Access to Firearms a concern: No  Gang Involvement:No   Subjective: Pt present for face-to-face individual therapy via video.  Pt consents to telehealth video session and is aware of limitations and benefits of virtual sessions. Location of pt: home Location of therapist: home office.   Pt talked about feeling down bc of issues with his son.  Pt gets tired of his son "freeloading" and not doing anything to help himself.  Pt gets frustrated with himself at times for how he reacts to his son. Addressed the issues and helped pt process his feelings.    Worked on self compassion and giving himself grace.  Pt talked about planning to work the [employer].  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   Worked on self care strategies.   Provided supportive therapy.  Interventions: Cognitive Behavioral Therapy and Insight-Oriented  Diagnosis: F43.22  Plan of Care: Recommend ongoing therapy.  Pt participated in setting treatment goals.   He is progressing toward goals.   Plan to continue to meet every two weeks.   Treatment Plan  (Treatment Plan  Target Date 10/22/2023) Client Abilities/Strengths  Pt is bright, engaging and motivated for therapy.  Client Treatment Preferences  Individual therapy.  Client Statement of Needs  Improve coping skills.  Symptoms  Autonomic hyperactivity (e.g., palpitations, shortness of breath, dry mouth, trouble swallowing, nausea, diarrhea). Excessive and/or unrealistic worry that is difficult to control occurring more days than not for at least 6 months about a number of events or activities. Hypervigilance (e.g., feeling constantly on edge, experiencing concentration difficulties, having trouble falling or staying asleep, exhibiting a general state of irritability). Motor tension (e.g., restlessness, tiredness, shakiness, muscle tension). Problems Addressed  Anxiety Goals 1. Enhance ability to effectively cope with the full variety of life's worries and anxieties. 2. Learn and implement coping skills that result in a reduction of anxiety and worry, and improved daily functioning. Objective Learn to accept limitations in life and commit to tolerating, rather than avoiding, unpleasant emotions while accomplishing meaningful goals. Target Date: 2023-10-22 Frequency: Biweekly Progress: 60 Modality: individual Related Interventions 1. Use techniques from Acceptance and Commitment Therapy to help client accept uncomfortable realities such as lack of complete control, imperfections, and uncertainty and tolerate unpleasant emotions and thoughts in order to accomplish value-consistent goals. Objective Learn and implement problem-solving strategies for realistically addressing worries. Target Date: 2023-10-22 Frequency: Biweekly Progress: 60 Modality: individual Related Interventions 1. Assign the client a homework exercise in which he/she problem-solves a current problem.  review, reinforce success, and provide corrective feedback toward improvement. 2. Teach the client problem-solving strategies involving  specifically defining a problem, generating options  for addressing it, evaluating the pros and cons of each option, selecting and implementing an optional action, and reevaluating and refining the action. Objective Learn and implement calming skills to reduce overall anxiety and manage anxiety symptoms. Target Date: 2023-10-22 Frequency: Biweekly Progress: 60 Modality: individual Related Interventions 1. Assign the client to read about progressive muscle relaxation and other calming strategies in relevant books or treatment manuals (e.g., Progressive Relaxation Training by Robb Matar and Alen Blew; Mastery of Your Anxiety and Worry: Workbook by Earlie Counts). 2. Assign the client homework each session in which he/she practices relaxation exercises daily, gradually applying them progressively from non-anxiety-provoking to anxiety-provoking situations; review and reinforce success while providing corrective feedback toward improvement. 3. Teach the client calming/relaxation skills (e.g., applied relaxation, progressive muscle relaxation, cue controlled relaxation; mindful breathing; biofeedback) and how to discriminate better between relaxation and tension; teach the client how to apply these skills to his/her daily life. 3. Reduce overall frequency, intensity, and duration of the anxiety so that daily functioning is not impaired. 4. Resolve the core conflict that is the source of anxiety. 5. Stabilize anxiety level while increasing ability to function on a daily basis. Diagnosis :    F43.22 Conditions For Discharge Achievement of treatment goals and objectives.  Wilson Sample, LCSW

## 2023-05-20 ENCOUNTER — Ambulatory Visit (INDEPENDENT_AMBULATORY_CARE_PROVIDER_SITE_OTHER): Payer: Medicare Other | Admitting: Psychology

## 2023-05-20 DIAGNOSIS — F4322 Adjustment disorder with anxiety: Secondary | ICD-10-CM

## 2023-05-20 NOTE — Progress Notes (Signed)
[facility] Counselor/Therapist Progress Note  Patient ID: Gregory Wilkins, MRN: [id number],    Date: 05/20/2023  Time Spent: 9:00am-9:50am   50 minutes   Treatment Type: Individual Therapy  Reported Symptoms: stress  Mental Status Exam: Appearance:  Casual     Behavior: Appropriate  Motor: Normal  Speech/Language:  Normal Rate  Affect: Appropriate  Mood: normal  Thought process: normal  Thought content:   WNL  Sensory/Perceptual disturbances:   WNL  Orientation: oriented to person, place, time/date, and situation  Attention: Good  Concentration: Good  Memory: WNL  Fund of knowledge:  Good  Insight:   Good  Judgment:  Good  Impulse Control: Good   Risk Assessment: Danger to Self:  No Self-injurious Behavior: No Danger to Others: No Duty to Warn:no Physical Aggression / Violence:No  Access to Firearms a concern: No  Gang Involvement:No   Subjective: Pt present for face-to-face individual therapy via video.  Pt consents to telehealth video session and is aware of limitations and benefits of virtual sessions. Location of pt: home Location of therapist: home office.   Pt talked about his son.  Pt's son has been paranoid and depressed.  Pt's son is still using drugs.  This is very upsetting to pt and is hard to have patience with.  Addressed the issues and helped pt process his feelings.    Worked on self compassion and giving himself grace.  Pt talked about his wife who gets mad at their son and puts pt in the middle.  Pt talked about working the [employer].  It has been very busy. Worked on self care strategies.   Provided supportive therapy.  Interventions: Cognitive Behavioral Therapy and Insight-Oriented  Diagnosis: F43.22  Plan of Care: Recommend ongoing therapy.  Pt participated in setting treatment goals.   He is progressing toward goals.   Plan to continue to meet every two weeks.   Treatment Plan  (Treatment Plan Target Date  10/22/2023) Client Abilities/Strengths  Pt is bright, engaging and motivated for therapy.  Client Treatment Preferences  Individual therapy.  Client Statement of Needs  Improve coping skills.  Symptoms  Autonomic hyperactivity (e.g., palpitations, shortness of breath, dry mouth, trouble swallowing, nausea, diarrhea). Excessive and/or unrealistic worry that is difficult to control occurring more days than not for at least 6 months about a number of events or activities. Hypervigilance (e.g., feeling constantly on edge, experiencing concentration difficulties, having trouble falling or staying asleep, exhibiting a general state of irritability). Motor tension (e.g., restlessness, tiredness, shakiness, muscle tension). Problems Addressed  Anxiety Goals 1. Enhance ability to effectively cope with the full variety of life's worries and anxieties. 2. Learn and implement coping skills that result in a reduction of anxiety and worry, and improved daily functioning. Objective Learn to accept limitations in life and commit to tolerating, rather than avoiding, unpleasant emotions while accomplishing meaningful goals. Target Date: 2023-10-22 Frequency: Biweekly Progress: 60 Modality: individual Related Interventions 1. Use techniques from Acceptance and Commitment Therapy to help client accept uncomfortable realities such as lack of complete control, imperfections, and uncertainty and tolerate unpleasant emotions and thoughts in order to accomplish value-consistent goals. Objective Learn and implement problem-solving strategies for realistically addressing worries. Target Date: 2023-10-22 Frequency: Biweekly Progress: 60 Modality: individual Related Interventions 1. Assign the client a homework exercise in which he/she problem-solves a current problem.  review, reinforce success, and provide corrective feedback toward improvement. 2. Teach the client problem-solving strategies involving specifically  defining a problem, generating  options for addressing it, evaluating the pros and cons of each option, selecting and implementing an optional action, and reevaluating and refining the action. Objective Learn and implement calming skills to reduce overall anxiety and manage anxiety symptoms. Target Date: 2023-10-22 Frequency: Biweekly Progress: 60 Modality: individual Related Interventions 1. Assign the client to read about progressive muscle relaxation and other calming strategies in relevant books or treatment manuals (e.g., Progressive Relaxation Training by Robb Matar and Alen Blew; Mastery of Your Anxiety and Worry: Workbook by Earlie Counts). 2. Assign the client homework each session in which he/she practices relaxation exercises daily, gradually applying them progressively from non-anxiety-provoking to anxiety-provoking situations; review and reinforce success while providing corrective feedback toward improvement. 3. Teach the client calming/relaxation skills (e.g., applied relaxation, progressive muscle relaxation, cue controlled relaxation; mindful breathing; biofeedback) and how to discriminate better between relaxation and tension; teach the client how to apply these skills to his/her daily life. 3. Reduce overall frequency, intensity, and duration of the anxiety so that daily functioning is not impaired. 4. Resolve the core conflict that is the source of anxiety. 5. Stabilize anxiety level while increasing ability to function on a daily basis. Diagnosis :    F43.22 Conditions For Discharge Achievement of treatment goals and objectives.  Earlisha Sharples, LCSW

## 2023-06-03 ENCOUNTER — Ambulatory Visit: Payer: Medicare Other | Admitting: Psychology

## 2023-06-03 DIAGNOSIS — F4322 Adjustment disorder with anxiety: Secondary | ICD-10-CM

## 2023-06-03 NOTE — Progress Notes (Signed)
[facility] Counselor/Therapist Progress Note  Patient ID: Gregory Wilkins, MRN: [id number],    Date: 06/03/2023  Time Spent: 9:00am-9:50am   50 minutes   Treatment Type: Individual Therapy  Reported Symptoms: stress  Mental Status Exam: Appearance:  Casual     Behavior: Appropriate  Motor: Normal  Speech/Language:  Normal Rate  Affect: Appropriate  Mood: normal  Thought process: normal  Thought content:   WNL  Sensory/Perceptual disturbances:   WNL  Orientation: oriented to person, place, time/date, and situation  Attention: Good  Concentration: Good  Memory: WNL  Fund of knowledge:  Good  Insight:   Good  Judgment:  Good  Impulse Control: Good   Risk Assessment: Danger to Self:  No Self-injurious Behavior: No Danger to Others: No Duty to Warn:no Physical Aggression / Violence:No  Access to Firearms a concern: No  Gang Involvement:No   Subjective: Pt present for face-to-face individual therapy via video.  Pt consents to telehealth video session and is aware of limitations and benefits of virtual sessions. Location of pt: home Location of therapist: home office.   Pt talked about the election.  He is not happy with the results of the presidential election.  He is feeling philosophical.   Pt shared his thoughts and feelings.   Helped him process the issues and his reactions.   Pt talked about his family.  Pt states he is in a "holding pattern" with his family.  Pt states his wife is mad at him and his son Gregory Wilkins is still being challenging.  Worked on self care strategies.   Provided supportive therapy.  Interventions: Cognitive Behavioral Therapy and Insight-Oriented  Diagnosis: F43.22  Plan of Care: Recommend ongoing therapy.  Pt participated in setting treatment goals.   He is progressing toward goals.   Plan to continue to meet every two weeks.   Treatment Plan  (Treatment Plan Target Date 10/22/2023) Client Abilities/Strengths  Pt is bright,  engaging and motivated for therapy.  Client Treatment Preferences  Individual therapy.  Client Statement of Needs  Improve coping skills.  Symptoms  Autonomic hyperactivity (e.g., palpitations, shortness of breath, dry mouth, trouble swallowing, nausea, diarrhea). Excessive and/or unrealistic worry that is difficult to control occurring more days than not for at least 6 months about a number of events or activities. Hypervigilance (e.g., feeling constantly on edge, experiencing concentration difficulties, having trouble falling or staying asleep, exhibiting a general state of irritability). Motor tension (e.g., restlessness, tiredness, shakiness, muscle tension). Problems Addressed  Anxiety Goals 1. Enhance ability to effectively cope with the full variety of life's worries and anxieties. 2. Learn and implement coping skills that result in a reduction of anxiety and worry, and improved daily functioning. Objective Learn to accept limitations in life and commit to tolerating, rather than avoiding, unpleasant emotions while accomplishing meaningful goals. Target Date: 2023-10-22 Frequency: Biweekly Progress: 60 Modality: individual Related Interventions 1. Use techniques from Acceptance and Commitment Therapy to help client accept uncomfortable realities such as lack of complete control, imperfections, and uncertainty and tolerate unpleasant emotions and thoughts in order to accomplish value-consistent goals. Objective Learn and implement problem-solving strategies for realistically addressing worries. Target Date: 2023-10-22 Frequency: Biweekly Progress: 60 Modality: individual Related Interventions 1. Assign the client a homework exercise in which he/she problem-solves a current problem.  review, reinforce success, and provide corrective feedback toward improvement. 2. Teach the client problem-solving strategies involving specifically defining a problem, generating options for addressing it,  evaluating the pros and cons of  each option, selecting and implementing an optional action, and reevaluating and refining the action. Objective Learn and implement calming skills to reduce overall anxiety and manage anxiety symptoms. Target Date: 2023-10-22 Frequency: Biweekly Progress: 60 Modality: individual Related Interventions 1. Assign the client to read about progressive muscle relaxation and other calming strategies in relevant books or treatment manuals (e.g., Progressive Relaxation Training by Robb Matar and Alen Blew; Mastery of Your Anxiety and Worry: Workbook by Earlie Counts). 2. Assign the client homework each session in which he/she practices relaxation exercises daily, gradually applying them progressively from non-anxiety-provoking to anxiety-provoking situations; review and reinforce success while providing corrective feedback toward improvement. 3. Teach the client calming/relaxation skills (e.g., applied relaxation, progressive muscle relaxation, cue controlled relaxation; mindful breathing; biofeedback) and how to discriminate better between relaxation and tension; teach the client how to apply these skills to his/her daily life. 3. Reduce overall frequency, intensity, and duration of the anxiety so that daily functioning is not impaired. 4. Resolve the core conflict that is the source of anxiety. 5. Stabilize anxiety level while increasing ability to function on a daily basis. Diagnosis :    F43.22 Conditions For Discharge Achievement of treatment goals and objectives.  Caylyn Tedeschi, LCSW

## 2023-06-11 ENCOUNTER — Other Ambulatory Visit: Payer: Self-pay | Admitting: Cardiology

## 2023-06-17 ENCOUNTER — Ambulatory Visit: Payer: Medicare Other | Admitting: Psychology

## 2023-06-17 DIAGNOSIS — F4322 Adjustment disorder with anxiety: Secondary | ICD-10-CM | POA: Diagnosis not present

## 2023-06-17 NOTE — Progress Notes (Signed)
[facility] Counselor/Therapist Progress Note  Patient ID: Gregory Wilkins, MRN: [id number],    Date: 06/17/2023  Time Spent: 9:00am-9:50am   50 minutes   Treatment Type: Individual Therapy  Reported Symptoms: stress  Mental Status Exam: Appearance:  Casual     Behavior: Appropriate  Motor: Normal  Speech/Language:  Normal Rate  Affect: Appropriate  Mood: normal  Thought process: normal  Thought content:   WNL  Sensory/Perceptual disturbances:   WNL  Orientation: oriented to person, place, time/date, and situation  Attention: Good  Concentration: Good  Memory: WNL  Fund of knowledge:  Good  Insight:   Good  Judgment:  Good  Impulse Control: Good   Risk Assessment: Danger to Self:  No Self-injurious Behavior: No Danger to Others: No Duty to Warn:no Physical Aggression / Violence:No  Access to Firearms a concern: No  Gang Involvement:No   Subjective: Pt present for face-to-face individual therapy via video.  Pt consents to telehealth video session and is aware of limitations and benefits of virtual sessions. Location of pt: home Location of therapist: home office.   Pt talked about going through a "period of hope" and it scares him.  His son Keagen is going to some [employer]   Addressed pt's fears and helped him process his feelings.  Pt is worried about being disappointed if his son falters again.  Pt talked about the election.   He worries about what may happen the next 4 years.   Pt talked about Thanksgiving plans.  He will go to dinner at his niece's family home.  Pt's son will not attend which is disappointing to him.  Worked on self care strategies.   Provided supportive therapy.  Interventions: Cognitive Behavioral Therapy and Insight-Oriented  Diagnosis: F43.22  Plan of Care: Recommend ongoing therapy.  Pt participated in setting treatment goals.   He is progressing toward goals.   Plan to continue to meet every two weeks.   Treatment  Plan  (Treatment Plan Target Date 10/22/2023) Client Abilities/Strengths  Pt is bright, engaging and motivated for therapy.  Client Treatment Preferences  Individual therapy.  Client Statement of Needs  Improve coping skills.  Symptoms  Autonomic hyperactivity (e.g., palpitations, shortness of breath, dry mouth, trouble swallowing, nausea, diarrhea). Excessive and/or unrealistic worry that is difficult to control occurring more days than not for at least 6 months about a number of events or activities. Hypervigilance (e.g., feeling constantly on edge, experiencing concentration difficulties, having trouble falling or staying asleep, exhibiting a general state of irritability). Motor tension (e.g., restlessness, tiredness, shakiness, muscle tension). Problems Addressed  Anxiety Goals 1. Enhance ability to effectively cope with the full variety of life's worries and anxieties. 2. Learn and implement coping skills that result in a reduction of anxiety and worry, and improved daily functioning. Objective Learn to accept limitations in life and commit to tolerating, rather than avoiding, unpleasant emotions while accomplishing meaningful goals. Target Date: 2023-10-22 Frequency: Biweekly Progress: 60 Modality: individual Related Interventions 1. Use techniques from Acceptance and Commitment Therapy to help client accept uncomfortable realities such as lack of complete control, imperfections, and uncertainty and tolerate unpleasant emotions and thoughts in order to accomplish value-consistent goals. Objective Learn and implement problem-solving strategies for realistically addressing worries. Target Date: 2023-10-22 Frequency: Biweekly Progress: 60 Modality: individual Related Interventions 1. Assign the client a homework exercise in which he/she problem-solves a current problem.  review, reinforce success, and provide corrective feedback toward improvement. 2. Teach the client problem-solving  strategies involving specifically defining a problem, generating options for addressing it, evaluating the pros and cons of each option, selecting and implementing an optional action, and reevaluating and refining the action. Objective Learn and implement calming skills to reduce overall anxiety and manage anxiety symptoms. Target Date: 2023-10-22 Frequency: Biweekly Progress: 60 Modality: individual Related Interventions 1. Assign the client to read about progressive muscle relaxation and other calming strategies in relevant books or treatment manuals (e.g., Progressive Relaxation Training by Robb Matar and Alen Blew; Mastery of Your Anxiety and Worry: Workbook by Earlie Counts). 2. Assign the client homework each session in which he/she practices relaxation exercises daily, gradually applying them progressively from non-anxiety-provoking to anxiety-provoking situations; review and reinforce success while providing corrective feedback toward improvement. 3. Teach the client calming/relaxation skills (e.g., applied relaxation, progressive muscle relaxation, cue controlled relaxation; mindful breathing; biofeedback) and how to discriminate better between relaxation and tension; teach the client how to apply these skills to his/her daily life. 3. Reduce overall frequency, intensity, and duration of the anxiety so that daily functioning is not impaired. 4. Resolve the core conflict that is the source of anxiety. 5. Stabilize anxiety level while increasing ability to function on a daily basis. Diagnosis :    F43.22 Conditions For Discharge Achievement of treatment goals and objectives.  Yan Pankratz, LCSW

## 2023-07-01 ENCOUNTER — Ambulatory Visit: Payer: Medicare Other | Admitting: Psychology

## 2023-07-08 ENCOUNTER — Other Ambulatory Visit: Payer: Self-pay | Admitting: Internal Medicine

## 2023-07-15 ENCOUNTER — Ambulatory Visit: Payer: Medicare Other | Admitting: Psychology

## 2023-07-15 DIAGNOSIS — F4322 Adjustment disorder with anxiety: Secondary | ICD-10-CM

## 2023-07-15 NOTE — Progress Notes (Signed)
[facility] Counselor/Therapist Progress Note  Patient ID: WADDELL SCHLESSER, MRN: [id number],    Date: 07/15/2023  Time Spent: 9:00am-9:50am   50 minutes   Treatment Type: Individual Therapy  Reported Symptoms: stress  Mental Status Exam: Appearance:  Casual     Behavior: Appropriate  Motor: Normal  Speech/Language:  Normal Rate  Affect: Appropriate  Mood: normal  Thought process: normal  Thought content:   WNL  Sensory/Perceptual disturbances:   WNL  Orientation: oriented to person, place, time/date, and situation  Attention: Good  Concentration: Good  Memory: WNL  Fund of knowledge:  Good  Insight:   Good  Judgment:  Good  Impulse Control: Good   Risk Assessment: Danger to Self:  No Self-injurious Behavior: No Danger to Others: No Duty to Warn:no Physical Aggression / Violence:No  Access to Firearms a concern: No  Gang Involvement:No   Subjective: Pt present for face-to-face individual therapy via video.  Pt consents to telehealth video session and is aware of limitations and benefits of virtual sessions. Location of pt: home Location of therapist: home office.   Pt talked about Thanksgiving.   He was with extended family and it went well.   Pt was sad however that his son was not there.   Pt states he is not looking forward to Christmas.  He "hates the holidays" bc of issues with his son who is an addict.   Pt tries to not have expectations.   He is working on letting go.  Pt talked about his wife.   She complains a lot and does not even try to work with their son Danson anymore.  Addressed how this impacts pt.   Pt has been thinking about regrets for the past.   He tends to be hard on himself.   Addressed the things pt regrets.  Pt regrets hurting people and not being brave enough at times.  Worked on self forgiveness.  Addressed how he can balance out his regrets with a list of things he has done well.   Pt states this would be hard for him.  Worked  on self care strategies.   Provided supportive therapy.  Interventions: Cognitive Behavioral Therapy and Insight-Oriented  Diagnosis: F43.22  Plan of Care: Recommend ongoing therapy.  Pt participated in setting treatment goals.   He is progressing toward goals.   Plan to continue to meet every two weeks.   Treatment Plan  (Treatment Plan Target Date 10/22/2023) Client Abilities/Strengths  Pt is bright, engaging and motivated for therapy.  Client Treatment Preferences  Individual therapy.  Client Statement of Needs  Improve coping skills.  Symptoms  Autonomic hyperactivity (e.g., palpitations, shortness of breath, dry mouth, trouble swallowing, nausea, diarrhea). Excessive and/or unrealistic worry that is difficult to control occurring more days than not for at least 6 months about a number of events or activities. Hypervigilance (e.g., feeling constantly on edge, experiencing concentration difficulties, having trouble falling or staying asleep, exhibiting a general state of irritability). Motor tension (e.g., restlessness, tiredness, shakiness, muscle tension). Problems Addressed  Anxiety Goals 1. Enhance ability to effectively cope with the full variety of life's worries and anxieties. 2. Learn and implement coping skills that result in a reduction of anxiety and worry, and improved daily functioning. Objective Learn to accept limitations in life and commit to tolerating, rather than avoiding, unpleasant emotions while accomplishing meaningful goals. Target Date: 2023-10-22 Frequency: Biweekly Progress: 60 Modality: individual Related Interventions 1. Use techniques from Acceptance and Commitment Therapy  to help client accept uncomfortable realities such as lack of complete control, imperfections, and uncertainty and tolerate unpleasant emotions and thoughts in order to accomplish value-consistent goals. Objective Learn and implement problem-solving strategies for realistically  addressing worries. Target Date: 2023-10-22 Frequency: Biweekly Progress: 60 Modality: individual Related Interventions 1. Assign the client a homework exercise in which he/she problem-solves a current problem.  review, reinforce success, and provide corrective feedback toward improvement. 2. Teach the client problem-solving strategies involving specifically defining a problem, generating options for addressing it, evaluating the pros and cons of each option, selecting and implementing an optional action, and reevaluating and refining the action. Objective Learn and implement calming skills to reduce overall anxiety and manage anxiety symptoms. Target Date: 2023-10-22 Frequency: Biweekly Progress: 60 Modality: individual Related Interventions 1. Assign the client to read about progressive muscle relaxation and other calming strategies in relevant books or treatment manuals (e.g., Progressive Relaxation Training by Robb Matar and Alen Blew; Mastery of Your Anxiety and Worry: Workbook by Earlie Counts). 2. Assign the client homework each session in which he/she practices relaxation exercises daily, gradually applying them progressively from non-anxiety-provoking to anxiety-provoking situations; review and reinforce success while providing corrective feedback toward improvement. 3. Teach the client calming/relaxation skills (e.g., applied relaxation, progressive muscle relaxation, cue controlled relaxation; mindful breathing; biofeedback) and how to discriminate better between relaxation and tension; teach the client how to apply these skills to his/her daily life. 3. Reduce overall frequency, intensity, and duration of the anxiety so that daily functioning is not impaired. 4. Resolve the core conflict that is the source of anxiety. 5. Stabilize anxiety level while increasing ability to function on a daily basis. Diagnosis :    F43.22 Conditions For Discharge Achievement of treatment goals and  objectives.  Jennilee Demarco, LCSW

## 2023-08-11 ENCOUNTER — Ambulatory Visit (INDEPENDENT_AMBULATORY_CARE_PROVIDER_SITE_OTHER): Payer: Medicare Other | Admitting: Internal Medicine

## 2023-08-11 ENCOUNTER — Encounter: Payer: Self-pay | Admitting: Internal Medicine

## 2023-08-11 VITALS — BP 126/70 | HR 67 | Temp 98.1°F | Resp 16 | Ht 70.0 in | Wt 210.0 lb

## 2023-08-11 DIAGNOSIS — Z79899 Other long term (current) drug therapy: Secondary | ICD-10-CM

## 2023-08-11 DIAGNOSIS — I10 Essential (primary) hypertension: Secondary | ICD-10-CM | POA: Diagnosis not present

## 2023-08-11 DIAGNOSIS — E782 Mixed hyperlipidemia: Secondary | ICD-10-CM

## 2023-08-11 DIAGNOSIS — R739 Hyperglycemia, unspecified: Secondary | ICD-10-CM | POA: Diagnosis not present

## 2023-08-11 DIAGNOSIS — G2581 Restless legs syndrome: Secondary | ICD-10-CM

## 2023-08-11 DIAGNOSIS — E559 Vitamin D deficiency, unspecified: Secondary | ICD-10-CM

## 2023-08-11 LAB — CBC WITH DIFFERENTIAL/PLATELET
Basophils Absolute: 0 10*3/uL (ref 0.0–0.1)
Basophils Relative: 0.9 % (ref 0.0–3.0)
Eosinophils Absolute: 0.3 10*3/uL (ref 0.0–0.7)
Eosinophils Relative: 7.1 % — ABNORMAL HIGH (ref 0.0–5.0)
HCT: 45.4 % (ref 39.0–52.0)
Hemoglobin: 14.9 g/dL (ref 13.0–17.0)
Lymphocytes Relative: 25.1 % (ref 12.0–46.0)
Lymphs Abs: 1.2 10*3/uL (ref 0.7–4.0)
MCHC: 32.8 g/dL (ref 30.0–36.0)
MCV: 91.4 fL (ref 78.0–100.0)
Monocytes Absolute: 0.7 10*3/uL (ref 0.1–1.0)
Monocytes Relative: 14.3 % — ABNORMAL HIGH (ref 3.0–12.0)
Neutro Abs: 2.5 10*3/uL (ref 1.4–7.7)
Neutrophils Relative %: 52.6 % (ref 43.0–77.0)
Platelets: 234 10*3/uL (ref 150.0–400.0)
RBC: 4.97 Mil/uL (ref 4.22–5.81)
RDW: 13.4 % (ref 11.5–15.5)
WBC: 4.8 10*3/uL (ref 4.0–10.5)

## 2023-08-11 LAB — COMPREHENSIVE METABOLIC PANEL
ALT: 12 U/L (ref 0–53)
AST: 19 U/L (ref 0–37)
Albumin: 4.7 g/dL (ref 3.5–5.2)
Alkaline Phosphatase: 77 U/L (ref 39–117)
BUN: 9 mg/dL (ref 6–23)
CO2: 29 meq/L (ref 19–32)
Calcium: 9.5 mg/dL (ref 8.4–10.5)
Chloride: 103 meq/L (ref 96–112)
Creatinine, Ser: 0.96 mg/dL (ref 0.40–1.50)
GFR: 74.63 mL/min (ref >60.00)
Glucose, Bld: 91 mg/dL (ref 70–99)
Potassium: 4.1 meq/L (ref 3.5–5.1)
Sodium: 140 meq/L (ref 135–145)
Total Bilirubin: 0.8 mg/dL (ref 0.2–1.2)
Total Protein: 7 g/dL (ref 6.0–8.3)

## 2023-08-11 LAB — LIPID PANEL
Cholesterol: 145 mg/dL (ref 0–200)
HDL: 77.3 mg/dL (ref >39.00)
LDL Cholesterol: 60 mg/dL (ref 0–99)
NonHDL: 67.75
Total CHOL/HDL Ratio: 2
Triglycerides: 40 mg/dL (ref 0.0–149.0)
VLDL: 8 mg/dL (ref 0.0–40.0)

## 2023-08-11 LAB — VITAMIN D 25 HYDROXY (VIT D DEFICIENCY, FRACTURES): VITD: 29.53 ng/mL — ABNORMAL LOW (ref 30.00–100.00)

## 2023-08-11 NOTE — Assessment & Plan Note (Signed)
 HTN: Ambulatory BPs in the 120s, continue HCTZ, losartan .  Takes metoprolol  as needed, check CMP High cholesterol: On atorvastatin , checking FLP Paroxysmal a flutter: No recent palpitations, his smart watch has not detected  any arrhythmias.  Anticoagulated, tolerating well.  Check CBC. 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  On aspirin  Cirrhosis GI procedures on 08/07/2023: EGD WNL, colonoscopy, + polyps.  Next per GI Vitamin D  deficiency: On supplements, checking labs. RLS: On clonazepam , check UDS Lifestyle: Doing well, remains active, yoga and exercise indoors Had a flu and COVID-vaccine RTC 6 months sooner if needed

## 2023-08-11 NOTE — Progress Notes (Signed)
 Subjective:    Patient ID: Gregory Wilkins, male    DOB: 05/28/1941, 83 y.o.   MRN: [id number]  DOS:  08/11/2023 Type of visit - description: Follow-up  Chronic  medical problems addressed. Good med compliance. Denies any blood in the stools or in the urine. Denies chest pain or difficulty breathing.  No palpitation. 2 months ago urinary flow was slightly slow and weak for few days, no dysuria or gross hematuria.  Symptoms are largely resolved.  Review of Systems See above   Past Medical History:  Diagnosis Date   Acne rosacea 12/23/2006   Qualifier: Diagnosis of  By: Tish MD, Elsie   Dr Lorrayne Seashore   Formatting of this note might be different from the original. Overview:  Qualifier: Diagnosis of  By: Tish MD, Elsie   Dr Lorrayne Seashore Overview:  Overview:  Qualifier: Diagnosis of  By: Tish MD, Elsie   Dr Lorrayne Seashore  Overview:  Qualifier: Diagnosis of  By: Tish MD, Elsie   Dr Lorrayne Seashore Overview:  Overview:  Overview:   Acute meniscal tear, lateral 10/01/2021   Alcohol abuse    Allergic rhinitis 12/23/2006   Qualifier: Diagnosis of  By: Tish MD, Elsie   Onset:in high school Character: perennial but increased seasonally Triggers :cat, dust, pollen Allergy testing: Dr Amaryllis Finn Maintenance medications/ response:Flonase/ Nasonex; saline wash; Allegra Smoking history:age 4- 42, up to 2 ppd Family history pulmonary disease: no    Formatting of this note might be different from the original. 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 Overview:  Qualifier: Diagnosis of  By: Tish MD, Elsie Deal of this note might be different from the original. Overview:  Qualifier: Diagnosis of  By: Tish MD, William  IMO 10/01 Updates   Enlarged prostate without lower urinary tract symptoms (luts) 11/09/2008   Formatting of this note might be different from the original. Overview:  Qualifier: Diagnosis of  By: Tish MD, Elsie   No PMH of elevated PSA ; no biopsy Overview:  Overview:  Qualifier: Diagnosis of  By: Tish MD, Elsie   No PMH of elevated PSA ; no biopsy Overview:  Overview:  Overview:  Qualifier: Diagnosis of  By: Tish MD, Elsie   No PMH of elevated PSA ; no biopsy   Essential (primary) hypertension 09/21/2007   Qualifier: Diagnosis of  By: Tish MD, Elsie Deal of this note might be different from the original. Overview:  Qualifier: Diagnosis of  By: Tish MD, Elsie   Last Assessment & Plan:  Blood pressure appears to be well controlled. Renal function will be checked.  He has a minor cough; this does not appear to be significant enough to warrant change to losartan  Overview:  Overview:  Qu   Family history of type C viral hepatitis 06/30/2012   Brother  had liver transplant    Hepatitis A 04/2019   History of colonic polyps 12/23/2006   Qualifier: Diagnosis of  By: Tish MD, Elsie   Dr Chyrl Plough, GI Polypectomy X2 , 4/16 /2014. One was  transitional cell adenoma Due annually No FH colon cancer   Formatting of this note might be different from the original. Overview:  Qualifier: Diagnosis of  By: Tish MD, Elsie   Dr Chyrl Plough, GI Polypectomy X2 , 4/16 /2014. One was  transitional cell adenoma Due annually No FH colon ca   History of urinary stone 09/21/2007   Qualifier: Diagnosis of  By: Tish MD, Elsie BLACK[place]OD , passed spontaneously No Urologist  involvement   Formatting of this note might be different from the original. Overview:  Qualifier: Diagnosis of  By: Tish MD, Elsie BLACKWOOD , passed spontaneously No Urologist involvement Overview:  Overview:  Qualifier: Diagnosis of  By: Tish MD, Elsie BLACKWOOD , passed spontaneously No Urologist involvement    HYPERLIPIDEMIA 09/21/2007   Qualifier: Diagnosis of  By: Tish MD, Elsie SINE Lipoprofile 2006 : LDL  108 ( 1072  / 575  ),  HDL 63 , Triglycerides  40. LDL goal = < 135 ; ideally < 105. No FH CAD   Formatting of this note might be different from the original. Overview:  Qualifier: Diagnosis of  By: Tish MD, Elsie SINE Lipoprofile 2006 : LDL  108 ( 1072  / 575  ),  HDL 63 , Triglycerides  40. LDL goal = < 135 ; idea   Hypertension    Joint pain    Liver injury, initial encounter 05/17/2019   Macular degeneration 01/06/2018   Malignant neoplasm of skin    Mallet finger 2012     4th L DIP,Dr Sypher   Nephrolithiasis    Nocturnal hypoxia 11/04/2019   Nuclear sclerotic cataract of both eyes    Obesity (BMI 30-39.9) 01/04/2014   OSA on CPAP 05/17/2020   Osteoarthritis    PCP NOTES >>>>>>>>>>>>>>>> 05/26/2019   Personal history of other malignant neoplasm of skin 06/30/2012   Dr Rolan Molt, Derm X3 Local resection from  right forearm and forehead.   Mohs surgery of the nose 06/2012, Dr Jadine Deal of this note might be different from the original. Overview:  Dr Rolan Molt, Derm X3 Local resection from  right forearm and forehead.   Mohs surgery of the nose 06/2012, Dr Jadine Overview:  Overview:  Dr Rolan Molt, Derm X3 Local resection from  right forearm and fo   RLS (restless legs syndrome)     Past Surgical History:  Procedure Laterality Date   basal cell cancer     X 3   INGUINAL HERNIA REPAIR  [age]   right,left   OPEN REDUCTION INTERNAL FIXATION (ORIF) METACARPAL Right 05/19/2013   Procedure: OPEN REDUCTION INTERNAL FIXATION  RIGHT SMALL  METACARPAL FRACTURE;   Surgeon: Lamar LULLA Leonor Mickey., MD;  Location: [facility];  Service: Orthopedics;  Laterality: Right;   POLYPECTOMY     X 3; Dr Plough   WISDOM TOOTH EXTRACTION      Current Outpatient Medications  Medication Instructions   aspirin  EC 81 mg, Oral, Daily, Swallow whole.   atorvastatin  (LIPITOR) 10 mg, Oral, Daily at bedtime   cetirizine (ZYRTEC) 10 mg, Daily   cholecalciferol (VITAMIN D3) 1,000 Units, Daily   clonazePAM  (KLONOPIN ) 0.5 MG tablet TAKE 1 TABLET BY MOUTH  EVERY NIGHT AT BEDTIME AS NEEDED FOR RESTLESS LEG   ELIQUIS  5 MG TABS tablet TAKE 1 TABLET BY MOUTH TWICE A DAY   hydrochlorothiazide  (HYDRODIURIL ) 12.5 mg, Oral, Daily   losartan  (COZAAR ) 25 mg, Oral, Daily   metoprolol  tartrate (LOPRESSOR ) 12.5 mg, As needed   Multiple Vitamin (MULTIVITAMIN WITH MINERALS) TABS tablet 1 tablet, Daily   Multiple Vitamins-Minerals (ICAPS AREDS 2 PO) 1 tablet, Daily       Objective:   Physical Exam BP 126/70   Pulse 67   Temp 98.1 F (36.7 C) (Oral)   Resp 16   Ht 5' 10 (1.778 m)   Wt 210 lb (95.3 kg)   SpO2 98%   BMI 30.13 kg/m  General:   Well developed, NAD, BMI noted. HEENT:  Normocephalic . Face symmetric, atraumatic Lungs:  CTA B Normal respiratory effort, no intercostal retractions, no accessory muscle use. Heart: RRR,  no murmur.  Lower extremities: no pretibial edema bilaterally  Skin: Not pale. Not jaundice Neurologic:  alert & oriented X3.  Speech normal, gait appropriate for age and unassisted Psych--  Cognition and judgment appear intact.  Cooperative with normal attention span and concentration.  Behavior appropriate. No anxious or depressed appearing.      Assessment    Assessment (new patient 05/17/2019) HTN High Cholesterol Restless leg (clonazepam  prn) Parox. A. Flutter DX 02-2020 H/o kidney stone Chronic LE edema L>R, mild  Hepatitis a, acute 04-2019 OSA  04-2020, on Cpap MSK: DJD, spinal stenosis  CT November 2022, incidental  findings:  -Cirrhosis . H/o heavy drinking quit ~ 1991, hep B&C negative - R adrenal adenoma, -COPD (h/o tobacco) - coronary calcifications Vitamin D  deficiency  PLAN HTN: Ambulatory BPs in the 120s, continue HCTZ, losartan .  Takes metoprolol  as needed, check CMP High cholesterol: On atorvastatin , checking FLP Paroxysmal a flutter: No recent palpitations, his smart watch has not detected  any arrhythmias.  Anticoagulated, tolerating well.  Check CBC. 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  On aspirin  Cirrhosis GI procedures on 08/07/2023: EGD WNL, colonoscopy, + polyps.  Next per GI Vitamin D  deficiency: On supplements, checking labs. RLS: On clonazepam , check UDS Lifestyle: Doing well, remains active, yoga and exercise indoors Had a flu and COVID-vaccine RTC 6 months sooner if needed

## 2023-08-11 NOTE — Patient Instructions (Signed)
  Continue checking your blood pressure regularly Blood pressure goal:  between 110/65 and  135/85. If it is consistently higher or lower, let me know     GO TO THE LAB : Get the blood work     Next visit with me in 6 months.  Sooner if needed. Please schedule it at the front desk

## 2023-08-12 ENCOUNTER — Ambulatory Visit: Payer: Medicare Other | Admitting: Psychology

## 2023-08-12 DIAGNOSIS — F4322 Adjustment disorder with anxiety: Secondary | ICD-10-CM

## 2023-08-12 MED ORDER — VITAMIN D (ERGOCALCIFEROL) 1.25 MG (50000 UNIT) PO CAPS: 50000.0000 [IU] | ORAL_CAPSULE | ORAL | 0 refills | Status: AC

## 2023-08-12 NOTE — Progress Notes (Signed)
 [facility] Counselor/Therapist Progress Note  Patient ID: Gregory Wilkins, MRN: [id number],    Date: 08/12/2023  Time Spent: 9:00am-9:50am   50 minutes   Treatment Type: Individual Therapy  Reported Symptoms: stress  Mental Status Exam: Appearance:  Casual     Behavior: Appropriate  Motor: Normal  Speech/Language:  Normal Rate  Affect: Appropriate  Mood: normal  Thought process: normal  Thought content:   WNL  Sensory/Perceptual disturbances:   WNL  Orientation: oriented to person, place, time/date, and situation  Attention: Good  Concentration: Good  Memory: WNL  Fund of knowledge:  Good  Insight:   Good  Judgment:  Good  Impulse Control: Good   Risk Assessment: Danger to Self:  No Self-injurious Behavior: No Danger to Others: No Duty to Warn:no Physical Aggression / Violence:No  Access to Firearms a concern: No  Gang Involvement:No   Subjective: Pt present for face-to-face individual therapy via video.  Pt consents to telehealth video session and is aware of limitations and benefits of virtual sessions. Location of pt: home Location of therapist: home office.   Pt talked about Christmas.  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   His son did not show up for Christmas gifts even though he stays at their house.  Pt's son was rude.   He has never been good at holidays and birthdays.   Pt talked about having his 50th wedding anniversary.   He took his wife out to dinner.  Their marriage dynamics tend to be challenging.  Helped pt process his feelings about his relationship with his wife.  Worked on self care strategies.   Provided supportive therapy.  Interventions: Cognitive Behavioral Therapy and Insight-Oriented  Diagnosis: F43.22  Plan of Care: Recommend ongoing therapy.  Pt participated in setting treatment goals.   He is progressing toward goals.   Plan to continue to meet every two weeks.   Treatment Plan  (Treatment Plan Target Date  10/22/2023) Client Abilities/Strengths  Pt is bright, engaging and motivated for therapy.  Client Treatment Preferences  Individual therapy.  Client Statement of Needs  Improve coping skills.  Symptoms  Autonomic hyperactivity (e.g., palpitations, shortness of breath, dry mouth, trouble swallowing, nausea, diarrhea). Excessive and/or unrealistic worry that is difficult to control occurring more days than not for at least 6 months about a number of events or activities. Hypervigilance (e.g., feeling constantly on edge, experiencing concentration difficulties, having trouble falling or staying asleep, exhibiting a general state of irritability). Motor tension (e.g., restlessness, tiredness, shakiness, muscle tension). Problems Addressed  Anxiety Goals 1. Enhance ability to effectively cope with the full variety of life's worries and anxieties. 2. Learn and implement coping skills that result in a reduction of anxiety and worry, and improved daily functioning. Objective Learn to accept limitations in life and commit to tolerating, rather than avoiding, unpleasant emotions while accomplishing meaningful goals. Target Date: 2023-10-22 Frequency: Biweekly Progress: 60 Modality: individual Related Interventions 1. Use techniques from Acceptance and Commitment Therapy to help client accept uncomfortable realities such as lack of complete control, imperfections, and uncertainty and tolerate unpleasant emotions and thoughts in order to accomplish value-consistent goals. Objective Learn and implement problem-solving strategies for realistically addressing worries. Target Date: 2023-10-22 Frequency: Biweekly Progress: 60 Modality: individual Related Interventions 1. Assign the client a homework exercise in which he/she problem-solves a current problem.  review, reinforce success, and provide corrective feedback toward improvement. 2. Teach the client problem-solving strategies involving specifically  defining a problem, generating options for  addressing it, evaluating the pros and cons of each option, selecting and implementing an optional action, and reevaluating and refining the action. Objective Learn and implement calming skills to reduce overall anxiety and manage anxiety symptoms. Target Date: 2023-10-22 Frequency: Biweekly Progress: 60 Modality: individual Related Interventions 1. Assign the client to read about progressive muscle relaxation and other calming strategies in relevant books or treatment manuals (e.g., Progressive Relaxation Training by Rodolfo Clan and Arvil Birks; Mastery of Your Anxiety and Worry: Workbook by Rodney Clamp). 2. Assign the client homework each session in which he/she practices relaxation exercises daily, gradually applying them progressively from non-anxiety-provoking to anxiety-provoking situations; review and reinforce success while providing corrective feedback toward improvement. 3. Teach the client calming/relaxation skills (e.g., applied relaxation, progressive muscle relaxation, cue controlled relaxation; mindful breathing; biofeedback) and how to discriminate better between relaxation and tension; teach the client how to apply these skills to his/her daily life. 3. Reduce overall frequency, intensity, and duration of the anxiety so that daily functioning is not impaired. 4. Resolve the core conflict that is the source of anxiety. 5. Stabilize anxiety level while increasing ability to function on a daily basis. Diagnosis :    F43.22 Conditions For Discharge Achievement of treatment goals and objectives.  Gregory Geisel, LCSW

## 2023-08-12 NOTE — Addendum Note (Signed)
 Addended by: Reford Olliff D on: 08/12/2023 11:16 AM   Modules accepted: Orders

## 2023-08-13 LAB — DRUG MONITORING PANEL 375977 , URINE

## 2023-08-13 LAB — DM TEMPLATE

## 2023-08-17 ENCOUNTER — Other Ambulatory Visit: Payer: Self-pay | Admitting: Internal Medicine

## 2023-08-26 ENCOUNTER — Ambulatory Visit: Payer: Medicare Other | Admitting: Psychology

## 2023-08-26 DIAGNOSIS — F4322 Adjustment disorder with anxiety: Secondary | ICD-10-CM | POA: Diagnosis not present

## 2023-08-26 NOTE — Progress Notes (Signed)
[facility] Counselor/Therapist Progress Note  Patient ID: Gregory Wilkins, MRN: [id number],    Date: 08/26/2023  Time Spent: 9:00am-9:50am   50 minutes   Treatment Type: Individual Therapy  Reported Symptoms: stress  Mental Status Exam: Appearance:  Casual     Behavior: Appropriate  Motor: Normal  Speech/Language:  Normal Rate  Affect: Appropriate  Mood: normal  Thought process: normal  Thought content:   WNL  Sensory/Perceptual disturbances:   WNL  Orientation: oriented to person, place, time/date, and situation  Attention: Good  Concentration: Good  Memory: WNL  Fund of knowledge:  Good  Insight:   Good  Judgment:  Good  Impulse Control: Good   Risk Assessment: Danger to Self:  No Self-injurious Behavior: No Danger to Others: No Duty to Warn:no Physical Aggression / Violence:No  Access to Firearms a concern: No  Gang Involvement:No   Subjective: Pt present for face-to-face individual therapy via video.  Pt consents to telehealth video session and is aware of limitations and benefits of virtual sessions. Location of pt: home Location of therapist: home office.   Pt talked about politics and the new presidential administration.   Pt is not happy with the changes that are being made.  At times he feels anxious about the state of the country.  Helped pt process his thoughts and feelings.   Pt talked about his wife.   Her sister wandered off a couple of days ago and pt helped the sister's husband search for her.   The police found her and she was confused and wandering toward downtown.   Pt states this incident is helping him have perspective about his situation with his wife and son.   Pt is trying to appreciate what he can in his family.  Pt and his wife are getting along a little better currently.   Worked on self care strategies.   Provided supportive therapy.  Interventions: Cognitive Behavioral Therapy and Insight-Oriented  Diagnosis:  F43.22  Plan of Care: Recommend ongoing therapy.  Pt participated in setting treatment goals.   He is progressing toward goals.   Plan to continue to meet every two weeks.   Treatment Plan  (Treatment Plan Target Date 10/22/2023) Client Abilities/Strengths  Pt is bright, engaging and motivated for therapy.  Client Treatment Preferences  Individual therapy.  Client Statement of Needs  Improve coping skills.  Symptoms  Autonomic hyperactivity (e.g., palpitations, shortness of breath, dry mouth, trouble swallowing, nausea, diarrhea). Excessive and/or unrealistic worry that is difficult to control occurring more days than not for at least 6 months about a number of events or activities. Hypervigilance (e.g., feeling constantly on edge, experiencing concentration difficulties, having trouble falling or staying asleep, exhibiting a general state of irritability). Motor tension (e.g., restlessness, tiredness, shakiness, muscle tension). Problems Addressed  Anxiety Goals 1. Enhance ability to effectively cope with the full variety of life's worries and anxieties. 2. Learn and implement coping skills that result in a reduction of anxiety and worry, and improved daily functioning. Objective Learn to accept limitations in life and commit to tolerating, rather than avoiding, unpleasant emotions while accomplishing meaningful goals. Target Date: 2023-10-22 Frequency: Biweekly Progress: 60 Modality: individual Related Interventions 1. Use techniques from Acceptance and Commitment Therapy to help client accept uncomfortable realities such as lack of complete control, imperfections, and uncertainty and tolerate unpleasant emotions and thoughts in order to accomplish value-consistent goals. Objective Learn and implement problem-solving strategies for realistically addressing worries. Target Date: 2023-10-22 Frequency: Biweekly Progress:  60 Modality: individual Related Interventions 1. Assign the client a  homework exercise in which he/she problem-solves a current problem.  review, reinforce success, and provide corrective feedback toward improvement. 2. Teach the client problem-solving strategies involving specifically defining a problem, generating options for addressing it, evaluating the pros and cons of each option, selecting and implementing an optional action, and reevaluating and refining the action. Objective Learn and implement calming skills to reduce overall anxiety and manage anxiety symptoms. Target Date: 2023-10-22 Frequency: Biweekly Progress: 60 Modality: individual Related Interventions 1. Assign the client to read about progressive muscle relaxation and other calming strategies in relevant books or treatment manuals (e.g., Progressive Relaxation Training by Robb Matar and Alen Blew; Mastery of Your Anxiety and Worry: Workbook by Earlie Counts). 2. Assign the client homework each session in which he/she practices relaxation exercises daily, gradually applying them progressively from non-anxiety-provoking to anxiety-provoking situations; review and reinforce success while providing corrective feedback toward improvement. 3. Teach the client calming/relaxation skills (e.g., applied relaxation, progressive muscle relaxation, cue controlled relaxation; mindful breathing; biofeedback) and how to discriminate better between relaxation and tension; teach the client how to apply these skills to his/her daily life. 3. Reduce overall frequency, intensity, and duration of the anxiety so that daily functioning is not impaired. 4. Resolve the core conflict that is the source of anxiety. 5. Stabilize anxiety level while increasing ability to function on a daily basis. Diagnosis :    F43.22 Conditions For Discharge Achievement of treatment goals and objectives.  Jermale Crass, LCSW

## 2023-09-09 ENCOUNTER — Other Ambulatory Visit: Payer: Self-pay | Admitting: Internal Medicine

## 2023-09-09 ENCOUNTER — Ambulatory Visit: Payer: Medicare Other | Admitting: Psychology

## 2023-09-09 DIAGNOSIS — F4322 Adjustment disorder with anxiety: Secondary | ICD-10-CM | POA: Diagnosis not present

## 2023-09-09 NOTE — Progress Notes (Signed)
[facility] Counselor/Therapist Progress Note  Patient ID: RONEY YOUTZ, MRN: [id number],    Date: 09/09/2023  Time Spent: 9:00am-9:50am   50 minutes   Treatment Type: Individual Therapy  Reported Symptoms: stress  Mental Status Exam: Appearance:  Casual     Behavior: Appropriate  Motor: Normal  Speech/Language:  Normal Rate  Affect: Appropriate  Mood: normal  Thought process: normal  Thought content:   WNL  Sensory/Perceptual disturbances:   WNL  Orientation: oriented to person, place, time/date, and situation  Attention: Good  Concentration: Good  Memory: WNL  Fund of knowledge:  Good  Insight:   Good  Judgment:  Good  Impulse Control: Good   Risk Assessment: Danger to Self:  No Self-injurious Behavior: No Danger to Others: No Duty to Warn:no Physical Aggression / Violence:No  Access to Firearms a concern: No  Gang Involvement:No   Subjective: Pt present for face-to-face individual therapy via video.  Pt consents to telehealth video session and is aware of limitations and benefits of virtual sessions. Location of pt: home Location of therapist: home office.   Pt talked about his wife.  She is panicking with everything that Trump is doing.   Pt does not panic but gets angry.  Addressed pt's thoughts and feelings about the current political issues and the Trump administration.  Pt talked about being "weird" and sometimes wishing he was more like mainstream people.  Addressed pt's "uniqueness" and how it is attached to his high intelligence.   Addressed how pt can read people's reactions to gauge his interactions.    Worked on self care strategies.   Provided supportive therapy.  Interventions: Cognitive Behavioral Therapy and Insight-Oriented  Diagnosis: F43.22  Plan of Care: Recommend ongoing therapy.  Pt participated in setting treatment goals.   He is progressing toward goals.   Plan to continue to meet every two weeks.   Treatment Plan   (Treatment Plan Target Date 10/22/2023) Client Abilities/Strengths  Pt is bright, engaging and motivated for therapy.  Client Treatment Preferences  Individual therapy.  Client Statement of Needs  Improve coping skills.  Symptoms  Autonomic hyperactivity (e.g., palpitations, shortness of breath, dry mouth, trouble swallowing, nausea, diarrhea). Excessive and/or unrealistic worry that is difficult to control occurring more days than not for at least 6 months about a number of events or activities. Hypervigilance (e.g., feeling constantly on edge, experiencing concentration difficulties, having trouble falling or staying asleep, exhibiting a general state of irritability). Motor tension (e.g., restlessness, tiredness, shakiness, muscle tension). Problems Addressed  Anxiety Goals 1. Enhance ability to effectively cope with the full variety of life's worries and anxieties. 2. Learn and implement coping skills that result in a reduction of anxiety and worry, and improved daily functioning. Objective Learn to accept limitations in life and commit to tolerating, rather than avoiding, unpleasant emotions while accomplishing meaningful goals. Target Date: 2023-10-22 Frequency: Biweekly Progress: 60 Modality: individual Related Interventions 1. Use techniques from Acceptance and Commitment Therapy to help client accept uncomfortable realities such as lack of complete control, imperfections, and uncertainty and tolerate unpleasant emotions and thoughts in order to accomplish value-consistent goals. Objective Learn and implement problem-solving strategies for realistically addressing worries. Target Date: 2023-10-22 Frequency: Biweekly Progress: 60 Modality: individual Related Interventions 1. Assign the client a homework exercise in which he/she problem-solves a current problem.  review, reinforce success, and provide corrective feedback toward improvement. 2. Teach the client problem-solving  strategies involving specifically defining a problem, generating options for addressing it,  evaluating the pros and cons of each option, selecting and implementing an optional action, and reevaluating and refining the action. Objective Learn and implement calming skills to reduce overall anxiety and manage anxiety symptoms. Target Date: 2023-10-22 Frequency: Biweekly Progress: 60 Modality: individual Related Interventions 1. Assign the client to read about progressive muscle relaxation and other calming strategies in relevant books or treatment manuals (e.g., Progressive Relaxation Training by Robb Matar and Alen Blew; Mastery of Your Anxiety and Worry: Workbook by Earlie Counts). 2. Assign the client homework each session in which he/she practices relaxation exercises daily, gradually applying them progressively from non-anxiety-provoking to anxiety-provoking situations; review and reinforce success while providing corrective feedback toward improvement. 3. Teach the client calming/relaxation skills (e.g., applied relaxation, progressive muscle relaxation, cue controlled relaxation; mindful breathing; biofeedback) and how to discriminate better between relaxation and tension; teach the client how to apply these skills to his/her daily life. 3. Reduce overall frequency, intensity, and duration of the anxiety so that daily functioning is not impaired. 4. Resolve the core conflict that is the source of anxiety. 5. Stabilize anxiety level while increasing ability to function on a daily basis. Diagnosis :    F43.22 Conditions For Discharge Achievement of treatment goals and objectives.  Jillayne Witte, LCSW

## 2023-09-14 ENCOUNTER — Other Ambulatory Visit: Payer: Self-pay | Admitting: Internal Medicine

## 2023-09-15 ENCOUNTER — Other Ambulatory Visit: Payer: Self-pay | Admitting: Cardiology

## 2023-09-15 DIAGNOSIS — I4892 Unspecified atrial flutter: Secondary | ICD-10-CM

## 2023-09-15 NOTE — Telephone Encounter (Signed)
Prescription refill request for Eliquis received. Indication: PAF Last office visit: 03/31/23  R Revankar MD Scr: 0.96 on 08/11/23  Epic Age: 83 Weight: 92.4kg  Based on above findings Eliquis 5mg  twice daily is the appropriate dose.  Refill approved.

## 2023-09-23 ENCOUNTER — Ambulatory Visit: Payer: Medicare Other | Admitting: Psychology

## 2023-09-23 DIAGNOSIS — F4322 Adjustment disorder with anxiety: Secondary | ICD-10-CM | POA: Diagnosis not present

## 2023-09-23 NOTE — Progress Notes (Signed)
 [facility] Counselor/Therapist Progress Note  Patient ID: MASEN LUALLEN, MRN: [id number],    Date: 09/23/2023  Time Spent: 9:00am-9:50am   50 minutes   Treatment Type: Individual Therapy  Reported Symptoms: stress  Mental Status Exam: Appearance:  Casual     Behavior: Appropriate  Motor: Normal  Speech/Language:  Normal Rate  Affect: Appropriate  Mood: normal  Thought process: normal  Thought content:   WNL  Sensory/Perceptual disturbances:   WNL  Orientation: oriented to person, place, time/date, and situation  Attention: Good  Concentration: Good  Memory: WNL  Fund of knowledge:  Good  Insight:   Good  Judgment:  Good  Impulse Control: Good   Risk Assessment: Danger to Self:  No Self-injurious Behavior: No Danger to Others: No Duty to Warn:no Physical Aggression / Violence:No  Access to Firearms a concern: No  Gang Involvement:No   Subjective: Pt present for face-to-face individual therapy via video.  Pt consents to telehealth video session and is aware of limitations and benefits of virtual sessions. Location of pt: home Location of therapist: home office.   Pt talked about his family.   Pt had tension with his wife bc of all her complaining.   Pt snapped back with something critical.   He apologized to his wife but states she never Programmer, applications.   Helped pt process his feelings and relationship dynamics. Pt talked about feelings badly about "being weird".   He tends to be hard on himself and focus on the things he feels he has done wrong.  Worked on identifying the things pt has done well and "right" in his life.     Worked on self care strategies.   Provided supportive therapy.  Interventions: Cognitive Behavioral Therapy and Insight-Oriented  Diagnosis: F43.22  Plan of Care: Recommend ongoing therapy.  Pt participated in setting treatment goals.   He is progressing toward goals.   Plan to continue to meet every two weeks.   Treatment Plan   (Treatment Plan Target Date 10/22/2023) Client Abilities/Strengths  Pt is bright, engaging and motivated for therapy.  Client Treatment Preferences  Individual therapy.  Client Statement of Needs  Improve coping skills.  Symptoms  Autonomic hyperactivity (e.g., palpitations, shortness of breath, dry mouth, trouble swallowing, nausea, diarrhea). Excessive and/or unrealistic worry that is difficult to control occurring more days than not for at least 6 months about a number of events or activities. Hypervigilance (e.g., feeling constantly on edge, experiencing concentration difficulties, having trouble falling or staying asleep, exhibiting a general state of irritability). Motor tension (e.g., restlessness, tiredness, shakiness, muscle tension). Problems Addressed  Anxiety Goals 1. Enhance ability to effectively cope with the full variety of life's worries and anxieties. 2. Learn and implement coping skills that result in a reduction of anxiety and worry, and improved daily functioning. Objective Learn to accept limitations in life and commit to tolerating, rather than avoiding, unpleasant emotions while accomplishing meaningful goals. Target Date: 2023-10-22 Frequency: Biweekly Progress: 60 Modality: individual Related Interventions 1. Use techniques from Acceptance and Commitment Therapy to help client accept uncomfortable realities such as lack of complete control, imperfections, and uncertainty and tolerate unpleasant emotions and thoughts in order to accomplish value-consistent goals. Objective Learn and implement problem-solving strategies for realistically addressing worries. Target Date: 2023-10-22 Frequency: Biweekly Progress: 60 Modality: individual Related Interventions 1. Assign the client a homework exercise in which he/she problem-solves a current problem.  review, reinforce success, and provide corrective feedback toward improvement. 2. Teach the client problem-solving  strategies involving specifically defining a problem, generating options for addressing it, evaluating the pros and cons of each option, selecting and implementing an optional action, and reevaluating and refining the action. Objective Learn and implement calming skills to reduce overall anxiety and manage anxiety symptoms. Target Date: 2023-10-22 Frequency: Biweekly Progress: 60 Modality: individual Related Interventions 1. Assign the client to read about progressive muscle relaxation and other calming strategies in relevant books or treatment manuals (e.g., Progressive Relaxation Training by Robb Matar and Alen Blew; Mastery of Your Anxiety and Worry: Workbook by Earlie Counts). 2. Assign the client homework each session in which he/she practices relaxation exercises daily, gradually applying them progressively from non-anxiety-provoking to anxiety-provoking situations; review and reinforce success while providing corrective feedback toward improvement. 3. Teach the client calming/relaxation skills (e.g., applied relaxation, progressive muscle relaxation, cue controlled relaxation; mindful breathing; biofeedback) and how to discriminate better between relaxation and tension; teach the client how to apply these skills to his/her daily life. 3. Reduce overall frequency, intensity, and duration of the anxiety so that daily functioning is not impaired. 4. Resolve the core conflict that is the source of anxiety. 5. Stabilize anxiety level while increasing ability to function on a daily basis. Diagnosis :    F43.22 Conditions For Discharge Achievement of treatment goals and objectives.  Damaree Sargent, LCSW

## 2023-09-28 ENCOUNTER — Encounter: Payer: Self-pay | Admitting: Internal Medicine

## 2023-09-28 DIAGNOSIS — K7460 Unspecified cirrhosis of liver: Secondary | ICD-10-CM

## 2023-09-28 DIAGNOSIS — Z23 Encounter for immunization: Secondary | ICD-10-CM

## 2023-09-28 DIAGNOSIS — E559 Vitamin D deficiency, unspecified: Secondary | ICD-10-CM

## 2023-09-28 DIAGNOSIS — N5089 Other specified disorders of the male genital organs: Secondary | ICD-10-CM

## 2023-10-05 ENCOUNTER — Other Ambulatory Visit: Payer: Self-pay | Admitting: Cardiology

## 2023-10-05 NOTE — Telephone Encounter (Signed)
 Okay to proceed with: Bone density test, Dx cirrhosis, vitamin D deficiency MMR titers

## 2023-10-05 NOTE — Telephone Encounter (Signed)
 Orders placed.

## 2023-10-06 ENCOUNTER — Other Ambulatory Visit (INDEPENDENT_AMBULATORY_CARE_PROVIDER_SITE_OTHER)

## 2023-10-06 ENCOUNTER — Ambulatory Visit (HOSPITAL_BASED_OUTPATIENT_CLINIC_OR_DEPARTMENT_OTHER)
Admission: RE | Admit: 2023-10-06 | Discharge: 2023-10-06 | Disposition: A | Discharge status: Home / Self Care | Source: Ambulatory Visit | Attending: Internal Medicine | Admitting: Internal Medicine

## 2023-10-06 DIAGNOSIS — Z13820 Encounter for screening for osteoporosis: Secondary | ICD-10-CM | POA: Diagnosis present

## 2023-10-06 DIAGNOSIS — Z23 Encounter for immunization: Secondary | ICD-10-CM

## 2023-10-06 DIAGNOSIS — K7460 Unspecified cirrhosis of liver: Secondary | ICD-10-CM | POA: Insufficient documentation

## 2023-10-06 DIAGNOSIS — N5089 Other specified disorders of the male genital organs: Secondary | ICD-10-CM | POA: Insufficient documentation

## 2023-10-06 DIAGNOSIS — E559 Vitamin D deficiency, unspecified: Secondary | ICD-10-CM | POA: Diagnosis present

## 2023-10-06 NOTE — Addendum Note (Signed)
 Addended by: Mervin Kung A on: 10/06/2023 07:23 AM   Modules accepted: Orders

## 2023-10-07 ENCOUNTER — Ambulatory Visit: Payer: Medicare Other | Admitting: Psychology

## 2023-10-07 DIAGNOSIS — F4322 Adjustment disorder with anxiety: Secondary | ICD-10-CM | POA: Diagnosis not present

## 2023-10-07 LAB — MEASLES/MUMPS/RUBELLA IMMUNITY
Mumps IgG: 36.7 [AU]/ml
Rubella: 9.25 {index}
Rubeola IgG: >300 [AU]/ml

## 2023-10-07 NOTE — Progress Notes (Signed)
 [facility] Counselor/Therapist Progress Note  Patient ID: Gregory Wilkins, MRN: [id number],    Date: 10/07/2023  Time Spent: 9:00am-9:50am   50 minutes   Treatment Type: Individual Therapy  Reported Symptoms: stress  Mental Status Exam: Appearance:  Casual     Behavior: Appropriate  Motor: Normal  Speech/Language:  Normal Rate  Affect: Appropriate  Mood: normal  Thought process: normal  Thought content:   WNL  Sensory/Perceptual disturbances:   WNL  Orientation: oriented to person, place, time/date, and situation  Attention: Good  Concentration: Good  Memory: WNL  Fund of knowledge:  Good  Insight:   Good  Judgment:  Good  Impulse Control: Good   Risk Assessment: Danger to Self:  No Self-injurious Behavior: No Danger to Others: No Duty to Warn:no Physical Aggression / Violence:No  Access to Firearms a concern: No  Gang Involvement:No   Subjective: Pt present for face-to-face individual therapy via video.  Pt consents to telehealth video session and is aware of limitations and benefits of virtual sessions. Location of pt: home Location of therapist: home office.   Pt talked about his family.   Pt's sister in law Gregory Wilkins had a mental health crisis that pt and pt's wife Gregory Wilkins had to help with.   Gregory Wilkins was hospitalized in a psychiatric hospital.  Pt's son Gregory Wilkins has been difficult and Gregory Wilkins kicked him out of the house.   Pt's son is staying with a friend.  Pt worries about his son but needs the respite of having him out of their house.  Gregory Wilkins had a bad mammogram and needs a biopsy.  This has added additional stress and worry.   Pt talked about his concerns about the economy and the country.   Addressed pt's concerns and how he is coping.   Worked on self care strategies.   Provided supportive therapy.  Interventions: Cognitive Behavioral Therapy and Insight-Oriented  Diagnosis: F43.22  Plan of Care: Recommend ongoing therapy.  Pt participated in  setting treatment goals.   He is progressing toward goals.   Plan to continue to meet every two weeks.   Treatment Plan  (Treatment Plan Target Date 10/22/2023) Client Abilities/Strengths  Pt is bright, engaging and motivated for therapy.  Client Treatment Preferences  Individual therapy.  Client Statement of Needs  Improve coping skills.  Symptoms  Autonomic hyperactivity (e.g., palpitations, shortness of breath, dry mouth, trouble swallowing, nausea, diarrhea). Excessive and/or unrealistic worry that is difficult to control occurring more days than not for at least 6 months about a number of events or activities. Hypervigilance (e.g., feeling constantly on edge, experiencing concentration difficulties, having trouble falling or staying asleep, exhibiting a general state of irritability). Motor tension (e.g., restlessness, tiredness, shakiness, muscle tension). Problems Addressed  Anxiety Goals 1. Enhance ability to effectively cope with the full variety of life's worries and anxieties. 2. Learn and implement coping skills that result in a reduction of anxiety and worry, and improved daily functioning. Objective Learn to accept limitations in life and commit to tolerating, rather than avoiding, unpleasant emotions while accomplishing meaningful goals. Target Date: 2023-10-22 Frequency: Biweekly Progress: 60 Modality: individual Related Interventions 1. Use techniques from Acceptance and Commitment Therapy to help client accept uncomfortable realities such as lack of complete control, imperfections, and uncertainty and tolerate unpleasant emotions and thoughts in order to accomplish value-consistent goals. Objective Learn and implement problem-solving strategies for realistically addressing worries. Target Date: 2023-10-22 Frequency: Biweekly Progress: 60 Modality: individual Related Interventions 1. Assign the client  a homework exercise in which he/she problem-solves a current problem.   review, reinforce success, and provide corrective feedback toward improvement. 2. Teach the client problem-solving strategies involving specifically defining a problem, generating options for addressing it, evaluating the pros and cons of each option, selecting and implementing an optional action, and reevaluating and refining the action. Objective Learn and implement calming skills to reduce overall anxiety and manage anxiety symptoms. Target Date: 2023-10-22 Frequency: Biweekly Progress: 60 Modality: individual Related Interventions 1. Assign the client to read about progressive muscle relaxation and other calming strategies in relevant books or treatment manuals (e.g., Progressive Relaxation Training by Robb Matar and Alen Blew; Mastery of Your Anxiety and Worry: Workbook by Earlie Counts). 2. Assign the client homework each session in which he/she practices relaxation exercises daily, gradually applying them progressively from non-anxiety-provoking to anxiety-provoking situations; review and reinforce success while providing corrective feedback toward improvement. 3. Teach the client calming/relaxation skills (e.g., applied relaxation, progressive muscle relaxation, cue controlled relaxation; mindful breathing; biofeedback) and how to discriminate better between relaxation and tension; teach the client how to apply these skills to his/her daily life. 3. Reduce overall frequency, intensity, and duration of the anxiety so that daily functioning is not impaired. 4. Resolve the core conflict that is the source of anxiety. 5. Stabilize anxiety level while increasing ability to function on a daily basis. Diagnosis :    F43.22 Conditions For Discharge Achievement of treatment goals and objectives.  Nevelyn Mellott, LCSW

## 2023-10-08 ENCOUNTER — Encounter: Payer: Self-pay | Admitting: Internal Medicine

## 2023-10-21 ENCOUNTER — Ambulatory Visit (INDEPENDENT_AMBULATORY_CARE_PROVIDER_SITE_OTHER): Payer: Medicare Other | Admitting: Psychology

## 2023-10-21 DIAGNOSIS — F4322 Adjustment disorder with anxiety: Secondary | ICD-10-CM

## 2023-10-21 NOTE — Progress Notes (Signed)
 [facility] Counselor/Therapist Progress Note  Patient ID: Gregory ARNETTE, MRN: [id number],    Date: 10/21/2023  Time Spent: 9:00am-9:50am   50 minutes   Treatment Type: Individual Therapy  Reported Symptoms: stress  Mental Status Exam: Appearance:  Casual     Behavior: Appropriate  Motor: Normal  Speech/Language:  Normal Rate  Affect: Appropriate  Mood: normal  Thought process: normal  Thought content:   WNL  Sensory/Perceptual disturbances:   WNL  Orientation: oriented to person, place, time/date, and situation  Attention: Good  Concentration: Good  Memory: WNL  Fund of knowledge:  Good  Insight:   Good  Judgment:  Good  Impulse Control: Good   Risk Assessment: Danger to Self:  No Self-injurious Behavior: No Danger to Others: No Duty to Warn:no Physical Aggression / Violence:No  Access to Firearms a concern: No  Gang Involvement:No   Subjective: Pt present for face-to-face individual therapy via video.  Pt consents to telehealth video session and is aware of limitations and benefits of virtual sessions. Location of pt: home Location of therapist: home office.   Pt talked about his family.  Pt's son came home after being gone for a week.  He was suppose to only come home to take a shower and get clothes but he ended up not leaving.  Pt's wife is upset about this and pressuring pt to deal with their son.   Pt has had conversations with him and states his son is looking at rehab programs but pt does not feel hopeful that his son will follow through with going.   Pt feels exhausted with the cyclical nature of the problem and family dynamics.  Helped pt process his feelings.   Pt talked about his concerns about the economy and the country.   Pt is worried and fearful about how this could affect his finances. Addressed pt's concerns and how he is coping.    Pt goes to the gym every day and stays active.  This is a healthy coping strategy for him.   Worked  on self care strategies.   Provided supportive therapy.  Interventions: Cognitive Behavioral Therapy and Insight-Oriented  Diagnosis: F43.22  Plan of Care: Recommend ongoing therapy.  Pt participated in setting treatment goals.   He is progressing toward goals.   Plan to continue to meet every two weeks.   Treatment Plan  (Treatment Plan Target Date 10/22/2023) Client Abilities/Strengths  Pt is bright, engaging and motivated for therapy.  Client Treatment Preferences  Individual therapy.  Client Statement of Needs  Improve coping skills.  Symptoms  Autonomic hyperactivity (e.g., palpitations, shortness of breath, dry mouth, trouble swallowing, nausea, diarrhea). Excessive and/or unrealistic worry that is difficult to control occurring more days than not for at least 6 months about a number of events or activities. Hypervigilance (e.g., feeling constantly on edge, experiencing concentration difficulties, having trouble falling or staying asleep, exhibiting a general state of irritability). Motor tension (e.g., restlessness, tiredness, shakiness, muscle tension). Problems Addressed  Anxiety Goals 1. Enhance ability to effectively cope with the full variety of life's worries and anxieties. 2. Learn and implement coping skills that result in a reduction of anxiety and worry, and improved daily functioning. Objective Learn to accept limitations in life and commit to tolerating, rather than avoiding, unpleasant emotions while accomplishing meaningful goals. Target Date: 2023-10-22 Frequency: Biweekly Progress: 60 Modality: individual Related Interventions 1. Use techniques from Acceptance and Commitment Therapy to help client accept uncomfortable realities such as lack  of complete control, imperfections, and uncertainty and tolerate unpleasant emotions and thoughts in order to accomplish value-consistent goals. Objective Learn and implement problem-solving strategies for realistically  addressing worries. Target Date: 2023-10-22 Frequency: Biweekly Progress: 60 Modality: individual Related Interventions 1. Assign the client a homework exercise in which he/she problem-solves a current problem.  review, reinforce success, and provide corrective feedback toward improvement. 2. Teach the client problem-solving strategies involving specifically defining a problem, generating options for addressing it, evaluating the pros and cons of each option, selecting and implementing an optional action, and reevaluating and refining the action. Objective Learn and implement calming skills to reduce overall anxiety and manage anxiety symptoms. Target Date: 2023-10-22 Frequency: Biweekly Progress: 60 Modality: individual Related Interventions 1. Assign the client to read about progressive muscle relaxation and other calming strategies in relevant books or treatment manuals (e.g., Progressive Relaxation Training by Robb Matar and Alen Blew; Mastery of Your Anxiety and Worry: Workbook by Earlie Counts). 2. Assign the client homework each session in which he/she practices relaxation exercises daily, gradually applying them progressively from non-anxiety-provoking to anxiety-provoking situations; review and reinforce success while providing corrective feedback toward improvement. 3. Teach the client calming/relaxation skills (e.g., applied relaxation, progressive muscle relaxation, cue controlled relaxation; mindful breathing; biofeedback) and how to discriminate better between relaxation and tension; teach the client how to apply these skills to his/her daily life. 3. Reduce overall frequency, intensity, and duration of the anxiety so that daily functioning is not impaired. 4. Resolve the core conflict that is the source of anxiety. 5. Stabilize anxiety level while increasing ability to function on a daily basis. Diagnosis :    F43.22 Conditions For Discharge Achievement of treatment goals and  objectives.  Mata Rowen, LCSW

## 2023-11-04 ENCOUNTER — Ambulatory Visit: Payer: Medicare Other | Admitting: Psychology

## 2023-11-18 ENCOUNTER — Ambulatory Visit (INDEPENDENT_AMBULATORY_CARE_PROVIDER_SITE_OTHER): Payer: Medicare Other | Admitting: Psychology

## 2023-11-18 DIAGNOSIS — F4322 Adjustment disorder with anxiety: Secondary | ICD-10-CM

## 2023-11-18 NOTE — Progress Notes (Signed)
 [facility] Counselor Initial Adult Exam  Name: Gregory Wilkins Date: 11/18/2023 MRN: [id number] DOB: 1940-12-21 PCP: Ezell Hollow, MD  Time spent: 9:00am - 9:50am  50 minutes  Guardian/Payee:  n/a    Paperwork requested: No   Reason for Visit /Presenting Problem:  Pt present for face-to-face initial assessment update via video.  Pt consents to telehealth video session and is aware of limitations and benefits of virtual sessions.   Location of pt:  home Location of therapist: home office. Pt continues to have anxiety at times related to family issues and challenges of aging.   Pt is interested in coming to counseling to "vent" and get some advice.   Pt has significant family issues related to his son being a drug addict and living with him and wife.   Addressed how pt copes and worked on additional coping strategies.       Worked on increasing self care.  Provided supportive therapy.   Reviewed pt's treatment plan for annual update.   Updated treatment plan and IA.  Pt participated in setting treatment goals.   Plan to continue to meet every two weeks.      Mental Status Exam: Appearance:   Casual     Behavior:  Appropriate  Motor:  Normal  Speech/Language:   Normal Rate  Affect:  Appropriate  Mood:  normal  Thought process:  normal  Thought content:    WNL  Sensory/Perceptual disturbances:    WNL  Orientation:  oriented to person, place, time/date, and situation  Attention:  Good  Concentration:  Good  Memory:  WNL  Fund of knowledge:   Good  Insight:    Good  Judgment:   Good  Impulse Control:  Good    Reported Symptoms:  stress  Risk Assessment: Danger to Self:  No Self-injurious Behavior: No Danger to Others: No Duty to Warn:no Physical Aggression / Violence:No  Access to Firearms a concern: No  Gang Involvement:No  Patient / guardian was educated about steps to take if suicide or homicide risk level increases between visits: n/a While  future psychiatric events cannot be accurately predicted, the patient does not currently require acute inpatient psychiatric care and does not currently meet [place]  involuntary commitment criteria.  Substance Abuse History: Current substance abuse: No     Past Psychiatric History:   Previous psychological history is significant for anxiety Outpatient Providers:in therapy in the past with Dr. Harles Lied History of Psych Hospitalization: No  Psychological Testing:  n/a    Abuse History:  Victim of: No.,  n/a    Report needed: No. Victim of Neglect:No. Perpetrator of  n/a   Witness / Exposure to Domestic Violence: No   Protective Services Involvement: No  Witness to [employer] Violence:  No   Family History:  Family History  Problem Relation Age of Onset   Stroke Mother 68   Hypertension Mother    Alcohol abuse Mother    Obesity Mother    Diabetes Father    Kidney disease Father    Heart disease Neg Hx    Cancer Neg Hx     Living situation:    Pt lives with wife.  Their adult son lives with them.  Pt grew up with both parents.  Father was an Product/process development scientist.  Pt had 2 siblings.  Brother died 9 years ago.  Mother died 4 months after that.  Father died 6 years ago.  Pt's sister is still living and they have  a good relationship.   Pt's mother was alcoholic.  Maternal grandfather died of the flu in 02-Dec-1916.  Maternal grandmother was alcoholic .  Mother was raised by a second cousin.  Father's family were "hillbillies."   Several were alcoholics.   Pt has a first cousin who is paranoid schizophrenic.   No family history of depression or anxiety.   Pt's family has history of alcoholism.    Sexual Orientation: Straight  Relationship Status: married  Name of spouse / other:Gregory Wilkins If a parent, number of children / ages:one adult son  Support Systems: friends Pt considers therapy part of his support system.   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 History of legal issue / charges:  n/a  Medical History/Surgical History: reviewed Past Medical History:  Diagnosis Date   Acne rosacea 12/23/2006   Qualifier: Diagnosis of  By: Donnice Gale MD, Sammie Crigler   Dr Wolm Hawthorne   Formatting of this note might be different from the original. Overview:  Qualifier: Diagnosis of  By: Donnice Gale MD, Sammie Crigler   Dr Wolm Hawthorne Overview:  Overview:  Qualifier: Diagnosis of  By: Donnice Gale MD, Sammie Crigler   Dr Wolm Hawthorne  Overview:  Qualifier: Diagnosis of  By: Donnice Gale MD, Sammie Crigler   Dr Wolm Hawthorne Overview:  Overview:  Overview:   Acute meniscal tear, lateral 10/01/2021   Alcohol abuse    Allergic rhinitis 12/23/2006   Qualifier: Diagnosis of  By: Donnice Gale MD, Sammie Crigler   Onset:in high school Character: perennial but increased seasonally Triggers :cat, dust, pollen Allergy testing: Dr Landrum Pink Maintenance medications/ response:Flonase/ Nasonex; saline wash; Allegra Smoking history:age 26- 42, up to 2 ppd Family history pulmonary disease: no    Formatting of this note might be different from the original. 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 Overview:  Qualifier: Diagnosis of  By: Donnice Gale MD, Fayetta Hoose of this note might be different from the original. Overview:  Qualifier: Diagnosis of  By: Donnice Gale MD, William  IMO 10/01 Updates   Enlarged prostate without lower urinary tract symptoms (luts) 11/09/2008   Formatting of this note might be different from the original. Overview:  Qualifier: Diagnosis of  By: Donnice Gale MD, Sammie Crigler   No PMH of elevated PSA ; no biopsy Overview:  Overview:  Qualifier: Diagnosis of  By: Donnice Gale MD, Sammie Crigler   No PMH of elevated PSA ; no biopsy Overview:  Overview:  Overview:  Qualifier: Diagnosis of  By: Donnice Gale MD, Sammie Crigler   No PMH of elevated PSA ; no biopsy   Essential (primary) hypertension 09/21/2007   Qualifier: Diagnosis of  By: Donnice Gale MD, Fayetta Hoose of this note might be different from the original. Overview:  Qualifier: Diagnosis of  By: Donnice Gale MD, Sammie Crigler   Last Assessment & Plan:  Blood pressure appears to be well controlled. Renal function will be checked.  He has a minor cough; this does not appear to be significant enough to warrant change to losartan  Overview:  Overview:  Qu   Family history of type C viral hepatitis 06/30/2012   Brother had liver transplant    Hepatitis A 04/2019   History of colonic polyps 12/23/2006   Qualifier: Diagnosis of  By: Donnice Gale MD, Sammie Crigler   Dr Connie Deist, GI Polypectomy X2 , 4/16 /2014. One was  transitional cell adenoma Due annually No FH colon cancer   Formatting of this note might be different from the original. Overview:  Qualifier: Diagnosis of  By: Donnice Gale MD, Sammie Crigler   Dr Connie Deist, GI Polypectomy X2 , 4/16 /2014. One was  transitional cell adenoma Due annually No FH colon ca   History of urinary  stone 09/21/2007   Qualifier: Diagnosis of  By: Donnice Gale MD, Claudene Crystal , passed spontaneously No Urologist involvement   Formatting of this note might be different from the original. Overview:  Qualifier: Diagnosis of  By: Donnice Gale MD, Claudene Crystal , passed spontaneously No Urologist involvement Overview:  Overview:  Qualifier: Diagnosis of  By: Donnice Gale MD, Claudene Crystal , passed spontaneously No Urologist involvement    HYPERLIPIDEMIA 09/21/2007   Qualifier: Diagnosis of  By: Donnice Gale MD, [place] Lipoprofile 2006 : LDL  108 ( 1072  / 575  ),  HDL 63 , Triglycerides  40. LDL goal = < 135 ; ideally < 105. No FH CAD   Formatting of this note might be different from the original. Overview:  Qualifier: Diagnosis of  By: Donnice Gale MD, [place] Lipoprofile 2006 : LDL  108 ( 1072  / 575  ),  HDL 63 , Triglycerides  40. LDL goal = < 135 ; idea   Hypertension    Joint pain    Liver injury, initial encounter 05/17/2019   Macular degeneration 01/06/2018   Malignant neoplasm of skin    Mallet finger 2012     4th L DIP,Dr Sypher   Nephrolithiasis    Nocturnal hypoxia 11/04/2019   Nuclear sclerotic cataract of both eyes    Obesity (BMI 30-39.9) 01/04/2014   OSA on CPAP 05/17/2020   Osteoarthritis    PCP NOTES >>>>>>>>>>>>>>>> 05/26/2019   Personal history of other malignant neoplasm of skin 06/30/2012   Dr Marcia Setters, Derm X3 Local resection from  right forearm and forehead.   Mohs surgery of the nose 06/2012, Dr Elfredia Grippe of this note might be different from the original. Overview:  Dr Marcia Setters, Derm X3 Local resection from  right forearm and forehead.   Mohs surgery of the nose 06/2012, Dr Jerilynn Montenegro Overview:  Overview:  Dr Marcia Setters, Derm X3 Local resection from  right forearm and fo   RLS (restless legs syndrome)     Past Surgical History:  Procedure Laterality Date   basal cell cancer     X 3   INGUINAL HERNIA REPAIR  [age]   right,left  OPEN REDUCTION INTERNAL FIXATION (ORIF)  METACARPAL Right 05/19/2013   Procedure: OPEN REDUCTION INTERNAL FIXATION  RIGHT SMALL  METACARPAL FRACTURE;  Surgeon: Amelie Baize., MD;  Location: [facility];  Service: Orthopedics;  Laterality: Right;   POLYPECTOMY     X 3; Dr Andriette Keeling   WISDOM TOOTH EXTRACTION      Medications: Current Outpatient Medications  Medication Sig Dispense Refill   atorvastatin  (LIPITOR) 10 MG tablet Take 1 tablet (10 mg total) by mouth at bedtime. 90 tablet 1   aspirin  EC 81 MG tablet Take 1 tablet (81 mg total) by mouth daily. Swallow whole. 90 tablet 3   cetirizine (ZYRTEC) 10 MG tablet Take 10 mg by mouth daily.     cholecalciferol (VITAMIN D3) 25 MCG (1000 UNIT) tablet Take 1,000 Units by mouth daily.     clonazePAM  (KLONOPIN ) 0.5 MG tablet TAKE 1 TABLET BY MOUTH EVERY NIGHT AT BEDTIME AS NEEDED FOR RESTLESS LEG 90 tablet 1   ELIQUIS  5 MG TABS tablet TAKE 1 TABLET BY MOUTH TWICE A DAY 180 tablet 1   hydrochlorothiazide  (HYDRODIURIL ) 12.5 MG tablet Take 1 tablet (12.5 mg total) by mouth daily. 90 tablet 1   losartan  (COZAAR ) 25 MG tablet TAKE 1 TABLET (25 MG TOTAL) BY MOUTH DAILY. 90 tablet 0   metoprolol  tartrate (LOPRESSOR ) 50 MG tablet Take 12.5 mg by mouth as needed for heart rate control (fast heart rate).     Multiple Vitamin (MULTIVITAMIN WITH MINERALS) TABS tablet Take 1 tablet by mouth daily.     Multiple Vitamins-Minerals (ICAPS AREDS 2 PO) Take 1 tablet by mouth daily in the afternoon.     No current facility-administered medications for this visit.    Allergies  Allergen Reactions   Penicillins Hives    hives   Ramipril  Anaphylaxis and Swelling    Diagnoses:  F43.22  Plan of Care: Pt participated in setting treatment goals.   He is progressing toward goals.  Pt  Plan to continue to meet every two weeks. Pt agrees with treatment plan.  Treatment Plan  (Treatment Plan Target Date 11/17/2024) Client Abilities/Strengths  Pt is bright, engaging and motivated for  therapy.  Client Treatment Preferences  Individual therapy.  Client Statement of Needs  Improve coping skills.  Symptoms  Autonomic hyperactivity (e.g., palpitations, shortness of breath, dry mouth, trouble swallowing, nausea, diarrhea). Excessive and/or unrealistic worry that is difficult to control occurring more days than not for at least 6 months about a number of events or activities. Hypervigilance (e.g., feeling constantly on edge, experiencing concentration difficulties, having trouble falling or staying asleep, exhibiting a general state of irritability). Motor tension (e.g., restlessness, tiredness, shakiness, muscle tension). Problems Addressed  Anxiety Goals 1. Enhance ability to effectively cope with the full variety of life's worries and anxieties. 2. Learn and implement coping skills that result in a reduction of anxiety and worry, and improved daily functioning. Objective Learn to accept limitations in life and commit to tolerating, rather than avoiding, unpleasant emotions while accomplishing meaningful goals. Target Date: 2024-11-17 Frequency: Biweekly Progress: 65 Modality: individual Related Interventions 1. Use techniques from Acceptance and Commitment Therapy to help client accept uncomfortable realities such as lack of complete control, imperfections, and uncertainty and tolerate unpleasant emotions and thoughts in order to accomplish value-consistent goals. Objective Learn and implement problem-solving strategies for realistically addressing worries. Target Date: 2024-11-17 Frequency: Biweekly Progress: 65 Modality: individual Related Interventions 1. Assign the client a homework exercise in which he/she problem-solves a  current problem.  review, reinforce success, and provide corrective feedback toward improvement. 2. Teach the client problem-solving strategies involving specifically defining a problem, generating options for addressing it, evaluating the pros and  cons of each option, selecting and implementing an optional action, and reevaluating and refining the action. Objective Learn and implement calming skills to reduce overall anxiety and manage anxiety symptoms. Target Date: 2024-11-17 Frequency: Biweekly Progress: 65 Modality: individual Related Interventions 1. Assign the client to read about progressive muscle relaxation and other calming strategies in relevant books or treatment manuals (e.g., Progressive Relaxation Training by Rodolfo Clan and Arvil Birks; Mastery of Your Anxiety and Worry: Workbook by Rodney Clamp). 2. Assign the client homework each session in which he/she practices relaxation exercises daily, gradually applying them progressively from non-anxiety-provoking to anxiety-provoking situations; review and reinforce success while providing corrective feedback toward improvement. 3. Teach the client calming/relaxation skills (e.g., applied relaxation, progressive muscle relaxation, cue controlled relaxation; mindful breathing; biofeedback) and how to discriminate better between relaxation and tension; teach the client how to apply these skills to his/her daily life. 3. Reduce overall frequency, intensity, and duration of the anxiety so that daily functioning is not impaired. 4. Resolve the core conflict that is the source of anxiety. 5. Stabilize anxiety level while increasing ability to function on a daily basis. Diagnosis :    F43.22 Conditions For Discharge Achievement of treatment goals and objectives.    Myiah Petkus, LCSW

## 2023-12-02 ENCOUNTER — Ambulatory Visit (INDEPENDENT_AMBULATORY_CARE_PROVIDER_SITE_OTHER): Payer: Medicare Other | Admitting: Psychology

## 2023-12-02 DIAGNOSIS — F4322 Adjustment disorder with anxiety: Secondary | ICD-10-CM | POA: Diagnosis not present

## 2023-12-02 NOTE — Progress Notes (Signed)
  Behavioral Health Counselor/Therapist Progress Note  Patient ID: BAYLEY BEAMESDERFER, MRN: [id number],    Date: 12/02/2023  Time Spent: 9:00am-9:50am  50 minutes   Treatment Type: Individual Therapy  Reported Symptoms: stress  Mental Status Exam: Appearance:  Casual     Behavior: Appropriate  Motor: Normal  Speech/Language:  Normal Rate  Affect: Appropriate  Mood: normal  Thought process: normal  Thought content:   WNL  Sensory/Perceptual disturbances:   WNL  Orientation: oriented to person, place, time/date, and situation  Attention: Good  Concentration: Good  Memory: WNL  Fund of knowledge:  Good  Insight:   Good  Judgment:  Good  Impulse Control: Good   Risk Assessment: Danger to Self:  No Self-injurious Behavior: No Danger to Others: No Duty to Warn:no Physical Aggression / Violence:No  Access to Firearms a concern: No  Gang Involvement:No   Subjective: Pt present for face-to-face individual therapy via video.  Pt consents to telehealth video session and is aware of limitations and benefits of virtual sessions.  Location of pt: home Location of therapist: home office.   Pt talked about an argument he had with his wife Abe Abed.  She was complaining about how he was doing something she asked him to do.  He got frustrated and raised his voice.  He then felt badly about the argument and about his reaction.  Helped pt process his feelings and relationship dynamics. Pt has also had issues with his son Danil the past two weeks.  Macallister is back home bc Abe Abed let him back in.  Terren's mental illness and substance abuse are very challenging to deal with.   Addressed how this impacts pt.   Pt worries about what will happen to Bode when pt passes.  He is working on how to set up his trust so Jibri won't be able to be irresponsible with the money.  Pt talked about his health.  He has gained 30 lbs and saw a doctor who is prescribing Ozempic to help him lose weight.  Pt wants to  work on his emotional eating in therapy.  Worked on self care strategies. Provided supportive therapy.  Interventions: Cognitive Behavioral Therapy and Insight-Oriented  Diagnosis:  F43.22  Plan of Care: Pt participated in setting treatment goals.   He is progressing toward goals.  Pt  Plan to continue to meet every two weeks. Pt agrees with treatment plan.  Treatment Plan  (Treatment Plan Target Date 11/17/2024) Client Abilities/Strengths  Pt is bright, engaging and motivated for therapy.  Client Treatment Preferences  Individual therapy.  Client Statement of Needs  Improve coping skills.  Symptoms  Autonomic hyperactivity (e.g., palpitations, shortness of breath, dry mouth, trouble swallowing, nausea, diarrhea). Excessive and/or unrealistic worry that is difficult to control occurring more days than not for at least 6 months about a number of events or activities. Hypervigilance (e.g., feeling constantly on edge, experiencing concentration difficulties, having trouble falling or staying asleep, exhibiting a general state of irritability). Motor tension (e.g., restlessness, tiredness, shakiness, muscle tension). Problems Addressed  Anxiety Goals 1. Enhance ability to effectively cope with the full variety of life's worries and anxieties. 2. Learn and implement coping skills that result in a reduction of anxiety and worry, and improved daily functioning. Objective Learn to accept limitations in life and commit to tolerating, rather than avoiding, unpleasant emotions while accomplishing meaningful goals. Target Date: 2024-11-17 Frequency: Biweekly Progress: 65 Modality: individual Related Interventions 1. Use techniques from Acceptance and Commitment Therapy to  help client accept uncomfortable realities such as lack of complete control, imperfections, and uncertainty and tolerate unpleasant emotions and thoughts in order to accomplish value-consistent goals. Objective Learn and implement  problem-solving strategies for realistically addressing worries. Target Date: 2024-11-17 Frequency: Biweekly Progress: 65 Modality: individual Related Interventions 1. Assign the client a homework exercise in which he/she problem-solves a current problem.  review, reinforce success, and provide corrective feedback toward improvement. 2. Teach the client problem-solving strategies involving specifically defining a problem, generating options for addressing it, evaluating the pros and cons of each option, selecting and implementing an optional action, and reevaluating and refining the action. Objective Learn and implement calming skills to reduce overall anxiety and manage anxiety symptoms. Target Date: 2024-11-17 Frequency: Biweekly Progress: 65 Modality: individual Related Interventions 1. Assign the client to read about progressive muscle relaxation and other calming strategies in relevant books or treatment manuals (e.g., Progressive Relaxation Training by Rodolfo Clan and Arvil Birks; Mastery of Your Anxiety and Worry: Workbook by Rodney Clamp). 2. Assign the client homework each session in which he/she practices relaxation exercises daily, gradually applying them progressively from non-anxiety-provoking to anxiety-provoking situations; review and reinforce success while providing corrective feedback toward improvement. 3. Teach the client calming/relaxation skills (e.g., applied relaxation, progressive muscle relaxation, cue controlled relaxation; mindful breathing; biofeedback) and how to discriminate better between relaxation and tension; teach the client how to apply these skills to his/her daily life. 3. Reduce overall frequency, intensity, and duration of the anxiety so that daily functioning is not impaired. 4. Resolve the core conflict that is the source of anxiety. 5. Stabilize anxiety level while increasing ability to function on a daily basis. Diagnosis :    F43.22 Conditions For  Discharge Achievement of treatment goals and objectives.   Joanell Cressler, LCSW

## 2023-12-04 ENCOUNTER — Encounter: Payer: Self-pay | Admitting: Cardiology

## 2023-12-04 ENCOUNTER — Ambulatory Visit: Attending: Cardiology | Admitting: Cardiology

## 2023-12-04 VITALS — BP 118/62 | HR 56 | Ht 68.6 in | Wt 218.1 lb

## 2023-12-04 DIAGNOSIS — G4733 Obstructive sleep apnea (adult) (pediatric): Secondary | ICD-10-CM | POA: Insufficient documentation

## 2023-12-04 DIAGNOSIS — I10 Essential (primary) hypertension: Secondary | ICD-10-CM | POA: Diagnosis present

## 2023-12-04 DIAGNOSIS — E782 Mixed hyperlipidemia: Secondary | ICD-10-CM | POA: Diagnosis present

## 2023-12-04 DIAGNOSIS — I4892 Unspecified atrial flutter: Secondary | ICD-10-CM | POA: Diagnosis present

## 2023-12-04 DIAGNOSIS — I2510 Atherosclerotic heart disease of native coronary artery without angina pectoris: Secondary | ICD-10-CM | POA: Insufficient documentation

## 2023-12-04 DIAGNOSIS — E669 Obesity, unspecified: Secondary | ICD-10-CM | POA: Diagnosis present

## 2023-12-04 NOTE — Patient Instructions (Signed)

## 2023-12-04 NOTE — Progress Notes (Signed)
 Cardiology Office Note:    Date:  12/04/2023   ID:  Gregory Wilkins, DOB 1940-12-24, MRN [id number]  PCP:  Ezell Hollow, MD  Cardiologist:  Nelia Balzarine, MD   Referring MD: Ezell Hollow, MD    ASSESSMENT:    1. Atrial flutter, paroxysmal (HCC)   2. Coronary artery calcification   3. Essential (primary) hypertension   4. OSA on CPAP   5. HYPERLIPIDEMIA   6. Obesity (BMI 30-39.9)    PLAN:    In order of problems listed above:  Coronary artery calcification: Secondary prevention stressed with the patient.  Importance of compliance with diet medication stressed any vocalized understanding.  He was advised to walk at least half an hour a day on a daily basis. Paroxysmal atrial flutter:I discussed with the patient atrial flutter, disease process. Management and therapy including rate and rhythm control, anticoagulation benefits and potential risks were discussed extensively with the patient. Patient had multiple questions which were answered to patient's satisfaction. Essential hypertension: Blood pressure stable and diet was emphasized. Mixed dyslipidemia and obesity: Lifestyle modification diet emphasized.  Goal LDL is less than 60 and I reviewed lab work and discussed with him at length.  Weight reduction stressed diet emphasized.  Risks of obesity explained and he promises to do better. Patient will be seen in follow-up appointment in 12 months or earlier if the patient has any concerns.    Medication Adjustments/Labs and Tests Ordered: Current medicines are reviewed at length with the patient today.  Concerns regarding medicines are outlined above.  No orders of the defined types were placed in this encounter.  No orders of the defined types were placed in this encounter.    No chief complaint on file.    History of Present Illness:    Gregory Wilkins is a 83 y.o. male.  Patient has past medical history of paroxysmal atrial flutter, essential hypertension,  coronary artery calcification, mixed dyslipidemia and obesity.  He denies any problems at this time and takes care of activities of daily living.  No chest pain orthopnea or PND.  No palpitations.  At the time of my evaluation, the patient is alert awake oriented and in no distress.  Past Medical History:  Diagnosis Date   Acne rosacea 12/23/2006   Qualifier: Diagnosis of  By: Donnice Gale MD, Sammie Crigler   Dr Wolm Hawthorne   Formatting of this note might be different from the original. Overview:  Qualifier: Diagnosis of  By: Donnice Gale MD, Sammie Crigler   Dr Wolm Hawthorne Overview:  Overview:  Qualifier: Diagnosis of  By: Donnice Gale MD, Sammie Crigler   Dr Wolm Hawthorne  Overview:  Qualifier: Diagnosis of  By: Donnice Gale MD, Sammie Crigler   Dr Wolm Hawthorne Overview:  Overview:  Overview:   Acute meniscal tear, lateral 10/01/2021   Alcohol abuse    Allergic rhinitis 12/23/2006   Qualifier: Diagnosis of  By: Donnice Gale MD, Sammie Crigler   Onset:in high school Character: perennial but increased seasonally Triggers :cat, dust, pollen Allergy testing: Dr Landrum Pink Maintenance medications/ response:Flonase/ Nasonex; saline wash; Allegra Smoking history:age 86- 42, up to 2 ppd Family history pulmonary disease: no    Formatting of this note might be different from the original. 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 Overview:  Qualifier: Diagnosis of  By: Donnice Gale MD, Fayetta Hoose of this note might be different from the original. Overview:  Qualifier: Diagnosis of  By: Donnice Gale MD, William  IMO 10/01 Updates   Enlarged prostate without lower urinary tract symptoms (luts) 11/09/2008   Formatting of this note might be different from the original. Overview:  Qualifier: Diagnosis of  By: Donnice Gale MD, Sammie Crigler   No PMH of elevated PSA ; no biopsy Overview:  Overview:  Qualifier: Diagnosis of  By: Donnice Gale MD, Sammie Crigler   No PMH of elevated PSA ; no biopsy Overview:  Overview:  Overview:  Qualifier: Diagnosis of  By: Donnice Gale MD, Sammie Crigler   No PMH of elevated PSA ; no biopsy   Essential (primary) hypertension 09/21/2007   Qualifier: Diagnosis of  By: Donnice Gale MD, Fayetta Hoose of this note might be different from the original. Overview:  Qualifier: Diagnosis of  By: Donnice Gale MD, Sammie Crigler   Last Assessment & Plan:  Blood pressure appears to be well controlled. Renal function will be checked.  He has a minor cough; this does not appear to be significant enough to warrant change to losartan  Overview:  Overview:  Qu   Family history of type C viral hepatitis 06/30/2012   Brother had liver transplant    Hepatitis A 04/2019   History of colonic polyps 12/23/2006   Qualifier: Diagnosis of  By: Donnice Gale MD, Sammie Crigler   Dr Connie Deist, GI Polypectomy X2 , 4/16 /2014. One was  transitional cell adenoma Due annually No FH colon cancer   Formatting of this note might be different from the original. Overview:  Qualifier: Diagnosis of  By: Donnice Gale MD, Sammie Crigler   Dr Connie Deist, GI Polypectomy X2 , 4/16 /2014. One was  transitional cell adenoma Due annually No FH colon ca   History of urinary stone 09/21/2007   Qualifier: Diagnosis of  By: Donnice Gale MD, Claudene Crystal , passed spontaneously No Urologist involvement   Formatting of this note might be different from the original. Overview:  Qualifier: Diagnosis of  By: Donnice Gale MD, Claudene Crystal , passed spontaneously No Urologist involvement Overview:  Overview:   Qualifier: Diagnosis of  By: Donnice Gale MD, Claudene Crystal , passed spontaneously No Urologist involvement    HYPERLIPIDEMIA 09/21/2007   Qualifier: Diagnosis of  By: Donnice Gale MD, [place] Lipoprofile 2006 : LDL  108 ( 1072  / 575  ),  HDL 63 , Triglycerides  40. LDL goal = < 135 ; ideally < 105. No FH CAD   Formatting of this note might be different from the original. Overview:  Qualifier: Diagnosis of  By: Donnice Gale MD, [place] Lipoprofile 2006 : LDL  108 ( 1072  / 575  ),  HDL 63 , Triglycerides  40. LDL goal = < 135 ; idea   Hypertension    Joint pain    Liver injury, initial encounter 05/17/2019   Macular degeneration 01/06/2018   Malignant neoplasm of skin    Mallet finger 2012  4th L DIP,Dr Sypher   Nephrolithiasis    Nocturnal hypoxia 11/04/2019   Nuclear sclerotic cataract of both eyes    Obesity (BMI 30-39.9) 01/04/2014   OSA on CPAP 05/17/2020   Osteoarthritis    PCP NOTES >>>>>>>>>>>>>>>> 05/26/2019   Personal history of other malignant neoplasm of skin 06/30/2012   Dr Marcia Setters, Derm X3 Local resection from  right forearm and forehead.   Mohs surgery of the nose 06/2012, Dr Elfredia Grippe of this note might be different from the original. Overview:  Dr Marcia Setters, Derm X3 Local resection from  right forearm and forehead.   Mohs surgery of the nose 06/2012, Dr Jerilynn Montenegro Overview:  Overview:  Dr Marcia Setters, Derm X3 Local resection from  right forearm and fo   RLS (restless legs syndrome)     Past Surgical History:  Procedure Laterality Date   basal cell cancer     X 3   INGUINAL HERNIA REPAIR  [age]   right,left   OPEN REDUCTION INTERNAL FIXATION (ORIF) METACARPAL Right 05/19/2013   Procedure: OPEN REDUCTION INTERNAL FIXATION  RIGHT SMALL  METACARPAL FRACTURE;  Surgeon: Amelie Baize., MD;  Location: [facility];  Service: Orthopedics;  Laterality: Right;   POLYPECTOMY     X 3; Dr Andriette Keeling   WISDOM TOOTH EXTRACTION      Current  Medications: Current Meds  Medication Sig   aspirin  EC 81 MG tablet Take 1 tablet (81 mg total) by mouth daily. Swallow whole.   atorvastatin  (LIPITOR) 10 MG tablet Take 1 tablet (10 mg total) by mouth at bedtime.   b complex vitamins capsule Take 1 capsule by mouth daily.   cetirizine (ZYRTEC) 10 MG tablet Take 10 mg by mouth daily.   cholecalciferol (VITAMIN D3) 25 MCG (1000 UNIT) tablet Take 1,000 Units by mouth daily.   clonazePAM  (KLONOPIN ) 0.5 MG tablet TAKE 1 TABLET BY MOUTH EVERY NIGHT AT BEDTIME AS NEEDED FOR RESTLESS LEG   ELIQUIS  5 MG TABS tablet TAKE 1 TABLET BY MOUTH TWICE A DAY   hydrochlorothiazide  (HYDRODIURIL ) 12.5 MG tablet Take 1 tablet (12.5 mg total) by mouth daily.   losartan  (COZAAR ) 25 MG tablet TAKE 1 TABLET (25 MG TOTAL) BY MOUTH DAILY.   metoprolol  tartrate (LOPRESSOR ) 50 MG tablet Take 12.5 mg by mouth as needed for heart rate control (fast heart rate).   Multiple Vitamin (MULTIVITAMIN WITH MINERALS) TABS tablet Take 1 tablet by mouth daily.   Multiple Vitamins-Minerals (ICAPS AREDS 2 PO) Take 1 tablet by mouth daily in the afternoon.     Allergies:   Penicillins and Ramipril    Social History   Socioeconomic History   Marital status: Married    Spouse name: Abe Abed    Number of children: Not on file   Years of education: Not on file   Highest education level: Master's degree (e.g., MA, MS, MEng, MEd, MSW, MBA)  Occupational History   Occupation: retired---Financial Analyst  Tobacco Use   Smoking status: Former    Current packs/day: 0.00    Average packs/day: 2.0 packs/day for 26.0 years (52.0 ttl pk-yrs)    Types: Cigarettes    Start date: 07/29/1956    Quit date: 07/29/1982    Years since quitting: 41.3   Smokeless tobacco: Never   Tobacco comments:    ages [age], up to 2 ppd  Vaping Use   Vaping status: Never Used  Substance and Sexual Activity   Alcohol use: No    Comment:  quit 1997   Drug use: No   Sexual activity: Not on file  Other Topics  Concern   Not on file  Social History Narrative   Household: pt, wife, child   Social Drivers of Corporate investment banker Strain: Low Risk  (11/30/2023)   Received from [facility]   Overall Financial Resource Strain (CARDIA)    Difficulty of Paying Living Expenses: Not very hard  Food Insecurity: No Food Insecurity (11/30/2023)   Received from [facility]   Hunger Vital Sign    Worried About Running Out of Food in the Last Year: Never true    Ran Out of Food in the Last Year: Never true  Transportation Needs: No Transportation Needs (11/30/2023)   Received from [facility] - Transportation    Lack of Transportation (Medical): No    Lack of Transportation (Non-Medical): No  Physical Activity: Sufficiently Active (11/30/2023)   [facility]   Exercise Vital Sign    Days of Exercise per Week: 7 days    Minutes of Exercise per Session: 60 min  Stress: No Stress Concern Present (11/30/2023)   Received from [facility] - Occupational Stress Questionnaire    Feeling of Stress : Only a little  Social Connections: Moderately Integrated (11/30/2023)   Received from [facility]   Social Network    How would you rate your social network (family, work, friends)?: Adequate participation with social networks     Family History: The patient's family history includes Alcohol abuse in his mother; Diabetes in his father; Hypertension in his mother; Kidney disease in his father; Obesity in his mother; Stroke (age of onset: 62) in his mother. There is no history of Heart disease or Cancer.  ROS:   Please see the history of present illness.    All other systems reviewed and are negative.  EKGs/Labs/Other Studies Reviewed:    The following studies were reviewed today: I discussed my findings with the patient at length   Recent Labs: 08/11/2023: ALT 12; BUN 9; Creatinine, Ser 0.96; Hemoglobin 14.9; Platelets 234.0;  Potassium 4.1; Sodium 140  Recent Lipid Panel    Component Value Date/Time   CHOL 145 08/11/2023 0859   CHOL 130 08/26/2021 0853   TRIG 40.0 08/11/2023 0859   HDL 77.30 08/11/2023 0859   HDL 67 08/26/2021 0853   CHOLHDL 2 08/11/2023 0859   VLDL 8.0 08/11/2023 0859   LDLCALC 60 08/11/2023 0859   LDLCALC 54 08/26/2021 0853   LDLDIRECT 124.6 11/10/2008 0000    Physical Exam:    VS:  BP 118/62   Pulse (!) 56   Ht 5' 8.6" (1.742 m)   Wt 218 lb 1.3 oz (98.9 kg)   SpO2 96%   BMI 32.58 kg/m     Wt Readings from Last 3 Encounters:  12/04/23 218 lb 1.3 oz (98.9 kg)  08/11/23 210 lb (95.3 kg)  04/09/23 205 lb (93 kg)     GEN: Patient is in no acute distress HEENT: Normal NECK: No JVD; No carotid bruits LYMPHATICS: No lymphadenopathy CARDIAC: Hear sounds regular, 2/6 systolic murmur at the apex. RESPIRATORY:  Clear to auscultation without rales, wheezing or rhonchi  ABDOMEN: Soft, non-tender, non-distended MUSCULOSKELETAL:  No edema; No deformity  SKIN: Warm and dry NEUROLOGIC:  Alert and oriented x 3 PSYCHIATRIC:  Normal affect   Signed, Nelia Balzarine, MD  12/04/2023 11:36 AM    [facility]

## 2023-12-06 ENCOUNTER — Other Ambulatory Visit: Payer: Self-pay | Admitting: Internal Medicine

## 2023-12-16 ENCOUNTER — Ambulatory Visit (INDEPENDENT_AMBULATORY_CARE_PROVIDER_SITE_OTHER): Payer: Medicare Other | Admitting: Psychology

## 2023-12-16 DIAGNOSIS — F4322 Adjustment disorder with anxiety: Secondary | ICD-10-CM | POA: Diagnosis not present

## 2023-12-16 NOTE — Progress Notes (Signed)
 [facility] Counselor/Therapist Progress Note  Patient ID: Gregory Wilkins, MRN: [id number],    Date: 12/16/2023  Time Spent: 9:00am-9:50am  50 minutes   Treatment Type: Individual Therapy  Reported Symptoms: stress  Mental Status Exam: Appearance:  Casual     Behavior: Appropriate  Motor: Normal  Speech/Language:  Normal Rate  Affect: Appropriate  Mood: normal  Thought process: normal  Thought content:   WNL  Sensory/Perceptual disturbances:   WNL  Orientation: oriented to person, place, time/date, and situation  Attention: Good  Concentration: Good  Memory: WNL  Fund of knowledge:  Good  Insight:   Good  Judgment:  Good  Impulse Control: Good   Risk Assessment: Danger to Self:  No Self-injurious Behavior: No Danger to Others: No Duty to Warn:no Physical Aggression / Violence:No  Access to Firearms a concern: No  Gang Involvement:No   Subjective: Pt present for face-to-face individual therapy via video.  Pt consents to telehealth video session and is aware of limitations and benefits of virtual sessions.  Location of pt: home Location of therapist: home office.   Pt talked about his health.   He started the weight loss medication Zepbound last week and has already lost a couple of pounds.   Pt is working on reducing calories and eating 100 grams of protein a day.   He feels motivated to make positive changes. Pt states he has been doing well bc [name] is in [place] for 3 weeks.   Pt is enjoying the break from conflict.   His son is still home but there has not been conflict with him recently.    Pt is using the respite to recharge and take care of himself. Pt talked about politics and the state of the world.   He is discouraged by the events and is losing faith in politicians.   Helped pt process his thoughts and feelings.   Worked on self care strategies. Provided supportive therapy.  Interventions: Cognitive Behavioral Therapy and  Insight-Oriented  Diagnosis:  F43.22  Plan of Care: Pt participated in setting treatment goals.   He is progressing toward goals.  Pt  Plan to continue to meet every two weeks. Pt agrees with treatment plan.  Treatment Plan  (Treatment Plan Target Date 11/17/2024) Client Abilities/Strengths  Pt is bright, engaging and motivated for therapy.  Client Treatment Preferences  Individual therapy.  Client Statement of Needs  Improve coping skills.  Symptoms  Autonomic hyperactivity (e.g., palpitations, shortness of breath, dry mouth, trouble swallowing, nausea, diarrhea). Excessive and/or unrealistic worry that is difficult to control occurring more days than not for at least 6 months about a number of events or activities. Hypervigilance (e.g., feeling constantly on edge, experiencing concentration difficulties, having trouble falling or staying asleep, exhibiting a general state of irritability). Motor tension (e.g., restlessness, tiredness, shakiness, muscle tension). Problems Addressed  Anxiety Goals 1. Enhance ability to effectively cope with the full variety of life's worries and anxieties. 2. Learn and implement coping skills that result in a reduction of anxiety and worry, and improved daily functioning. Objective Learn to accept limitations in life and commit to tolerating, rather than avoiding, unpleasant emotions while accomplishing meaningful goals. Target Date: 2024-11-17 Frequency: Biweekly Progress: 65 Modality: individual Related Interventions 1. Use techniques from Acceptance and Commitment Therapy to help client accept uncomfortable realities such as lack of complete control, imperfections, and uncertainty and tolerate unpleasant emotions and thoughts in order to accomplish value-consistent goals. Objective Learn and implement  problem-solving strategies for realistically addressing worries. Target Date: 2024-11-17 Frequency: Biweekly Progress: 65 Modality: individual Related  Interventions 1. Assign the client a homework exercise in which he/she problem-solves a current problem.  review, reinforce success, and provide corrective feedback toward improvement. 2. Teach the client problem-solving strategies involving specifically defining a problem, generating options for addressing it, evaluating the pros and cons of each option, selecting and implementing an optional action, and reevaluating and refining the action. Objective Learn and implement calming skills to reduce overall anxiety and manage anxiety symptoms. Target Date: 2024-11-17 Frequency: Biweekly Progress: 65 Modality: individual Related Interventions 1. Assign the client to read about progressive muscle relaxation and other calming strategies in relevant books or treatment manuals (e.g., Progressive Relaxation Training by Rodolfo Clan and Arvil Birks; Mastery of Your Anxiety and Worry: Workbook by Rodney Clamp). 2. Assign the client homework each session in which he/she practices relaxation exercises daily, gradually applying them progressively from non-anxiety-provoking to anxiety-provoking situations; review and reinforce success while providing corrective feedback toward improvement. 3. Teach the client calming/relaxation skills (e.g., applied relaxation, progressive muscle relaxation, cue controlled relaxation; mindful breathing; biofeedback) and how to discriminate better between relaxation and tension; teach the client how to apply these skills to his/her daily life. 3. Reduce overall frequency, intensity, and duration of the anxiety so that daily functioning is not impaired. 4. Resolve the core conflict that is the source of anxiety. 5. Stabilize anxiety level while increasing ability to function on a daily basis. Diagnosis :    F43.22 Conditions For Discharge Achievement of treatment goals and objectives.   Kennith Morss, LCSW

## 2023-12-24 ENCOUNTER — Ambulatory Visit

## 2023-12-24 VITALS — BP 137/63 | HR 53 | Temp 97.9°F | Ht 68.0 in | Wt 206.0 lb

## 2023-12-24 DIAGNOSIS — Z0000 Encounter for general adult medical examination without abnormal findings: Secondary | ICD-10-CM

## 2023-12-24 NOTE — Progress Notes (Signed)
 Please attest this visit in the absence of patient primary care provider.     Subjective:   Gregory Wilkins is a 83 y.o. who presents for a Medicare Wellness preventive visit.  As a reminder, Annual Wellness Visits don't include a physical exam, and some assessments may be limited, especially if this visit is performed virtually. We may recommend an in-person follow-up visit with your provider if needed.  Visit Complete: In person  Persons Participating in Visit: Patient.  AWV Questionnaire: Yes: Patient Medicare AWV questionnaire was completed by the patient on 12/23/23; I have confirmed that all information answered by patient is correct and no changes since this date.  Cardiac Risk Factors include: advanced age (>84men, >58 women);hypertension;dyslipidemia;male gender;obesity (BMI >30kg/m2)     Objective:     Today's Vitals   12/24/23 1103  BP: 137/63  Pulse: (!) 53  Temp: 97.9 F (36.6 C)  TempSrc: Oral  SpO2: 99%  Weight: 206 lb (93.4 kg)  Height: 5\' 8"  (1.727 m)   Body mass index is 31.32 kg/m.     12/24/2023   11:29 AM 08/29/2022    1:04 PM 08/06/2021    1:52 PM 05/17/2020    7:52 PM 03/04/2020    9:16 PM 05/16/2019    4:41 PM 12/17/2018    6:48 PM  Advanced Directives  Does Patient Have a Medical Advance Directive? Yes Yes Yes No Yes Yes Yes  Type of Estate agent of [place];Living will Healthcare Power of [employer] Power of [employer] Power of [place];Living will Living will;Healthcare Power of Attorney  Does patient want to make changes to medical advance directive? No - Patient declined No - Patient declined       Copy of Healthcare Power of Attorney in Chart? No - copy requested No - copy requested No - copy requested      Would patient like information on creating a medical advance directive?    No - Patient declined     Pt has advanced directives but states he doesn't want us  to have them until he "has to  activate them".   Current Medications (verified) Outpatient Encounter Medications as of 12/24/2023  Medication Sig   aspirin  EC 81 MG tablet Take 1 tablet (81 mg total) by mouth daily. Swallow whole.   atorvastatin  (LIPITOR) 10 MG tablet Take 1 tablet (10 mg total) by mouth at bedtime.   b complex vitamins capsule Take 1 capsule by mouth daily.   cetirizine (ZYRTEC) 10 MG tablet Take 10 mg by mouth daily.   cholecalciferol (VITAMIN D3) 25 MCG (1000 UNIT) tablet Take 1,000 Units by mouth daily.   clonazePAM  (KLONOPIN ) 0.5 MG tablet TAKE 1 TABLET BY MOUTH EVERY NIGHT AT BEDTIME AS NEEDED FOR RESTLESS LEG   ELIQUIS  5 MG TABS tablet TAKE 1 TABLET BY MOUTH TWICE A DAY   hydrochlorothiazide  (HYDRODIURIL ) 12.5 MG tablet Take 1 tablet (12.5 mg total) by mouth daily.   losartan  (COZAAR ) 25 MG tablet TAKE 1 TABLET (25 MG TOTAL) BY MOUTH DAILY.   metoprolol  tartrate (LOPRESSOR ) 50 MG tablet Take 12.5 mg by mouth as needed for heart rate control (fast heart rate).   Multiple Vitamin (MULTIVITAMIN WITH MINERALS) TABS tablet Take 1 tablet by mouth daily.   Multiple Vitamins-Minerals (ICAPS AREDS 2 PO) Take 1 tablet by mouth daily in the afternoon.   tirzepatide (ZEPBOUND) 2.5 MG/0.5ML Pen Inject 2.5 mg into the skin once a week. Prescribed by [facility]  No facility-administered encounter medications on file as of 12/24/2023.    Allergies (verified) Penicillins and Ramipril    History: Past Medical History:  Diagnosis Date   Acne rosacea 12/23/2006   Qualifier: Diagnosis of  By: Donnice Gale MD, Sammie Crigler   Dr Wolm Hawthorne   Formatting of this note might be different from the original. Overview:  Qualifier: Diagnosis of  By: Donnice Gale MD, Sammie Crigler   Dr Wolm Hawthorne Overview:  Overview:  Qualifier: Diagnosis of  By: Donnice Gale MD, Sammie Crigler   Dr Wolm Hawthorne  Overview:  Qualifier: Diagnosis of  By: Donnice Gale MD, Sammie Crigler   Dr Wolm Hawthorne Overview:  Overview:  Overview:   Acute meniscal tear, lateral 10/01/2021   Alcohol abuse     Allergic rhinitis 12/23/2006   Qualifier: Diagnosis of  By: Donnice Gale MD, Sammie Crigler   Onset:in high school Character: perennial but increased seasonally Triggers :cat, dust, pollen Allergy testing: Dr Landrum Pink Maintenance medications/ response:Flonase/ Nasonex; saline wash; Allegra Smoking history:age 88- 42, up to 2 ppd Family history pulmonary disease: no    Formatting of this note might be different from the original. 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 Overview:  Qualifier: Diagnosis of  By: Donnice Gale MD, Fayetta Hoose of this note might be different from the original. Overview:  Qualifier: Diagnosis of  By: Donnice Gale MD, William  IMO 10/01 Updates   Enlarged prostate without lower urinary tract symptoms (luts) 11/09/2008   Formatting of this note might be different from the original. Overview:  Qualifier: Diagnosis of  By: Donnice Gale MD, Sammie Crigler   No PMH of elevated PSA ; no biopsy Overview:  Overview:  Qualifier: Diagnosis of  By: Donnice Gale MD, Sammie Crigler   No PMH of elevated PSA ; no biopsy Overview:  Overview:  Overview:  Qualifier: Diagnosis of  By: Donnice Gale MD, Sammie Crigler   No PMH of elevated PSA ; no biopsy   Essential (primary) hypertension 09/21/2007   Qualifier: Diagnosis of  By: Donnice Gale MD, Fayetta Hoose of this note might be different from the original. Overview:  Qualifier: Diagnosis of  By: Donnice Gale MD,  Sammie Crigler   Last Assessment & Plan:  Blood pressure appears to be well controlled. Renal function will be checked.  He has a minor cough; this does not appear to be significant enough to warrant change to losartan  Overview:  Overview:  Qu   Family history of type C viral hepatitis 06/30/2012   Brother had liver transplant    Hepatitis A 04/2019   History of colonic polyps 12/23/2006   Qualifier: Diagnosis of  By: Donnice Gale MD, Sammie Crigler   Dr Connie Deist, GI Polypectomy X2 , 4/16 /2014. One was  transitional cell adenoma Due annually No FH colon cancer   Formatting of this note might be different from the original. Overview:  Qualifier: Diagnosis of  By: Donnice Gale MD, Sammie Crigler  Dr Connie Deist, GI Polypectomy X2 , 4/16 /2014. One was  transitional cell adenoma Due annually No FH colon ca   History of urinary stone 09/21/2007   Qualifier: Diagnosis of  By: Donnice Gale MD, Claudene Crystal , passed spontaneously No Urologist involvement   Formatting of this note might be different from the original. Overview:  Qualifier: Diagnosis of  By: Donnice Gale MD, Claudene Crystal , passed spontaneously No Urologist involvement Overview:  Overview:  Qualifier: Diagnosis of  By: Donnice Gale MD, Claudene Crystal , passed spontaneously No Urologist involvement    HYPERLIPIDEMIA 09/21/2007   Qualifier: Diagnosis of  By: Donnice Gale MD, [place] Lipoprofile 2006 : LDL  108 ( 1072  / 575  ),  HDL 63 , Triglycerides  40. LDL goal = < 135 ; ideally < 105. No FH CAD   Formatting of this note might be different from the original. Overview:  Qualifier: Diagnosis of  By: Donnice Gale MD, [place] Lipoprofile 2006 : LDL  108 ( 1072  / 575  ),  HDL 63 , Triglycerides  40. LDL goal = < 135 ; idea   Hypertension    Joint pain    Liver injury, initial encounter 05/17/2019   Macular degeneration 01/06/2018   Malignant neoplasm of skin    Mallet finger 2012     4th L DIP,Dr Sypher   Nephrolithiasis    Nocturnal hypoxia 11/04/2019   Nuclear sclerotic cataract of both  eyes    Obesity (BMI 30-39.9) 01/04/2014   OSA on CPAP 05/17/2020   Osteoarthritis    PCP NOTES >>>>>>>>>>>>>>>> 05/26/2019   Personal history of other malignant neoplasm of skin 06/30/2012   Dr Marcia Setters, Derm X3 Local resection from  right forearm and forehead.   Mohs surgery of the nose 06/2012, Dr Elfredia Grippe of this note might be different from the original. Overview:  Dr Marcia Setters, Derm X3 Local resection from  right forearm and forehead.   Mohs surgery of the nose 06/2012, Dr Jerilynn Montenegro Overview:  Overview:  Dr Marcia Setters, Derm X3 Local resection from  right forearm and fo   RLS (restless legs syndrome)    Sleep apnea    Past Surgical History:  Procedure Laterality Date   basal cell cancer     X 3   EYE SURGERY     Cataract   INGUINAL HERNIA REPAIR  [age]   right,left   OPEN REDUCTION INTERNAL FIXATION (ORIF) METACARPAL Right 05/19/2013   Procedure: OPEN REDUCTION INTERNAL FIXATION  RIGHT SMALL  METACARPAL FRACTURE;  Surgeon: Amelie Baize., MD;  Location: [facility];  Service: Orthopedics;  Laterality: Right;   POLYPECTOMY     X 3; Dr Andriette Keeling   WISDOM TOOTH EXTRACTION     Family History  Problem Relation Age of Onset   Stroke Mother 61   Hypertension Mother    Alcohol abuse Mother    Obesity Mother    Diabetes Father    Kidney disease Father    Heart disease Neg Hx    Cancer Neg Hx    Social History   Socioeconomic History   Marital status: Married    Spouse name: Abe Abed    Number of children: Not on file   Years of education: Not on file   Highest education level: Master's degree (e.g., MA, MS, MEng, MEd, MSW, MBA)  Occupational History   Occupation: retired---Financial Systems developer  Tobacco Use  Smoking status: Former    Current packs/day: 0.00    Average packs/day: 2.0 packs/day for 26.0 years (52.0 ttl pk-yrs)    Types: Cigarettes    Start date: 07/29/1956    Quit date: 07/29/1982    Years since quitting: 41.4   Smokeless tobacco: Never    Tobacco comments:    ages [age], up to 2 ppd  Vaping Use   Vaping status: Never Used  Substance and Sexual Activity   Alcohol use: No    Comment: quit 1997   Drug use: No   Sexual activity: Not on file  Other Topics Concern   Not on file  Social History Narrative   Household: pt, wife, child   Social Drivers of Corporate investment banker Strain: Low Risk  (12/24/2023)   Overall Financial Resource Strain (CARDIA)    Difficulty of Paying Living Expenses: Not very hard  Food Insecurity: No Food Insecurity (12/24/2023)   Hunger Vital Sign    Worried About Running Out of Food in the Last Year: Never true    Ran Out of Food in the Last Year: Never true  Transportation Needs: No Transportation Needs (12/24/2023)   PRAPARE - Administrator, Civil Service (Medical): No    Lack of Transportation (Non-Medical): No  Physical Activity: Sufficiently Active (12/24/2023)   Exercise Vital Sign    Days of Exercise per Week: 7 days    Minutes of Exercise per Session: 60 min  Stress: No Stress Concern Present (12/24/2023)   [employer] of Occupational Health - Occupational Stress Questionnaire    Feeling of Stress : Not at all  Social Connections: Moderately Isolated (12/24/2023)   Social Connection and Isolation Panel [NHANES]    Frequency of Communication with Friends and Family: Never    Frequency of Social Gatherings with Friends and Family: Never    Attends Religious Services: Never    Database administrator or Organizations: Yes    Attends Engineer, structural: More than 4 times per year    Marital Status: Married    Tobacco Counseling Counseling given: Not Answered Tobacco comments: ages 75-40, up to 2 ppd  Clinical Intake:  Pre-visit preparation completed: Yes  Pain : No/denies pain     BMI - recorded: 31.32 Nutritional Status: BMI > 30  Obese Nutritional Risks: None Diabetes: No  Lab Results  Component Value Date   HGBA1C 5.5 02/25/2022    HGBA1C 5.3 09/17/2020   HGBA1C 5.3 11/10/2008     How often do you need to have someone help you when you read instructions, pamphlets, or other written materials from your doctor or pharmacy?: 1 - Never What is the last grade level you completed in school?: master's degree  Interpreter Needed?: No Information entered by :: Susa Engman, CMA  Activities of Daily Living     12/24/2023   12:09 PM 12/23/2023    8:14 AM  In your present state of health, do you have any difficulty performing the following activities:  Hearing? 0 0  Vision? 0 0  Difficulty concentrating or making decisions? 0 0  Walking or climbing stairs? 0 0  Dressing or bathing? 0 0  Doing errands, shopping? 0 0  Preparing Food and eating ? N N  Using the Toilet? N N  In the past six months, have you accidently leaked urine? N N  Do you have problems with loss of bowel control? N N  Managing your Medications? N N  Managing  your Finances? N N  Housekeeping or managing your Housekeeping? N N    Patient Care Team: Ezell Hollow, MD as PCP - General (Internal Medicine)  Indicate any recent Medical Services you may have received from other than [facility] providers in the past year (date may be approximate).     Assessment:    This is a routine wellness examination for [place].  Hearing/Vision screen No results found.   Goals Addressed             This Visit's Progress    Patient Stated   On track    Lose weight        Depression Screen     12/24/2023   11:24 AM 08/11/2023    8:16 AM 03/06/2023    8:11 AM 10/07/2022    8:02 AM 08/29/2022    1:09 PM 02/25/2022    8:00 AM 08/06/2021    1:50 PM  PHQ 2/9 Scores  PHQ - 2 Score 0 0 0 0 0 0 0  PHQ- 9 Score   0        Fall Risk     12/23/2023    8:14 AM 08/11/2023    8:16 AM 03/06/2023    8:11 AM 10/07/2022    8:02 AM 08/28/2022    2:20 PM  Fall Risk   Falls in the past year? 0 0 0 0 0  Number falls in past yr: 0 0 0 0 0  Injury with Fall? 0 0 0 0 0  Risk for  fall due to : No Fall Risks  No Fall Risks  No Fall Risks  Follow up Falls evaluation completed Falls evaluation completed;Education provided Falls evaluation completed Falls evaluation completed Falls evaluation completed  Pt reports no changes since 12/23/23.  MEDICARE RISK AT HOME:  Medicare Risk at Home Any stairs in or around the home?: (Patient-Rptd) Yes If so, are there any without handrails?: (Patient-Rptd) No Home free of loose throw rugs in walkways, pet beds, electrical cords, etc?: Yes Adequate lighting in your home to reduce risk of falls?: (Patient-Rptd) Yes Life alert?: (Patient-Rptd) No Use of a cane, walker or w/c?: (Patient-Rptd) No Grab bars in the bathroom?: (Patient-Rptd) No Shower chair or bench in shower?: (Patient-Rptd) No Elevated toilet seat or a handicapped toilet?: (Patient-Rptd) No  TIMED UP AND GO:  Was the test performed?  Yes  Length of time to ambulate 10 feet: 5 sec Gait steady and fast without use of assistive device  Cognitive Function: 6CIT completed        12/24/2023   11:35 AM 08/29/2022    1:12 PM 08/06/2021    1:58 PM  6CIT Screen  What Year? 0 points 0 points 0 points  What month? 0 points 0 points 0 points  What time? 0 points 0 points 0 points  Count back from 20 0 points 0 points 0 points  Months in reverse 0 points 0 points 0 points  Repeat phrase 0 points 0 points 0 points  Total Score 0 points 0 points 0 points    Immunizations Immunization History  Administered Date(s) Administered   Fluad Quad(high Dose 65+) 05/15/2021, 05/06/2022   Influenza Split 05/28/2013   Influenza, High Dose Seasonal PF 06/02/2014, 04/08/2018, 03/07/2019, 04/17/2020, 04/01/2023   Influenza, Seasonal, Injecte, Preservative Fre 06/30/2012   Influenza,inj,Quad PF,6+ Mos 04/19/2015   Moderna Covid-19 Fall Seasonal Vaccine 28yrs & older 04/01/2023   PFIZER Comirnaty(Gray Top)Covid-19 Tri-Sucrose Vaccine 11/21/2020, 05/06/2022   PFIZER(Purple  Top)SARS-COV-2  Vaccination 08/10/2019, 08/30/2019, 04/24/2020   Pfizer Covid-19 Vaccine Bivalent Booster 40yrs & up 07/05/2021, 01/30/2022   Pneumococcal Conjugate-13 04/29/2016   Pneumococcal Polysaccharide-23 05/09/2010, 08/28/2020   Respiratory Syncytial Virus Vaccine ,Recomb Aduvanted(Arexvy ) 03/06/2023   Td 12/26/2005, 01/09/2011   Tdap 02/26/2021   Zoster Recombinant(Shingrix) 08/26/2018, 03/07/2019   Zoster, Live 11/09/2008    Screening Tests Health Maintenance  Topic Date Due   COVID-19 Vaccine (9 - Pfizer risk 2024-25 season) 02/07/2024 (Originally 09/29/2023)   INFLUENZA VACCINE  02/26/2024   Medicare Annual Wellness (AWV)  12/23/2024   DTaP/Tdap/Td (4 - Td or Tdap) 02/27/2031   Pneumonia Vaccine 10+ Years old  Completed   Zoster Vaccines- Shingrix  Completed   HPV VACCINES  Aged Out   Meningococcal B Vaccine  Aged Out   Colonoscopy  Discontinued   Hepatitis C Screening  Discontinued    Health Maintenance  There are no preventive care reminders to display for this patient.  Health Maintenance Items Addressed: All health maintenance up to date.    Additional Screening:  Vision Screening: Recommended annual ophthalmology exams for early detection of glaucoma and other disorders of the eye.  Dental Screening: Recommended annual dental exams for proper oral hygiene  Community Resource Referral / Chronic Care Management: CRR required this visit?  No   CCM required this visit?  No   Plan:    I have personally reviewed and noted the following in the patient's chart:   Medical and social history Use of alcohol, tobacco or illicit drugs  Current medications and supplements including opioid prescriptions. Patient is not currently taking opioid prescriptions. Functional ability and status Nutritional status Physical activity Advanced directives List of other physicians Hospitalizations, surgeries, and ER visits in previous 12 months Vitals Screenings to include  cognitive, depression, and falls Referrals and appointments  In addition, I have reviewed and discussed with patient certain preventive protocols, quality metrics, and best practice recommendations. A written personalized care plan for preventive services as well as general preventive health recommendations were provided to patient.   Susa Engman, CMA   12/24/2023   After Visit Summary: (In Person-Printed) AVS printed and given to the patient  Notes: Nothing significant to report at this time.

## 2023-12-24 NOTE — Patient Instructions (Addendum)
  Gregory Wilkins , Thank you for taking time to come for your Medicare Wellness Visit. I appreciate your ongoing commitment to your health goals. Please review the following plan we discussed and let me know if I can assist you in the future.   These are the goals we discussed:  Goals      Patient Stated     Lose weight         This is a list of the screening recommended for you and due dates:  Health Maintenance  Topic Date Due   Medicare Annual Wellness Visit  08/30/2023   COVID-19 Vaccine (9 - Pfizer risk 2024-25 season) 02/07/2024*   Flu Shot  02/26/2024   DTaP/Tdap/Td vaccine (4 - Td or Tdap) 02/27/2031   Pneumonia Vaccine  Completed   Zoster (Shingles) Vaccine  Completed   HPV Vaccine  Aged Out   Meningitis B Vaccine  Aged Out   Colon Cancer Screening  Discontinued   Hepatitis C Screening  Discontinued  *Topic was postponed. The date shown is not the original due date.    Please look over the MOST form given to you today and consider adding this to your Advanced Directives.  I look forward to talking to you on the phone next year at 12/29/24 ar 8:20.

## 2023-12-30 ENCOUNTER — Ambulatory Visit (INDEPENDENT_AMBULATORY_CARE_PROVIDER_SITE_OTHER): Payer: Medicare Other | Admitting: Psychology

## 2023-12-30 DIAGNOSIS — F4322 Adjustment disorder with anxiety: Secondary | ICD-10-CM | POA: Diagnosis not present

## 2023-12-30 NOTE — Progress Notes (Signed)
 [facility] Counselor/Therapist Progress Note  Patient ID: AMORY SIMONETTI, MRN: [id number],    Date: 12/30/2023  Time Spent: 9:00am-9:50am  50 minutes   Treatment Type: Individual Therapy  Reported Symptoms: stress  Mental Status Exam: Appearance:  Casual     Behavior: Appropriate  Motor: Normal  Speech/Language:  Normal Rate  Affect: Appropriate  Mood: normal  Thought process: normal  Thought content:   WNL  Sensory/Perceptual disturbances:   WNL  Orientation: oriented to person, place, time/date, and situation  Attention: Good  Concentration: Good  Memory: WNL  Fund of knowledge:  Good  Insight:   Good  Judgment:  Good  Impulse Control: Good   Risk Assessment: Danger to Self:  No Self-injurious Behavior: No Danger to Others: No Duty to Warn:no Physical Aggression / Violence:No  Access to Firearms a concern: No  Gang Involvement:No   Subjective: Pt present for face-to-face individual therapy via video.  Pt consents to telehealth video session and is aware of limitations and benefits of virtual sessions.  Location of pt: home Location of therapist: home office.   Pt talked about his family.   Abe Abed is back from [place].   Pt's son Devian has been hyper and extra difficult to deal with.  Pt and wife have had arguments about how to deal with [employer].  Addressed their interactions and how they impacted pt.  Helped pt process his feelings and family dynamics. Worked with pt on giving himself grace.  Pt talked about his health.   He is taking the weight loss medication Zepbound and has lost 13 pounds.   Pt is working on reducing calories and eating 100 grams of protein a day.   He feels motivated to make positive changes. Pt talked about politics and the state of the world.   Helped pt process his thoughts and feelings.   Worked on self care strategies. Provided supportive therapy.  Interventions: Cognitive Behavioral Therapy and Insight-Oriented  Diagnosis:   F43.22  Plan of Care: Pt participated in setting treatment goals.   He is progressing toward goals.  Pt  Plan to continue to meet every two weeks. Pt agrees with treatment plan.  Treatment Plan  (Treatment Plan Target Date 11/17/2024) Client Abilities/Strengths  Pt is bright, engaging and motivated for therapy.  Client Treatment Preferences  Individual therapy.  Client Statement of Needs  Improve coping skills.  Symptoms  Autonomic hyperactivity (e.g., palpitations, shortness of breath, dry mouth, trouble swallowing, nausea, diarrhea). Excessive and/or unrealistic worry that is difficult to control occurring more days than not for at least 6 months about a number of events or activities. Hypervigilance (e.g., feeling constantly on edge, experiencing concentration difficulties, having trouble falling or staying asleep, exhibiting a general state of irritability). Motor tension (e.g., restlessness, tiredness, shakiness, muscle tension). Problems Addressed  Anxiety Goals 1. Enhance ability to effectively cope with the full variety of life's worries and anxieties. 2. Learn and implement coping skills that result in a reduction of anxiety and worry, and improved daily functioning. Objective Learn to accept limitations in life and commit to tolerating, rather than avoiding, unpleasant emotions while accomplishing meaningful goals. Target Date: 2024-11-17 Frequency: Biweekly Progress: 65 Modality: individual Related Interventions 1. Use techniques from Acceptance and Commitment Therapy to help client accept uncomfortable realities such as lack of complete control, imperfections, and uncertainty and tolerate unpleasant emotions and thoughts in order to accomplish value-consistent goals. Objective Learn and implement problem-solving strategies for realistically addressing worries. Target Date: 2024-11-17 Frequency:  Biweekly Progress: 65 Modality: individual Related Interventions 1. Assign the  client a homework exercise in which he/she problem-solves a current problem.  review, reinforce success, and provide corrective feedback toward improvement. 2. Teach the client problem-solving strategies involving specifically defining a problem, generating options for addressing it, evaluating the pros and cons of each option, selecting and implementing an optional action, and reevaluating and refining the action. Objective Learn and implement calming skills to reduce overall anxiety and manage anxiety symptoms. Target Date: 2024-11-17 Frequency: Biweekly Progress: 65 Modality: individual Related Interventions 1. Assign the client to read about progressive muscle relaxation and other calming strategies in relevant books or treatment manuals (e.g., Progressive Relaxation Training by Rodolfo Clan and Arvil Birks; Mastery of Your Anxiety and Worry: Workbook by Rodney Clamp). 2. Assign the client homework each session in which he/she practices relaxation exercises daily, gradually applying them progressively from non-anxiety-provoking to anxiety-provoking situations; review and reinforce success while providing corrective feedback toward improvement. 3. Teach the client calming/relaxation skills (e.g., applied relaxation, progressive muscle relaxation, cue controlled relaxation; mindful breathing; biofeedback) and how to discriminate better between relaxation and tension; teach the client how to apply these skills to his/her daily life. 3. Reduce overall frequency, intensity, and duration of the anxiety so that daily functioning is not impaired. 4. Resolve the core conflict that is the source of anxiety. 5. Stabilize anxiety level while increasing ability to function on a daily basis. Diagnosis :    F43.22 Conditions For Discharge Achievement of treatment goals and objectives.   Arlyne Brandes, LCSW

## 2023-12-31 ENCOUNTER — Other Ambulatory Visit: Payer: Self-pay | Admitting: Cardiology

## 2024-01-05 ENCOUNTER — Ambulatory Visit (HOSPITAL_BASED_OUTPATIENT_CLINIC_OR_DEPARTMENT_OTHER): Admitting: Primary Care

## 2024-01-13 ENCOUNTER — Ambulatory Visit (INDEPENDENT_AMBULATORY_CARE_PROVIDER_SITE_OTHER): Payer: Medicare Other | Admitting: Psychology

## 2024-01-13 DIAGNOSIS — F4322 Adjustment disorder with anxiety: Secondary | ICD-10-CM | POA: Diagnosis not present

## 2024-01-13 NOTE — Progress Notes (Signed)
 [facility] Counselor/Therapist Progress Note  Patient ID: COVE HAYDON, MRN: [id number],    Date: 01/13/2024  Time Spent: 9:00am-9:50am  50 minutes   Treatment Type: Individual Therapy  Reported Symptoms: stress  Mental Status Exam: Appearance:  Casual     Behavior: Appropriate  Motor: Normal  Speech/Language:  Normal Rate  Affect: Appropriate  Mood: normal  Thought process: normal  Thought content:   WNL  Sensory/Perceptual disturbances:   WNL  Orientation: oriented to person, place, time/date, and situation  Attention: Good  Concentration: Good  Memory: WNL  Fund of knowledge:  Good  Insight:   Good  Judgment:  Good  Impulse Control: Good   Risk Assessment: Danger to Self:  No Self-injurious Behavior: No Danger to Others: No Duty to Warn:no Physical Aggression / Violence:No  Access to Firearms a concern: No  Gang Involvement:No   Subjective: Pt present for face-to-face individual therapy via video.  Pt consents to telehealth video session and is aware of limitations and benefits of virtual sessions.  Location of pt: home Location of therapist: home office.   Pt states he is exhausted dealing with things at home.  His wife Abe Abed is stressed worrying about her sister and pt's son.   Abe Abed also has health issues she is dealing with.   When Abe Abed is stressed it impacts pt bc she gets more demanding.   Pt has gotten more frustrated and he has reacted in ways he regrets.  Helped pt process his feelings and family dynamics. Worked with pt on giving himself grace.  Pt is also down about aging and feeling like he is not what he use to be.   Addressed pts thoughts and feelings.   Worked on Pharmacologist.   Pt talked about his health.   He is taking the weight loss medication Zepbound and is continuing to lose weight.   Pt is working on reducing calories and eating 100 grams of protein a day.   He feels motivated to make positive changes. Pt talked about  politics and the state of the world.   Helped pt process his thoughts and feelings.   Worked on self care strategies. Provided supportive therapy.  Interventions: Cognitive Behavioral Therapy and Insight-Oriented  Diagnosis:  F43.22  Plan of Care: Pt participated in setting treatment goals.   He is progressing toward goals.  Pt  Plan to continue to meet every two weeks. Pt agrees with treatment plan.  Treatment Plan  (Treatment Plan Target Date 11/17/2024) Client Abilities/Strengths  Pt is bright, engaging and motivated for therapy.  Client Treatment Preferences  Individual therapy.  Client Statement of Needs  Improve coping skills.  Symptoms  Autonomic hyperactivity (e.g., palpitations, shortness of breath, dry mouth, trouble swallowing, nausea, diarrhea). Excessive and/or unrealistic worry that is difficult to control occurring more days than not for at least 6 months about a number of events or activities. Hypervigilance (e.g., feeling constantly on edge, experiencing concentration difficulties, having trouble falling or staying asleep, exhibiting a general state of irritability). Motor tension (e.g., restlessness, tiredness, shakiness, muscle tension). Problems Addressed  Anxiety Goals 1. Enhance ability to effectively cope with the full variety of life's worries and anxieties. 2. Learn and implement coping skills that result in a reduction of anxiety and worry, and improved daily functioning. Objective Learn to accept limitations in life and commit to tolerating, rather than avoiding, unpleasant emotions while accomplishing meaningful goals. Target Date: 2024-11-17 Frequency: Biweekly Progress: 65 Modality: individual Related Interventions 1.  Use techniques from Acceptance and Commitment Therapy to help client accept uncomfortable realities such as lack of complete control, imperfections, and uncertainty and tolerate unpleasant emotions and thoughts in order to accomplish  value-consistent goals. Objective Learn and implement problem-solving strategies for realistically addressing worries. Target Date: 2024-11-17 Frequency: Biweekly Progress: 65 Modality: individual Related Interventions 1. Assign the client a homework exercise in which he/she problem-solves a current problem.  review, reinforce success, and provide corrective feedback toward improvement. 2. Teach the client problem-solving strategies involving specifically defining a problem, generating options for addressing it, evaluating the pros and cons of each option, selecting and implementing an optional action, and reevaluating and refining the action. Objective Learn and implement calming skills to reduce overall anxiety and manage anxiety symptoms. Target Date: 2024-11-17 Frequency: Biweekly Progress: 65 Modality: individual Related Interventions 1. Assign the client to read about progressive muscle relaxation and other calming strategies in relevant books or treatment manuals (e.g., Progressive Relaxation Training by Rodolfo Clan and Arvil Birks; Mastery of Your Anxiety and Worry: Workbook by Rodney Clamp). 2. Assign the client homework each session in which he/she practices relaxation exercises daily, gradually applying them progressively from non-anxiety-provoking to anxiety-provoking situations; review and reinforce success while providing corrective feedback toward improvement. 3. Teach the client calming/relaxation skills (e.g., applied relaxation, progressive muscle relaxation, cue controlled relaxation; mindful breathing; biofeedback) and how to discriminate better between relaxation and tension; teach the client how to apply these skills to his/her daily life. 3. Reduce overall frequency, intensity, and duration of the anxiety so that daily functioning is not impaired. 4. Resolve the core conflict that is the source of anxiety. 5. Stabilize anxiety level while increasing ability to function on a  daily basis. Diagnosis :    F43.22 Conditions For Discharge Achievement of treatment goals and objectives.   Barbarann Kelly, LCSW

## 2024-01-27 ENCOUNTER — Ambulatory Visit (INDEPENDENT_AMBULATORY_CARE_PROVIDER_SITE_OTHER): Payer: Medicare Other | Admitting: Psychology

## 2024-01-27 DIAGNOSIS — F4322 Adjustment disorder with anxiety: Secondary | ICD-10-CM | POA: Diagnosis not present

## 2024-01-27 NOTE — Progress Notes (Signed)
 [facility] Counselor/Therapist Progress Note  Patient ID: Gregory Wilkins, MRN: [id number],    Date: 01/27/2024  Time Spent: 9:00am-9:50am  50 minutes   Treatment Type: Individual Therapy  Reported Symptoms: stress  Mental Status Exam: Appearance:  Casual     Behavior: Appropriate  Motor: Normal  Speech/Language:  Normal Rate  Affect: Appropriate  Mood: normal  Thought process: normal  Thought content:   WNL  Sensory/Perceptual disturbances:   WNL  Orientation: oriented to person, place, time/date, and situation  Attention: Good  Concentration: Good  Memory: WNL  Fund of knowledge:  Good  Insight:   Good  Judgment:  Good  Impulse Control: Good   Risk Assessment: Danger to Self:  No Self-injurious Behavior: No Danger to Others: No Duty to Warn:no Physical Aggression / Violence:No  Access to Firearms a concern: No  Gang Involvement:No   Subjective: Pt present for face-to-face individual therapy via video.  Pt consents to telehealth video session and is aware of limitations and benefits of virtual sessions.  Location of pt: home Location of therapist: home office.   Pt talked about his family.  He states Gregory Wilkins tells him what to do a lot and that gets very frustrating.  Helped pt process his feelings and family dynamics.  Pt talked about the past when he did not treat people well.   He states he used people and had a bad temper.   He feels regret and shame about that.   Helped pt process his feelings.  Worked with pt on giving himself grace and forgiveness.    Pt talked about his health.   He is continuing to take the weight loss medication Zepbound and is continuing to lose weight.   Pt is working on reducing calories and eating 100 grams of protein a day.   He feels motivated to make positive changes. Pt states [name] is getting a heart ablation tomorrow.   She is anxious about it but does not confide in pt.    Pt states they both tend to not ask  for help.   Pt talked about politics and the state of the world.   Pt is worried about the economy.  Helped pt process his thoughts and feelings.   Worked on self care strategies. Provided supportive therapy.  Interventions: Cognitive Behavioral Therapy and Insight-Oriented  Diagnosis:  F43.22  Plan of Care: Pt participated in setting treatment goals.   He is progressing toward goals.  Pt  Plan to continue to meet every two weeks. Pt agrees with treatment plan.  Treatment Plan  (Treatment Plan Target Date 11/17/2024) Client Abilities/Strengths  Pt is bright, engaging and motivated for therapy.  Client Treatment Preferences  Individual therapy.  Client Statement of Needs  Improve coping skills.  Symptoms  Autonomic hyperactivity (e.g., palpitations, shortness of breath, dry mouth, trouble swallowing, nausea, diarrhea). Excessive and/or unrealistic worry that is difficult to control occurring more days than not for at least 6 months about a number of events or activities. Hypervigilance (e.g., feeling constantly on edge, experiencing concentration difficulties, having trouble falling or staying asleep, exhibiting a general state of irritability). Motor tension (e.g., restlessness, tiredness, shakiness, muscle tension). Problems Addressed  Anxiety Goals 1. Enhance ability to effectively cope with the full variety of life's worries and anxieties. 2. Learn and implement coping skills that result in a reduction of anxiety and worry, and improved daily functioning. Objective Learn to accept limitations in life and commit to tolerating, rather  than avoiding, unpleasant emotions while accomplishing meaningful goals. Target Date: 2024-11-17 Frequency: Biweekly Progress: 65 Modality: individual Related Interventions 1. Use techniques from Acceptance and Commitment Therapy to help client accept uncomfortable realities such as lack of complete control, imperfections, and uncertainty and tolerate  unpleasant emotions and thoughts in order to accomplish value-consistent goals. Objective Learn and implement problem-solving strategies for realistically addressing worries. Target Date: 2024-11-17 Frequency: Biweekly Progress: 65 Modality: individual Related Interventions 1. Assign the client a homework exercise in which he/she problem-solves a current problem.  review, reinforce success, and provide corrective feedback toward improvement. 2. Teach the client problem-solving strategies involving specifically defining a problem, generating options for addressing it, evaluating the pros and cons of each option, selecting and implementing an optional action, and reevaluating and refining the action. Objective Learn and implement calming skills to reduce overall anxiety and manage anxiety symptoms. Target Date: 2024-11-17 Frequency: Biweekly Progress: 65 Modality: individual Related Interventions 1. Assign the client to read about progressive muscle relaxation and other calming strategies in relevant books or treatment manuals (e.g., Progressive Relaxation Training by Thornell and Elmer; Mastery of Your Anxiety and Worry: Workbook by Richarda armin Given). 2. Assign the client homework each session in which he/she practices relaxation exercises daily, gradually applying them progressively from non-anxiety-provoking to anxiety-provoking situations; review and reinforce success while providing corrective feedback toward improvement. 3. Teach the client calming/relaxation skills (e.g., applied relaxation, progressive muscle relaxation, cue controlled relaxation; mindful breathing; biofeedback) and how to discriminate better between relaxation and tension; teach the client how to apply these skills to his/her daily life. 3. Reduce overall frequency, intensity, and duration of the anxiety so that daily functioning is not impaired. 4. Resolve the core conflict that is the source of anxiety. 5. Stabilize  anxiety level while increasing ability to function on a daily basis. Diagnosis :    F43.22 Conditions For Discharge Achievement of treatment goals and objectives.   Gregory Mundorf, LCSW

## 2024-02-12 ENCOUNTER — Ambulatory Visit: Payer: No Typology Code available for payment source

## 2024-02-12 ENCOUNTER — Ambulatory Visit (INDEPENDENT_AMBULATORY_CARE_PROVIDER_SITE_OTHER): Payer: No Typology Code available for payment source | Admitting: Internal Medicine

## 2024-02-12 ENCOUNTER — Encounter: Payer: Self-pay | Admitting: Internal Medicine

## 2024-02-12 VITALS — BP 126/72 | HR 58 | Temp 98.2°F | Resp 16 | Ht 68.5 in | Wt 189.5 lb

## 2024-02-12 DIAGNOSIS — R739 Hyperglycemia, unspecified: Secondary | ICD-10-CM | POA: Diagnosis not present

## 2024-02-12 DIAGNOSIS — E782 Mixed hyperlipidemia: Secondary | ICD-10-CM | POA: Diagnosis not present

## 2024-02-12 DIAGNOSIS — I10 Essential (primary) hypertension: Secondary | ICD-10-CM

## 2024-02-12 DIAGNOSIS — I4892 Unspecified atrial flutter: Secondary | ICD-10-CM | POA: Diagnosis not present

## 2024-02-12 LAB — CBC WITH DIFFERENTIAL/PLATELET
Basophils Absolute: 0 K/uL (ref 0.0–0.1)
Basophils Relative: 0.7 % (ref 0.0–3.0)
Eosinophils Absolute: 0.4 K/uL (ref 0.0–0.7)
Eosinophils Relative: 8.4 % — ABNORMAL HIGH (ref 0.0–5.0)
HCT: 43.3 % (ref 39.0–52.0)
Hemoglobin: 14.6 g/dL (ref 13.0–17.0)
Lymphocytes Relative: 33.1 % (ref 12.0–46.0)
Lymphs Abs: 1.4 K/uL (ref 0.7–4.0)
MCHC: 33.6 g/dL (ref 30.0–36.0)
MCV: 90.6 fl (ref 78.0–100.0)
Monocytes Absolute: 0.6 K/uL (ref 0.1–1.0)
Monocytes Relative: 14.2 % — ABNORMAL HIGH (ref 3.0–12.0)
Neutro Abs: 1.9 K/uL (ref 1.4–7.7)
Neutrophils Relative %: 43.6 % (ref 43.0–77.0)
Platelets: 209 K/uL (ref 150.0–400.0)
RBC: 4.79 Mil/uL (ref 4.22–5.81)
RDW: 13.4 % (ref 11.5–15.5)
WBC: 4.4 K/uL (ref 4.0–10.5)

## 2024-02-12 LAB — BASIC METABOLIC PANEL WITH GFR
BUN: 13 mg/dL (ref 6–23)
CO2: 30 meq/L (ref 19–32)
Calcium: 9.3 mg/dL (ref 8.4–10.5)
Chloride: 102 meq/L (ref 96–112)
Creatinine, Ser: 0.87 mg/dL (ref 0.40–1.50)
GFR: 81.18 mL/min (ref >60.00)
Glucose, Bld: 80 mg/dL (ref 70–99)
Potassium: 4.1 meq/L (ref 3.5–5.1)
Sodium: 139 meq/L (ref 135–145)

## 2024-02-12 LAB — HEMOGLOBIN A1C: Hgb A1c MFr Bld: 5.4 % (ref 4.6–6.5)

## 2024-02-12 LAB — TSH: TSH: 1.65 u[IU]/mL (ref 0.35–5.50)

## 2024-02-12 NOTE — Progress Notes (Signed)
 Subjective:    Patient ID: Gregory Wilkins, male    DOB: 04/19/1941, 83 y.o.   MRN: [id number]  DOS:  02/12/2024 Type of visit - description: Follow-up  Since the last visit, started to see the wellness clinic, on GLP-1's. Feeling well.  Has seldom issues w/  swallowing liquids, likely getting stuck in the throat.  Nothing with solids. Denies nausea or vomiting.  No diarrhea or blood in the stools.  Sometimes the urinary flow is a little slow in the morning but the rest of the day is fine.  No dysuria or gross hematuria.  Wt Readings from Last 3 Encounters:  02/12/24 189 lb 8 oz (86 kg)  12/24/23 206 lb (93.4 kg)  12/04/23 218 lb 1.3 oz (98.9 kg)     Review of Systems See above   Past Medical History:  Diagnosis Date   Acne rosacea 12/23/2006   Qualifier: Diagnosis of  By: Tish MD, Elsie   Dr Lorrayne Seashore   Formatting of this note might be different from the original. Overview:  Qualifier: Diagnosis of  By: Tish MD, Elsie   Dr Lorrayne Seashore Overview:  Overview:  Qualifier: Diagnosis of  By: Tish MD, Elsie   Dr Lorrayne Seashore  Overview:  Qualifier: Diagnosis of  By: Tish MD, Elsie   Dr Lorrayne Seashore Overview:  Overview:  Overview:   Acute meniscal tear, lateral 10/01/2021   Alcohol abuse    Allergic rhinitis 12/23/2006   Qualifier: Diagnosis of  By: Tish MD, Elsie   Onset:in high school Character: perennial but increased seasonally Triggers :cat, dust, pollen Allergy testing: Dr Amaryllis Finn Maintenance medications/ response:Flonase/ Nasonex; saline wash; Allegra Smoking history:age 73- 42, up to 2 ppd Family history pulmonary disease: no    Formatting of this note might be different from the original. 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 Overview:  Qualifier: Diagnosis of  By: Tish MD, Elsie Deal of this note might be different from the original. Overview:  Qualifier: Diagnosis of  By: Tish MD, William  IMO 10/01 Updates   Enlarged prostate without lower urinary tract symptoms (luts) 11/09/2008   Formatting of this note might be different from the original. Overview:  Qualifier: Diagnosis of  By: Tish MD, Elsie   No PMH of elevated PSA ; no biopsy Overview:  Overview:  Qualifier: Diagnosis of  By: Tish MD, Elsie   No PMH of elevated PSA ; no biopsy Overview:  Overview:  Overview:  Qualifier: Diagnosis of  By: Tish MD, Elsie   No PMH of elevated PSA ; no biopsy   Essential (primary) hypertension 09/21/2007   Qualifier: Diagnosis of  By: Tish MD, Elsie Deal of this note might be different from the original. Overview:  Qualifier: Diagnosis of  By: Tish MD, Elsie   Last Assessment &  Plan:  Blood pressure appears to be well controlled. Renal function will be checked.  He has a minor cough; this does not appear to be significant enough to warrant change to losartan  Overview:  Overview:  Qu   Family history of type C viral hepatitis 06/30/2012   Brother had liver transplant    Hepatitis A 04/2019   History of colonic polyps 12/23/2006   Qualifier: Diagnosis of  By: Tish MD, Elsie   Dr Chyrl Plough, GI Polypectomy X2 , 4/16 /2014. One was  transitional cell adenoma Due annually No FH colon cancer   Formatting of this note might be different from the original. Overview:  Qualifier: Diagnosis of  By: Tish MD, Elsie   Dr Chyrl Plough, GI  Polypectomy X2 , 4/16 /2014. One was  transitional cell adenoma Due annually No FH colon ca   History of urinary stone 09/21/2007   Qualifier: Diagnosis of  By: Tish MD, Elsie Gregory Wilkins , passed spontaneously No Urologist involvement   Formatting of this note might be different from the original. Overview:  Qualifier: Diagnosis of  By: Tish MD, Elsie Gregory Wilkins , passed spontaneously No Urologist involvement Overview:  Overview:  Qualifier: Diagnosis of  By: Tish MD, Elsie Gregory Wilkins , passed spontaneously No Urologist involvement    HYPERLIPIDEMIA 09/21/2007   Qualifier: Diagnosis of  By: Tish MD, Elsie SINE Lipoprofile 2006 : LDL  108 ( 1072  / 575  ),  HDL 63 , Triglycerides  40. LDL goal = < 135 ; ideally < 105. No FH CAD   Formatting of this note might be different from the original. Overview:  Qualifier: Diagnosis of  By: Tish MD, Elsie SINE Lipoprofile 2006 : LDL  108 ( 1072  / 575  ),  HDL 63 , Triglycerides  40. LDL goal = < 135 ; idea   Hypertension    Joint pain    Liver injury, initial encounter 05/17/2019   Macular degeneration 01/06/2018   Malignant neoplasm of skin    Mallet finger 2012     4th L DIP,Dr Sypher   Nephrolithiasis    Nocturnal hypoxia 11/04/2019   Nuclear sclerotic cataract of both eyes    Obesity (BMI 30-39.9) 01/04/2014   OSA on CPAP 05/17/2020   Osteoarthritis    PCP NOTES >>>>>>>>>>>>>>>> 05/26/2019   Personal history of other malignant neoplasm of skin 06/30/2012   Dr Rolan Molt, Derm X3 Local resection from  right forearm and forehead.   Mohs surgery of the nose 06/2012, Dr Jadine Deal of this note might be different from the original. Overview:  Dr Rolan Molt, Derm X3 Local resection from  right forearm and forehead.   Mohs surgery of the nose 06/2012, Dr Jadine Overview:  Overview:  Dr Rolan Molt, Derm X3 Local resection from  right forearm and fo   RLS (restless legs syndrome)    Sleep apnea     Past Surgical History:  Procedure Laterality  Date   basal cell cancer     X 3   EYE SURGERY     Cataract   INGUINAL HERNIA REPAIR  [age]   right,left   OPEN REDUCTION INTERNAL FIXATION (ORIF) METACARPAL Right 05/19/2013   Procedure: OPEN REDUCTION INTERNAL FIXATION  RIGHT SMALL  METACARPAL FRACTURE;  Surgeon: Lamar LULLA Leonor Mickey., MD;  Location: [facility];  Service: Orthopedics;  Laterality: Right;   POLYPECTOMY     X 3;  Dr Luis   WISDOM TOOTH EXTRACTION      Current Outpatient Medications  Medication Instructions   aspirin  EC 81 mg, Oral, Daily, Swallow whole.   atorvastatin  (LIPITOR) 10 mg, Oral, Daily at bedtime   b complex vitamins capsule 1 capsule, Daily   cetirizine (ZYRTEC) 10 mg, Daily   cholecalciferol (VITAMIN D3) 1,000 Units, Daily   clonazePAM  (KLONOPIN ) 0.5 MG tablet TAKE 1 TABLET BY MOUTH EVERY NIGHT AT BEDTIME AS NEEDED FOR RESTLESS LEG   Eliquis  5 mg, Oral, 2 times daily   hydrochlorothiazide  (HYDRODIURIL ) 12.5 mg, Oral, Daily   losartan  (COZAAR ) 25 mg, Oral, Daily   metoprolol  tartrate (LOPRESSOR ) 12.5 mg, As needed   Multiple Vitamin (MULTIVITAMIN WITH MINERALS) TABS tablet 1 tablet, Daily   Multiple Vitamins-Minerals (ICAPS AREDS 2 PO) 1 tablet, Daily   Zepbound 2.5 mg, Weekly       Objective:   Physical Exam BP 126/72   Pulse (!) 58   Temp 98.2 F (36.8 C) (Oral)   Resp 16   Ht 5' 8.5 (1.74 m)   Wt 189 lb 8 oz (86 kg)   SpO2 96%   BMI 28.39 kg/m  General:   Well developed, NAD, BMI noted. HEENT:  Normocephalic . Face symmetric, atraumatic. Neck: No lymphadenopathies, no thyromegaly Lungs:  CTA B Normal respiratory effort, no intercostal retractions, no accessory muscle use. Heart: RRR,  no murmur.  Lower extremities: no pretibial edema bilaterally  Skin: Not pale. Not jaundice Neurologic:  alert & oriented X3.  Speech normal, gait appropriate for age and unassisted Psych--  Cognition and judgment appear intact.  Cooperative with normal attention span and  concentration.  Behavior appropriate. No anxious or depressed appearing.      Assessment     Assessment (new patient 05/17/2019) HTN High Cholesterol Restless leg (clonazepam  prn) Parox. A. Flutter DX 02-2020 H/o kidney stone Chronic LE edema L>R, mild  Hepatitis a, acute 04-2019 OSA  04-2020, on Cpap MSK: DJD, spinal stenosis  CT November 2022, incidental findings:  -Cirrhosis . H/o heavy drinking quit ~ 1991, hep B&C negative - R adrenal adenoma, -COPD (h/o tobacco) - coronary calcifications Vitamin D  deficiency  PLAN Obesity: Started to lose weight after the wellness clinic, rx Zepbound.  Feeling great.  Eating healthier, controlling portions better, more active, significant weight loss noted.  Ambulatory BPs are not low, denies orthostatic dizziness. HTN: Continue losartan , HCTZ, metoprolol .  Monitor BPs, if BP noted to be low he is to let me know.  Check labs. High cholesterol: On atorvastatin , last LDL 60. Paroxysmal A-fib, coronary artery calcifications: Saw cardiology 12/04/2023. They  stressed importance of secondary prevention, LDL goal less than 60. Dysphagia: As described above, had a EGD few months ago per patient, we agreed on observation for now. Preventive care: Flu and COVID booster recommended RTC 6 months

## 2024-02-12 NOTE — Patient Instructions (Signed)
 It was good to see you today  Recommend a flu shot every fall. Consider a COVID booster  Check the  blood pressure regularly Blood pressure goal:  between 110/65 and  135/85. If it is consistently higher or lower, let me know     GO TO THE LAB :  Get the blood work   Your results will be posted on MyChart with my comments  Next office visit for a checkup in 6 months Please make an appointment before you leave today

## 2024-02-13 NOTE — Assessment & Plan Note (Signed)
 Obesity: Started to lose weight after the wellness clinic, rx Zepbound.  Feeling great.  Eating healthier, controlling portions better, more active, significant weight loss noted.  Ambulatory BPs are not low, denies orthostatic dizziness. HTN: Continue losartan , HCTZ, metoprolol .  Monitor BPs, if BP noted to be low he is to let me know.  Check labs. High cholesterol: On atorvastatin , last LDL 60. Paroxysmal A-fib, coronary artery calcifications: Saw cardiology 12/04/2023. They  stressed importance of secondary prevention, LDL goal less than 60. Dysphagia: As described above, had a EGD few months ago per patient, we agreed on observation for now. Preventive care: Flu and COVID booster recommended RTC 6 months

## 2024-02-15 ENCOUNTER — Ambulatory Visit: Payer: Self-pay | Admitting: Internal Medicine

## 2024-02-18 ENCOUNTER — Other Ambulatory Visit: Payer: Self-pay | Admitting: Internal Medicine

## 2024-02-24 ENCOUNTER — Ambulatory Visit (INDEPENDENT_AMBULATORY_CARE_PROVIDER_SITE_OTHER): Admitting: Psychology

## 2024-02-24 DIAGNOSIS — F4322 Adjustment disorder with anxiety: Secondary | ICD-10-CM

## 2024-02-24 NOTE — Progress Notes (Signed)
 [facility] Counselor/Therapist Progress Note  Patient ID: Gregory Wilkins, MRN: [id number],    Date: 02/24/2024  Time Spent: 9:00am-9:50am  50 minutes   Treatment Type: Individual Therapy  Reported Symptoms: stress  Mental Status Exam: Appearance:  Casual     Behavior: Appropriate  Motor: Normal  Speech/Language:  Normal Rate  Affect: Appropriate  Mood: normal  Thought process: normal  Thought content:   WNL  Sensory/Perceptual disturbances:   WNL  Orientation: oriented to person, place, time/date, and situation  Attention: Good  Concentration: Good  Memory: WNL  Fund of knowledge:  Good  Insight:   Good  Judgment:  Good  Impulse Control: Good   Risk Assessment: Danger to Self:  No Self-injurious Behavior: No Danger to Others: No Duty to Warn:no Physical Aggression / Violence:No  Access to Firearms a concern: No  Gang Involvement:No   Subjective: Pt present for face-to-face individual therapy via video.  Pt consents to telehealth video session and is aware of limitations and benefits of virtual sessions.  Location of pt: home Location of therapist: home office.   Pt talked about working on meditation and gratitude daily.  At times it is difficult for pt to come up with his gratitude list but her nudges himself to do it bc he knows it helps him. Pt talked about his family.   Pt is planning some [place]  Pt's relationships with his wife and son are stressful.  Addressed recent interactions and how they impacted pt.   Helped pt process family dynamics.  Pt talked about continuing to plan and look forward to things and he thinks that helps keep him young.   He is working on Physicist, medical.   Worked on self care strategies. Provided supportive therapy.  Interventions: Cognitive Behavioral Therapy and Insight-Oriented  Diagnosis:  F43.22  Plan of Care: Pt participated in setting treatment goals.   He is progressing  toward goals.  Pt  Plan to continue to meet every two weeks. Pt agrees with treatment plan.  Treatment Plan  (Treatment Plan Target Date 11/17/2024) Client Abilities/Strengths  Pt is bright, engaging and motivated for therapy.  Client Treatment Preferences  Individual therapy.  Client Statement of Needs  Improve coping skills.  Symptoms  Autonomic hyperactivity (e.g., palpitations, shortness of breath, dry mouth, trouble swallowing, nausea, diarrhea). Excessive and/or unrealistic worry that is difficult to control occurring more days than not for at least 6 months about a number of events or activities. Hypervigilance (e.g., feeling constantly on edge, experiencing concentration difficulties, having trouble falling or staying asleep, exhibiting a general state of irritability). Motor tension (e.g., restlessness, tiredness, shakiness, muscle tension). Problems Addressed  Anxiety Goals 1. Enhance ability to effectively cope with the full variety of life's worries and anxieties. 2. Learn and implement coping skills that result in a reduction of anxiety and worry, and improved daily functioning. Objective Learn to accept limitations in life and commit to tolerating, rather than avoiding, unpleasant emotions while accomplishing meaningful goals. Target Date: 2024-11-17 Frequency: Biweekly Progress: 65 Modality: individual Related Interventions 1. Use techniques from Acceptance and Commitment Therapy to help client accept uncomfortable realities such as lack of complete control, imperfections, and uncertainty and tolerate unpleasant emotions and thoughts in order to accomplish value-consistent goals. Objective Learn and implement problem-solving strategies for realistically addressing worries. Target Date: 2024-11-17 Frequency: Biweekly Progress: 65 Modality: individual Related Interventions 1. Assign the client a homework exercise in which he/she problem-solves a current  problem.  review,  reinforce success, and provide corrective feedback toward improvement. 2. Teach the client problem-solving strategies involving specifically defining a problem, generating options for addressing it, evaluating the pros and cons of each option, selecting and implementing an optional action, and reevaluating and refining the action. Objective Learn and implement calming skills to reduce overall anxiety and manage anxiety symptoms. Target Date: 2024-11-17 Frequency: Biweekly Progress: 65 Modality: individual Related Interventions 1. Assign the client to read about progressive muscle relaxation and other calming strategies in relevant books or treatment manuals (e.g., Progressive Relaxation Training by Thornell and Elmer; Mastery of Your Anxiety and Worry: Workbook by Richarda armin Given). 2. Assign the client homework each session in which he/she practices relaxation exercises daily, gradually applying them progressively from non-anxiety-provoking to anxiety-provoking situations; review and reinforce success while providing corrective feedback toward improvement. 3. Teach the client calming/relaxation skills (e.g., applied relaxation, progressive muscle relaxation, cue controlled relaxation; mindful breathing; biofeedback) and how to discriminate better between relaxation and tension; teach the client how to apply these skills to his/her daily life. 3. Reduce overall frequency, intensity, and duration of the anxiety so that daily functioning is not impaired. 4. Resolve the core conflict that is the source of anxiety. 5. Stabilize anxiety level while increasing ability to function on a daily basis. Diagnosis :    F43.22 Conditions For Discharge Achievement of treatment goals and objectives.   Gregory Mian, LCSW

## 2024-03-09 ENCOUNTER — Ambulatory Visit: Admitting: Psychology

## 2024-03-21 ENCOUNTER — Other Ambulatory Visit: Payer: Self-pay | Admitting: Internal Medicine

## 2024-03-21 ENCOUNTER — Other Ambulatory Visit: Payer: Self-pay | Admitting: Cardiology

## 2024-03-21 DIAGNOSIS — I4892 Unspecified atrial flutter: Secondary | ICD-10-CM

## 2024-03-22 NOTE — Telephone Encounter (Signed)
 Prescription refill request for Eliquis  received. Indication: A Flutter Last office visit: 12/04/23  R Revankar MD Scr: 0.87 on 02/12/24  Epic Age: 83 Weight: 98.9kg  Based on above findings Eliquis  5mg  twice daily is the appropriate dose.  Refill approved.

## 2024-03-23 ENCOUNTER — Ambulatory Visit (INDEPENDENT_AMBULATORY_CARE_PROVIDER_SITE_OTHER): Admitting: Psychology

## 2024-03-23 DIAGNOSIS — F4322 Adjustment disorder with anxiety: Secondary | ICD-10-CM | POA: Diagnosis not present

## 2024-03-23 NOTE — Progress Notes (Signed)
 [facility] Counselor/Therapist Progress Note  Patient ID: RAYDEL HOSICK, MRN: [id number],    Date: 03/23/2024  Time Spent: 9:00am-9:50am  50 minutes   Treatment Type: Individual Therapy  Reported Symptoms: stress  Mental Status Exam: Appearance:  Casual     Behavior: Appropriate  Motor: Normal  Speech/Language:  Normal Rate  Affect: Appropriate  Mood: normal  Thought process: normal  Thought content:   WNL  Sensory/Perceptual disturbances:   WNL  Orientation: oriented to person, place, time/date, and situation  Attention: Good  Concentration: Good  Memory: WNL  Fund of knowledge:  Good  Insight:   Good  Judgment:  Good  Impulse Control: Good   Risk Assessment: Danger to Self:  No Self-injurious Behavior: No Danger to Others: No Duty to Warn:no Physical Aggression / Violence:No  Access to Firearms a concern: No  Gang Involvement:No   Subjective: Pt present for face-to-face individual therapy via video.  Pt consents to telehealth video session and is aware of limitations and benefits of virtual sessions.  Location of pt: home Location of therapist: home office.   Pt talked about his family.   Pt has been frustrated with his wife bc she does not compromise and tends to be bossy.  Addressed recent interactions and how they impacted pt.  Pt's son has also been very difficult and pt's wife has threatened to kick him out. Pt is often put in the middle of the fights between his son and wife.   Helped pt process family dynamics.  Pt talked about his health.  He has lost 75 lbs in the past couple of years and has not reached his goal weight.  He has been eating more healthily and exercises daily.   Acknowledged pt for his success.  Pt talked about his feelings about being different.   At times his feelings are hurt when his family calls him weird.   Addressed pt's uniqueness and worked on Special educational needs teacher.   Worked on self care strategies. Provided  supportive therapy.  Interventions: Cognitive Behavioral Therapy and Insight-Oriented  Diagnosis:  F43.22  Plan of Care: Pt participated in setting treatment goals.   He is progressing toward goals.  Pt  Plan to continue to meet every two weeks. Pt agrees with treatment plan.  Treatment Plan  (Treatment Plan Target Date 11/17/2024) Client Abilities/Strengths  Pt is bright, engaging and motivated for therapy.  Client Treatment Preferences  Individual therapy.  Client Statement of Needs  Improve coping skills.  Symptoms  Autonomic hyperactivity (e.g., palpitations, shortness of breath, dry mouth, trouble swallowing, nausea, diarrhea). Excessive and/or unrealistic worry that is difficult to control occurring more days than not for at least 6 months about a number of events or activities. Hypervigilance (e.g., feeling constantly on edge, experiencing concentration difficulties, having trouble falling or staying asleep, exhibiting a general state of irritability). Motor tension (e.g., restlessness, tiredness, shakiness, muscle tension). Problems Addressed  Anxiety Goals 1. Enhance ability to effectively cope with the full variety of life's worries and anxieties. 2. Learn and implement coping skills that result in a reduction of anxiety and worry, and improved daily functioning. Objective Learn to accept limitations in life and commit to tolerating, rather than avoiding, unpleasant emotions while accomplishing meaningful goals. Target Date: 2024-11-17 Frequency: Biweekly Progress: 65 Modality: individual Related Interventions 1. Use techniques from Acceptance and Commitment Therapy to help client accept uncomfortable realities such as lack of complete control, imperfections, and uncertainty and tolerate unpleasant emotions and thoughts in order  to accomplish value-consistent goals. Objective Learn and implement problem-solving strategies for realistically addressing worries. Target Date:  2024-11-17 Frequency: Biweekly Progress: 65 Modality: individual Related Interventions 1. Assign the client a homework exercise in which he/she problem-solves a current problem.  review, reinforce success, and provide corrective feedback toward improvement. 2. Teach the client problem-solving strategies involving specifically defining a problem, generating options for addressing it, evaluating the pros and cons of each option, selecting and implementing an optional action, and reevaluating and refining the action. Objective Learn and implement calming skills to reduce overall anxiety and manage anxiety symptoms. Target Date: 2024-11-17 Frequency: Biweekly Progress: 65 Modality: individual Related Interventions 1. Assign the client to read about progressive muscle relaxation and other calming strategies in relevant books or treatment manuals (e.g., Progressive Relaxation Training by Thornell and Elmer; Mastery of Your Anxiety and Worry: Workbook by Richarda armin Given). 2. Assign the client homework each session in which he/she practices relaxation exercises daily, gradually applying them progressively from non-anxiety-provoking to anxiety-provoking situations; review and reinforce success while providing corrective feedback toward improvement. 3. Teach the client calming/relaxation skills (e.g., applied relaxation, progressive muscle relaxation, cue controlled relaxation; mindful breathing; biofeedback) and how to discriminate better between relaxation and tension; teach the client how to apply these skills to his/her daily life. 3. Reduce overall frequency, intensity, and duration of the anxiety so that daily functioning is not impaired. 4. Resolve the core conflict that is the source of anxiety. 5. Stabilize anxiety level while increasing ability to function on a daily basis. Diagnosis :    F43.22 Conditions For Discharge Achievement of treatment goals and objectives.   Devita Nies,  LCSW

## 2024-04-06 ENCOUNTER — Ambulatory Visit (INDEPENDENT_AMBULATORY_CARE_PROVIDER_SITE_OTHER): Admitting: Psychology

## 2024-04-06 DIAGNOSIS — F4322 Adjustment disorder with anxiety: Secondary | ICD-10-CM | POA: Diagnosis not present

## 2024-04-06 NOTE — Progress Notes (Signed)
 [facility] Counselor/Therapist Progress Note  Patient ID: ERMIN PARISIEN, MRN: [id number],    Date: 04/06/2024  Time Spent: 9:00am-9:50am  50 minutes   Treatment Type: Individual Therapy  Reported Symptoms: stress  Mental Status Exam: Appearance:  Casual     Behavior: Appropriate  Motor: Normal  Speech/Language:  Normal Rate  Affect: Appropriate  Mood: normal  Thought process: normal  Thought content:   WNL  Sensory/Perceptual disturbances:   WNL  Orientation: oriented to person, place, time/date, and situation  Attention: Good  Concentration: Good  Memory: WNL  Fund of knowledge:  Good  Insight:   Good  Judgment:  Good  Impulse Control: Good   Risk Assessment: Danger to Self:  No Self-injurious Behavior: No Danger to Others: No Duty to Warn:no Physical Aggression / Violence:No  Access to Firearms a concern: No  Gang Involvement:No   Subjective: Pt present for face-to-face individual therapy via video.  Pt consents to telehealth video session and is aware of limitations and benefits of virtual sessions.  Location of pt: home Location of therapist: home office.   Pt talked about his family.   Pt's son had a meeting at [facility] and was prescribed Welbutrin.   Pt hopes that his son will follow through with therapy.   Pt has had talks with his son Kel about needing to do something other than hanging around the house and eating.   Pt gets frustrated that his discussions with his son do not yield good results.   Pt states he has also had arguments with his wife [name] about their son and their will and trust.   The argument got so bad that pt offered to leave but told [name] divorce would not financially be in her best interest.   Helped pt process his feelings and family dynamics.  Pt talked about working on not worrying about the future.  He is working on present moment mindfulness.   Pt talked about his feelings about being different.  Addressed pt's  uniqueness and worked on Special educational needs teacher.   Worked on self care strategies. Provided supportive therapy.  Interventions: Cognitive Behavioral Therapy and Insight-Oriented  Diagnosis:  F43.22  Plan of Care: Pt participated in setting treatment goals.   He is progressing toward goals.  Pt  Plan to continue to meet every two weeks. Pt agrees with treatment plan.  Treatment Plan  (Treatment Plan Target Date 11/17/2024) Client Abilities/Strengths  Pt is bright, engaging and motivated for therapy.  Client Treatment Preferences  Individual therapy.  Client Statement of Needs  Improve coping skills.  Symptoms  Autonomic hyperactivity (e.g., palpitations, shortness of breath, dry mouth, trouble swallowing, nausea, diarrhea). Excessive and/or unrealistic worry that is difficult to control occurring more days than not for at least 6 months about a number of events or activities. Hypervigilance (e.g., feeling constantly on edge, experiencing concentration difficulties, having trouble falling or staying asleep, exhibiting a general state of irritability). Motor tension (e.g., restlessness, tiredness, shakiness, muscle tension). Problems Addressed  Anxiety Goals 1. Enhance ability to effectively cope with the full variety of life's worries and anxieties. 2. Learn and implement coping skills that result in a reduction of anxiety and worry, and improved daily functioning. Objective Learn to accept limitations in life and commit to tolerating, rather than avoiding, unpleasant emotions while accomplishing meaningful goals. Target Date: 2024-11-17 Frequency: Biweekly Progress: 65 Modality: individual Related Interventions 1. Use techniques from Acceptance and Commitment Therapy to help client accept uncomfortable realities such as  lack of complete control, imperfections, and uncertainty and tolerate unpleasant emotions and thoughts in order to accomplish value-consistent goals. Objective Learn and  implement problem-solving strategies for realistically addressing worries. Target Date: 2024-11-17 Frequency: Biweekly Progress: 65 Modality: individual Related Interventions 1. Assign the client a homework exercise in which he/she problem-solves a current problem.  review, reinforce success, and provide corrective feedback toward improvement. 2. Teach the client problem-solving strategies involving specifically defining a problem, generating options for addressing it, evaluating the pros and cons of each option, selecting and implementing an optional action, and reevaluating and refining the action. Objective Learn and implement calming skills to reduce overall anxiety and manage anxiety symptoms. Target Date: 2024-11-17 Frequency: Biweekly Progress: 65 Modality: individual Related Interventions 1. Assign the client to read about progressive muscle relaxation and other calming strategies in relevant books or treatment manuals (e.g., Progressive Relaxation Training by Thornell and Elmer; Mastery of Your Anxiety and Worry: Workbook by Richarda armin Given). 2. Assign the client homework each session in which he/she practices relaxation exercises daily, gradually applying them progressively from non-anxiety-provoking to anxiety-provoking situations; review and reinforce success while providing corrective feedback toward improvement. 3. Teach the client calming/relaxation skills (e.g., applied relaxation, progressive muscle relaxation, cue controlled relaxation; mindful breathing; biofeedback) and how to discriminate better between relaxation and tension; teach the client how to apply these skills to his/her daily life. 3. Reduce overall frequency, intensity, and duration of the anxiety so that daily functioning is not impaired. 4. Resolve the core conflict that is the source of anxiety. 5. Stabilize anxiety level while increasing ability to function on a daily basis. Diagnosis :    F43.22 Conditions  For Discharge Achievement of treatment goals and objectives.   Dontell Mian, LCSW

## 2024-04-20 ENCOUNTER — Ambulatory Visit (INDEPENDENT_AMBULATORY_CARE_PROVIDER_SITE_OTHER): Admitting: Psychology

## 2024-04-20 DIAGNOSIS — F4322 Adjustment disorder with anxiety: Secondary | ICD-10-CM

## 2024-04-20 NOTE — Progress Notes (Signed)
 [facility] Counselor/Therapist Progress Note  Patient ID: Gregory Wilkins, MRN: [id number],    Date: 04/20/2024  Time Spent: 9:00am-9:50am  50 minutes   Treatment Type: Individual Therapy  Reported Symptoms: stress  Mental Status Exam: Appearance:  Casual     Behavior: Appropriate  Motor: Normal  Speech/Language:  Normal Rate  Affect: Appropriate  Mood: normal  Thought process: normal  Thought content:   WNL  Sensory/Perceptual disturbances:   WNL  Orientation: oriented to person, place, time/date, and situation  Attention: Good  Concentration: Good  Memory: WNL  Fund of knowledge:  Good  Insight:   Good  Judgment:  Good  Impulse Control: Good   Risk Assessment: Danger to Self:  No Self-injurious Behavior: No Danger to Others: No Duty to Warn:no Physical Aggression / Violence:No  Access to Firearms a concern: No  Gang Involvement:No   Subjective: Pt present for face-to-face individual therapy via video.  Pt consents to telehealth video session and is aware of limitations and benefits of virtual sessions.  Location of pt: home Location of therapist: home office.   Pt talked about his family.   [place] home today and then his wife [name] will be in the mountains for a few days.  Pt is glad to have a break from her.  Pt's son Janari has seen a psychiatrist and was prescribed an antidepressant.  Pt states there have not been recent altercations with [employer] but he gets tired of babysitting his 77 yo son.   Helped pt process his feelings and family dynamics.  Pt states he can be impatient with Norleen which bothers him.   Pt talked about working on not worrying about the future.  He is working on present moment mindfulness.   Worked on self care strategies. Provided supportive therapy.  Interventions: Cognitive Behavioral Therapy and Insight-Oriented  Diagnosis:  F43.22  Plan of Care: Pt participated in setting  treatment goals.   He is progressing toward goals.  Pt  Plan to continue to meet every two weeks. Pt agrees with treatment plan.  Treatment Plan  (Treatment Plan Target Date 11/17/2024) Client Abilities/Strengths  Pt is bright, engaging and motivated for therapy.  Client Treatment Preferences  Individual therapy.  Client Statement of Needs  Improve coping skills.  Symptoms  Autonomic hyperactivity (e.g., palpitations, shortness of breath, dry mouth, trouble swallowing, nausea, diarrhea). Excessive and/or unrealistic worry that is difficult to control occurring more days than not for at least 6 months about a number of events or activities. Hypervigilance (e.g., feeling constantly on edge, experiencing concentration difficulties, having trouble falling or staying asleep, exhibiting a general state of irritability). Motor tension (e.g., restlessness, tiredness, shakiness, muscle tension). Problems Addressed  Anxiety Goals 1. Enhance ability to effectively cope with the full variety of life's worries and anxieties. 2. Learn and implement coping skills that result in a reduction of anxiety and worry, and improved daily functioning. Objective Learn to accept limitations in life and commit to tolerating, rather than avoiding, unpleasant emotions while accomplishing meaningful goals. Target Date: 2024-11-17 Frequency: Biweekly Progress: 65 Modality: individual Related Interventions 1. Use techniques from Acceptance and Commitment Therapy to help client accept uncomfortable realities such as lack of complete control, imperfections, and uncertainty and tolerate unpleasant emotions and thoughts in order to accomplish value-consistent goals. Objective Learn and implement problem-solving strategies for realistically addressing worries. Target Date: 2024-11-17 Frequency: Biweekly Progress: 65 Modality: individual Related Interventions 1.  Assign the client a homework exercise in which he/she  problem-solves a current problem.  review, reinforce success, and provide corrective feedback toward improvement. 2. Teach the client problem-solving strategies involving specifically defining a problem, generating options for addressing it, evaluating the pros and cons of each option, selecting and implementing an optional action, and reevaluating and refining the action. Objective Learn and implement calming skills to reduce overall anxiety and manage anxiety symptoms. Target Date: 2024-11-17 Frequency: Biweekly Progress: 65 Modality: individual Related Interventions 1. Assign the client to read about progressive muscle relaxation and other calming strategies in relevant books or treatment manuals (e.g., Progressive Relaxation Training by Thornell and Elmer; Mastery of Your Anxiety and Worry: Workbook by Richarda armin Given). 2. Assign the client homework each session in which he/she practices relaxation exercises daily, gradually applying them progressively from non-anxiety-provoking to anxiety-provoking situations; review and reinforce success while providing corrective feedback toward improvement. 3. Teach the client calming/relaxation skills (e.g., applied relaxation, progressive muscle relaxation, cue controlled relaxation; mindful breathing; biofeedback) and how to discriminate better between relaxation and tension; teach the client how to apply these skills to his/her daily life. 3. Reduce overall frequency, intensity, and duration of the anxiety so that daily functioning is not impaired. 4. Resolve the core conflict that is the source of anxiety. 5. Stabilize anxiety level while increasing ability to function on a daily basis. Diagnosis :    F43.22 Conditions For Discharge Achievement of treatment goals and objectives.   Zayra Devito, LCSW

## 2024-05-02 ENCOUNTER — Other Ambulatory Visit (HOSPITAL_BASED_OUTPATIENT_CLINIC_OR_DEPARTMENT_OTHER): Payer: Self-pay

## 2024-05-02 MED ORDER — FLUZONE HIGH-DOSE 0.5 ML IM SUSY
0.5000 mL | PREFILLED_SYRINGE | Freq: Once | INTRAMUSCULAR | 0 refills | Status: AC
Start: 2024-05-02 — End: 2024-05-03
  Filled 2024-05-02: qty 0.5, 1d supply, fill #0

## 2024-05-04 ENCOUNTER — Ambulatory Visit: Admitting: Psychology

## 2024-05-04 DIAGNOSIS — F4322 Adjustment disorder with anxiety: Secondary | ICD-10-CM | POA: Diagnosis not present

## 2024-05-04 NOTE — Progress Notes (Signed)
 [facility] Counselor/Therapist Progress Note  Patient ID: CROCKETT RALLO, MRN: [id number],    Date: 05/04/2024  Time Spent: 9:00am-9:50am  50 minutes   Treatment Type: Individual Therapy  Reported Symptoms: stress  Mental Status Exam: Appearance:  Casual     Behavior: Appropriate  Motor: Normal  Speech/Language:  Normal Rate  Affect: Appropriate  Mood: normal  Thought process: normal  Thought content:   WNL  Sensory/Perceptual disturbances:   WNL  Orientation: oriented to person, place, time/date, and situation  Attention: Good  Concentration: Good  Memory: WNL  Fund of knowledge:  Good  Insight:   Good  Judgment:  Good  Impulse Control: Good   Risk Assessment: Danger to Self:  No Self-injurious Behavior: No Danger to Others: No Duty to Warn:no Physical Aggression / Violence:No  Access to Firearms a concern: No  Gang Involvement:No   Subjective: Pt present for face-to-face individual therapy via video.  Pt consents to telehealth video session and is aware of limitations and benefits of virtual sessions.  Location of pt: home Location of therapist: home office.   Pt talked about planning more mountain house trips so he and his wife can have some respite time from each other.   Pt talked about his family.   Pt's wife [name] criticizes pt about his social interactions and about issues with Norleen.  Addressed how this impacts pt.  Helped pt process his feelings and family dynamics.   Pt talked about his end of life care preferences.  He does not want to go to a nursing home but also does not want to burden his wife with his care.  Pt has a strong sense of duty to family and tends to put their needs before his own.  Worked on self care strategies.  Pt does a lot of yoga and walking.  Provided supportive therapy.  Interventions: Cognitive Behavioral Therapy and Insight-Oriented  Diagnosis:  F43.22  Plan of Care: Pt participated in setting treatment  goals.   He is progressing toward goals.  Pt  Plan to continue to meet every two weeks. Pt agrees with treatment plan.  Treatment Plan  (Treatment Plan Target Date 11/17/2024) Client Abilities/Strengths  Pt is bright, engaging and motivated for therapy.  Client Treatment Preferences  Individual therapy.  Client Statement of Needs  Improve coping skills.  Symptoms  Autonomic hyperactivity (e.g., palpitations, shortness of breath, dry mouth, trouble swallowing, nausea, diarrhea). Excessive and/or unrealistic worry that is difficult to control occurring more days than not for at least 6 months about a number of events or activities. Hypervigilance (e.g., feeling constantly on edge, experiencing concentration difficulties, having trouble falling or staying asleep, exhibiting a general state of irritability). Motor tension (e.g., restlessness, tiredness, shakiness, muscle tension). Problems Addressed  Anxiety Goals 1. Enhance ability to effectively cope with the full variety of life's worries and anxieties. 2. Learn and implement coping skills that result in a reduction of anxiety and worry, and improved daily functioning. Objective Learn to accept limitations in life and commit to tolerating, rather than avoiding, unpleasant emotions while accomplishing meaningful goals. Target Date: 2024-11-17 Frequency: Biweekly Progress: 65 Modality: individual Related Interventions 1. Use techniques from Acceptance and Commitment Therapy to help client accept uncomfortable realities such as lack of complete control, imperfections, and uncertainty and tolerate unpleasant emotions and thoughts in order to accomplish value-consistent goals. Objective Learn and implement problem-solving strategies for realistically addressing worries. Target Date: 2024-11-17 Frequency: Biweekly Progress: 65 Modality: individual Related Interventions 1.  Assign the client a homework exercise in which he/she problem-solves a  current problem.  review, reinforce success, and provide corrective feedback toward improvement. 2. Teach the client problem-solving strategies involving specifically defining a problem, generating options for addressing it, evaluating the pros and cons of each option, selecting and implementing an optional action, and reevaluating and refining the action. Objective Learn and implement calming skills to reduce overall anxiety and manage anxiety symptoms. Target Date: 2024-11-17 Frequency: Biweekly Progress: 65 Modality: individual Related Interventions 1. Assign the client to read about progressive muscle relaxation and other calming strategies in relevant books or treatment manuals (e.g., Progressive Relaxation Training by Thornell and Elmer; Mastery of Your Anxiety and Worry: Workbook by Richarda armin Given). 2. Assign the client homework each session in which he/she practices relaxation exercises daily, gradually applying them progressively from non-anxiety-provoking to anxiety-provoking situations; review and reinforce success while providing corrective feedback toward improvement. 3. Teach the client calming/relaxation skills (e.g., applied relaxation, progressive muscle relaxation, cue controlled relaxation; mindful breathing; biofeedback) and how to discriminate better between relaxation and tension; teach the client how to apply these skills to his/her daily life. 3. Reduce overall frequency, intensity, and duration of the anxiety so that daily functioning is not impaired. 4. Resolve the core conflict that is the source of anxiety. 5. Stabilize anxiety level while increasing ability to function on a daily basis. Diagnosis :    F43.22 Conditions For Discharge Achievement of treatment goals and objectives.   Gyneth Hubka, LCSW

## 2024-05-18 ENCOUNTER — Ambulatory Visit: Admitting: Psychology

## 2024-05-18 DIAGNOSIS — F4322 Adjustment disorder with anxiety: Secondary | ICD-10-CM | POA: Diagnosis not present

## 2024-05-18 NOTE — Progress Notes (Signed)
 [facility] Counselor/Therapist Progress Note  Patient ID: Gregory Wilkins, MRN: [id number],    Date: 05/18/2024  Time Spent: 9:00am-9:50am  50 minutes   Treatment Type: Individual Therapy  Reported Symptoms: stress  Mental Status Exam: Appearance:  Casual     Behavior: Appropriate  Motor: Normal  Speech/Language:  Normal Rate  Affect: Appropriate  Mood: normal  Thought process: normal  Thought content:   WNL  Sensory/Perceptual disturbances:   WNL  Orientation: oriented to person, place, time/date, and situation  Attention: Good  Concentration: Good  Memory: WNL  Fund of knowledge:  Good  Insight:   Good  Judgment:  Good  Impulse Control: Good   Risk Assessment: Danger to Self:  No Self-injurious Behavior: No Danger to Others: No Duty to Warn:no Physical Aggression / Violence:No  Access to Firearms a concern: No  Gang Involvement:No   Subjective: Pt present for face-to-face individual therapy via video.  Pt consents to telehealth video session and is aware of limitations and benefits of virtual sessions.  Location of pt: home Location of therapist: home office.   Pt talked about his son Rylyn who saw the psychiatrist yesterday and got a referral for a counselor.   Pt hopes his son follows through with seeing the counselor.   Pt states it is very difficult living with a 7 yo adult child who is an addict and has mental health issues.   Pt gets exhausted and frustrated.   Pt talked about his wife [name].  She wants to move to a [facility] at [employer] but pt does not want to leave his house.  A spot became available but they turned it down for now.  Pt and wife disagree about the timing for planning to move.  They don't know where their son will live if they move.  Helped pt process his feelings and family dynamics.   Worked on self care strategies.  Pt does a lot of yoga and walking.  Provided supportive therapy.  Interventions: Cognitive Behavioral  Therapy and Insight-Oriented  Diagnosis:  F43.22  Plan of Care: Pt participated in setting treatment goals.   He is progressing toward goals.  Pt  Plan to continue to meet every two weeks. Pt agrees with treatment plan.  Treatment Plan  (Treatment Plan Target Date 11/17/2024) Client Abilities/Strengths  Pt is bright, engaging and motivated for therapy.  Client Treatment Preferences  Individual therapy.  Client Statement of Needs  Improve coping skills.  Symptoms  Autonomic hyperactivity (e.g., palpitations, shortness of breath, dry mouth, trouble swallowing, nausea, diarrhea). Excessive and/or unrealistic worry that is difficult to control occurring more days than not for at least 6 months about a number of events or activities. Hypervigilance (e.g., feeling constantly on edge, experiencing concentration difficulties, having trouble falling or staying asleep, exhibiting a general state of irritability). Motor tension (e.g., restlessness, tiredness, shakiness, muscle tension). Problems Addressed  Anxiety Goals 1. Enhance ability to effectively cope with the full variety of life's worries and anxieties. 2. Learn and implement coping skills that result in a reduction of anxiety and worry, and improved daily functioning. Objective Learn to accept limitations in life and commit to tolerating, rather than avoiding, unpleasant emotions while accomplishing meaningful goals. Target Date: 2024-11-17 Frequency: Biweekly Progress: 65 Modality: individual Related Interventions 1. Use techniques from Acceptance and Commitment Therapy to help client accept uncomfortable realities such as lack of complete control, imperfections, and uncertainty and tolerate unpleasant emotions and thoughts in order to accomplish value-consistent  goals. Objective Learn and implement problem-solving strategies for realistically addressing worries. Target Date: 2024-11-17 Frequency: Biweekly Progress: 65 Modality:  individual Related Interventions 1. Assign the client a homework exercise in which he/she problem-solves a current problem.  review, reinforce success, and provide corrective feedback toward improvement. 2. Teach the client problem-solving strategies involving specifically defining a problem, generating options for addressing it, evaluating the pros and cons of each option, selecting and implementing an optional action, and reevaluating and refining the action. Objective Learn and implement calming skills to reduce overall anxiety and manage anxiety symptoms. Target Date: 2024-11-17 Frequency: Biweekly Progress: 65 Modality: individual Related Interventions 1. Assign the client to read about progressive muscle relaxation and other calming strategies in relevant books or treatment manuals (e.g., Progressive Relaxation Training by Thornell and Elmer; Mastery of Your Anxiety and Worry: Workbook by Richarda armin Given). 2. Assign the client homework each session in which he/she practices relaxation exercises daily, gradually applying them progressively from non-anxiety-provoking to anxiety-provoking situations; review and reinforce success while providing corrective feedback toward improvement. 3. Teach the client calming/relaxation skills (e.g., applied relaxation, progressive muscle relaxation, cue controlled relaxation; mindful breathing; biofeedback) and how to discriminate better between relaxation and tension; teach the client how to apply these skills to his/her daily life. 3. Reduce overall frequency, intensity, and duration of the anxiety so that daily functioning is not impaired. 4. Resolve the core conflict that is the source of anxiety. 5. Stabilize anxiety level while increasing ability to function on a daily basis. Diagnosis :    F43.22 Conditions For Discharge Achievement of treatment goals and objectives.   Augustine Leverette, LCSW

## 2024-05-19 ENCOUNTER — Encounter: Payer: Self-pay | Admitting: Internal Medicine

## 2024-05-26 ENCOUNTER — Ambulatory Visit (INDEPENDENT_AMBULATORY_CARE_PROVIDER_SITE_OTHER): Admitting: Medical

## 2024-05-26 VITALS — BP 119/68 | HR 63 | Temp 97.8°F | Resp 14 | Ht 68.5 in | Wt 182.8 lb

## 2024-05-26 DIAGNOSIS — K4090 Unilateral inguinal hernia, without obstruction or gangrene, not specified as recurrent: Secondary | ICD-10-CM

## 2024-05-26 NOTE — Progress Notes (Signed)
 Subjective:    Patient ID: Gregory Wilkins Andra DOUGLAS, male    DOB: [birth year], 83 y.o.   MRN: [id number]  HPI  BRECCAN GALANT III is an 83 year old male who presents with a suspected left inguinal hernia.  He noticed a sensation of pulling in his left front groin area approximately two weeks ago, initially attributing it to a fold of skin due to recent weight loss. He has a history of bilateral hernia repairs, with surgeries at ages [age] and [age] or 57. As a child, he recalls having to lay down and push the hernia back in.  The current sensation is occasional slight tender but not painful, with some rigidity in the area. Symptoms began after a yoga session, which he practices daily. He first noticed the issue while getting out of the shower, and it has been present for about a month, with the protrusion becoming apparent two weeks ago.  No severe pain, fever, chills, nausea, or vomiting. His appetite remains normal.        Review of Systems  Constitutional:  Negative for chills and fever.  Respiratory:  Negative for cough, chest tightness, shortness of breath and wheezing.   Cardiovascular:  Negative for chest pain and palpitations.  Gastrointestinal:  Negative for abdominal distention, abdominal pain, blood in stool, constipation, nausea, rectal pain and vomiting.  Genitourinary:  Negative for dysuria and flank pain.       See hpi  Musculoskeletal:  Negative for back pain.  Skin:  Negative for rash.  Neurological:  Negative for dizziness, tremors, seizures and light-headedness.  Hematological:  Negative for adenopathy.  Psychiatric/Behavioral:  Negative for behavioral problems and dysphoric mood.      Past Medical History:  Diagnosis Date   Acne rosacea 12/23/2006   Qualifier: Diagnosis of  By: Tish MD, Elsie   Dr Lorrayne Seashore   Formatting of this note might be different from the original. Overview:  Qualifier: Diagnosis of  By: Tish MD, Elsie   Dr Lorrayne Seashore Overview:   Overview:  Qualifier: Diagnosis of  By: Tish MD, Elsie   Dr Lorrayne Seashore  Overview:  Qualifier: Diagnosis of  By: Tish MD, Elsie   Dr Lorrayne Seashore Overview:  Overview:  Overview:   Acute meniscal tear, lateral 10/01/2021   Alcohol abuse    Allergic rhinitis 12/23/2006   Qualifier: Diagnosis of  By: Tish MD, Elsie   Onset:in high school Character: perennial but increased seasonally Triggers :cat, dust, pollen Allergy testing: Dr Amaryllis Finn Maintenance medications/ response:Flonase/ Nasonex; saline wash; Allegra Smoking history:age 7- 42, up to 2 ppd Family history pulmonary disease: no    Formatting of this note might be different from the original. 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 Overview:  Qualifier: Diagnosis of  By: Tish MD, Elsie Deal of this note might be different  from the original. Overview:  Qualifier: Diagnosis of  By: Tish MD, Elsie SCULL 10/01 Updates   Enlarged prostate without lower urinary tract symptoms (luts) 11/09/2008   Formatting of this note might be different from the original. Overview:  Qualifier: Diagnosis of  By: Tish MD, Elsie   No PMH of elevated PSA ; no biopsy Overview:  Overview:  Qualifier: Diagnosis of  By: Tish MD, Elsie   No PMH of elevated PSA ; no biopsy Overview:  Overview:  Overview:  Qualifier: Diagnosis of  By: Tish MD, Elsie   No PMH of  elevated PSA ; no biopsy   Essential (primary) hypertension 09/21/2007   Qualifier: Diagnosis of  By: Tish MD, Elsie Deal of this note might be different from the original. Overview:  Qualifier: Diagnosis of  By: Tish MD, Elsie   Last Assessment & Plan:  Blood pressure appears to be well controlled. Renal function will be checked.  He has a minor cough; this does not appear to be significant enough to warrant change to losartan  Overview:  Overview:  Qu   Family history of type C viral hepatitis 06/30/2012   Brother had liver transplant    Hepatitis A 04/2019   History of colonic polyps 12/23/2006   Qualifier: Diagnosis of  By: Tish MD, Elsie   Dr Chyrl Plough, GI Polypectomy X2 , 4/16 /2014. One was  transitional cell adenoma Due annually No FH colon cancer   Formatting of this note might be different from the original. Overview:  Qualifier: Diagnosis of  By: Tish MD, Elsie   Dr Chyrl Plough, GI Polypectomy X2 , 4/16 /2014. One was  transitional cell adenoma Due annually No FH colon ca   History of urinary stone 09/21/2007   Qualifier: Diagnosis of  By: Tish MD, Elsie BLACKWOOD , passed spontaneously No Urologist involvement   Formatting of this note might be different from the original. Overview:  Qualifier: Diagnosis of  By: Tish MD, Elsie BLACKWOOD , passed spontaneously No Urologist involvement Overview:  Overview:  Qualifier: Diagnosis of  By: Tish MD, Elsie BLACKWOOD , passed spontaneously No Urologist involvement    HYPERLIPIDEMIA 09/21/2007   Qualifier: Diagnosis of  By: Tish MD, Elsie SINE Lipoprofile 2006 : LDL  108 ( 1072  / 575  ),  HDL 63 , Triglycerides  40. LDL goal = < 135 ; ideally < 105. No FH CAD   Formatting of this note might be different from the original. Overview:  Qualifier: Diagnosis of  By: Tish MD, Elsie SINE Lipoprofile 2006 : LDL  108 ( 1072  / 575  ),  HDL 63 , Triglycerides  40. LDL goal = < 135 ; idea   Hypertension    Joint pain    Liver  injury, initial encounter 05/17/2019   Macular degeneration 01/06/2018   Malignant neoplasm of skin    Mallet finger 2012     4th L DIP,Dr Sypher   Nephrolithiasis    Nocturnal hypoxia 11/04/2019   Nuclear sclerotic cataract of both eyes    Obesity (BMI 30-39.9) 01/04/2014   OSA on CPAP 05/17/2020   Osteoarthritis    PCP NOTES >>>>>>>>>>>>>>>> 05/26/2019   Personal history of other malignant neoplasm of skin 06/30/2012   Dr Rolan Molt, Derm X3 Local resection from  right forearm and forehead.   Mohs surgery of the nose 06/2012, Dr Jadine Deal of this note might  be different from the original. Overview:  Dr Rolan Molt, Derm X3 Local resection from  right forearm and forehead.   Mohs surgery of the nose 06/2012, Dr Jadine Overview:  Overview:  Dr Rolan Molt, Derm X3 Local resection from  right forearm and fo   RLS (restless legs syndrome)    Sleep apnea      Social History   Socioeconomic History   Marital status: Married    Spouse name: [name]    Number of children: Not on file   Years of education: Not on file   Highest education level: Master's degree (e.g., MA, MS, MEng, MEd, MSW, MBA)  Occupational History   Occupation: retired---Financial Analyst  Tobacco Use   Smoking status: Former    Current packs/day: 0.00    Average packs/day: 2.0 packs/day for 26.0 years (52.0 ttl pk-yrs)    Types: Cigarettes    Start date: 07/29/1956    Quit date: 07/29/1982    Years since quitting: 41.8   Smokeless tobacco: Never   Tobacco comments:    ages [age], up to 2 ppd  Vaping Use   Vaping status: Never Used  Substance and Sexual Activity   Alcohol use: No    Comment: quit 1997   Drug use: No   Sexual activity: Not on file  Other Topics Concern   Not on file  Social History Narrative   Household: pt, wife, child   Social Drivers of Health   Financial Resource Strain: Patient Declined (05/20/2024)   Overall Financial Resource Strain (CARDIA)    Difficulty of Paying Living  Expenses: Patient declined  Food Insecurity: Patient Declined (05/20/2024)   Hunger Vital Sign    Worried About Running Out of Food in the Last Year: Patient declined    Ran Out of Food in the Last Year: Patient declined  Transportation Needs: Patient Declined (05/20/2024)   PRAPARE - Administrator, Civil Service (Medical): Patient declined    Lack of Transportation (Non-Medical): Patient declined  Physical Activity: Sufficiently Active (05/20/2024)   Exercise Vital Sign    Days of Exercise per Week: 6 days    Minutes of Exercise per Session: 60 min  Stress: Patient Declined (05/20/2024)   [employer] of Occupational Health - Occupational Stress Questionnaire    Feeling of Stress: Patient declined  Social Connections: Unknown (05/20/2024)   Social Connection and Isolation Panel    Frequency of Communication with Friends and Family: Patient declined    Frequency of Social Gatherings with Friends and Family: Patient declined    Attends Religious Services: Patient declined    Database Administrator or Organizations: Patient declined    Attends Banker Meetings: Not on file    Marital Status: Married  Intimate Partner Violence: Not At Risk (12/24/2023)   Humiliation, Afraid, Rape, and Kick questionnaire    Fear of Current or Ex-Partner: No    Emotionally Abused: No    Physically Abused: No    Sexually Abused: No    Past Surgical History:  Procedure Laterality Date   basal cell cancer     X 3   EYE SURGERY     Cataract   INGUINAL HERNIA REPAIR  [age]   right,left   OPEN REDUCTION INTERNAL FIXATION (ORIF) METACARPAL Right 05/19/2013   Procedure: OPEN REDUCTION INTERNAL FIXATION  RIGHT SMALL  METACARPAL FRACTURE;  Surgeon: Lamar LULLA Leonor Mickey., MD;  Location: [facility];  Service: Orthopedics;  Laterality: Right;   POLYPECTOMY  X 3; Dr Luis   WISDOM TOOTH EXTRACTION      Family History  Problem Relation Age of Onset   Stroke  Mother 5   Hypertension Mother    Alcohol abuse Mother    Obesity Mother    Diabetes Father    Kidney disease Father    Heart disease Neg Hx    Cancer Neg Hx     Allergies  Allergen Reactions   Penicillins Hives    hives   Ramipril  Anaphylaxis and Swelling    Current Outpatient Medications on File Prior to Visit  Medication Sig Dispense Refill   aspirin  EC 81 MG tablet Take 1 tablet (81 mg total) by mouth daily. Swallow whole. 90 tablet 3   atorvastatin  (LIPITOR) 10 MG tablet Take 1 tablet (10 mg total) by mouth at bedtime. 90 tablet 1   b complex vitamins capsule Take 1 capsule by mouth daily.     cetirizine (ZYRTEC) 10 MG tablet Take 10 mg by mouth daily.     cholecalciferol (VITAMIN D3) 25 MCG (1000 UNIT) tablet Take 1,000 Units by mouth daily.     ELIQUIS  5 MG TABS tablet TAKE 1 TABLET BY MOUTH TWICE A DAY 180 tablet 1   hydrochlorothiazide  (HYDRODIURIL ) 12.5 MG tablet Take 1 tablet (12.5 mg total) by mouth daily. 90 tablet 1   losartan  (COZAAR ) 25 MG tablet TAKE 1 TABLET (25 MG TOTAL) BY MOUTH DAILY. 90 tablet 3   metoprolol  tartrate (LOPRESSOR ) 50 MG tablet Take 12.5 mg by mouth as needed for heart rate control (fast heart rate).     Multiple Vitamin (MULTIVITAMIN WITH MINERALS) TABS tablet Take 1 tablet by mouth daily.     Multiple Vitamins-Minerals (ICAPS AREDS 2 PO) Take 1 tablet by mouth daily in the afternoon.     tirzepatide (ZEPBOUND) 2.5 MG/0.5ML Pen Inject 2.5 mg into the skin once a week. Prescribed by [facility]     clonazePAM  (KLONOPIN ) 0.5 MG tablet TAKE 1 TABLET BY MOUTH EVERY NIGHT AT BEDTIME AS NEEDED FOR RESTLESS LEG (Patient not taking: Reported on 05/26/2024) 90 tablet 1   No current facility-administered medications on file prior to visit.    BP 119/68   Pulse 63   Temp 97.8 F (36.6 C) (Oral)   Resp 14   Ht 5' 8.5 (1.74 m)   Wt 182 lb 12.8 oz (82.9 kg)   SpO2 97%   BMI 27.39 kg/m          Objective:   Physical Exam  General Mental  Status- Alert. General Appearance- Not in acute distress.   Skin General: Color- Normal Color. Moisture- Normal Moisture.  Neck Carotid Arteries- Normal color. Moisture- Normal Moisture. No carotid bruits. No JVD.  Chest and Lung Exam Auscultation: Breath Sounds:-Normal.  Cardiovascular Auscultation:Rythm- Regular. Murmurs & Other Heart Sounds:Auscultation of the heart reveals- No Murmurs.  Abdomen Inspection:-Inspeection Normal. Palpation/Percussion:Note:No mass. Palpation and Percussion of the abdomen reveal- Non Tender, Non Distended + BS, no rebound or guarding.   Neurologic Cranial Nerve exam:- CN III-XII intact(No nystagmus), symmetric smile. Strength:- 5/5 equal and symmetric strength both upper and lower extremities.    Genital area- left side canal. No obvious hernia on exam but on lying supine and lifting legs slight bulge in upper canal portion. On digit exam. Small possible hernia at top of canal bulges briefly on coughing then resolves.  No testicle pain on palpation.     Assessment & Plan:   Possible left inguinal hernia early high  in the canal without obstruction or gangrene Small, early hernia at the top of the inguinal canal. Spontaneously reduces. Surgical intervention not immediately recommended due to mild nature and anesthesia risks. - Refer to general surgeon for non-emergent evaluation. - Advise monitoring for changes in size, pain, or symptoms such as nausea, vomiting, or fever. - Instruct to seek emergency care if severe pain or persistent bulge occurs.   Follow up as regularly scheduled with pcp or sooner if needed   Eymi Lipuma, PA-C

## 2024-05-26 NOTE — Patient Instructions (Signed)
 Possible left inguinal hernia early high in the canal without obstruction or gangrene Small, early hernia at the top of the inguinal canal. Spontaneously reduces. Surgical intervention not immediately recommended due to mild nature and anesthesia risks. - Refer to general surgeon for non-emergent evaluation. - Advise monitoring for changes in size, pain, or symptoms such as nausea, vomiting, or fever. - Instruct to seek emergency care if severe pain or persistent bulge occurs.   Follow up as regularly scheduled with pcp or sooner if needed

## 2024-06-01 ENCOUNTER — Ambulatory Visit: Admitting: Psychology

## 2024-06-01 DIAGNOSIS — F4322 Adjustment disorder with anxiety: Secondary | ICD-10-CM

## 2024-06-01 NOTE — Progress Notes (Signed)
 [facility] Counselor/Therapist Progress Note  Patient ID: Gregory Wilkins, MRN: [id number],    Date: 06/01/2024  Time Spent: 9:00am-9:50am  50 minutes   Treatment Type: Individual Therapy  Reported Symptoms: stress  Mental Status Exam: Appearance:  Casual     Behavior: Appropriate  Motor: Normal  Speech/Language:  Normal Rate  Affect: Appropriate  Mood: normal  Thought process: normal  Thought content:   WNL  Sensory/Perceptual disturbances:   WNL  Orientation: oriented to person, place, time/date, and situation  Attention: Good  Concentration: Good  Memory: WNL  Fund of knowledge:  Good  Insight:   Good  Judgment:  Good  Impulse Control: Good   Risk Assessment: Danger to Self:  No Self-injurious Behavior: No Danger to Others: No Duty to Warn:no Physical Aggression / Violence:No  Access to Firearms a concern: No  Gang Involvement:No   Subjective: Pt present for face-to-face individual therapy via video.  Pt consents to telehealth video session and is aware of limitations and benefits of virtual sessions.  Location of pt: home Location of therapist: home office.   Pt talked about thinking about the past.  He thinks about the past and has regrets at times.   He is working on releasing the regrets and mistakes he made.  Pt has tended to beat himself up over the mistakes he has made. Addressed pt's regrets and worked on electronics engineer. Pt talked about having a tendency to over do it with any habit he has had.  He use to drink too much and smoke too much but he quit those bad habits years ago and now exercises too much.  He exercises a couple of times every day and it is good for him for the most part.   Pt talked about his son Gregory Wilkins.  He is seeing a psychiatrist and has an appointment with a therapist.  Pt is relieved but tries to not get overly hopeful.   Worked on self care strategies.  Pt does a lot of yoga and walking.  Provided supportive  therapy.  Interventions: Cognitive Behavioral Therapy and Insight-Oriented  Diagnosis:  F43.22  Plan of Care: Pt participated in setting treatment goals.   He is progressing toward goals.  Pt  Plan to continue to meet every two weeks. Pt agrees with treatment plan.  Treatment Plan  (Treatment Plan Target Date 11/17/2024) Client Abilities/Strengths  Pt is bright, engaging and motivated for therapy.  Client Treatment Preferences  Individual therapy.  Client Statement of Needs  Improve coping skills.  Symptoms  Autonomic hyperactivity (e.g., palpitations, shortness of breath, dry mouth, trouble swallowing, nausea, diarrhea). Excessive and/or unrealistic worry that is difficult to control occurring more days than not for at least 6 months about a number of events or activities. Hypervigilance (e.g., feeling constantly on edge, experiencing concentration difficulties, having trouble falling or staying asleep, exhibiting a general state of irritability). Motor tension (e.g., restlessness, tiredness, shakiness, muscle tension). Problems Addressed  Anxiety Goals 1. Enhance ability to effectively cope with the full variety of life's worries and anxieties. 2. Learn and implement coping skills that result in a reduction of anxiety and worry, and improved daily functioning. Objective Learn to accept limitations in life and commit to tolerating, rather than avoiding, unpleasant emotions while accomplishing meaningful goals. Target Date: 2024-11-17 Frequency: Biweekly Progress: 65 Modality: individual Related Interventions 1. Use techniques from Acceptance and Commitment Therapy to help client accept uncomfortable realities such as lack of complete control, imperfections, and uncertainty and  tolerate unpleasant emotions and thoughts in order to accomplish value-consistent goals. Objective Learn and implement problem-solving strategies for realistically addressing worries. Target Date:  2024-11-17 Frequency: Biweekly Progress: 65 Modality: individual Related Interventions 1. Assign the client a homework exercise in which he/she problem-solves a current problem.  review, reinforce success, and provide corrective feedback toward improvement. 2. Teach the client problem-solving strategies involving specifically defining a problem, generating options for addressing it, evaluating the pros and cons of each option, selecting and implementing an optional action, and reevaluating and refining the action. Objective Learn and implement calming skills to reduce overall anxiety and manage anxiety symptoms. Target Date: 2024-11-17 Frequency: Biweekly Progress: 65 Modality: individual Related Interventions 1. Assign the client to read about progressive muscle relaxation and other calming strategies in relevant books or treatment manuals (e.g., Progressive Relaxation Training by Thornell and Elmer; Mastery of Your Anxiety and Worry: Workbook by Richarda armin Given). 2. Assign the client homework each session in which he/she practices relaxation exercises daily, gradually applying them progressively from non-anxiety-provoking to anxiety-provoking situations; review and reinforce success while providing corrective feedback toward improvement. 3. Teach the client calming/relaxation skills (e.g., applied relaxation, progressive muscle relaxation, cue controlled relaxation; mindful breathing; biofeedback) and how to discriminate better between relaxation and tension; teach the client how to apply these skills to his/her daily life. 3. Reduce overall frequency, intensity, and duration of the anxiety so that daily functioning is not impaired. 4. Resolve the core conflict that is the source of anxiety. 5. Stabilize anxiety level while increasing ability to function on a daily basis. Diagnosis :    F43.22 Conditions For Discharge Achievement of treatment goals and objectives.   Vonda Harth,  LCSW

## 2024-06-15 ENCOUNTER — Ambulatory Visit: Admitting: Psychology

## 2024-06-15 DIAGNOSIS — F4322 Adjustment disorder with anxiety: Secondary | ICD-10-CM | POA: Diagnosis not present

## 2024-06-15 NOTE — Progress Notes (Signed)
 [facility] Counselor/Therapist Progress Note  Patient ID: Gregory Wilkins, MRN: [id number],    Date: 06/15/2024  Time Spent: 9:00am-9:50am  50 minutes   Treatment Type: Individual Therapy  Reported Symptoms: stress  Mental Status Exam: Appearance:  Casual     Behavior: Appropriate  Motor: Normal  Speech/Language:  Normal Rate  Affect: Appropriate  Mood: normal  Thought process: normal  Thought content:   WNL  Sensory/Perceptual disturbances:   WNL  Orientation: oriented to person, place, time/date, and situation  Attention: Good  Concentration: Good  Memory: WNL  Fund of knowledge:  Good  Insight:   Good  Judgment:  Good  Impulse Control: Good   Risk Assessment: Danger to Self:  No Self-injurious Behavior: No Danger to Others: No Duty to Warn:no Physical Aggression / Violence:No  Access to Firearms a concern: No  Gang Involvement:No   Subjective: Pt present for face-to-face individual therapy via video.  Pt consents to telehealth video session and is aware of limitations and benefits of virtual sessions.  Location of pt: home Location of therapist: home office.   Pt talked about his relationships.  His wife often tells him that people don't understand him.  Pt states he refuses to talk down to people so many times his attempts to connect with people fall flat.  Addressed how this impacts pt.   Pt talked about having a hard time letting go of things.   He has a hard time letting go of regrets.   Worked with pt on the process of letting go of the things that do not serve him well.   Pt talked about the holidays.  He has to attend a big Thanksgiving gathering at his nephew's house.  Pt dreads going.  Helped pt process his feelings and family dynamics.  Worked on self care strategies.  Pt does a lot of yoga and walking.  Provided supportive therapy.  Interventions: Cognitive Behavioral Therapy and Insight-Oriented  Diagnosis:  F43.22  Plan of Care:  Pt participated in setting treatment goals.   He is progressing toward goals.  Pt  Plan to continue to meet every two weeks. Pt agrees with treatment plan.  Treatment Plan  (Treatment Plan Target Date 11/17/2024) Client Abilities/Strengths  Pt is bright, engaging and motivated for therapy.  Client Treatment Preferences  Individual therapy.  Client Statement of Needs  Improve coping skills.  Symptoms  Autonomic hyperactivity (e.g., palpitations, shortness of breath, dry mouth, trouble swallowing, nausea, diarrhea). Excessive and/or unrealistic worry that is difficult to control occurring more days than not for at least 6 months about a number of events or activities. Hypervigilance (e.g., feeling constantly on edge, experiencing concentration difficulties, having trouble falling or staying asleep, exhibiting a general state of irritability). Motor tension (e.g., restlessness, tiredness, shakiness, muscle tension). Problems Addressed  Anxiety Goals 1. Enhance ability to effectively cope with the full variety of life's worries and anxieties. 2. Learn and implement coping skills that result in a reduction of anxiety and worry, and improved daily functioning. Objective Learn to accept limitations in life and commit to tolerating, rather than avoiding, unpleasant emotions while accomplishing meaningful goals. Target Date: 2024-11-17 Frequency: Biweekly Progress: 65 Modality: individual Related Interventions 1. Use techniques from Acceptance and Commitment Therapy to help client accept uncomfortable realities such as lack of complete control, imperfections, and uncertainty and tolerate unpleasant emotions and thoughts in order to accomplish value-consistent goals. Objective Learn and implement problem-solving strategies for realistically addressing worries. Target Date: 2024-11-17 Frequency:  Biweekly Progress: 65 Modality: individual Related Interventions 1. Assign the client a homework exercise  in which he/she problem-solves a current problem.  review, reinforce success, and provide corrective feedback toward improvement. 2. Teach the client problem-solving strategies involving specifically defining a problem, generating options for addressing it, evaluating the pros and cons of each option, selecting and implementing an optional action, and reevaluating and refining the action. Objective Learn and implement calming skills to reduce overall anxiety and manage anxiety symptoms. Target Date: 2024-11-17 Frequency: Biweekly Progress: 65 Modality: individual Related Interventions 1. Assign the client to read about progressive muscle relaxation and other calming strategies in relevant books or treatment manuals (e.g., Progressive Relaxation Training by Thornell and Elmer; Mastery of Your Anxiety and Worry: Workbook by Richarda armin Given). 2. Assign the client homework each session in which he/she practices relaxation exercises daily, gradually applying them progressively from non-anxiety-provoking to anxiety-provoking situations; review and reinforce success while providing corrective feedback toward improvement. 3. Teach the client calming/relaxation skills (e.g., applied relaxation, progressive muscle relaxation, cue controlled relaxation; mindful breathing; biofeedback) and how to discriminate better between relaxation and tension; teach the client how to apply these skills to his/her daily life. 3. Reduce overall frequency, intensity, and duration of the anxiety so that daily functioning is not impaired. 4. Resolve the core conflict that is the source of anxiety. 5. Stabilize anxiety level while increasing ability to function on a daily basis. Diagnosis :    F43.22 Conditions For Discharge Achievement of treatment goals and objectives.   Dezi Schaner, LCSW

## 2024-06-29 ENCOUNTER — Ambulatory Visit: Admitting: Psychology

## 2024-06-29 DIAGNOSIS — F4322 Adjustment disorder with anxiety: Secondary | ICD-10-CM

## 2024-06-29 NOTE — Progress Notes (Signed)
 [facility] Counselor/Therapist Progress Note  Patient ID: Gregory Wilkins, MRN: [id number],    Date: 06/29/2024  Time Spent: 9:00am-9:50am  50 minutes   Treatment Type: Individual Therapy  Reported Symptoms: stress  Mental Status Exam: Appearance:  Casual     Behavior: Appropriate  Motor: Normal  Speech/Language:  Normal Rate  Affect: Appropriate  Mood: normal  Thought process: normal  Thought content:   WNL  Sensory/Perceptual disturbances:   WNL  Orientation: oriented to person, place, time/date, and situation  Attention: Good  Concentration: Good  Memory: WNL  Fund of knowledge:  Good  Insight:   Good  Judgment:  Good  Impulse Control: Good   Risk Assessment: Danger to Self:  No Self-injurious Behavior: No Danger to Others: No Duty to Warn:no Physical Aggression / Violence:No  Access to Firearms a concern: No  Gang Involvement:No   Subjective: Pt present for face-to-face individual therapy via video.  Pt consents to telehealth video session and is aware of limitations and benefits of virtual sessions.  Location of pt: home Location of therapist: home office.   Pt talked about Thanksgiving.  He attended a family event and he got through it ok but did not enjoy it.  Helped pt process his feelings and family dynamics.  Pt states his son Karry is disrespectful to pt's wife and they tend to have conflict often.  Addressed the interactions and how they impact the family and pt.   Pt gets frustrated with the chronic issues.   At times pt's son orlander norwood thoughts of self harm but he has never acted on it.  Gregory Wilkins is seeing a psychiatrist and therapist now.   Pt talked about having a hard time letting go of things.   Worked with pt on the process of letting go of the things that do not serve him well.   Pt talked about Christmas plans.  He will spend the day with his sister at [place].  He thinks that should be a stress free day.  Worked on self care  strategies.  Pt does a lot of yoga and walking.  Provided supportive therapy.  Interventions: Cognitive Behavioral Therapy and Insight-Oriented  Diagnosis:  F43.22  Plan of Care: Pt participated in setting treatment goals.   He is progressing toward goals.  Pt  Plan to continue to meet every two weeks. Pt agrees with treatment plan.  Treatment Plan  (Treatment Plan Target Date 11/17/2024) Client Abilities/Strengths  Pt is bright, engaging and motivated for therapy.  Client Treatment Preferences  Individual therapy.  Client Statement of Needs  Improve coping skills.  Symptoms  Autonomic hyperactivity (e.g., palpitations, shortness of breath, dry mouth, trouble swallowing, nausea, diarrhea). Excessive and/or unrealistic worry that is difficult to control occurring more days than not for at least 6 months about a number of events or activities. Hypervigilance (e.g., feeling constantly on edge, experiencing concentration difficulties, having trouble falling or staying asleep, exhibiting a general state of irritability). Motor tension (e.g., restlessness, tiredness, shakiness, muscle tension). Problems Addressed  Anxiety Goals 1. Enhance ability to effectively cope with the full variety of life's worries and anxieties. 2. Learn and implement coping skills that result in a reduction of anxiety and worry, and improved daily functioning. Objective Learn to accept limitations in life and commit to tolerating, rather than avoiding, unpleasant emotions while accomplishing meaningful goals. Target Date: 2024-11-17 Frequency: Biweekly Progress: 65 Modality: individual Related Interventions 1. Use techniques from Acceptance and Commitment Therapy to help client  accept uncomfortable realities such as lack of complete control, imperfections, and uncertainty and tolerate unpleasant emotions and thoughts in order to accomplish value-consistent goals. Objective Learn and implement problem-solving  strategies for realistically addressing worries. Target Date: 2024-11-17 Frequency: Biweekly Progress: 65 Modality: individual Related Interventions 1. Assign the client a homework exercise in which he/she problem-solves a current problem.  review, reinforce success, and provide corrective feedback toward improvement. 2. Teach the client problem-solving strategies involving specifically defining a problem, generating options for addressing it, evaluating the pros and cons of each option, selecting and implementing an optional action, and reevaluating and refining the action. Objective Learn and implement calming skills to reduce overall anxiety and manage anxiety symptoms. Target Date: 2024-11-17 Frequency: Biweekly Progress: 65 Modality: individual Related Interventions 1. Assign the client to read about progressive muscle relaxation and other calming strategies in relevant books or treatment manuals (e.g., Progressive Relaxation Training by Thornell and Elmer; Mastery of Your Anxiety and Worry: Workbook by Richarda armin Given). 2. Assign the client homework each session in which he/she practices relaxation exercises daily, gradually applying them progressively from non-anxiety-provoking to anxiety-provoking situations; review and reinforce success while providing corrective feedback toward improvement. 3. Teach the client calming/relaxation skills (e.g., applied relaxation, progressive muscle relaxation, cue controlled relaxation; mindful breathing; biofeedback) and how to discriminate better between relaxation and tension; teach the client how to apply these skills to his/her daily life. 3. Reduce overall frequency, intensity, and duration of the anxiety so that daily functioning is not impaired. 4. Resolve the core conflict that is the source of anxiety. 5. Stabilize anxiety level while increasing ability to function on a daily basis. Diagnosis :    F43.22 Conditions For  Discharge Achievement of treatment goals and objectives.   Gregory Roddy, LCSW

## 2024-06-30 ENCOUNTER — Telehealth: Payer: Self-pay

## 2024-06-30 NOTE — Telephone Encounter (Signed)
 Pt is scheduled to see Gregory Ferrari, NP tomorrow for cpap f/u. I could not find pt in [facility]. I called and spoke to [facility] with [facility]. Arvella stated that pt has a Luna and he will reach out to the [place] office to receive a compliance report and will fax it over to me. I gave Brad b pod fax number. Will await for fax.

## 2024-07-01 ENCOUNTER — Ambulatory Visit: Admitting: Primary Care

## 2024-07-01 ENCOUNTER — Telehealth: Admitting: Primary Care

## 2024-07-01 DIAGNOSIS — J439 Emphysema, unspecified: Secondary | ICD-10-CM

## 2024-07-01 DIAGNOSIS — Z87891 Personal history of nicotine dependence: Secondary | ICD-10-CM | POA: Diagnosis not present

## 2024-07-01 DIAGNOSIS — G4733 Obstructive sleep apnea (adult) (pediatric): Secondary | ICD-10-CM

## 2024-07-01 NOTE — Patient Instructions (Signed)
  VISIT SUMMARY: You came in today for a follow-up visit regarding your severe sleep apnea. You have been using your CPAP machine consistently and have experienced significant improvement in your condition. You have also lost weight, which may be contributing to better control of your sleep apnea. Additionally, your mild emphysema remains stable with no current respiratory symptoms.  YOUR PLAN: -OBSTRUCTIVE SLEEP APNEA: Obstructive sleep apnea is a condition where your airway becomes blocked during sleep, causing breathing pauses. You have been using your CPAP machine at a pressure of 9 cm H2O, which has helped reduce your apnea events to an average of 5.6 per hour. Continue using your CPAP machine as directed, and your supplies have been renewed. We will consider replacing your CPAP machine in 2026.  -EMPHYSEMA: Emphysema is a lung condition that causes shortness of breath. Although you have mild emphysema, you are not experiencing any respiratory symptoms. Continue your current lifestyle activities, including yoga and martial arts, which help maintain your respiratory health.  INSTRUCTIONS: Please continue using your CPAP machine at the current pressure of 9 cm H2O. Your CPAP supplies have been renewed, and we will plan for a potential replacement of your CPAP machine in 2026. Keep up with your yoga and martial arts activities to support your respiratory health. No additional follow-up is needed at this time unless you experience any new symptoms.

## 2024-07-01 NOTE — Progress Notes (Signed)
 Virtual Visit via Video Note  I connected with Gregory Wilkins on 07/01/24 at  9:00 AM EST by a video enabled telemedicine application and verified that I am speaking with the correct person using two identifiers.  Location: Patient: Home Provider: Office   I discussed the limitations of evaluation and management by telemedicine and the availability of in person appointments. The patient expressed understanding and agreed to proceed.  History of Present Illness:  83 year old male, former smoker quit 1984 (52-pack-year history).  Past medical history significant for obstructive sleep apnea, nocturnal hypoxia, allergic rhinitis, hypertension, a flutter, mixed dyslipidemia, restless leg syndrome, skin cancer, obesity.  Patient of Dr. Jude, seen for initial consult on 11/04/2019.    Home sleep study 04/03/20 showed severe obstructive sleep apnea, AHI 57/hr.  CPAP titration completed 05/17/20, recommended pressure 9 cm H20 with no oxygen requirement.   Previous [facility] pulmonary encounter:  04/09/2023 Hx severe OSA, AHI 57/hour  Patient is doing well He is compliant with CPAP use  Current pressure 9cm h20; Residual AHI 4.3  He is getting between 5-6 hours of sleep a night He may take a nap after dinner He will at times wake up in the middle of the night, he has a very active mind which will occasionally keep him up  He takes clonazepam  on average twice a month as needed for RLS He has seasonal allergies. Triggers are cats, dust and pollen  Maintained on Cetirizine 10mg  daily, ocean saline nasal spray twice daily and prn mucinex   Reactive health compliance report 03/10/2023 - 04/08/2023 Usage days 28/30 days (93%); 24 days (80%) greater than 4 hours Average usage 5 hours 11 minutes Pressure 9 cm H2O (ramp/initial pressure 4 cm H2O) Average AHI 4.3  OSA on CPAP - Hx severe OSA, AHI 57/hour. Maintained on CPAP. He is 93% compliant with use over the last 30 days.  Average usage 5 hours 11  minutes.  Current pressure 9 cm H2O; Residual AHI 4.3/hour.  No changes today.  Advised patient continue to wear CPAP nightly.  Encouraged side sleeping position and weight loss efforts.  Follow-up in 1 year or sooner if needed.  Nocturnal hypoxia CPAP titration completed 05/17/20, recommended pressure 9 cm H20 with no oxygen requirement.   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  No indication for maintenance bronchodilators.  Allergic rhinitis - Stable;Continue Cetirizine 10mg  daily, ocean saline nasal spray twice daily and prn mucinex   07/01/2024- Interim hx Discussed the use of AI scribe software for clinical note transcription with the patient, who gave verbal consent to proceed.  History of Present Illness Gregory Wilkins is an 83 year old male with severe sleep apnea who presents for a follow-up visit.  He has a history of severe sleep apnea, diagnosed in 2021 with a sleep study showing an average of 57 apneic events per hour. A subsequent titration study determined a CPAP pressure of 9 cm H2O was effective, and he does not require supplemental oxygen. He has been 100% compliant with CPAP use over the past 30 days, averaging 6 hours and 25 minutes of use per night. His current apnea-hypopnea index (AHI) is 5.6. He experiences occasional air leaks when getting up at night but otherwise reports no significant problems with the CPAP.  He has lost weight to 176.7 pounds, down from 230 pounds at the time of his initial sleep study. His goal weight is 180 pounds.  He has a history of mild emphysema noted on a CT scan but reports no respiratory symptoms and is not using an inhaler. He engages in yoga and martial arts, which include breath work and diaphragmatic breathing, and believes these activities help maintain his respiratory health.  His current medications include aspirin , Lipitor, a B vitamin complex,  cetirizine, vitamin D , Eliquis  5 mg twice daily, hydrochlorothiazide  12.5 mg, losartan  25 mg, and Zepbound 5 mg. He takes Klonopin  and metoprolol  as needed, though he reports rarely using them.      Observations/Objective:  Appears well without overt respiratory symptoms   Assessment and Plan:  Assessment and Plan Assessment & Plan Obstructive sleep apnea on CPAP therapy Severe obstructive sleep apnea with an average of 57 apneic events per hour in 2021. Current CPAP therapy at 9 cm H2O pressure shows compliance with 100% usage over the last 30 days, averaging 6 hours and 25 minutes per night. Average apnea score is 5.6, improved from previous levels. Weight loss from 230 lbs to 176.7 lbs may contribute to improved apnea control. No significant air leaks reported. CPAP machine functioning well, with potential for replacement in 2026. - Continue CPAP therapy at current pressure of 9 cm H2O. - Renewed CPAP supplies through medical supply store. - Plan for potential CPAP machine replacement in 2026.  Emphysema Mild emphysema noted on previous CT scan. No current respiratory symptoms reported. Not on maintenance bronchodilator regimen. Engages in yoga and martial arts with diaphragmatic breathing exercises, which may aid in respiratory function. - Continue current lifestyle activities including yoga and martial arts.   Follow Up Instructions:  1 year follow-up    I discussed the assessment and treatment plan with the patient. The patient was provided an opportunity to ask questions and all were answered. The patient agreed with the plan and demonstrated an understanding of the instructions.   The patient was advised to call back or seek an in-person evaluation if the symptoms worsen or if the condition fails to improve as anticipated.  I provided 22 minutes of non-face-to-face time during this encounter.   Almarie LELON Ferrari, NP

## 2024-07-13 ENCOUNTER — Ambulatory Visit: Admitting: Psychology

## 2024-07-13 DIAGNOSIS — F4322 Adjustment disorder with anxiety: Secondary | ICD-10-CM

## 2024-07-13 NOTE — Progress Notes (Signed)
 [facility] Counselor/Therapist Progress Note  Patient ID: Gregory Wilkins, MRN: [id number],    Date: 07/13/2024  Time Spent: 9:00am-9:50am  50 minutes   Treatment Type: Individual Therapy  Reported Symptoms: stress  Mental Status Exam: Appearance:  Casual     Behavior: Appropriate  Motor: Normal  Speech/Language:  Normal Rate  Affect: Appropriate  Mood: normal  Thought process: normal  Thought content:   WNL  Sensory/Perceptual disturbances:   WNL  Orientation: oriented to person, place, time/date, and situation  Attention: Good  Concentration: Good  Memory: WNL  Fund of knowledge:  Good  Insight:   Good  Judgment:  Good  Impulse Control: Good   Risk Assessment: Danger to Self:  No Self-injurious Behavior: No Danger to Others: No Duty to Warn:no Physical Aggression / Violence:No  Access to Firearms a concern: No  Gang Involvement:No   Subjective: Pt present for face-to-face individual therapy via video.  Pt consents to telehealth video session and is aware of limitations and benefits of virtual sessions.  Location of pt: home Location of therapist: home office.   Pt talked about feeling down bc of the holidays.  Pt states Christmas is usually a disaster bc of the issues with his son Magnum and his wife [name].   Addressed the family dynamics.   Pt states that Jousha has a low self worth and pt does too.   Pt states at times he does not feel like he deserves to be happy.   Pt talked about having a hard time letting go of things.   Worked with pt on the process of letting go of the things that do not serve him well.   Worked on self care strategies.  Pt does a lot of yoga and walking.  Provided supportive therapy.  Interventions: Cognitive Behavioral Therapy and Insight-Oriented  Diagnosis:  F43.22  Plan of Care: Pt participated in setting treatment goals.   He is progressing toward goals.  Pt  Plan to continue to meet every two weeks. Pt agrees with  treatment plan.  Treatment Plan  (Treatment Plan Target Date 11/17/2024) Client Abilities/Strengths  Pt is bright, engaging and motivated for therapy.  Client Treatment Preferences  Individual therapy.  Client Statement of Needs  Improve coping skills.  Symptoms  Autonomic hyperactivity (e.g., palpitations, shortness of breath, dry mouth, trouble swallowing, nausea, diarrhea). Excessive and/or unrealistic worry that is difficult to control occurring more days than not for at least 6 months about a number of events or activities. Hypervigilance (e.g., feeling constantly on edge, experiencing concentration difficulties, having trouble falling or staying asleep, exhibiting a general state of irritability). Motor tension (e.g., restlessness, tiredness, shakiness, muscle tension). Problems Addressed  Anxiety Goals 1. Enhance ability to effectively cope with the full variety of life's worries and anxieties. 2. Learn and implement coping skills that result in a reduction of anxiety and worry, and improved daily functioning. Objective Learn to accept limitations in life and commit to tolerating, rather than avoiding, unpleasant emotions while accomplishing meaningful goals. Target Date: 2024-11-17 Frequency: Biweekly Progress: 65 Modality: individual Related Interventions 1. Use techniques from Acceptance and Commitment Therapy to help client accept uncomfortable realities such as lack of complete control, imperfections, and uncertainty and tolerate unpleasant emotions and thoughts in order to accomplish value-consistent goals. Objective Learn and implement problem-solving strategies for realistically addressing worries. Target Date: 2024-11-17 Frequency: Biweekly Progress: 65 Modality: individual Related Interventions 1. Assign the client a homework exercise in which he/she problem-solves a current  problem.  review, reinforce success, and provide corrective feedback toward improvement. 2. Teach  the client problem-solving strategies involving specifically defining a problem, generating options for addressing it, evaluating the pros and cons of each option, selecting and implementing an optional action, and reevaluating and refining the action. Objective Learn and implement calming skills to reduce overall anxiety and manage anxiety symptoms. Target Date: 2024-11-17 Frequency: Biweekly Progress: 65 Modality: individual Related Interventions 1. Assign the client to read about progressive muscle relaxation and other calming strategies in relevant books or treatment manuals (e.g., Progressive Relaxation Training by Thornell and Elmer; Mastery of Your Anxiety and Worry: Workbook by Richarda armin Given). 2. Assign the client homework each session in which he/she practices relaxation exercises daily, gradually applying them progressively from non-anxiety-provoking to anxiety-provoking situations; review and reinforce success while providing corrective feedback toward improvement. 3. Teach the client calming/relaxation skills (e.g., applied relaxation, progressive muscle relaxation, cue controlled relaxation; mindful breathing; biofeedback) and how to discriminate better between relaxation and tension; teach the client how to apply these skills to his/her daily life. 3. Reduce overall frequency, intensity, and duration of the anxiety so that daily functioning is not impaired. 4. Resolve the core conflict that is the source of anxiety. 5. Stabilize anxiety level while increasing ability to function on a daily basis. Diagnosis :    F43.22 Conditions For Discharge Achievement of treatment goals and objectives.   Geofrey Silliman, LCSW

## 2024-08-05 ENCOUNTER — Encounter: Payer: Self-pay | Admitting: Internal Medicine

## 2024-08-05 DIAGNOSIS — G2581 Restless legs syndrome: Secondary | ICD-10-CM

## 2024-08-08 MED ORDER — CLONAZEPAM 0.5 MG PO TABS: 0.5000 mg | ORAL_TABLET | Freq: Every evening | ORAL | 0 refills | Status: AC | PRN

## 2024-08-08 NOTE — Telephone Encounter (Signed)
 Requesting: clonazepam  0.5mg  Contract:10/14/22 UDS: 08/11/23 Last Visit: 02/12/24 Next Visit: 08/15/24 Last Refill: 07/14/22 #90 and 1RF   Please Advise

## 2024-08-08 NOTE — Telephone Encounter (Signed)
 PDMP okay, takes clonazepam  rarely, Rx sent

## 2024-08-09 ENCOUNTER — Ambulatory Visit: Admitting: Primary Care

## 2024-08-10 ENCOUNTER — Ambulatory Visit: Admitting: Psychology

## 2024-08-10 DIAGNOSIS — F4322 Adjustment disorder with anxiety: Secondary | ICD-10-CM

## 2024-08-10 NOTE — Progress Notes (Signed)
 "  [facility] Counselor/Therapist Progress Note  Patient ID: Gregory Wilkins, MRN: [id number],    Date: 08/10/2024  Time Spent: 9:00am-9:50am  50 minutes   Treatment Type: Individual Therapy  Reported Symptoms: stress  Mental Status Exam: Appearance:  Casual     Behavior: Appropriate  Motor: Normal  Speech/Language:  Normal Rate  Affect: Appropriate  Mood: normal  Thought process: normal  Thought content:   WNL  Sensory/Perceptual disturbances:   WNL  Orientation: oriented to person, place, time/date, and situation  Attention: Good  Concentration: Good  Memory: WNL  Fund of knowledge:  Good  Insight:   Good  Judgment:  Good  Impulse Control: Good   Risk Assessment: Danger to Self:  No Self-injurious Behavior: No Danger to Others: No Duty to Warn:no Physical Aggression / Violence:No  Access to Firearms a concern: No  Gang Involvement:No   Subjective: Pt present for face-to-face individual therapy via video.  Pt consents to telehealth video session and is aware of limitations and benefits of virtual sessions.  Location of pt: home Location of therapist: home office.   Pt talked about his family.  Pt feels frustrated with his son Gregory Wilkins and [name].  Pt states they are both acting like children.  Helped pt process his feelings and family dynamics.  Pt talked about having a hard time letting go of things.   Worked with pt on the process of letting go of the things that do not serve him well.   Worked on self care strategies.  Pt does a lot of yoga and walking.  Provided supportive therapy.  Interventions: Cognitive Behavioral Therapy and Insight-Oriented  Diagnosis:  F43.22  Plan of Care: Pt participated in setting treatment goals.   He is progressing toward goals.  Pt  Plan to continue to meet every two weeks. Pt agrees with treatment plan.  Treatment Plan  (Treatment Plan Target Date 11/17/2024) Client Abilities/Strengths  Pt is bright,  engaging and motivated for therapy.  Client Treatment Preferences  Individual therapy.  Client Statement of Needs  Improve coping skills.  Symptoms  Autonomic hyperactivity (e.g., palpitations, shortness of breath, dry mouth, trouble swallowing, nausea, diarrhea). Excessive and/or unrealistic worry that is difficult to control occurring more days than not for at least 6 months about a number of events or activities. Hypervigilance (e.g., feeling constantly on edge, experiencing concentration difficulties, having trouble falling or staying asleep, exhibiting a general state of irritability). Motor tension (e.g., restlessness, tiredness, shakiness, muscle tension). Problems Addressed  Anxiety Goals 1. Enhance ability to effectively cope with the full variety of life's worries and anxieties. 2. Learn and implement coping skills that result in a reduction of anxiety and worry, and improved daily functioning. Objective Learn to accept limitations in life and commit to tolerating, rather than avoiding, unpleasant emotions while accomplishing meaningful goals. Target Date: 2024-11-17 Frequency: Biweekly Progress: 65 Modality: individual Related Interventions 1. Use techniques from Acceptance and Commitment Therapy to help client accept uncomfortable realities such as lack of complete control, imperfections, and uncertainty and tolerate unpleasant emotions and thoughts in order to accomplish value-consistent goals. Objective Learn and implement problem-solving strategies for realistically addressing worries. Target Date: 2024-11-17 Frequency: Biweekly Progress: 65 Modality: individual Related Interventions 1. Assign the client a homework exercise in which he/she problem-solves a current problem.  review, reinforce success, and provide corrective feedback toward improvement. 2. Teach the client problem-solving strategies involving specifically defining a problem, generating options for addressing it,  evaluating the  pros and cons of each option, selecting and implementing an optional action, and reevaluating and refining the action. Objective Learn and implement calming skills to reduce overall anxiety and manage anxiety symptoms. Target Date: 2024-11-17 Frequency: Biweekly Progress: 65 Modality: individual Related Interventions 1. Assign the client to read about progressive muscle relaxation and other calming strategies in relevant books or treatment manuals (e.g., Progressive Relaxation Training by Thornell and Elmer; Mastery of Your Anxiety and Worry: Workbook by Richarda armin Given). 2. Assign the client homework each session in which he/she practices relaxation exercises daily, gradually applying them progressively from non-anxiety-provoking to anxiety-provoking situations; review and reinforce success while providing corrective feedback toward improvement. 3. Teach the client calming/relaxation skills (e.g., applied relaxation, progressive muscle relaxation, cue controlled relaxation; mindful breathing; biofeedback) and how to discriminate better between relaxation and tension; teach the client how to apply these skills to his/her daily life. 3. Reduce overall frequency, intensity, and duration of the anxiety so that daily functioning is not impaired. 4. Resolve the core conflict that is the source of anxiety. 5. Stabilize anxiety level while increasing ability to function on a daily basis. Diagnosis :    F43.22 Conditions For Discharge Achievement of treatment goals and objectives.   Mirely Pangle, LCSW    "

## 2024-08-14 ENCOUNTER — Other Ambulatory Visit: Payer: Self-pay | Admitting: Internal Medicine

## 2024-08-15 ENCOUNTER — Ambulatory Visit: Admitting: Internal Medicine

## 2024-08-15 ENCOUNTER — Encounter: Payer: Self-pay | Admitting: Internal Medicine

## 2024-08-15 VITALS — BP 122/62 | HR 60 | Temp 98.3°F | Resp 16 | Ht 68.5 in | Wt 175.4 lb

## 2024-08-15 DIAGNOSIS — E782 Mixed hyperlipidemia: Secondary | ICD-10-CM

## 2024-08-15 DIAGNOSIS — I10 Essential (primary) hypertension: Secondary | ICD-10-CM | POA: Diagnosis not present

## 2024-08-15 DIAGNOSIS — G2581 Restless legs syndrome: Secondary | ICD-10-CM | POA: Diagnosis not present

## 2024-08-15 DIAGNOSIS — I4891 Unspecified atrial fibrillation: Secondary | ICD-10-CM | POA: Diagnosis not present

## 2024-08-15 DIAGNOSIS — I4892 Unspecified atrial flutter: Secondary | ICD-10-CM

## 2024-08-15 DIAGNOSIS — Z79899 Other long term (current) drug therapy: Secondary | ICD-10-CM

## 2024-08-15 LAB — CBC WITH DIFFERENTIAL/PLATELET
Basophils Absolute: 0 K/uL (ref 0.0–0.1)
Basophils Relative: 1 % (ref 0.0–3.0)
Eosinophils Absolute: 0.3 K/uL (ref 0.0–0.7)
Eosinophils Relative: 5.9 % — ABNORMAL HIGH (ref 0.0–5.0)
HCT: 42.1 % (ref 39.0–52.0)
Hemoglobin: 14.3 g/dL (ref 13.0–17.0)
Lymphocytes Relative: 23 % (ref 12.0–46.0)
Lymphs Abs: 1.1 K/uL (ref 0.7–4.0)
MCHC: 34 g/dL (ref 30.0–36.0)
MCV: 91.3 fl (ref 78.0–100.0)
Monocytes Absolute: 0.7 K/uL (ref 0.1–1.0)
Monocytes Relative: 14.2 % — ABNORMAL HIGH (ref 3.0–12.0)
Neutro Abs: 2.6 K/uL (ref 1.4–7.7)
Neutrophils Relative %: 55.9 % (ref 43.0–77.0)
Platelets: 216 K/uL (ref 150.0–400.0)
RBC: 4.61 Mil/uL (ref 4.22–5.81)
RDW: 13.1 % (ref 11.5–15.5)
WBC: 4.7 K/uL (ref 4.0–10.5)

## 2024-08-15 LAB — LIPID PANEL
Cholesterol: 136 mg/dL (ref 28–200)
HDL: 73.5 mg/dL
LDL Cholesterol: 56 mg/dL (ref 10–99)
NonHDL: 62.78
Total CHOL/HDL Ratio: 2
Triglycerides: 35 mg/dL (ref 10.0–149.0)
VLDL: 7 mg/dL (ref 0.0–40.0)

## 2024-08-15 LAB — BASIC METABOLIC PANEL WITH GFR
BUN: 14 mg/dL (ref 6–23)
CO2: 32 meq/L (ref 19–32)
Calcium: 9.9 mg/dL (ref 8.4–10.5)
Chloride: 102 meq/L (ref 96–112)
Creatinine, Ser: 0.83 mg/dL (ref 0.40–1.50)
GFR: 82.05 mL/min
Glucose, Bld: 81 mg/dL (ref 70–99)
Potassium: 4.1 meq/L (ref 3.5–5.1)
Sodium: 139 meq/L (ref 135–145)

## 2024-08-15 NOTE — Progress Notes (Signed)
 "  Subjective:    Patient ID: Gregory Wilkins, male    DOB: March 30, 1941, 83 y.o.   MRN: [id number]  DOS:  08/15/2024 Follow-up   Discussed the use of AI scribe software for clinical note transcription with the patient, who gave verbal consent to proceed.  History of Present Illness Gregory Wilkins is an 84 year old male who presents for a routine follow-up visit.  Cardiovascular health - Takes aspirin , Eliquis , Lipitor, losartan , metoprolol , and hydrochlorothiazide  - Home blood pressures at the gym typically in the 120s systolic and 60s-70s diastolic, checked a few times per week - No chest pain or palpitations - Follows with cardiology annually  Physical activity and functional status - Performs daily moderate-intensity activity such as Tai Chi, yoga, biking, or walking for at least 45 minutes - Has been improving diet with associated weight loss    Wt Readings from Last 3 Encounters:  08/15/24 175 lb 6 oz (79.5 kg)  05/26/24 182 lb 12.8 oz (82.9 kg)  02/12/24 189 lb 8 oz (86 kg)    Review of Systems See above   Past Medical History:  Diagnosis Date   Acne rosacea 12/23/2006   Qualifier: Diagnosis of  By: Tish MD, Elsie   Dr Lorrayne Seashore   Formatting of this note might be different from the original. Overview:  Qualifier: Diagnosis of  By: Tish MD, Elsie   Dr Lorrayne Seashore Overview:  Overview:  Qualifier: Diagnosis of  By: Tish MD, Elsie   Dr Lorrayne Seashore  Overview:  Qualifier: Diagnosis of  By: Tish MD, Elsie   Dr Lorrayne Seashore Overview:  Overview:  Overview:   Acute meniscal tear, lateral 10/01/2021   Alcohol abuse    Allergic rhinitis 12/23/2006   Qualifier: Diagnosis of  By: Tish MD, Elsie   Onset:in high school Character: perennial but increased seasonally Triggers :cat, dust, pollen Allergy testing: Dr Amaryllis Finn Maintenance medications/ response:Flonase/ Nasonex; saline wash; Allegra Smoking history:age 76- 42, up to 2 ppd Family history pulmonary  disease: no    Formatting of this note might be different from the original. 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 Overview:  Qualifier: Diagnosis of  By: Tish MD, Elsie Deal of this note might be different from the original. Overview:  Qualifier: Diagnosis of  By: Tish MD, William  IMO 10/01 Updates   Enlarged prostate without lower urinary tract symptoms (luts) 11/09/2008   Formatting of this note might be different from the original. Overview:  Qualifier: Diagnosis of  By: Tish MD, Elsie   No PMH of elevated PSA ; no biopsy Overview:  Overview:  Qualifier: Diagnosis of  By: Tish MD, Elsie   No PMH of elevated PSA ; no biopsy Overview:  Overview:  Overview:  Qualifier: Diagnosis of  By: Tish MD, William   No PMH of elevated PSA ;  no biopsy   Essential (primary) hypertension 09/21/2007   Qualifier: Diagnosis of  By: Tish MD, Elsie Deal of this note might be different from the original. Overview:  Qualifier: Diagnosis of  By: Tish MD, Elsie   Last Assessment & Plan:  Blood pressure appears to be well controlled. Renal function will be checked.  He has a minor cough; this does not appear to be significant enough to warrant change to losartan  Overview:  Overview:  Qu   Family history of type C viral hepatitis 06/30/2012   Brother had liver transplant    Hepatitis A 04/2019   History of  colonic polyps 12/23/2006   Qualifier: Diagnosis of  By: Tish MD, Elsie   Dr Chyrl Plough, GI Polypectomy X2 , 4/16 /2014. One was  transitional cell adenoma Due annually No FH colon cancer   Formatting of this note might be different from the original. Overview:  Qualifier: Diagnosis of  By: Tish MD, Elsie   Dr Chyrl Plough, GI Polypectomy X2 , 4/16 /2014. One was  transitional cell adenoma Due annually No FH colon ca   History of urinary stone 09/21/2007   Qualifier: Diagnosis of  By: Tish MD, Elsie BLACKWOOD , passed spontaneously No Urologist involvement   Formatting of this note might be different from the original. Overview:  Qualifier: Diagnosis of  By: Tish MD, Elsie BLACKWOOD , passed spontaneously No Urologist involvement Overview:  Overview:  Qualifier: Diagnosis of  By: Tish MD, Elsie BLACKWOOD , passed spontaneously No Urologist involvement    HYPERLIPIDEMIA 09/21/2007   Qualifier: Diagnosis of  By: Tish MD, Elsie SINE Lipoprofile 2006 : LDL  108 ( 1072  / 575  ),  HDL 63 , Triglycerides  40. LDL goal = < 135 ; ideally < 105. No FH CAD   Formatting of this note might be different from the original. Overview:  Qualifier: Diagnosis of  By: Tish MD, Elsie SINE Lipoprofile 2006 : LDL  108 ( 1072  / 575  ),  HDL 63 , Triglycerides  40. LDL goal = < 135 ; idea   Hypertension    Joint pain    Liver injury, initial encounter 05/17/2019   Macular degeneration 01/06/2018   Malignant neoplasm of skin    Mallet finger 2012     4th L DIP,Dr Sypher   Nephrolithiasis    Nocturnal hypoxia 11/04/2019   Nuclear sclerotic cataract of both eyes    Obesity (BMI 30-39.9) 01/04/2014   OSA on CPAP 05/17/2020   Osteoarthritis    PCP NOTES >>>>>>>>>>>>>>>> 05/26/2019   Personal history of other malignant neoplasm of skin 06/30/2012   Dr Rolan Molt, Derm X3 Local resection from  right forearm and forehead.   Mohs surgery of the nose 06/2012, Dr Jadine Deal of this note might be different  from the original. Overview:  Dr Rolan Molt, Derm X3 Local resection from  right forearm and forehead.   Mohs surgery of the nose 06/2012, Dr Jadine Overview:  Overview:  Dr Rolan Molt, Derm X3 Local resection from  right forearm and fo   RLS (restless legs syndrome)    Sleep apnea     Past Surgical History:  Procedure Laterality Date   basal cell cancer     X 3   EYE SURGERY     Cataract   INGUINAL HERNIA REPAIR  [age]   right,left   OPEN REDUCTION INTERNAL  FIXATION (ORIF) METACARPAL Right 05/19/2013   Procedure: OPEN REDUCTION INTERNAL FIXATION  RIGHT SMALL  METACARPAL FRACTURE;  Surgeon: Lamar LULLA Leonor Mickey., MD;  Location: [facility];  Service: Orthopedics;  Laterality: Right;   POLYPECTOMY     X 3; Dr Luis   WISDOM TOOTH EXTRACTION      Current Outpatient Medications  Medication Instructions   aspirin  EC 81 mg, Oral, Daily, Swallow whole.   atorvastatin  (LIPITOR) 10 mg, Oral, Daily at bedtime   b complex vitamins capsule 1 capsule, Daily   cetirizine (ZYRTEC) 10 mg, Daily   cholecalciferol (VITAMIN D3) 1,000 Units, Daily   clonazePAM  (KLONOPIN ) 0.5 mg, Oral, At bedtime PRN   Eliquis  5 mg, Oral, 2 times daily   hydrochlorothiazide  (HYDRODIURIL ) 12.5 mg, Oral, Daily   losartan  (COZAAR ) 25 mg, Oral, Daily   metoprolol  tartrate (LOPRESSOR ) 12.5 mg, As needed   Multiple Vitamin (MULTIVITAMIN WITH MINERALS) TABS tablet 1 tablet, Daily   Multiple Vitamins-Minerals (ICAPS AREDS 2 PO) 1 tablet, Daily   Zepbound 2.5 mg, Weekly       Objective:   Physical Exam BP 122/62   Pulse 60   Temp 98.3 F (36.8 C) (Oral)   Resp 16   Ht 5' 8.5 (1.74 m)   Wt 175 lb 6 oz (79.5 kg)   SpO2 96%   BMI 26.28 kg/m  General:   Well developed, NAD, BMI noted. HEENT:  Normocephalic . Face symmetric, atraumatic Lungs:  CTA B Normal respiratory effort, no intercostal retractions, no accessory muscle use. Heart: RRR,  no murmur.  Lower extremities: no pretibial edema  bilaterally  Skin: Not pale. Not jaundice Neurologic:  alert & oriented X3.  Speech normal, gait appropriate for age and unassisted Psych--  Cognition and judgment appear intact.  Cooperative with normal attention span and concentration.  Behavior appropriate. No anxious or depressed appearing.      Assessment    Assessment (new patient 05/17/2019) HTN High Cholesterol Restless leg (clonazepam  prn) Parox. A. Flutter DX 02-2020 H/o kidney stone Chronic LE edema L>R, mild  Hepatitis a, acute 04-2019 OSA  04-2020, on Cpap MSK: DJD, spinal stenosis  CT November 2022, incidental findings:  -Cirrhosis . H/o heavy drinking quit ~ 1991, hep B&C negative - R adrenal adenoma, -COPD (h/o tobacco) - coronary calcifications Vitamin D  deficiency  Assessment & Plan HTN: Ambulatory BPs are great, Continue Losartan , Metoprolol , Hydrochlorothiazide .  Check BMP High cholesterol: Continue atorvastatin , check a lipid panel Obesity: On GLP-1's, managed elsewhere, doing great with lifestyle. Atrial fibrillation: Anticoagulated, seems to be tolerating well, check a CBC. L inguinal hernia: Noted few months ago about, seen by surgery November 2025, exam showed possibly hernia, recommended observation the patient was asymptomatic Liver disease: Sees GI, LFTs: PT/INR October 2024 WNL. Preventive care: Had a flu shot and a COVID-vaccine, saw GI, no colonoscopy was recommended. RTC 6 months    "

## 2024-08-15 NOTE — Assessment & Plan Note (Signed)
 HTN: Ambulatory BPs are great, Continue Losartan , Metoprolol , Hydrochlorothiazide .  Check BMP High cholesterol: Continue atorvastatin , check a lipid panel Obesity: On GLP-1's, managed elsewhere, doing great with lifestyle. Atrial fibrillation: Anticoagulated, seems to be tolerating well, check a CBC. L inguinal hernia: Noted few months ago about, seen by surgery November 2025, exam showed possibly hernia, recommended observation the patient was asymptomatic Liver disease: Sees GI, LFTs: PT/INR October 2024 WNL. Preventive care: Had a flu shot and a COVID-vaccine, saw GI, no colonoscopy was recommended. RTC 6 months

## 2024-08-15 NOTE — Patient Instructions (Signed)
 Please read your instructions carefully.   GO TO THE LAB :  Get the blood work    Go to the front desk for the checkout Please make an appointment for a checkup in 6 months  Continue checking your blood pressure regularly Blood pressure goal:  between 110/65 and  130/80. If it is consistently higher or lower, let me know     YOUR PLAN: ESSENTIAL HYPERTENSION: Your blood pressure is well-controlled with your current medications. -Continue taking Losartan , Metoprolol , and Hydrochlorothiazide  as prescribed.  HYPERLIPIDEMIA: Your cholesterol levels need routine monitoring. -A lipid panel has been ordered to check your cholesterol levels.  OBESITY: You are actively engaged in weight loss through diet and exercise. -Continue your current exercise regimen and dietary modifications.

## 2024-08-17 ENCOUNTER — Ambulatory Visit: Payer: Self-pay | Admitting: Internal Medicine

## 2024-08-17 LAB — DRUG MONITORING PANEL 375977 , URINE
Alcohol Metabolites: NEGATIVE ng/mL
Amphetamines: NEGATIVE ng/mL
Barbiturates: NEGATIVE ng/mL
Benzodiazepines: NEGATIVE ng/mL
Cocaine Metabolite: NEGATIVE ng/mL
Desmethyltramadol: NEGATIVE ng/mL
Marijuana Metabolite: NEGATIVE ng/mL
Opiates: NEGATIVE ng/mL
Oxycodone: NEGATIVE ng/mL
Tramadol: NEGATIVE ng/mL

## 2024-08-17 LAB — DM TEMPLATE

## 2024-08-23 ENCOUNTER — Ambulatory Visit: Admitting: Psychology

## 2024-08-23 DIAGNOSIS — F4322 Adjustment disorder with anxiety: Secondary | ICD-10-CM | POA: Diagnosis not present

## 2024-08-23 NOTE — Progress Notes (Signed)
 "  [facility] Counselor/Therapist Progress Note  Patient ID: Gregory Wilkins, MRN: [id number],    Date: 08/23/2024  Time Spent: 9:00am-9:50am  50 minutes   Treatment Type: Individual Therapy  Reported Symptoms: stress, frustration  Mental Status Exam: Appearance:  Casual     Behavior: Appropriate  Motor: Normal  Speech/Language:  Normal Rate  Affect: Appropriate  Mood: normal  Thought process: normal  Thought content:   WNL  Sensory/Perceptual disturbances:   WNL  Orientation: oriented to person, place, time/date, and situation  Attention: Good  Concentration: Good  Memory: WNL  Fund of knowledge:  Good  Insight:   Good  Judgment:  Good  Impulse Control: Good   Risk Assessment: Danger to Self:  No Self-injurious Behavior: No Danger to Others: No Duty to Warn:no Physical Aggression / Violence:No  Access to Firearms a concern: No  Gang Involvement:No   Subjective: Pt present for face-to-face individual therapy via video.  Pt consents to telehealth video session and is aware of limitations and benefits of virtual sessions.  Location of pt: home Location of therapist: home office.   Pt talked about his family.  Pt's son Caprice is using drugs again.  Pt feels very disappointed.  Pt and Oak have had a lot of tense interactions.  Pt's wife [name] is very critical of pt and that upsets him.  Helped pt process his feelings and family dynamics.  Pt talked about having a hard time letting go of things.   Worked with pt on the process of letting go of the things that do not serve him well.   Pt talked about walking with the [employer].  It was an inspiring experience for him.  Worked on self care strategies.  Pt does a lot of yoga and walking.  Provided supportive therapy.  Interventions: Cognitive Behavioral Therapy and Insight-Oriented  Diagnosis:  F43.22  Plan of Care: Pt participated in setting treatment goals.   He is progressing toward goals.  Pt   Plan to continue to meet every two weeks. Pt agrees with treatment plan.  Treatment Plan  (Treatment Plan Target Date 11/17/2024) Client Abilities/Strengths  Pt is bright, engaging and motivated for therapy.  Client Treatment Preferences  Individual therapy.  Client Statement of Needs  Improve coping skills.  Symptoms  Autonomic hyperactivity (e.g., palpitations, shortness of breath, dry mouth, trouble swallowing, nausea, diarrhea). Excessive and/or unrealistic worry that is difficult to control occurring more days than not for at least 6 months about a number of events or activities. Hypervigilance (e.g., feeling constantly on edge, experiencing concentration difficulties, having trouble falling or staying asleep, exhibiting a general state of irritability). Motor tension (e.g., restlessness, tiredness, shakiness, muscle tension). Problems Addressed  Anxiety Goals 1. Enhance ability to effectively cope with the full variety of life's worries and anxieties. 2. Learn and implement coping skills that result in a reduction of anxiety and worry, and improved daily functioning. Objective Learn to accept limitations in life and commit to tolerating, rather than avoiding, unpleasant emotions while accomplishing meaningful goals. Target Date: 2024-11-17 Frequency: Biweekly Progress: 65 Modality: individual Related Interventions 1. Use techniques from Acceptance and Commitment Therapy to help client accept uncomfortable realities such as lack of complete control, imperfections, and uncertainty and tolerate unpleasant emotions and thoughts in order to accomplish value-consistent goals. Objective Learn and implement problem-solving strategies for realistically addressing worries. Target Date: 2024-11-17 Frequency: Biweekly Progress: 65 Modality: individual Related Interventions 1. Assign the client a homework exercise  in which he/she problem-solves a current problem.  review, reinforce success, and  provide corrective feedback toward improvement. 2. Teach the client problem-solving strategies involving specifically defining a problem, generating options for addressing it, evaluating the pros and cons of each option, selecting and implementing an optional action, and reevaluating and refining the action. Objective Learn and implement calming skills to reduce overall anxiety and manage anxiety symptoms. Target Date: 2024-11-17 Frequency: Biweekly Progress: 65 Modality: individual Related Interventions 1. Assign the client to read about progressive muscle relaxation and other calming strategies in relevant books or treatment manuals (e.g., Progressive Relaxation Training by Thornell and Elmer; Mastery of Your Anxiety and Worry: Workbook by Richarda armin Given). 2. Assign the client homework each session in which he/she practices relaxation exercises daily, gradually applying them progressively from non-anxiety-provoking to anxiety-provoking situations; review and reinforce success while providing corrective feedback toward improvement. 3. Teach the client calming/relaxation skills (e.g., applied relaxation, progressive muscle relaxation, cue controlled relaxation; mindful breathing; biofeedback) and how to discriminate better between relaxation and tension; teach the client how to apply these skills to his/her daily life. 3. Reduce overall frequency, intensity, and duration of the anxiety so that daily functioning is not impaired. 4. Resolve the core conflict that is the source of anxiety. 5. Stabilize anxiety level while increasing ability to function on a daily basis. Diagnosis :    F43.22 Conditions For Discharge Achievement of treatment goals and objectives.   Gabriela Irigoyen, LCSW   "

## 2024-08-24 ENCOUNTER — Ambulatory Visit: Admitting: Psychology

## 2024-09-06 ENCOUNTER — Ambulatory Visit: Admitting: Psychology

## 2024-09-20 ENCOUNTER — Ambulatory Visit: Admitting: Psychology

## 2024-09-21 ENCOUNTER — Ambulatory Visit: Admitting: Psychology

## 2024-10-04 ENCOUNTER — Ambulatory Visit: Admitting: Psychology

## 2024-10-18 ENCOUNTER — Ambulatory Visit: Admitting: Psychology

## 2024-12-29 ENCOUNTER — Ambulatory Visit

## 2025-02-13 ENCOUNTER — Ambulatory Visit: Admitting: Internal Medicine
